# Patient Record
Sex: Female | Born: 1950 | Race: Black or African American | Hispanic: No | Smoking: Never smoker
Health system: Southern US, Community
[De-identification: ages and names within clinical notes are randomized; demographics above are authoritative.]

## PROBLEM LIST (undated history)

## (undated) DIAGNOSIS — E119 Type 2 diabetes mellitus without complications: Secondary | ICD-10-CM

## (undated) DIAGNOSIS — Z972 Presence of dental prosthetic device (complete) (partial): Secondary | ICD-10-CM

## (undated) DIAGNOSIS — M1990 Unspecified osteoarthritis, unspecified site: Secondary | ICD-10-CM

## (undated) DIAGNOSIS — K219 Gastro-esophageal reflux disease without esophagitis: Secondary | ICD-10-CM

## (undated) DIAGNOSIS — I10 Essential (primary) hypertension: Secondary | ICD-10-CM

## (undated) DIAGNOSIS — E039 Hypothyroidism, unspecified: Secondary | ICD-10-CM

## (undated) DIAGNOSIS — Z973 Presence of spectacles and contact lenses: Secondary | ICD-10-CM

## (undated) DIAGNOSIS — IMO0001 Reserved for inherently not codable concepts without codable children: Secondary | ICD-10-CM

## (undated) DIAGNOSIS — Z87442 Personal history of urinary calculi: Secondary | ICD-10-CM

## (undated) DIAGNOSIS — E785 Hyperlipidemia, unspecified: Secondary | ICD-10-CM

## (undated) HISTORY — DX: Hypothyroidism, unspecified: E03.9

## (undated) HISTORY — DX: Gastro-esophageal reflux disease without esophagitis: K21.9

## (undated) HISTORY — PX: JOINT REPLACEMENT: SHX530

## (undated) HISTORY — DX: Essential (primary) hypertension: I10

## (undated) HISTORY — PX: POLYPECTOMY: SHX149

## (undated) HISTORY — DX: Hyperlipidemia, unspecified: E78.5

## (undated) HISTORY — DX: Unspecified osteoarthritis, unspecified site: M19.90

## (undated) HISTORY — PX: COLONOSCOPY: SHX174

## (undated) HISTORY — PX: THYROID LOBECTOMY: SHX420

## (undated) HISTORY — DX: Reserved for inherently not codable concepts without codable children: IMO0001

---

## 1997-07-30 ENCOUNTER — Other Ambulatory Visit: Admission: RE | Admit: 1997-07-30 | Discharge: 1997-07-30 | Payer: Self-pay | Admitting: Obstetrics and Gynecology

## 1997-08-08 ENCOUNTER — Other Ambulatory Visit: Admission: RE | Admit: 1997-08-08 | Discharge: 1997-08-08 | Payer: Self-pay | Admitting: Obstetrics and Gynecology

## 1997-12-09 ENCOUNTER — Inpatient Hospital Stay (HOSPITAL_COMMUNITY): Admission: RE | Admit: 1997-12-09 | Discharge: 1997-12-12 | Payer: Self-pay | Admitting: Obstetrics and Gynecology

## 1998-01-04 ENCOUNTER — Inpatient Hospital Stay (HOSPITAL_COMMUNITY): Admission: AD | Admit: 1998-01-04 | Discharge: 1998-01-04 | Payer: Self-pay | Admitting: *Deleted

## 1998-03-14 HISTORY — PX: ABDOMINAL HYSTERECTOMY: SHX81

## 1998-09-16 ENCOUNTER — Other Ambulatory Visit: Admission: RE | Admit: 1998-09-16 | Discharge: 1998-09-16 | Payer: Self-pay | Admitting: Obstetrics and Gynecology

## 2000-12-04 ENCOUNTER — Other Ambulatory Visit: Admission: RE | Admit: 2000-12-04 | Discharge: 2000-12-04 | Payer: Self-pay | Admitting: Obstetrics and Gynecology

## 2002-03-14 HISTORY — PX: LITHOTRIPSY: SUR834

## 2002-05-27 ENCOUNTER — Encounter: Payer: Self-pay | Admitting: Internal Medicine

## 2002-07-06 ENCOUNTER — Emergency Department (HOSPITAL_COMMUNITY): Admission: EM | Admit: 2002-07-06 | Discharge: 2002-07-06 | Payer: Self-pay | Admitting: Emergency Medicine

## 2002-07-06 ENCOUNTER — Encounter: Payer: Self-pay | Admitting: Emergency Medicine

## 2002-08-05 ENCOUNTER — Ambulatory Visit (HOSPITAL_BASED_OUTPATIENT_CLINIC_OR_DEPARTMENT_OTHER): Admission: RE | Admit: 2002-08-05 | Discharge: 2002-08-05 | Payer: Self-pay | Admitting: Urology

## 2002-08-12 ENCOUNTER — Emergency Department (HOSPITAL_COMMUNITY): Admission: EM | Admit: 2002-08-12 | Discharge: 2002-08-12 | Payer: Self-pay | Admitting: *Deleted

## 2002-10-03 ENCOUNTER — Ambulatory Visit (HOSPITAL_BASED_OUTPATIENT_CLINIC_OR_DEPARTMENT_OTHER): Admission: RE | Admit: 2002-10-03 | Discharge: 2002-10-03 | Payer: Self-pay | Admitting: Urology

## 2004-01-19 ENCOUNTER — Ambulatory Visit: Payer: Self-pay | Admitting: Endocrinology

## 2004-01-20 ENCOUNTER — Ambulatory Visit (HOSPITAL_COMMUNITY): Admission: RE | Admit: 2004-01-20 | Discharge: 2004-01-20 | Payer: Self-pay | Admitting: Endocrinology

## 2004-01-23 ENCOUNTER — Encounter (INDEPENDENT_AMBULATORY_CARE_PROVIDER_SITE_OTHER): Payer: Self-pay | Admitting: Specialist

## 2004-01-23 ENCOUNTER — Ambulatory Visit (HOSPITAL_COMMUNITY): Admission: RE | Admit: 2004-01-23 | Discharge: 2004-01-23 | Payer: Self-pay | Admitting: Endocrinology

## 2004-03-14 HISTORY — PX: THYROIDECTOMY: SHX17

## 2004-03-16 ENCOUNTER — Ambulatory Visit (HOSPITAL_COMMUNITY): Admission: RE | Admit: 2004-03-16 | Discharge: 2004-03-16 | Payer: Self-pay | Admitting: Endocrinology

## 2004-12-16 ENCOUNTER — Ambulatory Visit: Payer: Self-pay | Admitting: Internal Medicine

## 2005-06-14 ENCOUNTER — Emergency Department (HOSPITAL_COMMUNITY): Admission: EM | Admit: 2005-06-14 | Discharge: 2005-06-14 | Payer: Self-pay | Admitting: Emergency Medicine

## 2005-06-16 ENCOUNTER — Ambulatory Visit (HOSPITAL_COMMUNITY): Admission: AD | Admit: 2005-06-16 | Discharge: 2005-06-16 | Payer: Self-pay | Admitting: Urology

## 2006-02-28 ENCOUNTER — Ambulatory Visit: Payer: Self-pay | Admitting: Internal Medicine

## 2006-08-29 ENCOUNTER — Encounter: Payer: Self-pay | Admitting: Internal Medicine

## 2006-09-18 ENCOUNTER — Encounter (INDEPENDENT_AMBULATORY_CARE_PROVIDER_SITE_OTHER): Payer: Self-pay | Admitting: *Deleted

## 2006-09-25 ENCOUNTER — Telehealth: Payer: Self-pay | Admitting: Internal Medicine

## 2007-02-20 ENCOUNTER — Telehealth: Payer: Self-pay | Admitting: Internal Medicine

## 2007-02-21 ENCOUNTER — Encounter (INDEPENDENT_AMBULATORY_CARE_PROVIDER_SITE_OTHER): Payer: Self-pay | Admitting: *Deleted

## 2007-04-13 ENCOUNTER — Ambulatory Visit: Payer: Self-pay | Admitting: Internal Medicine

## 2007-04-13 DIAGNOSIS — E039 Hypothyroidism, unspecified: Secondary | ICD-10-CM

## 2007-04-13 DIAGNOSIS — G4700 Insomnia, unspecified: Secondary | ICD-10-CM

## 2007-04-13 DIAGNOSIS — M1990 Unspecified osteoarthritis, unspecified site: Secondary | ICD-10-CM | POA: Insufficient documentation

## 2007-04-17 ENCOUNTER — Encounter: Payer: Self-pay | Admitting: Internal Medicine

## 2007-04-19 LAB — CONVERTED CEMR LAB
AST: 19 units/L (ref 0–37)
Albumin: 3.6 g/dL (ref 3.5–5.2)
Alkaline Phosphatase: 72 units/L (ref 39–117)
Basophils Relative: 0.8 % (ref 0.0–1.0)
Chloride: 103 meq/L (ref 96–112)
Cholesterol: 215 mg/dL (ref 0–200)
Direct LDL: 150 mg/dL
Lymphocytes Relative: 40.9 % (ref 12.0–46.0)
MCV: 89 fL (ref 78.0–100.0)
Monocytes Absolute: 0.5 10*3/uL (ref 0.2–0.7)
Neutro Abs: 2.1 10*3/uL (ref 1.4–7.7)
RBC: 3.95 M/uL (ref 3.87–5.11)
RDW: 11.8 % (ref 11.5–14.6)
Sodium: 138 meq/L (ref 135–145)
TSH: 0.63 microintl units/mL (ref 0.35–5.50)
VLDL: 14 mg/dL (ref 0–40)
WBC: 4.8 10*3/uL (ref 4.5–10.5)

## 2007-08-28 ENCOUNTER — Emergency Department (HOSPITAL_COMMUNITY): Admission: EM | Admit: 2007-08-28 | Discharge: 2007-08-29 | Payer: Self-pay | Admitting: Emergency Medicine

## 2007-08-29 ENCOUNTER — Telehealth: Payer: Self-pay | Admitting: Internal Medicine

## 2007-08-30 ENCOUNTER — Telehealth: Payer: Self-pay | Admitting: Internal Medicine

## 2007-09-06 ENCOUNTER — Ambulatory Visit: Payer: Self-pay | Admitting: Internal Medicine

## 2007-09-12 ENCOUNTER — Telehealth: Payer: Self-pay | Admitting: Internal Medicine

## 2007-10-08 ENCOUNTER — Encounter: Payer: Self-pay | Admitting: Internal Medicine

## 2008-01-14 ENCOUNTER — Telehealth (INDEPENDENT_AMBULATORY_CARE_PROVIDER_SITE_OTHER): Payer: Self-pay | Admitting: *Deleted

## 2008-02-12 ENCOUNTER — Ambulatory Visit: Payer: Self-pay | Admitting: Internal Medicine

## 2008-02-13 ENCOUNTER — Telehealth (INDEPENDENT_AMBULATORY_CARE_PROVIDER_SITE_OTHER): Payer: Self-pay | Admitting: *Deleted

## 2008-02-18 ENCOUNTER — Ambulatory Visit: Payer: Self-pay | Admitting: Family Medicine

## 2008-02-18 ENCOUNTER — Encounter: Payer: Self-pay | Admitting: Internal Medicine

## 2008-02-23 ENCOUNTER — Ambulatory Visit: Payer: Self-pay | Admitting: Internal Medicine

## 2008-02-23 ENCOUNTER — Inpatient Hospital Stay (HOSPITAL_COMMUNITY): Admission: EM | Admit: 2008-02-23 | Discharge: 2008-02-25 | Payer: Self-pay | Admitting: Emergency Medicine

## 2008-02-23 ENCOUNTER — Telehealth: Payer: Self-pay | Admitting: Family Medicine

## 2008-02-23 ENCOUNTER — Ambulatory Visit: Payer: Self-pay | Admitting: Family Medicine

## 2008-02-25 ENCOUNTER — Encounter: Payer: Self-pay | Admitting: Internal Medicine

## 2008-02-26 ENCOUNTER — Telehealth (INDEPENDENT_AMBULATORY_CARE_PROVIDER_SITE_OTHER): Payer: Self-pay | Admitting: *Deleted

## 2008-02-27 ENCOUNTER — Encounter (INDEPENDENT_AMBULATORY_CARE_PROVIDER_SITE_OTHER): Payer: Self-pay | Admitting: *Deleted

## 2008-02-27 ENCOUNTER — Ambulatory Visit: Payer: Self-pay | Admitting: Internal Medicine

## 2008-02-27 DIAGNOSIS — R9389 Abnormal findings on diagnostic imaging of other specified body structures: Secondary | ICD-10-CM | POA: Insufficient documentation

## 2008-02-27 DIAGNOSIS — I10 Essential (primary) hypertension: Secondary | ICD-10-CM

## 2008-02-29 ENCOUNTER — Telehealth (INDEPENDENT_AMBULATORY_CARE_PROVIDER_SITE_OTHER): Payer: Self-pay | Admitting: *Deleted

## 2008-02-29 LAB — CONVERTED CEMR LAB
BUN: 22 mg/dL (ref 6–23)
Creatinine, Ser: 1.2 mg/dL (ref 0.4–1.2)
GFR calc non Af Amer: 49 mL/min
Sodium: 139 meq/L (ref 135–145)

## 2008-06-06 ENCOUNTER — Telehealth: Payer: Self-pay | Admitting: Internal Medicine

## 2008-06-17 ENCOUNTER — Telehealth (INDEPENDENT_AMBULATORY_CARE_PROVIDER_SITE_OTHER): Payer: Self-pay | Admitting: *Deleted

## 2008-10-08 ENCOUNTER — Encounter: Payer: Self-pay | Admitting: Internal Medicine

## 2008-10-29 ENCOUNTER — Encounter (INDEPENDENT_AMBULATORY_CARE_PROVIDER_SITE_OTHER): Payer: Self-pay | Admitting: *Deleted

## 2009-02-18 ENCOUNTER — Encounter: Payer: Self-pay | Admitting: Internal Medicine

## 2009-02-24 ENCOUNTER — Telehealth (INDEPENDENT_AMBULATORY_CARE_PROVIDER_SITE_OTHER): Payer: Self-pay | Admitting: *Deleted

## 2009-03-03 ENCOUNTER — Encounter: Payer: Self-pay | Admitting: Internal Medicine

## 2009-08-03 ENCOUNTER — Telehealth (INDEPENDENT_AMBULATORY_CARE_PROVIDER_SITE_OTHER): Payer: Self-pay | Admitting: *Deleted

## 2009-08-06 ENCOUNTER — Telehealth (INDEPENDENT_AMBULATORY_CARE_PROVIDER_SITE_OTHER): Payer: Self-pay | Admitting: *Deleted

## 2009-09-01 ENCOUNTER — Ambulatory Visit: Payer: Self-pay | Admitting: Internal Medicine

## 2009-09-02 ENCOUNTER — Encounter (INDEPENDENT_AMBULATORY_CARE_PROVIDER_SITE_OTHER): Payer: Self-pay | Admitting: *Deleted

## 2009-09-08 LAB — CONVERTED CEMR LAB
AST: 23 units/L (ref 0–37)
BUN: 14 mg/dL (ref 6–23)
Basophils Relative: 1.7 % (ref 0.0–3.0)
Calcium: 9.3 mg/dL (ref 8.4–10.5)
Direct LDL: 171.3 mg/dL
Glucose, Bld: 82 mg/dL (ref 70–99)
HCT: 34.4 % — ABNORMAL LOW (ref 36.0–46.0)
HDL: 52.2 mg/dL (ref >39.00)
Lymphocytes Relative: 45.2 % (ref 12.0–46.0)
MCHC: 34.1 g/dL (ref 30.0–36.0)
MCV: 92 fL (ref 78.0–100.0)
Monocytes Absolute: 0.4 10*3/uL (ref 0.1–1.0)
Neutrophils Relative %: 40.9 % — ABNORMAL LOW (ref 43.0–77.0)
Potassium: 4.2 meq/L (ref 3.5–5.1)
RDW: 13.4 % (ref 11.5–14.6)
Sodium: 142 meq/L (ref 135–145)
Total CHOL/HDL Ratio: 4
Triglycerides: 96 mg/dL (ref 0.0–149.0)

## 2009-10-13 ENCOUNTER — Encounter: Payer: Self-pay | Admitting: Internal Medicine

## 2009-10-14 ENCOUNTER — Encounter (INDEPENDENT_AMBULATORY_CARE_PROVIDER_SITE_OTHER): Payer: Self-pay | Admitting: *Deleted

## 2009-10-19 ENCOUNTER — Ambulatory Visit: Payer: Self-pay | Admitting: Internal Medicine

## 2009-10-21 ENCOUNTER — Ambulatory Visit: Payer: Self-pay | Admitting: Internal Medicine

## 2009-10-23 ENCOUNTER — Encounter: Payer: Self-pay | Admitting: Internal Medicine

## 2009-10-23 LAB — CONVERTED CEMR LAB
ALT: 35 units/L (ref 0–35)
AST: 23 units/L (ref 0–37)
Total CHOL/HDL Ratio: 4
VLDL: 20 mg/dL (ref 0.0–40.0)

## 2009-10-30 ENCOUNTER — Ambulatory Visit: Payer: Self-pay | Admitting: Internal Medicine

## 2010-03-12 ENCOUNTER — Telehealth: Payer: Self-pay | Admitting: Internal Medicine

## 2010-04-03 ENCOUNTER — Encounter: Payer: Self-pay | Admitting: Internal Medicine

## 2010-04-04 ENCOUNTER — Encounter: Payer: Self-pay | Admitting: Urology

## 2010-04-13 NOTE — Letter (Signed)
Summary: [facility] [facility] Form/[facility]   Imported By: Lanelle Bal 11/02/2009 10:01:36  _____________________________________________________________________  External Attachment:    Type:   Image     Comment:   External Document

## 2010-04-13 NOTE — Miscellaneous (Signed)
Summary: recall colon--ch.  Clinical Lists Changes  Medications: Added new medication of MOVIPREP 100 GM  SOLR (PEG-KCL-NACL-NASULF-NA ASC-C) As directed - Signed Rx of MOVIPREP 100 GM  SOLR (PEG-KCL-NACL-NASULF-NA ASC-C) As directed;  #1 x 0;  Signed;  Entered by: Clide Cliff RN;  Authorized by: Hilarie Fredrickson MD;  Method used: Electronically to [facility] [address] [place], [place], [place]  [address], Ph: [phone], Fax: [phone] Observations: Added new observation of ALLERGY REV: Done (10/19/2009 8:46)    Prescriptions: MOVIPREP 100 GM  SOLR (PEG-KCL-NACL-NASULF-NA ASC-C) As directed  #1 x 0   Entered by:   Clide Cliff RN   Authorized by:   Hilarie Fredrickson MD   Signed by:   Clide Cliff RN on 10/19/2009   Method used:   Electronically to        [facility] (retail)       [address]       [place], [place]  [address]       Ph: [phone]       Fax: [phone]   RxID:   [id number]

## 2010-04-13 NOTE — Procedures (Signed)
Summary: Colonoscopy  Patient: Kelly Perkins Note: All result statuses are Final unless otherwise noted.  Tests: (1) Colonoscopy (COL)   COL Colonoscopy           DONE     [facility]     [address]     [place], [place]  [address]           COLONOSCOPY PROCEDURE REPORT           PATIENT:  [name], [name]  MR#:  [id number]     BIRTHDATE:  1950/06/19, 58 yrs. old  GENDER:  female     ENDOSCOPIST:  Wilhemina Bonito. Eda Keys, MD     REF. BY:  Surveillance Program Recall     PROCEDURE DATE:  10/30/2009     PROCEDURE:  Surveillance Colonoscopy     ASA CLASS:  Class II     INDICATIONS:  history of pre-cancerous (adenomatous) colon polyps,     surveillance and high-risk screening ; INDEX EXAM 11-2002 W/ SMALL     ADENOMA AND FAIR PREP. OVERDUE FOR FOLLOW UP     MEDICATIONS:   Fentanyl 100 mcg IV, Versed 10 mg IV           DESCRIPTION OF PROCEDURE:   After the risks benefits and     alternatives of the procedure were thoroughly explained, informed     consent was obtained.  Digital rectal exam was performed and     revealed no abnormalities.   The LB CF-[id number] endoscope     was introduced through the anus and advanced to the cecum, which     was identified by both the appendix and ileocecal valve, limited     by poor preparation. TIME TO CECUM = 4:16 MIN.   The quality of     the prep was poor, using MoviPrep.  The instrument was then slowly     withdrawn (T=7:14 MIN) as the colon was fully examined.     <<PROCEDUREIMAGES>>           FINDINGS:  The exam was complete to the cecal tip but Poor prep     limited this examination.The colon was filled with dense turbid     efluent and vegetative material. No bulky tumors seen. can not r/o     anything else.   Retroflexed views in the rectum revealed internal     hemorrhoids.    The scope was then withdrawn from the patient and     the procedure completed.           COMPLICATIONS:  None           ENDOSCOPIC IMPRESSION:     1) Poor  prep (exam limited as described)     2) Internal hemorrhoids           RECOMMENDATIONS:     1) Follow up colonoscopy WITHIN ONE YEAR WITH MORE VIGOROUS     PREP. RECOMMEND 2 DAYS OF CLEAR LIQUIDS ALONG WITH ONE BOTTLE OF     MAG CITRATE THE MORNING THE DAY BEFORE THE EXAM TO BE FOLLOWED BY     STANDARD MOVI PREP           ______________________________     Wilhemina Bonito. Eda Keys, MD           CC:  Willow Ora, MD; The Patient           n.     eSIGNED:   Wilhemina Bonito. Marina Goodell  Jr at 10/30/2009 10:13 AM           Kelly Perkins, [id number]  Note: An exclamation mark (!) indicates a result that was not dispersed into the flowsheet. Document Creation Date: 10/30/2009 10:14 AM _______________________________________________________________________  (1) Order result status: Final Collection or observation date-time: 10/30/2009 10:01 Requested date-time:  Receipt date-time:  Reported date-time:  Referring Physician:   Ordering Physician: Fransico Setters [phone]) Specimen Source:  Source: Launa Grill Order Number: [id number] Lab site:   Appended Document: Colonoscopy    Clinical Lists Changes  Observations: Added new observation of COLONNXTDUE: 10/2010 (10/30/2009 13:42)

## 2010-04-13 NOTE — Progress Notes (Signed)
Summary: Refill Request//Ambien  Phone Note Refill Request Call back at Work Phone [phone] Message from:  Pharmacy on Aug 06, 2009 10:23 AM  Refills Requested: Medication #1:  AMBIEN 10 MG TABS 1 by mouth qhs [facility] [address]. Patient does have a cpx scheduled on 6.21.11 now.   Next Appointment Scheduled: 6.21.11 Initial call taken by: Harold Barban,  Aug 06, 2009 10:24 AM  Follow-up for Phone Call        last refill #90 x 0 on 02/25/09,  last ov 02/27/08. Pt has appt sched 09/01/09 .Kandice Hams  Aug 07, 2009 10:20 AM  Follow-up by: Kandice Hams,  Aug 07, 2009 10:20 AM  Additional Follow-up for Phone Call Additional follow up Details #1::        patient is going out of town - needs refill today - please call when called in Additional Follow-up by: Okey Regal Spring,  Aug 07, 2009 11:26 AM    Additional Follow-up for Phone Call Additional follow up Details #2::    ok 90, no RF Jose E. Paz MD  Aug 07, 2009 3:08 PM   RX FAXED LEFT MSG FOR PATIENT.Kandice Hams  Aug 07, 2009 3:17 PM  Follow-up by: Kandice Hams,  Aug 07, 2009 3:17 PM  Prescriptions: AMBIEN 10 MG TABS (ZOLPIDEM TARTRATE) 1 by mouth qhs  #90 x 0   Entered by:   Kandice Hams   Authorized by:   Nolon Rod. Paz MD   Signed by:   Kandice Hams on 08/07/2009   Method used:   Printed then faxed to ...       [facility] (retail)       [address]       [place], [place]  [address]       Ph: [phone]       Fax: [phone]   RxID:   [id number]

## 2010-04-13 NOTE — Progress Notes (Signed)
Summary: CPX due   Phone Note Refill Request Message from:  Fax from Pharmacy on Aug 03, 2009 11:49 AM  Refills Requested: Medication #1:  AMBIEN 10 MG TABS 1 by mouth qhs [facility] [address] - fax [phone] - phone [phone]   Method Requested: Fax to Local Pharmacy Initial call taken by: Okey Regal Spring,  Aug 03, 2009 11:50 AM  Follow-up for Phone Call        last ov 02/27/2008, was rx'd #90 with no refills on 02/25/09 RX DENIED Marisue Ivan, pls contact pt to schedule cpx .Marland KitchenMarland KitchenShary Decamp  Aug 03, 2009 2:29 PM   Additional Follow-up for Phone Call Additional follow up Details #1::        [email or web address].Harold Barban  Aug 03, 2009 3:18 PM    Additional Follow-up for Phone Call Additional follow up Details #2::    Patient has an appt on 6.21.11 Follow-up by: Harold Barban,  Aug 04, 2009 11:21 AM

## 2010-04-13 NOTE — Assessment & Plan Note (Signed)
Summary: cpx//lch   Vital Signs:  Patient profile:   60 year old female Height:      63 inches Weight:      223 pounds BMI:     39.65 Temp:     98.0 degrees F oral Pulse rate:   66 / minute BP sitting:   118 / 90  (left arm)  Vitals Entered By: Jeremy Johann CMA (September 01, 2009 10:24 AM) CC: cpx Comments --fasting --no pap REVIEWED MED LIST, PATIENT AGREED DOSE AND INSTRUCTION CORRECT    History of Present Illness: CPX c/o pain in the left knee and hip, also in the hands. No redness or swelling in the hand joints, no a.m. stiffness. Celebrex use to help, refill? Ambien works, needs a refill  Allergies (verified): No Known Drug Allergies  Past History:  Past Medical History: Reviewed history from 02/27/2008 and no changes required. G2 P2 urolthiasis 2004--lithotripsy Osteoarthritis Hypothyroidism,  sees endocrinology Hypertension  Past Surgical History: Hysterectomy, B Oophorectomy Thyroidectomy  Family History: Reviewed history from 04/13/2007 and no changes required. CAD - M onset? HTN - M, sister DM - no breast Ca - no colon ca - no stroke - no  Social History: Single mom is one of my patients [name] 2 kids tobacco--no ETOH-- socially  diet- "not good" exercise-- walks daily , 2 miles   Review of Systems General:  Denies fatigue, fever, and weight loss. CV:  Denies chest pain or discomfort, palpitations, and swelling of feet. Resp:  Denies cough and shortness of breath. GI:  Denies bloody stools, nausea, and vomiting. GU:  Denies dysuria and hematuria. Psych:  Denies anxiety and depression.  Physical Exam  General:  alert, well-developed, and overweight-appearing.   Neck:  no masses, no thyromegaly, and normal carotid upstroke.   Lungs:  normal respiratory effort, no intercostal retractions, no accessory muscle use, and normal breath sounds.   Heart:  normal rate, regular rhythm, and no murmur.   Abdomen:  soft, non-tender, normal  bowel sounds, no distention, no masses, no guarding, and no rigidity.   Extremities:  no edema Knee deformities consistent with OA Neurologic:  alert & oriented X3, strength normal in all extremities, and gait normal.   Psych:  Cognition and judgment appear intact. Alert and cooperative with normal attention span and concentration.  not anxious appearing and not depressed appearing.     Impression & Recommendations:  Problem # 1:  HEALTH SCREENING (ICD-V70.0)  Td 09 pt reports shee sees Gyn routinely, they f/u on DEXAs last MMG 7-10  Cscope 2004, Bx adenomatous polyp (Dr Marina Goodell) , was due for cscope 9-09---- referal  discussed her BMI, need for diet and exercise. Weight watchers? Optifast?    Orders: Venipuncture ([address]) TLB-BMP (Basic Metabolic Panel-BMET) (80048-METABOL) TLB-CBC Platelet - w/Differential (85025-CBCD) TLB-Lipid Panel (80061-LIPID) TLB-Hepatic/Liver Function Pnl (80076-HEPATIC) Gastroenterology Referral (GI)  Problem # 2:  OSTEOARTHRITIS (ICD-715.90) ok to refill Celebrex   Her updated medication list for this problem includes:    Celebrex 200 Mg Caps (Celecoxib) ..... One by mouth daily. take with food  Problem # 3:  INSOMNIA, CHRONIC (ICD-307.42) refill Ambien  Complete Medication List: 1)  Ambien 10 Mg Tabs (Zolpidem tartrate) .Marland Kitchen.. 1 by mouth qhs 2)  Synthroid 150 Mcg Tabs (Levothyroxine sodium) .... 2 by mouth once daily 3)  Celebrex 200 Mg Caps (Celecoxib) .... One by mouth daily. take with food  Patient Instructions: 1)  Please schedule a follow-up appointment in 1 year.  2)  take  Celebrex as needed for pain, take it with food, watch for stomach irritation Prescriptions: AMBIEN 10 MG TABS (ZOLPIDEM TARTRATE) 1 by mouth qhs  #90 x 1   Entered and Authorized by:   Elita Quick E. Jeffery Gammell MD   Signed by:   Nolon Rod. Caulder Wehner MD on 09/01/2009   Method used:   Print then Give to Patient   RxID:   [id number] CELEBREX 200 MG CAPS (CELECOXIB) one by mouth daily. Take  with food  #90 x 1   Entered and Authorized by:   Nolon Rod. Laylana Gerwig MD   Signed by:   Nolon Rod. Natha Guin MD on 09/01/2009   Method used:   Print then Give to Patient   RxID:   [id number]

## 2010-04-13 NOTE — Letter (Signed)
Summary: [facility] Instructions  [facility]  [address] [place], [place] [address]   Phone: [phone]  Fax: [phone]       CATHI HAZAN    1950-04-16    MRN: [id number]        Procedure Day Dorna Bloom:  Farrell Ours  10/30/09     Arrival Time:  8:00AM     Procedure Time:  9:00AM     Location of Procedure:                    [facility]ian[facility] Pa[facility]  [facility] (4th Floor)                        PREPARATION FOR COLONOSCOPY WITH MOVIPREP   Starting 5 days prior to your procedure 10/25/09 do not eat nuts, seeds, popcorn, corn, beans, peas,  salads, or any raw vegetables.  Do not take any fiber supplements (e.g. Metamucil, Citrucel, and Benefiber).  THE DAY BEFORE YOUR PROCEDURE         DATE: 10/29/09  DAY: THURSDAY  1.  Drink clear liquids the entire day-NO SOLID FOOD  2.  Do not drink anything colored red or purple.  Avoid juices with pulp.  No orange juice.  3.  Drink at least 64 oz. (8 glasses) of fluid/clear liquids during the day to prevent dehydration and help the prep work efficiently.  CLEAR LIQUIDS INCLUDE: Water Jello Ice Popsicles Tea (sugar ok, no milk/cream) Powdered fruit flavored drinks Coffee (sugar ok, no milk/cream) Gatorade Juice: apple, white grape, white cranberry  Lemonade Clear bullion, consomm, broth Carbonated beverages (any kind) Strained chicken noodle soup Hard Candy                             4.  In the morning, mix first dose of MoviPrep solution:    Empty 1 Pouch A and 1 Pouch B into the disposable container    Add lukewarm drinking water to the top line of the container. Mix to dissolve    Refrigerate (mixed solution should be used within 24 hrs)  5.  Begin drinking the prep at 5:00 p.m. The MoviPrep container is divided by 4 marks.   Every 15 minutes drink the solution down to the next mark (approximately 8 oz) until the full liter is complete.   6.  Follow completed prep with 16 oz of clear liquid of your choice (Nothing  red or purple).  Continue to drink clear liquids until bedtime.  7.  Before going to bed, mix second dose of MoviPrep solution:    Empty 1 Pouch A and 1 Pouch B into the disposable container    Add lukewarm drinking water to the top line of the container. Mix to dissolve    Refrigerate  THE DAY OF YOUR PROCEDURE      DATE: 10/30/09  DAY: FRIDAY  Beginning at 4:00AM (5 hours before procedure):         1. Every 15 minutes, drink the solution down to the next mark (approx 8 oz) until the full liter is complete.  2. Follow completed prep with 16 oz. of clear liquid of your choice.    3. You may drink clear liquids until 7:00AM (2 HOURS BEFORE PROCEDURE).   MEDICATION INSTRUCTIONS  Unless otherwise instructed, you should take regular prescription medications with a small sip of water   as early as possible the morning  of your procedure.           OTHER INSTRUCTIONS  You will need a responsible adult at least 62 years of age to accompany you and drive you home.   This person must remain in the waiting room during your procedure.  Wear loose fitting clothing that is easily removed.  Leave jewelry and other valuables at home.  However, you may wish to bring a book to read or  an iPod/MP3 player to listen to music as you wait for your procedure to start.  Remove all body piercing jewelry and leave at home.  Total time from sign-in until discharge is approximately 2-3 hours.  You should go home directly after your procedure and rest.  You can resume normal activities the  day after your procedure.  The day of your procedure you should not:   Drive   Make legal decisions   Operate machinery   Drink alcohol   Return to work  You will receive specific instructions about eating, activities and medications before you leave.    The above instructions have been reviewed and explained to me by   Clide Cliff, RN______________________    I fully understand and can  verbalize these instructions _____________________________ Date _________

## 2010-04-13 NOTE — Letter (Signed)
Summary: Previsit letter  [facility]  [address] [place], [place] [address]   Phone: [phone]  Fax: [phone]       09/02/2009 MRN: [id number]  [facility] [address] [place], [place]  [address]  Dear Ms. Renfroe,  Welcome to the Gastroenterology Division at [facility].    You are scheduled to see a nurse for your pre-procedure visit on 09-09-09 at 3:30p.m. on the 3rd floor at [facility], [address]  We ask that you try to arrive at our office 15 minutes prior to your appointment time to allow for check-in.  Your nurse visit will consist of discussing your medical and surgical history, your immediate family medical history, and your medications.    Please bring a complete list of all your medications or, if you prefer, bring the medication bottles and we will list them.  We will need to be aware of both prescribed and over the counter drugs.  We will need to know exact dosage information as well.  If you are on blood thinners (Coumadin, Plavix, Aggrenox, Ticlid, etc.) please call our office today/prior to your appointment, as we need to consult with your physician about holding your medication.   Please be prepared to read and sign documents such as consent forms, a financial agreement, and acknowledgement forms.  If necessary, and with your consent, a friend or relative is welcome to sit-in on the nurse visit with you.  Please bring your insurance card so that we may make a copy of it.  If your insurance requires a referral to see a specialist, please bring your referral form from your primary care physician.  No co-pay is required for this nurse visit.     If you cannot keep your appointment, please call [phone] to cancel or reschedule prior to your appointment date.  This allows [place] the opportunity to schedule an appointment for another patient in need of care.    Thank you for choosing [facility] for your medical  needs.  We appreciate the opportunity to care for you.  Please visit [place] at our website  to learn more about our practice.                     Sincerely.                                                                                                                   The Gastroenterology Division

## 2010-04-13 NOTE — Consult Note (Signed)
Summary: [facility]  [facility]   Imported By: Lanelle Bal 03/18/2009 13:15:35  _____________________________________________________________________  External Attachment:    Type:   Image     Comment:   External Document

## 2010-04-15 NOTE — Progress Notes (Signed)
Summary: Zolpidem refill  Phone Note Refill Request Message from:  Fax from Pharmacy on March 12, 2010 12:51 PM  Refills Requested: Medication #1:  AMBIEN 10 MG TABS 1 by mouth qhs   Last Refilled: 02/02/2010 [id number], [address] [place], [place]  phone- [phone], fax [phone]   qty = 90  Next Appointment Scheduled: none Initial call taken by: Jerolyn Shin,  March 12, 2010 12:51 PM  Follow-up for Phone Call        last filled 09/01/09 x 1. #90 Follow-up by: Army Fossa CMA,  March 12, 2010 2:15 PM  Additional Follow-up for Phone Call Additional follow up Details #1::        90 and 1 RF Jose E. Paz MD  March 12, 2010 3:55 PM     Prescriptions: AMBIEN 10 MG TABS (ZOLPIDEM TARTRATE) 1 by mouth qhs  #90 x 1   Entered by:   Army Fossa CMA   Authorized by:   Nolon Rod. Paz MD   Signed by:   Army Fossa CMA on 03/12/2010   Method used:   Printed then faxed to ...       [facility] (retail)       [address]       [place], [place]  [address]       Ph: [phone]       Fax: [phone]   RxID:   [id number]

## 2010-07-13 ENCOUNTER — Telehealth: Payer: Self-pay | Admitting: *Deleted

## 2010-07-13 NOTE — Telephone Encounter (Signed)
Prior auth for celebrex and has been approved.

## 2010-07-27 NOTE — Discharge Summary (Signed)
NAME:  Kelly Perkins, Kelly Perkins                 ACCOUNT NO.:  [id number]   MEDICAL RECORD NO.:  [id number]          PATIENT TYPE:  INP   LOCATION:  1418                         FACILITY:  [facility]   PHYSICIAN:  Valerie A. Felicity Coyer, MDDATE OF BIRTH:  16-Aug-1950   DATE OF ADMISSION:  02/23/2008  DATE OF DISCHARGE:  02/25/2008                               DISCHARGE SUMMARY   DISCHARGE DIAGNOSES:  1. Chest pain in the setting of uncontrolled hypertension, now      resolved.  No evidence of acute coronary syndrome or arrhythmia.  2. Possible mild CHF due to uncontrolled hypertension.  A 2-D echo      performed with results pending at time of dictation.  Outpatient      follow-up with primary MD now on low-dose diuretic see below.  3. Uncontrolled hypertension, improved with beta blocker diuretic      treatment.  Outpatient follow-up with PCP blood pressure at      discharge was 143/85.  4. Hypothyroid was elevated TSH 89.46 this admission, increased      Synthroid dose to 300 mcg daily.  Outpatient follow up of TFTs in 4-      6 weeks.  5. Episode of abdominal pain 12:13 p.m. etiology unclear.  CT abdomen      and pelvis negative with no chemical abnormalities on labs and      symptoms resolved.   DISCHARGE MEDICATIONS:  1. Systolic 10 mg p.o. daily, dispensed 31 one refill.  2. Synthroid 300 mcg, dispense 31 one p.o. daily one refill.  3. HCTZ 25 mg tablets one p.o. daily dispense 31 one refill.   HOSPITAL FOLLOW-UP:  Scheduled with PCP in next 48 hours, Dr. Willow Ora  for Wednesday December 16 at 2:40 p.m. the patient is instructed that  she may increase activity slowly but may hold off and return to work  until after she sees Dr. Drue Novel on Wednesday afternoon, as she lifts heavy  boxes at [employer], and believes this is contributing to her  initial pain symptoms.  She is also instructed Dr. Drue Novel will review  results of electrocardiogram at the next visit and requested to bring  all of her  medications to visit with Dr. Drue Novel for review and to take as  prescribed.   CONDITION ON DISCHARGE:  Medically improved and stable, asymptomatic and  hemodynamically controlled.   HOSPITAL COURSE:  1. Chest pain with uncontrolled hypertension.  The patient is a 60-      year-old woman, not previously on antihypertensive medications by      her report, presented to the emergency room with 1-2 weeks of      standard substernal chest pain both with exertion and at rest.  In      the emergency room, this was associated with systolic blood      pressure greater than 200 and question of mild CHF by clinical      review.  She was admitted for telemetry observation, cardiac      cycling with serial enzymes and further observation.  She was  treated with IV Lopressor and also begun on HCTZ diuretics.  Course      by serial cardiac enzymes were negative for acute coronary      ischemia, and her symptoms resolved.  However, her blood pressure      remained uncontrolled with systolics in the 180s.  She was also      found to have abnormality of her TSH despite the fact that she was      noncompliant.  Please see details below.  Lopressor was changed to      oral Bystolic, and a 2-D echo was ordered for evaluation of      possible CHF in the setting of uncontrolled hypertension.      Echocardiogram will be done on this day prior to admission as she      clinically does not have pulmonary edema or peripheral symptoms I      feel it is safe to continue home antihypertensive regimen as      ordered herewith Bystolic and HCTZ with close outpatient follow-up      in the next 48 hours to review these results.  She has had no      further chest pain or palpitations but did experience an episode of      unexplained severe abdominal pain.  For admission for which a CT      abdomen and pelvis were ordered and negative.  CBC was normal at      4.9 and hemoglobin at this time was 14.0.  Hemodynamics were  not      compromised.  __________  symptoms at this time.  2. Hypothyroidism with elevated TSH.  The patient reports compliance      with 2 tablets of Synthroid 112 mcg daily which she has also been      __________increase.  However, her TSH remained nearly 90, checked      this hospitalization, as her Synthroid dose has been increased as      noted here to 300 mcg daily with outpatient follow up TFTs in next      4-6 weeks.  The patient is reminded of the importance __________and      states she will do this following discharge.   Greater than 30 minutes was spent on outpatient education coordination  of discharge planning and hospital follow-up as well as medication  review.      Valerie A. Felicity Coyer, MD  Electronically Signed     VAL/MEDQ  D:  02/25/2008  T:  02/25/2008  Job:  [id number]

## 2010-07-30 NOTE — Op Note (Signed)
   NAME:  Kelly Perkins, Kelly Perkins                           ACCOUNT NO.:  [id number]   MEDICAL RECORD NO.:  [id number]                   PATIENT TYPE:  AMB   LOCATION:  [facility]                                 FACILITY:  [facility]   PHYSICIAN:  Sigmund I. Patsi Sears, M.D.         DATE OF BIRTH:  09/02/1950   DATE OF PROCEDURE:  10/03/2002  DATE OF DISCHARGE:                                 OPERATIVE REPORT   PREOPERATIVE DIAGNOSIS:  Right lower ureteral calculus with colic.   POSTOPERATIVE DIAGNOSIS:  Right lower ureteral calculus with colic.   OPERATION/PROCEDURE:  1. Cystourethroscopy.  2. Right retrograde pyelogram.  3. Balloon dilation of the right lower ureter.  4. Ureteroscopy.  5. Basket extraction right lower ureteral stone.  6. Right double J catheter.   SURGEON:  Sigmund I. Patsi Sears, M.D.   ANESTHESIA:  General LMA.   PREPARATION:  After appropriate preanesthesia, the patient was brought to  the operating room and placed on the operating table in the dorsal supine  position with general LMA anesthesia induced.  She was then replaced in the  dorsal lithotomy position where the pubis was prepped with Betadine solution  and draped in the usual fashion.   DESCRIPTION OF PROCEDURE:  Cystoscopy revealed a normal pink bladder and KUB  revealed a stone in the right lower pelvis.  Right retrograde pyelogram  revealed the stone to be in the right lower ureter and 4 cm balloon dilation  was accomplished after passing a guide wire around the stone.  It was  difficult to pass the guide wire around the stone, because of the stone's  impaction.  However, following the softening of the guide wire, passing of  the wire was achieved and mini ureteroscopy was accomplished and the stone  was identified and basket extracted.  Because of the trauma of impaction of  the stone in the lower ureter, it was elected to place a double J catheter.  The double J catheter was tapped over the guide wire and coiled  in the renal  pelvis in the bladder and the patient was given IV Toradol, awakened and  taken to the recovery room in good condition.  She received a B&O  suppository at the end of the case.                                               Sigmund I. Patsi Sears, M.D.    SIT/MEDQ  D:  10/03/2002  T:  10/03/2002  Job:  [id number]

## 2010-07-30 NOTE — Op Note (Signed)
NAME:  Kelly Perkins, Kelly Perkins                 ACCOUNT NO.:  [id number]   MEDICAL RECORD NO.:  [id number]          PATIENT TYPE:  AMB   LOCATION:  DAY                          FACILITY:  [facility]   PHYSICIAN:  Lindaann Slough, M.D.  DATE OF BIRTH:  [birth year]   DATE OF PROCEDURE:  06/16/2005  DATE OF DISCHARGE:                                 OPERATIVE REPORT   PREOPERATIVE DIAGNOSIS:  Left ureteral stone with hydronephrosis.   POSTOPERATIVE DIAGNOSIS:  Left ureteral stone with hydronephrosis.   PROCEDURE:  Cystoscopy, left retrograde pyelogram, ureteroscopy, holmium  laser, left ureteral stone, stone extraction and insertion of double-J  catheter.   SURGEON:  Dr. Brunilda Payor.   ANESTHESIA:  General.   INDICATIONS:  The patient is a 60 year old female who was seen in the  emergency room 2 days ago with sudden onset of severe left flank pain  associated with nausea and vomiting.  A CT scan of the abdomen and pelvis  showed a 5 x 7 mm stone at the left UPJ.  The patient was then seen on the  same day by Dr. Patsi Sears and she was scheduled for ESL of the UPJ stone.  However, she returned to the office today complaining of severe left flank  pain.  A KUB showed that the stone was no longer present at the UPJ and the  stone could not be seen on KUB.  CT scan of the abdomen and pelvis showed a  5 x 7 mm stone at the lower level of the SI joint with moderate  hydronephrosis.  She is scheduled for cystoscopy and stone manipulation.   Under general anesthesia the patient was prepped and draped and placed in  the dorsolithotomy position.  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  The meatus was then dilated up to #30-  [place].  Then the cystoscope was passed in the bladder.  There was no stone  or tumor in the bladder.  The ureteral orifices are in normal position and  shape.   Retrograde pyelogram:   A cone-tip catheter was then passed through the cystoscope and  into the left  ureteral orifice.  Contrast was then injected through the cone-tip catheter  into the left ureter.  The distal ureter is normal and there is a filling  defect at the lower level of the SI joint and the proximal ureter is  moderately dilated.  The cone-tip catheter was removed.   A sensor guidewire was then passed through the cystoscope into the left  ureter and the cystoscope was removed.  A #6.5 French rigid ureteroscope was  then passed in the bladder and into the ureteral orifice, but the scope  could not be passed through the intramural ureter.  The ureteroscope was  then removed and a ureteroscope access sheath was passed over the guidewire  and the intramural ureter was then dilated.  The ureteroscope access sheath  was then removed. The ureteroscope was then passed in the bladder and the  ureter without difficulty and the stone was visualized at the junction  of  the distal and mid ureter.  With a 365 microfiber holmium laser the stone  was fragmented in multiple stone fragments.  The stone fragments were then  removed out of the ureter with the nitinol basket and dropped in the  bladder.  All large stone fragments were removed and there were  some  smaller fragments measuring less than 1 mm that were still present in the  ureter, but small enough to be passed spontaneously.   Retrograde Pyelogram:   Contrast was then injected through the ureteroscope.  There was no evidence  of extravasation of contrast from the ureter.  The ureteroscope was then  removed.  The guide wire was then back loaded into the cystoscope and a #6-  [place] - 24 double-J catheter was passed over the guidewire.  The proximal  curl of the double-J catheter is in the renal pelvis. The distal curl is in  the bladder.  The guidewire was then removed.  The stone fragments were then  irrigated out of the bladder with the [facility] syringe.  The bladder was then  emptied and the cystoscope was  removed.   The patient tolerated the procedure well and left the OR in satisfactory  condition to post anesthesia care unit.      Lindaann Slough, M.D.  Electronically Signed     MN/MEDQ  D:  06/16/2005  T:  06/17/2005  Job:  [id number]   cc:   Vonzell Schlatter. Patsi Sears, M.D.  Fax: [phone]

## 2010-08-01 ENCOUNTER — Other Ambulatory Visit: Payer: Self-pay | Admitting: Internal Medicine

## 2010-08-08 ENCOUNTER — Emergency Department (HOSPITAL_COMMUNITY): Payer: BC Managed Care – PPO

## 2010-08-08 ENCOUNTER — Observation Stay (HOSPITAL_COMMUNITY)
Admission: EM | Admit: 2010-08-08 | Discharge: 2010-08-10 | Disposition: A | Discharge status: Home / Self Care | Payer: BC Managed Care – PPO | Attending: Internal Medicine | Admitting: Internal Medicine

## 2010-08-08 DIAGNOSIS — D649 Anemia, unspecified: Secondary | ICD-10-CM | POA: Insufficient documentation

## 2010-08-08 DIAGNOSIS — R0609 Other forms of dyspnea: Secondary | ICD-10-CM | POA: Insufficient documentation

## 2010-08-08 DIAGNOSIS — R0989 Other specified symptoms and signs involving the circulatory and respiratory systems: Secondary | ICD-10-CM | POA: Insufficient documentation

## 2010-08-08 DIAGNOSIS — R079 Chest pain, unspecified: Principal | ICD-10-CM | POA: Insufficient documentation

## 2010-08-08 DIAGNOSIS — E039 Hypothyroidism, unspecified: Secondary | ICD-10-CM | POA: Insufficient documentation

## 2010-08-08 DIAGNOSIS — Z79899 Other long term (current) drug therapy: Secondary | ICD-10-CM | POA: Insufficient documentation

## 2010-08-08 DIAGNOSIS — E669 Obesity, unspecified: Secondary | ICD-10-CM | POA: Insufficient documentation

## 2010-08-08 DIAGNOSIS — E785 Hyperlipidemia, unspecified: Secondary | ICD-10-CM | POA: Insufficient documentation

## 2010-08-08 DIAGNOSIS — I10 Essential (primary) hypertension: Secondary | ICD-10-CM | POA: Insufficient documentation

## 2010-08-08 LAB — CBC
Hemoglobin: 11.5 g/dL — ABNORMAL LOW (ref 12.0–15.0)
Platelets: 209 10*3/uL (ref 150–400)
RBC: 3.8 MIL/uL — ABNORMAL LOW (ref 3.87–5.11)

## 2010-08-08 LAB — BASIC METABOLIC PANEL
GFR calc Af Amer: >60 mL/min (ref >60)
Glucose, Bld: 92 mg/dL (ref 70–99)
Potassium: 3.6 mEq/L (ref 3.5–5.1)

## 2010-08-08 LAB — DIFFERENTIAL
Basophils Absolute: 0 10*3/uL (ref 0.0–0.1)
Basophils Relative: 0 % (ref 0–1)
Lymphs Abs: 2.2 10*3/uL (ref 0.7–4.0)
Monocytes Absolute: 0.6 10*3/uL (ref 0.1–1.0)
Monocytes Relative: 11 % (ref 3–12)

## 2010-08-08 LAB — CK TOTAL AND CKMB (NOT AT ARMC)
CK, MB: 2.1 ng/mL (ref 0.3–4.0)
Relative Index: INVALID (ref 0.0–2.5)
Total CK: 95 U/L (ref 7–177)

## 2010-08-08 LAB — HEPATIC FUNCTION PANEL: Indirect Bilirubin: 0.2 mg/dL — ABNORMAL LOW (ref 0.3–0.9)

## 2010-08-09 LAB — COMPREHENSIVE METABOLIC PANEL
ALT: 17 U/L (ref 0–35)
AST: 16 U/L (ref 0–37)
Albumin: 3.2 g/dL — ABNORMAL LOW (ref 3.5–5.2)
Alkaline Phosphatase: 85 U/L (ref 39–117)
Chloride: 104 mEq/L (ref 96–112)
GFR calc Af Amer: >60 mL/min (ref >60)
Potassium: 3.6 mEq/L (ref 3.5–5.1)
Sodium: 138 mEq/L (ref 135–145)
Total Bilirubin: 0.4 mg/dL (ref 0.3–1.2)
Total Protein: 6.6 g/dL (ref 6.0–8.3)

## 2010-08-09 LAB — FOLATE: Folate: 8.9 ng/mL

## 2010-08-09 LAB — CBC
HCT: 33.1 % — ABNORMAL LOW (ref 36.0–46.0)
Hemoglobin: 11.4 g/dL — ABNORMAL LOW (ref 12.0–15.0)
MCH: 29.7 pg (ref 26.0–34.0)
MCV: 86.2 fL (ref 78.0–100.0)
Platelets: 200 10*3/uL (ref 150–400)
RBC: 3.84 MIL/uL — ABNORMAL LOW (ref 3.87–5.11)

## 2010-08-09 LAB — IRON AND TIBC: UIBC: 272 ug/dL

## 2010-08-09 LAB — TSH: TSH: 1.55 u[IU]/mL (ref 0.350–4.500)

## 2010-08-09 LAB — FERRITIN: Ferritin: 75 ng/mL (ref 10–291)

## 2010-08-09 LAB — CARDIAC PANEL(CRET KIN+CKTOT+MB+TROPI)
CK, MB: 1.7 ng/mL (ref 0.3–4.0)
Relative Index: INVALID (ref 0.0–2.5)

## 2010-08-10 ENCOUNTER — Observation Stay (HOSPITAL_COMMUNITY): Payer: BC Managed Care – PPO

## 2010-08-10 ENCOUNTER — Encounter: Payer: Self-pay | Admitting: Internal Medicine

## 2010-08-10 DIAGNOSIS — R079 Chest pain, unspecified: Secondary | ICD-10-CM

## 2010-08-10 LAB — CBC
MCHC: 34.1 g/dL (ref 30.0–36.0)
MCV: 86.7 fL (ref 78.0–100.0)
Platelets: 200 10*3/uL (ref 150–400)
RDW: 12.3 % (ref 11.5–15.5)
WBC: 5.5 10*3/uL (ref 4.0–10.5)

## 2010-08-10 LAB — COMPREHENSIVE METABOLIC PANEL
Albumin: 3.2 g/dL — ABNORMAL LOW (ref 3.5–5.2)
Alkaline Phosphatase: 88 U/L (ref 39–117)
BUN: 11 mg/dL (ref 6–23)
Calcium: 8.9 mg/dL (ref 8.4–10.5)
Glucose, Bld: 108 mg/dL — ABNORMAL HIGH (ref 70–99)
Potassium: 3.7 mEq/L (ref 3.5–5.1)
Sodium: 140 mEq/L (ref 135–145)
Total Protein: 6.7 g/dL (ref 6.0–8.3)

## 2010-08-10 LAB — CARDIAC PANEL(CRET KIN+CKTOT+MB+TROPI)
CK, MB: 1.2 ng/mL (ref 0.3–4.0)
Relative Index: INVALID (ref 0.0–2.5)
Troponin I: <0.3 ng/mL (ref <0.30)

## 2010-08-10 MED ORDER — TECHNETIUM TC 99M TETROFOSMIN IV KIT
30.0000 | PACK | Freq: Once | INTRAVENOUS | Status: AC | PRN
  Administered 2010-08-10: 30 via INTRAVENOUS

## 2010-08-10 MED ORDER — TECHNETIUM TC 99M TETROFOSMIN IV KIT
10.0000 | PACK | Freq: Once | INTRAVENOUS | Status: AC | PRN
  Administered 2010-08-10: 10 via INTRAVENOUS

## 2010-08-11 ENCOUNTER — Encounter: Payer: Self-pay | Admitting: Cardiovascular Disease

## 2010-08-11 ENCOUNTER — Ambulatory Visit (HOSPITAL_COMMUNITY): Payer: BC Managed Care – PPO

## 2010-08-11 ENCOUNTER — Ambulatory Visit (INDEPENDENT_AMBULATORY_CARE_PROVIDER_SITE_OTHER): Payer: BC Managed Care – PPO | Admitting: Internal Medicine

## 2010-08-11 ENCOUNTER — Encounter: Payer: Self-pay | Admitting: Internal Medicine

## 2010-08-11 DIAGNOSIS — R0609 Other forms of dyspnea: Secondary | ICD-10-CM

## 2010-08-11 DIAGNOSIS — D649 Anemia, unspecified: Secondary | ICD-10-CM | POA: Insufficient documentation

## 2010-08-11 DIAGNOSIS — R079 Chest pain, unspecified: Secondary | ICD-10-CM | POA: Insufficient documentation

## 2010-08-11 MED ORDER — CYCLOBENZAPRINE HCL 10 MG PO TABS: 10.0000 mg | ORAL_TABLET | Freq: Two times a day (BID) | ORAL | Status: DC | PRN

## 2010-08-11 MED ORDER — HYDROCODONE-ACETAMINOPHEN 5-500 MG PO TABS: 1.0000 | ORAL_TABLET | ORAL | Status: DC | PRN

## 2010-08-11 NOTE — Assessment & Plan Note (Addendum)
Chronic anemia with normal iron. Hemoglobin stable at around 11. She had a colonoscopy 10-30-09, poor  prep. Was recommended to repeat a colonoscopy in 2012 with a vigorous prep. She was recommended iron at the hospital, will discontinue it for now.

## 2010-08-11 NOTE — Telephone Encounter (Signed)
error 

## 2010-08-11 NOTE — Progress Notes (Signed)
  Subjective:    Patient ID: Kelly Perkins, female    DOB: Sep 22, 1950, 60 y.o.   MRN: [id number]  HPI Admitted 5-27 with chest pain. Chart is reviewed, there is no discharge summary so far: BMP, LFTs normal ; d-dimer negative, cardiac enzymes negative, TSH normal. Port. CXR neg Hemoglobin 11.6, slightly low. Iron was normal, B12 and folic acid normal. Iron saturation 16, slightly low. Had a Myoview 5-29   1. Small fixed defect within the apical segment of the anteroseptal   wall present on both rest and stress but larger on the stress.   Legrand Rams this to represent RV insertion rather than a focus of scar   with periinfarct ischemia.   2.  Normal wall motion.   3.  Left ventricular ejection fraction equal 59% Past Medical History  Diagnosis Date  . History of lithotripsy     urolthiasis 2004  . Osteoarthritis   . Hypothyroidism     sees endocrinology  . Hypertension    Past Surgical History  Procedure Date  . Hysterectomy (unknown)     B oophoreectomy  . Thyroidectomy     Family History: CAD - M onset? 50s? HTN - M, sister DM - no breast Ca - no colon ca - no stroke - no  Social History: Single mom is one of my patients [name] 2 kids tobacco--no ETOH-- socially  diet- "not good" exercise-- walks daily , 2 miles   Review of Systems Here for followup. She developed chest pain 5 days ago, pain is on and off, exacerbated by deep breathing or elevating her arms. Located anteriorly in the upper chest but also in the upper back. The pain does not have a tearing quality. She also developed dyspnea on exertion to otherwise regular activities 2 weeks ago. Today she reports that she is feeling about the same. Has  DOE. Denies any cough, wheezing, lower extremity edema. No history of asthma, does not smoke. Is not taking any new medicines.     Objective:   Physical Exam  Constitutional: She appears well-developed. No distress.  HENT:  Head: Normocephalic and  atraumatic.  Neck: No thyromegaly present.  Cardiovascular: Normal rate, regular rhythm and normal heart sounds.   No murmur heard. Pulmonary/Chest: Effort normal and breath sounds normal. No respiratory distress. She has no wheezes. She has no rales.       Slightly tender to palpation at the upper anterior chest, also mild tenderness at the upper posterior back.  Musculoskeletal: She exhibits no edema.       Range of motion of the shoulders is normal, she did  mention pain in the chest whenever I passively move her arms  Skin: She is not diaphoretic.          Assessment & Plan:

## 2010-08-11 NOTE — Assessment & Plan Note (Addendum)
Unclear to me why she is having some dyspnea on exertion with normal activities. She has mild chronic anemia, that would not explain her symptoms. The d-dimer was negative. O2 sat today on room air 98% No history of asthma or smoking. No history of wheezing. Portable chest x-ray was unremarkable. Plan: Reassess in 4 weeks.

## 2010-08-11 NOTE — Assessment & Plan Note (Signed)
Chest pain is quite atypical for heart disease, her stress test was not completely normal and she was recommended to see cardiology. Plan: Continue with Celebrex Vicodin and Flexeril for treatment of chest pain (believed to be musculoskeletal) Refer to cardiology for an abnormal stress test. ER if symptoms of severe

## 2010-08-13 DIAGNOSIS — IMO0001 Reserved for inherently not codable concepts without codable children: Secondary | ICD-10-CM

## 2010-08-13 HISTORY — DX: Reserved for inherently not codable concepts without codable children: IMO0001

## 2010-08-13 NOTE — H&P (Signed)
NAME:  Kelly Perkins, Kelly Perkins                 ACCOUNT NO.:  [id number]  MEDICAL RECORD NO.:  [id number]           PATIENT TYPE:  E  LOCATION:  [facility]                         FACILITY:  [facility]  PHYSICIAN:  Eduard Clos, MDDATE OF BIRTH:  01-09-1951  DATE OF ADMISSION:  08/08/2010 DATE OF DISCHARGE:                             HISTORY & PHYSICAL   PRIMARY CARE PHYSICIAN:  Willow Ora, MD, of [place].  CHIEF COMPLAINT:  Chest pain.  HISTORY OF PRESENT ILLNESS:  This is a 60 year old female with known history of hyperlipidemia and hypothyroidism who has been experiencing chest pain over the last 2 days.  It is retrosternal, lasts for around 4- 5 minutes, pressure like, nonradiating.  Has mild shortness of breath when it happens.  Denies any fever or chills, cough, or phlegm.  Denies any exertional symptoms.  Denies any headache, visual symptom, dizziness, loss of consciousness, any focal deficit.  Denies any nausea, vomiting, abdominal pain, dysuria, discharge, diarrhea.  In the ER, the patient had EKG and cardiac enzymes, chest x-ray, all which were not showing anything acute.  The patient has been admitted for further workup.  PAST MEDICAL HISTORY: 1. Hyperlipidemia. 2. Hypothyroidism.  PAST SURGICAL HISTORY:  Hysterectomy.  MEDICATIONS ON ADMISSION:  To be verified includes simvastatin, Zolpidem, levothyroxine.  ALLERGIES:  No known drug allergies.  FAMILY HISTORY:  Significant for coronary artery disease.  SOCIAL HISTORY:  The patient denies smoking cigarettes, drinking alcohol, using illegal drugs.  REVIEW OF SYSTEMS:  As per the history of present illness, nothing else significant.  PHYSICAL EXAMINATION:  GENERAL:  The patient was examined at bedside, not in acute distress. VITAL SIGNS:  Blood pressure 120/74, pulse is 58 per minute, temperature 98.4, respirations 18 per minute, O2 sat 99%. HEENT: Anicteric.  No pallor.  No discharge from ears, eyes, nose,  or mouth. CHEST:  Bilateral air entry present.  No rhonchi, no crepitation. HEART:  S1 and S2 heard. ABDOMEN:  Soft, nontender.  Bowel sounds heard. CENTRAL NERVOUS SYSTEM:  The patient is alert, awake, oriented to time, place, and person, moves upper and lower extremities, 5/5. EXTREMITIES:  Peripheral pulses felt.  No edema.  LABORATORY DATA:  EKG shows normal sinus rhythm with nonspecific ST-T changes, heart rate is around 75 beats per minute. Chest x-ray shows normal chest. CBC:  WBCs 5.7, hemoglobin is 11.5, hematocrit is 33, platelets 209. Basic metabolic panel:  Sodium 137, potassium 3.6, chloride 104, carbon dioxide 24, glucose 92, BUN 14, creatinine 0.6, calcium 8.9.  CK is 95, MBs 2.1, troponin less than 0.3, BNP 17.6.  ASSESSMENT: 1. Chest pain, to rule out acute coronary syndrome. 2. History of hyperlipidemia. 3. History of hypothyroidism. 4. Anemia, normocytic, normochromic.  PLAN: 1. At this time, I will admit the patient to telemetry. 2. For her chest pain, we will place the patient on aspirin.  Also, we     will add Protonix and cycle cardiac markers, get a  2-D echo.  I am     going to check a D-dimer.  If the D-dimer is high, we will get a CT  angio chest to rule out PE. 3. For her hyperlipidemia and hypothyroidism, we will be continuing     her present medication.  We will be checking fasting lipid panel     and TSH. 4. At this time, in addition to D-dimer, I will be checking LFT,     lipase, and anemia panel. 5. Further recommendation as condition evolves.     Eduard Clos, MD     ANK/MEDQ  D:  08/08/2010  T:  08/08/2010  Job:  [id number]  cc:   Willow Ora, MD  Electronically Signed by Midge Minium MD on 08/13/2010 07:42:52 AM

## 2010-08-19 ENCOUNTER — Encounter: Payer: Self-pay | Admitting: Cardiovascular Disease

## 2010-08-19 ENCOUNTER — Ambulatory Visit (INDEPENDENT_AMBULATORY_CARE_PROVIDER_SITE_OTHER): Payer: BC Managed Care – PPO | Admitting: Cardiovascular Disease

## 2010-08-19 DIAGNOSIS — I10 Essential (primary) hypertension: Secondary | ICD-10-CM

## 2010-08-19 DIAGNOSIS — R079 Chest pain, unspecified: Secondary | ICD-10-CM

## 2010-08-19 NOTE — Progress Notes (Signed)
60 yo admitted 5/29 by hospitalist for atypical SSCP.  CRF;s elevated lipids.  R/O with normal ECG.  Myovue done and read by radiology as ? Small septal defect likely valve plane.  No ischemia EF 59%.  I reviewed the study and it is totally normal.  She has been on pain med and muscle relaxant with continued "pulling" in the chest and left upper trapezius area.  Pain is sharp, intermitant and not related to exertion.  No pleuritic componant.  Occasional dyspnea.  Reassured patient that pain was noncardiac and that all testing in hospital was normal.  No need for further w/u  Continue CRF modification and F/U Dr. Drue Novel.  Continue pain med and muscle relaxant  . ROS: Denies fever, malais, weight loss, blurry vision, decreased visual acuity, cough, sputum, SOB, hemoptysis, pleuritic pain, palpitaitons, heartburn, abdominal pain, melena, lower extremity edema, claudication, or rash.  All other systems reviewed and negative   General: Affect appropriate Healthy:  appears stated age HEENT: normal Neck supple with no adenopathy JVP normal no bruits no thyromegaly Lungs clear with no wheezing and good diaphragmatic motion Heart:  S1/S2 no murmur,rub, gallop or click PMI normal Abdomen: benighn, BS positve, no tenderness, no AAA no bruit.  No HSM or HJR Distal pulses intact with no bruits No edema Neuro non-focal Skin warm and dry No muscular weakness  Medications Current Outpatient Prescriptions  Medication Sig Dispense Refill  . aspirin 81 MG tablet Take 81 mg by mouth daily.        . celecoxib (CELEBREX) 200 MG capsule Take 200 mg by mouth daily. Take w/ food       . cyclobenzaprine (FLEXERIL) 10 MG tablet Take 1 tablet (10 mg total) by mouth 2 (two) times daily as needed for muscle spasms (And chest pain).  30 tablet  1  . estradiol (VIVELLE-DOT) 0.0375 MG/24HR Place 1 patch onto the skin 2 (two) times a week.        Marland Kitchen HYDROcodone-acetaminophen (VICODIN) 5-500 MG per tablet Take 1 tablet by  mouth every 4 (four) hours as needed for pain.  30 tablet  0  . ibuprofen (ADVIL,MOTRIN) 800 MG tablet 800 mg every 8 (eight) hours as needed.       Marland Kitchen levothyroxine (SYNTHROID, LEVOTHROID) 175 MCG tablet Take 175 mcg by mouth daily.        . pantoprazole (PROTONIX) 40 MG tablet Take 40 mg by mouth daily.        . simvastatin (ZOCOR) 20 MG tablet TAKE ONE TABLET BY MOUTH AT BEDTIME  30 tablet  1  . zolpidem (AMBIEN) 10 MG tablet Take 10 mg by mouth at bedtime as needed.        Marland Kitchen DISCONTD: levothyroxine (SYNTHROID) 150 MCG tablet Take 150 mcg by mouth daily.          Allergies Review of patient's allergies indicates no known allergies.  Family History: Family History  Problem Relation Age of Onset  . Hypertension Mother   . Coronary artery disease Mother     onset?  Marland Kitchen Hypertension Sister   . Diabetes Neg Hx   . Colon cancer Neg Hx   . Breast cancer Neg Hx   . Stroke Neg Hx     Social History: History   Social History  . Marital Status: Divorced    Spouse Name: N/A    Number of Children: N/A  . Years of Education: N/A   Occupational History  . Not on file.   Social  History Main Topics  . Smoking status: Never Smoker   . Smokeless tobacco: Not on file  . Alcohol Use:   . Drug Use:   . Sexually Active:    Other Topics Concern  . Not on file   Social History Narrative   Single, mom is one of my patients [name]. ETOH-socially. Diet is not good. Walks 2 miles daily     Electrocardiogram: 5/27  NSR 75 normal ECG  Assessment and Plan

## 2010-08-19 NOTE — Assessment & Plan Note (Signed)
Well controlled.  Continue current medications and low sodium Dash type diet.    

## 2010-08-19 NOTE — Assessment & Plan Note (Signed)
Non cardiac.  Normal ECG, normal myovue F/U Dr Drue Novel

## 2010-08-30 ENCOUNTER — Encounter: Payer: Self-pay | Admitting: Internal Medicine

## 2010-09-10 ENCOUNTER — Ambulatory Visit (INDEPENDENT_AMBULATORY_CARE_PROVIDER_SITE_OTHER): Payer: BC Managed Care – PPO | Admitting: Internal Medicine

## 2010-09-10 ENCOUNTER — Encounter: Payer: Self-pay | Admitting: Internal Medicine

## 2010-09-10 DIAGNOSIS — M1990 Unspecified osteoarthritis, unspecified site: Secondary | ICD-10-CM

## 2010-09-10 DIAGNOSIS — R079 Chest pain, unspecified: Secondary | ICD-10-CM

## 2010-09-10 DIAGNOSIS — E785 Hyperlipidemia, unspecified: Secondary | ICD-10-CM | POA: Insufficient documentation

## 2010-09-10 HISTORY — DX: Hyperlipidemia, unspecified: E78.5

## 2010-09-10 LAB — LIPID PANEL
Cholesterol: 161 mg/dL (ref 0–200)
LDL Cholesterol: 97 mg/dL (ref 0–99)
Triglycerides: 47 mg/dL (ref 0.0–149.0)

## 2010-09-10 MED ORDER — CYCLOBENZAPRINE HCL 10 MG PO TABS: 10.0000 mg | ORAL_TABLET | Freq: Two times a day (BID) | ORAL | Status: DC | PRN

## 2010-09-10 NOTE — Progress Notes (Signed)
  Subjective:    Patient ID: Kelly Perkins, female    DOB: [birth year], 60 y.o.   MRN: [id number]  HPI F/u on chest pain and dyspnea on exertion. Chest pain  has decreased significantly, and dyspnea on exertion resolved Cardiology note reviewed, and stress test was negative  Past Medical History  Diagnosis Date  . History of lithotripsy     urolthiasis 2004  . Osteoarthritis   . Hypothyroidism     sees endocrinology  . Hypertension   . Hyperlipidemia 09/10/2010  . Normal cardiac stress test 6-12    had CP-DOE    Past Surgical History  Procedure Date  . Hysterectomy (unknown)     B oophoreectomy  . Thyroidectomy     Review of Systems Doing well, needs refill on Flexeril. Has knee pain, still taking Advil twice a day and Vicodin sporadically. Denies any abdominal pain nausea. She is taking Protonix, does not have GERD symptoms.    Objective:   Physical Exam  Constitutional: She appears well-developed. No distress.  Cardiovascular: Normal rate, regular rhythm and normal heart sounds.   No murmur heard. Pulmonary/Chest: Effort normal and breath sounds normal. No respiratory distress. She has no wheezes.  Musculoskeletal: She exhibits no edema.          Assessment & Plan:

## 2010-09-10 NOTE — Assessment & Plan Note (Signed)
Resolved

## 2010-09-10 NOTE — Assessment & Plan Note (Addendum)
Resolving, pt feels flexeril helps, RF it. Call if problems D/c protonix

## 2010-09-10 NOTE — Assessment & Plan Note (Signed)
Started zocor 08-2009, labs

## 2010-09-10 NOTE — Assessment & Plan Note (Addendum)
Has knee pain, takes ibuprofen, Celebrex and Vicodin. Advised patient to d/c Motrin and continue with Celebrex. Watch for GI side effects from celebrex, okay to take 2 or 3 Vicodin daily if needed. Will call if abdominal pain, nausea or change in color his stools.

## 2010-09-20 ENCOUNTER — Other Ambulatory Visit: Payer: Self-pay | Admitting: Internal Medicine

## 2010-09-20 MED ORDER — ZOLPIDEM TARTRATE 10 MG PO TABS: 10.0000 mg | ORAL_TABLET | Freq: Every evening | ORAL | Status: DC | PRN

## 2010-09-20 NOTE — Telephone Encounter (Signed)
90, 1 RF

## 2010-10-09 ENCOUNTER — Other Ambulatory Visit: Payer: Self-pay | Admitting: Internal Medicine

## 2010-10-11 DIAGNOSIS — Z0279 Encounter for issue of other medical certificate: Secondary | ICD-10-CM

## 2010-10-14 NOTE — Discharge Summary (Signed)
  NAMEMarland Kitchen  Perkins, Kelly                 ACCOUNT NO.:  [id number]  MEDICAL RECORD NO.:  [id number]  LOCATION:  2009                         FACILITY:  [facility]  PHYSICIAN:  Caress Reffitt Bosie Helper, MD      DATE OF BIRTH:  09/08/1950  DATE OF ADMISSION:  08/08/2010 DATE OF DISCHARGE:  08/10/2010                              DISCHARGE SUMMARY   DISCHARGE DIAGNOSES: 1. Chest pain, status post stress test, which did show small fix     defect within the apical segment of the anteroseptal wall.  This is     favored to represent artifact rather than a focus of a scar or     ischemia, ejection fraction of 59 and cardiac enzyme was negative. 2. Obesity. 3. Hyperlipidemia. 4. Hypothyroidism.  PROCEDURES: 1. Chest x-ray:  Normal chest. 2. Stress test, read by the Radiology.  Small fixed defect within the     apical segment of the anterior septal wall present on both rest and     stress but larger on the stress favored to represent artifact     rather than a focus of a scar.  HISTORY OF PRESENT ILLNESS:  This is a 60 year old African American female, who has a history of hyperlipidemia and hypothyroidism, presented with chest pain, retrosternal, lasted for around 4-5 minutes, pressure like, not radiating, associated with shortness of breath.  EKG showed normal sinus rhythm and cardiac enzyme was negative.  Chest x-ray did not show any acute events.  The patient was admitted for further workup of chest pain.  MI was ruled out by cardiac enzyme.  Secondary to the repeat of the pain, the patient underwent a stress test.  Report as above.  Case discussed with the Radiology and with Cardiology on-call, and likely this represents artifact, but in any circumstances, appointment made to see [facility] on June 7 at 9:45 a.m.  Her lipid profile within normal.  TSH within normal.  During hospital stay, her pain relieved with pain medications.  DISCHARGE MEDICATION: 1. Aspirin 81 mg p.o. daily. 2. Ferrous  sulfate 325 mg twice daily. 3. Protonix 40 mg p.o. daily. 4. Levothyroxine 175 mcg daily. 5. Zocor 20 mg daily. 6. Zolpidem 10 mg at bedtime.     Talon Regala Bosie Helper, MD     HIE/MEDQ  D:  10/14/2010  T:  10/14/2010  Job:  [id number]  cc:   Willow Ora, MD  Electronically Signed by Ebony Cargo MD on 10/14/2010 11:41:39 AM

## 2010-10-25 ENCOUNTER — Encounter: Payer: Self-pay | Admitting: Internal Medicine

## 2010-11-25 ENCOUNTER — Encounter: Payer: Self-pay | Admitting: Internal Medicine

## 2010-12-09 LAB — POCT I-STAT, CHEM 8
Chloride: 106
HCT: 40
Potassium: 3.5
Sodium: 139

## 2010-12-09 LAB — URINALYSIS, ROUTINE W REFLEX MICROSCOPIC
Glucose, UA: NEGATIVE
Nitrite: NEGATIVE
Specific Gravity, Urine: 1.016
pH: 8.5 — ABNORMAL HIGH

## 2010-12-09 LAB — DIFFERENTIAL
Basophils Absolute: 0
Lymphocytes Relative: 6 — ABNORMAL LOW
Lymphs Abs: 0.6 — ABNORMAL LOW
Neutro Abs: 7.8 — ABNORMAL HIGH

## 2010-12-09 LAB — CBC
Hemoglobin: 12.8
Platelets: 201
RDW: 13
WBC: 8.9

## 2010-12-16 LAB — CBC
Hemoglobin: 13 g/dL (ref 12.0–15.0)
RBC: 3.99 MIL/uL (ref 3.87–5.11)
WBC: 4.9 10*3/uL (ref 4.0–10.5)

## 2010-12-16 LAB — POCT I-STAT, CHEM 8
BUN: 20 mg/dL (ref 6–23)
Calcium, Ion: 1.09 mmol/L — ABNORMAL LOW (ref 1.12–1.32)
Creatinine, Ser: 1.2 mg/dL (ref 0.4–1.2)
Hemoglobin: 12.9 g/dL (ref 12.0–15.0)
TCO2: 25 mmol/L (ref 0–100)

## 2010-12-16 LAB — CARDIAC PANEL(CRET KIN+CKTOT+MB+TROPI)
CK, MB: 3.3 ng/mL (ref 0.3–4.0)
Relative Index: 0.4 (ref 0.0–2.5)
Relative Index: 0.5 (ref 0.0–2.5)
Total CK: 1469 U/L — ABNORMAL HIGH (ref 7–177)
Total CK: 878 U/L — ABNORMAL HIGH (ref 7–177)
Troponin I: 0.01 ng/mL (ref 0.00–0.06)

## 2010-12-16 LAB — D-DIMER, QUANTITATIVE: D-Dimer, Quant: 0.31 ug/mL-FEU (ref 0.00–0.48)

## 2010-12-16 LAB — SEDIMENTATION RATE: Sed Rate: 39 mm/hr — ABNORMAL HIGH (ref 0–22)

## 2010-12-16 LAB — B-NATRIURETIC PEPTIDE (CONVERTED LAB): Pro B Natriuretic peptide (BNP): <30 pg/mL (ref 0.0–100.0)

## 2010-12-16 LAB — TSH: TSH: 89.461 u[IU]/mL — ABNORMAL HIGH (ref 0.350–4.500)

## 2011-01-09 ENCOUNTER — Other Ambulatory Visit: Payer: Self-pay | Admitting: Internal Medicine

## 2011-03-11 ENCOUNTER — Other Ambulatory Visit: Payer: Self-pay | Admitting: Internal Medicine

## 2011-03-22 ENCOUNTER — Other Ambulatory Visit: Payer: Self-pay | Admitting: Internal Medicine

## 2011-03-23 MED ORDER — PANTOPRAZOLE SODIUM 40 MG PO TBEC: 40.0000 mg | DELAYED_RELEASE_TABLET | Freq: Every day | ORAL | Status: DC

## 2011-03-23 NOTE — Telephone Encounter (Signed)
Rx sent 

## 2011-03-28 ENCOUNTER — Other Ambulatory Visit: Payer: Self-pay | Admitting: Internal Medicine

## 2011-03-28 MED ORDER — ZOLPIDEM TARTRATE 10 MG PO TABS: 10.0000 mg | ORAL_TABLET | Freq: Every evening | ORAL | Status: DC | PRN

## 2011-03-28 NOTE — Telephone Encounter (Signed)
Left Pt detail message Rx sent to pharmacy, and OV due now.

## 2011-03-28 NOTE — Telephone Encounter (Signed)
90, 0 RF Needs OV

## 2011-03-28 NOTE — Telephone Encounter (Signed)
Last OV 09-10-10, last filled 09-20-10 #90 1

## 2011-04-07 ENCOUNTER — Other Ambulatory Visit: Payer: Self-pay | Admitting: Internal Medicine

## 2011-04-07 NOTE — Telephone Encounter (Signed)
Last seen 09/10/10, follow up 04/21/11.  RX sent for 30 days.

## 2011-04-21 ENCOUNTER — Ambulatory Visit (INDEPENDENT_AMBULATORY_CARE_PROVIDER_SITE_OTHER): Payer: BC Managed Care – PPO | Admitting: Internal Medicine

## 2011-04-21 ENCOUNTER — Encounter: Payer: Self-pay | Admitting: Internal Medicine

## 2011-04-21 VITALS — BP 158/90 | HR 67 | Temp 98.2°F | Wt 215.0 lb

## 2011-04-21 DIAGNOSIS — E039 Hypothyroidism, unspecified: Secondary | ICD-10-CM

## 2011-04-21 DIAGNOSIS — M1990 Unspecified osteoarthritis, unspecified site: Secondary | ICD-10-CM

## 2011-04-21 DIAGNOSIS — I10 Essential (primary) hypertension: Secondary | ICD-10-CM

## 2011-04-21 DIAGNOSIS — E785 Hyperlipidemia, unspecified: Secondary | ICD-10-CM

## 2011-04-21 LAB — TSH: TSH: 0.66 u[IU]/mL (ref 0.35–5.50)

## 2011-04-21 MED ORDER — DICLOFENAC SODIUM 1 % TD GEL: 1.0000 "application " | Freq: Four times a day (QID) | TRANSDERMAL | Status: DC

## 2011-04-21 MED ORDER — SIMVASTATIN 20 MG PO TABS: 20.0000 mg | ORAL_TABLET | Freq: Every day | ORAL | Status: DC

## 2011-04-21 MED ORDER — ZOLPIDEM TARTRATE 10 MG PO TABS: 10.0000 mg | ORAL_TABLET | Freq: Every evening | ORAL | Status: DC | PRN

## 2011-04-21 MED ORDER — CYCLOBENZAPRINE HCL 10 MG PO TABS: 10.0000 mg | ORAL_TABLET | Freq: Two times a day (BID) | ORAL | Status: AC | PRN

## 2011-04-21 MED ORDER — CELECOXIB 200 MG PO CAPS: 200.0000 mg | ORAL_CAPSULE | Freq: Every day | ORAL | Status: DC

## 2011-04-21 MED ORDER — LEVOTHYROXINE SODIUM 175 MCG PO TABS: 175.0000 ug | ORAL_TABLET | Freq: Every day | ORAL | Status: DC

## 2011-04-21 NOTE — Patient Instructions (Addendum)
restar zocor asap Low salt diet, see below Check the  blood pressure 2 or 3 times a week, be sure it is less than 140/85. If it is consistently higher, let me know    3 to 4 Gram Sodium Diet, No Added Salt (NAS) A 3 to 4 gram sodium diet restricts the amount of sodium in the diet to no more than 3 to 4 g or 3000 to 4000 mg daily. Limiting the amount of sodium is often used to help lower blood pressure. It is important if you have heart, liver, or kidney problems. Many foods contain sodium for flavor and sometimes as a preservative. When the amount of sodium in a diet needs to be low, it is important to know what to look for when choosing foods and drinks. The following includes some information and guidelines to help make it easier for you to adapt to a low sodium diet. QUICK TIPS  Do not add salt to food.   Avoid convenience items and fast food.   Choose unsalted snack foods.   Buy lower sodium products, often labeled as "lower sodium" or "no salt added."   Check food labels to learn how much sodium is in 1 serving.   When eating at a restaurant, ask that your food be prepared with less salt or none, if possible.  READING FOOD LABELS FOR SODIUM INFORMATION The nutrition facts label is a good place to find how much sodium is in foods. Look for products with no more than 500 to 600 mg of sodium per meal and no more than 150 mg per serving. Remember that 3 to 4 g = 3000 to 4000 mg. The food label may also list foods as:  Sodium-free: Less than 5 mg in a serving.   Very low sodium: 35 mg or less in a serving.   Low-sodium: 140 mg or less in a serving.   Light in sodium: 50% less sodium in a serving. For example, if a food that usually has 300 mg of sodium is changed to become light in sodium, it will have 150 mg of sodium.   Reduced sodium: 25% less sodium in a serving. For example, if a food that usually has 400 mg of sodium is changed to reduced sodium, it will have 300 mg of sodium.   CHOOSING FOODS Grains  Avoid: Salted crackers and snack items. Bread stuffing and biscuit mixes. Seasoned rice or pasta mixes.   Choose: Unsalted snack items. English muffins, breads, and rolls. Homemade pancakes and waffles. Most cereals. Pasta.  Meats  Avoid: Salted, canned, smoked, spiced, pickled meats, including fish and poultry. Bacon, ham, sausage, cold cuts, hot dogs, anchovies.   Choose: Low-sodium canned tuna and salmon. Fresh or frozen meat, poultry, and fish.  Dairy  Avoid: Processed cheese and spreads. Cottage cheese. Buttermilk and condensed milk. Regular cheese.   Choose: Milk. Low-sodium cottage cheese. Yogurt. Sour cream. Low-sodium cheese.  Fruits and Vegetables  Avoid: Regular canned vegetables. Regular canned tomato sauce and paste. Frozen vegetables in sauces. Olives. Rosita Fire. Relishes. Sauerkraut.   Choose: Low-sodium canned vegetables. Low-sodium tomato sauce and paste. Frozen or fresh vegetables. Fresh and frozen fruit.  Condiments  Avoid: Canned and packaged gravies. Worcestershire sauce. Tartar sauce. Barbecue sauce. Soy sauce. Steak sauce. Ketchup. Onion, garlic, and table salt. Meat flavorings and tenderizers.   Choose: Fresh and dried herbs and spices. Low-sodium varieties of mustard and ketchup. Lemon juice. Tabasco sauce. Horseradish.  SAMPLE 3 TO 4 GRAM SODIUM MEAL  PLAN  Breakfast / Sodium (mg)  1 cup low-fat milk / 143 mg   2 slices whole-wheat toast / 270 mg   1 tbs heart-healthy margarine / 153 mg   1 hard-boiled egg / 139 mg  Lunch / Sodium (mg)  1 cup raw carrots / 76 mg    cup hummus / 298 mg   1 cup low-fat milk / 143 mg    cup red grapes / 2 mg   1 cup low-sodium chicken and rice soup / 480 mg   10 low-sodium saltine crackers / 191 mg  Dinner / Sodium (mg)  1 cup whole-wheat pasta / 2 mg   1 cup tomato sauce / 1178 mg   3 oz lean ground beef / 57 mg   1 small side salad (1 cup raw spinach leaves,  cup cucumber,   cup yellow bell pepper) / 25 mg   1 tsp ranch dressing / 144 mg  Snack / Sodium (mg)  1 slice cheddar cheese / 258 mg   1 medium apple / 1 mg  Nutrient Analysis  Calories: 2005   Protein: 85 g   Carbohydrate: 245 g   Fat: 78 g   Sodium: 3560 mg  Document Released: 02/28/2005 Document Revised: 11/10/2010 Document Reviewed: 06/01/2009 [facility] Patient Information 2012 [place], [place].

## 2011-04-21 NOTE — Assessment & Plan Note (Addendum)
On no meds but likes to keep celebrex prn, does not need hydrocodone anymore. Also request voltaren gel--- done

## 2011-04-21 NOTE — Assessment & Plan Note (Signed)
On no meds, BP elevated today, see instructions

## 2011-04-21 NOTE — Assessment & Plan Note (Signed)
Has not taken zocor in a while, [facility] , RTC fasting in 2 months for a CPX

## 2011-04-21 NOTE — Progress Notes (Signed)
  Subjective:    Patient ID: Kelly Perkins, female    DOB: Aug 28, 1950, 62 y.o.   MRN: [id number]  HPI Routine office visit, feeling well.  Her BP is noted to be elevated today, she admits that she is not eating very healthy, she checks her BP at home and the readings varies from normal to slt elevated    Past Medical History: G2P2 Hypertension Hyperlipidemia Hypothyroidism, sees endocrinology. urolthiasis 2004--lithotripsy Osteoarthritis Chest pain, DOE 08-2010, normal stress test  Past Surgical History: Hysterectomy, B Oophorectomy Thyroidectomy  Family History: CAD - M onset? HTN - M, sister DM - no breast Ca - no colon ca - no stroke - no  Social History: Single, 2 children mom is one of my patients Kelly Perkins tobacco--no ETOH-- socially  diet- "not good" exercise-- walks daily , 2 miles    Review of Systems Good compliance with Synthroid. As far as her arthritis, she ran out of medications a while back, would like to get some Celebrex in case she needs it. Does not need hydrocodone anymore. She also would like to try Voltaren gel. She ran out of Zocor while back, needs a new prescription. While she was taking it, there were no side effects   h    Objective:   Physical Exam  Constitutional: She is oriented to person, place, and time. She appears well-developed and well-nourished. No distress.  HENT:  Head: Normocephalic and atraumatic.  Cardiovascular: Normal rate and regular rhythm.   No murmur heard. Musculoskeletal: She exhibits no edema.  Neurological: She is alert and oriented to person, place, and time.  Skin: She is not diaphoretic.  Psychiatric: She has a normal mood and affect. Her behavior is normal. Judgment and thought content normal.      Assessment & Plan:

## 2011-04-21 NOTE — Assessment & Plan Note (Signed)
Due for labs

## 2011-04-22 ENCOUNTER — Encounter: Payer: Self-pay | Admitting: Internal Medicine

## 2011-04-25 ENCOUNTER — Encounter: Payer: Self-pay | Admitting: Internal Medicine

## 2011-06-27 ENCOUNTER — Ambulatory Visit (HOSPITAL_BASED_OUTPATIENT_CLINIC_OR_DEPARTMENT_OTHER): Admission: RE | Admit: 2011-06-27 | Payer: BC Managed Care – PPO | Source: Ambulatory Visit | Admitting: Specialist

## 2011-06-27 ENCOUNTER — Encounter (HOSPITAL_BASED_OUTPATIENT_CLINIC_OR_DEPARTMENT_OTHER): Admission: RE | Payer: Self-pay | Source: Ambulatory Visit

## 2011-06-27 SURGERY — EXCISION MASS
Anesthesia: Regional | Laterality: Left

## 2011-09-08 ENCOUNTER — Encounter (HOSPITAL_BASED_OUTPATIENT_CLINIC_OR_DEPARTMENT_OTHER): Payer: Self-pay | Admitting: *Deleted

## 2011-09-08 NOTE — Progress Notes (Signed)
No labs ordered dr Aurelio Jew No anesth labs needed

## 2011-09-11 ENCOUNTER — Other Ambulatory Visit: Payer: Self-pay | Admitting: Internal Medicine

## 2011-09-12 ENCOUNTER — Encounter (HOSPITAL_BASED_OUTPATIENT_CLINIC_OR_DEPARTMENT_OTHER)
Admission: RE | Disposition: A | Discharge status: Home / Self Care | Payer: Self-pay | Source: Ambulatory Visit | Attending: Specialist

## 2011-09-12 ENCOUNTER — Encounter (HOSPITAL_BASED_OUTPATIENT_CLINIC_OR_DEPARTMENT_OTHER): Payer: Self-pay | Admitting: Anesthesiology

## 2011-09-12 ENCOUNTER — Ambulatory Visit (HOSPITAL_BASED_OUTPATIENT_CLINIC_OR_DEPARTMENT_OTHER): Payer: BC Managed Care – PPO | Admitting: Anesthesiology

## 2011-09-12 ENCOUNTER — Ambulatory Visit (HOSPITAL_BASED_OUTPATIENT_CLINIC_OR_DEPARTMENT_OTHER)
Admission: RE | Admit: 2011-09-12 | Discharge: 2011-09-12 | Disposition: A | Discharge status: Home / Self Care | Payer: BC Managed Care – PPO | Source: Ambulatory Visit | Attending: Specialist | Admitting: Specialist

## 2011-09-12 ENCOUNTER — Encounter (HOSPITAL_BASED_OUTPATIENT_CLINIC_OR_DEPARTMENT_OTHER): Payer: Self-pay | Admitting: *Deleted

## 2011-09-12 DIAGNOSIS — M6740 Ganglion, unspecified site: Secondary | ICD-10-CM | POA: Insufficient documentation

## 2011-09-12 DIAGNOSIS — M1990 Unspecified osteoarthritis, unspecified site: Secondary | ICD-10-CM | POA: Insufficient documentation

## 2011-09-12 DIAGNOSIS — E785 Hyperlipidemia, unspecified: Secondary | ICD-10-CM | POA: Insufficient documentation

## 2011-09-12 DIAGNOSIS — E039 Hypothyroidism, unspecified: Secondary | ICD-10-CM | POA: Insufficient documentation

## 2011-09-12 DIAGNOSIS — I10 Essential (primary) hypertension: Secondary | ICD-10-CM | POA: Insufficient documentation

## 2011-09-12 HISTORY — PX: MASS EXCISION: SHX2000

## 2011-09-12 HISTORY — DX: Presence of spectacles and contact lenses: Z97.3

## 2011-09-12 HISTORY — DX: Presence of dental prosthetic device (complete) (partial): Z97.2

## 2011-09-12 LAB — POCT HEMOGLOBIN-HEMACUE: Hemoglobin: 11.4 g/dL — ABNORMAL LOW (ref 12.0–15.0)

## 2011-09-12 SURGERY — EXCISION MASS
Anesthesia: General | Site: Finger | Laterality: Left

## 2011-09-12 MED ORDER — LACTATED RINGERS IV SOLN
INTRAVENOUS | Status: DC
  Administered 2011-09-12: 08:00:00 via INTRAVENOUS

## 2011-09-12 MED ORDER — SIMVASTATIN 20 MG PO TABS: 20.0000 mg | ORAL_TABLET | Freq: Every day | ORAL | Status: DC

## 2011-09-12 MED ORDER — ONDANSETRON HCL 4 MG/2ML IJ SOLN
INTRAMUSCULAR | Status: DC | PRN
  Administered 2011-09-12: 4 mg via INTRAVENOUS

## 2011-09-12 MED ORDER — LIDOCAINE HCL (CARDIAC) 20 MG/ML IV SOLN
INTRAVENOUS | Status: DC | PRN
  Administered 2011-09-12: 50 mg via INTRAVENOUS

## 2011-09-12 MED ORDER — METOCLOPRAMIDE HCL 5 MG/ML IJ SOLN: 10.0000 mg | Freq: Once | INTRAMUSCULAR | Status: DC | PRN

## 2011-09-12 MED ORDER — FENTANYL CITRATE 0.05 MG/ML IJ SOLN
INTRAMUSCULAR | Status: DC | PRN
  Administered 2011-09-12: 50 ug via INTRAVENOUS

## 2011-09-12 MED ORDER — DEXAMETHASONE SODIUM PHOSPHATE 4 MG/ML IJ SOLN
INTRAMUSCULAR | Status: DC | PRN
  Administered 2011-09-12: 10 mg via INTRAVENOUS

## 2011-09-12 MED ORDER — FENTANYL CITRATE 0.05 MG/ML IJ SOLN
25.0000 ug | INTRAMUSCULAR | Status: DC | PRN
  Administered 2011-09-12: 50 ug via INTRAVENOUS

## 2011-09-12 MED ORDER — OXYCODONE HCL 5 MG PO TABS
5.0000 mg | ORAL_TABLET | Freq: Once | ORAL | Status: AC | PRN
  Administered 2011-09-12: 5 mg via ORAL

## 2011-09-12 MED ORDER — METOCLOPRAMIDE HCL 5 MG/ML IJ SOLN
INTRAMUSCULAR | Status: DC | PRN
  Administered 2011-09-12: 10 mg via INTRAVENOUS

## 2011-09-12 MED ORDER — PROPOFOL 10 MG/ML IV EMUL
INTRAVENOUS | Status: DC | PRN
  Administered 2011-09-12: 200 mg via INTRAVENOUS

## 2011-09-12 MED ORDER — CEFAZOLIN SODIUM-DEXTROSE 2-3 GM-% IV SOLR
2.0000 g | INTRAVENOUS | Status: AC
  Administered 2011-09-12: 2 g via INTRAVENOUS

## 2011-09-12 MED ORDER — MIDAZOLAM HCL 5 MG/5ML IJ SOLN
INTRAMUSCULAR | Status: DC | PRN
  Administered 2011-09-12: 1 mg via INTRAVENOUS

## 2011-09-12 MED ORDER — LIDOCAINE HCL (PF) 1 % IJ SOLN
INTRAMUSCULAR | Status: DC | PRN
  Administered 2011-09-12: 2 mL

## 2011-09-12 SURGICAL SUPPLY — 65 items
APL SKNCLS STERI-STRIP NONHPOA (GAUZE/BANDAGES/DRESSINGS)
BAG DECANTER FOR FLEXI CONT (MISCELLANEOUS) ×2 IMPLANT
BALL CTTN LRG ABS STRL LF (GAUZE/BANDAGES/DRESSINGS)
BANDAGE CONFORM 2  STR LF (GAUZE/BANDAGES/DRESSINGS) ×1 IMPLANT
BANDAGE ELASTIC 4 VELCRO ST LF (GAUZE/BANDAGES/DRESSINGS) IMPLANT
BANDAGE GAUZE ELAST BULKY 4 IN (GAUZE/BANDAGES/DRESSINGS) IMPLANT
BENZOIN TINCTURE PRP APPL 2/3 (GAUZE/BANDAGES/DRESSINGS) IMPLANT
BLADE KNIFE PERSONA 10 (BLADE) ×2 IMPLANT
BLADE KNIFE PERSONA 15 (BLADE) ×2 IMPLANT
BNDG COHESIVE 4X5 TAN STRL (GAUZE/BANDAGES/DRESSINGS) IMPLANT
CANISTER SUCTION 1200CC (MISCELLANEOUS) IMPLANT
CLEANER CAUTERY TIP 5X5 PAD (MISCELLANEOUS) ×1 IMPLANT
CLOTH BEACON ORANGE TIMEOUT ST (SAFETY) ×2 IMPLANT
COTTONBALL LRG STERILE PKG (GAUZE/BANDAGES/DRESSINGS) IMPLANT
COVER MAYO STAND STRL (DRAPES) ×2 IMPLANT
COVER TABLE BACK 60X90 (DRAPES) ×2 IMPLANT
CUFF TOURNIQUET SINGLE 24IN (TOURNIQUET CUFF) ×1 IMPLANT
DRAPE EXTREMITY T 121X128X90 (DRAPE) ×1 IMPLANT
DRAPE LAPAROSCOPIC ABDOMINAL (DRAPES) ×2 IMPLANT
DRAPE PED LAPAROTOMY (DRAPES) IMPLANT
DRAPE U-SHAPE 76X120 STRL (DRAPES) IMPLANT
DRSG PAD ABDOMINAL 8X10 ST (GAUZE/BANDAGES/DRESSINGS) IMPLANT
ELECT NDL TIP 2.8 STRL (NEEDLE) ×1 IMPLANT
ELECT NEEDLE TIP 2.8 STRL (NEEDLE) ×2 IMPLANT
ELECT REM PT RETURN 9FT ADLT (ELECTROSURGICAL) ×2
ELECTRODE REM PT RTRN 9FT ADLT (ELECTROSURGICAL) ×1 IMPLANT
FILTER 7/8 IN (FILTER) IMPLANT
GAUZE SPONGE 4X4 12PLY STRL LF (GAUZE/BANDAGES/DRESSINGS) IMPLANT
GAUZE SPONGE 4X4 16PLY XRAY LF (GAUZE/BANDAGES/DRESSINGS) IMPLANT
GAUZE XEROFORM 1X8 LF (GAUZE/BANDAGES/DRESSINGS) ×3 IMPLANT
GAUZE XEROFORM 5X9 LF (GAUZE/BANDAGES/DRESSINGS) IMPLANT
GLOVE BIO SURGEON STRL SZ 6.5 (GLOVE) ×2 IMPLANT
GLOVE BIOGEL M STRL SZ7.5 (GLOVE) ×2 IMPLANT
GLOVE BIOGEL PI IND STRL 8 (GLOVE) ×1 IMPLANT
GLOVE BIOGEL PI INDICATOR 8 (GLOVE) ×1
GLOVE ECLIPSE 7.0 STRL STRAW (GLOVE) ×2 IMPLANT
GOWN PREVENTION PLUS XLARGE (GOWN DISPOSABLE) IMPLANT
GOWN PREVENTION PLUS XXLARGE (GOWN DISPOSABLE) ×2 IMPLANT
NDL HYPO 25X1 1.5 SAFETY (NEEDLE) ×1 IMPLANT
NEEDLE HYPO 25X1 1.5 SAFETY (NEEDLE) ×2 IMPLANT
PACK BASIN DAY SURGERY FS (CUSTOM PROCEDURE TRAY) ×2 IMPLANT
PAD CLEANER CAUTERY TIP 5X5 (MISCELLANEOUS) ×1
PENCIL BUTTON HOLSTER BLD 10FT (ELECTRODE) ×2 IMPLANT
SHEET MEDIUM DRAPE 40X70 STRL (DRAPES) ×2 IMPLANT
SHEETING SILICONE GEL EPI DERM (MISCELLANEOUS) IMPLANT
SPONGE GAUZE 4X4 12PLY (GAUZE/BANDAGES/DRESSINGS) IMPLANT
SPONGE LAP 18X18 X RAY DECT (DISPOSABLE) ×2 IMPLANT
STAPLER VISISTAT 35W (STAPLE) IMPLANT
STOCKINETTE 4X48 STRL (DRAPES) IMPLANT
STRIP CLOSURE SKIN 1/2X4 (GAUZE/BANDAGES/DRESSINGS) IMPLANT
STRIP SUTURE WOUND CLOSURE 1/2 (SUTURE) IMPLANT
SUCTION FRAZIER TIP 10 FR DISP (SUCTIONS) IMPLANT
SUT MNCRL AB 3-0 PS2 18 (SUTURE) IMPLANT
SUT PROLENE 4 0 P 3 18 (SUTURE) IMPLANT
SUT PROLENE 4 0 PS 2 18 (SUTURE) IMPLANT
SUT SILK 3 0 PS 1 (SUTURE) IMPLANT
SYR CONTROL 10ML LL (SYRINGE) ×2 IMPLANT
TAPE HYPAFIX 6X30 (GAUZE/BANDAGES/DRESSINGS) IMPLANT
TOWEL OR 17X24 6PK STRL BLUE (TOWEL DISPOSABLE) ×4 IMPLANT
TRAY DSU PREP LF (CUSTOM PROCEDURE TRAY) ×2 IMPLANT
TUBE CONNECTING 20X1/4 (TUBING) IMPLANT
UNDERPAD 30X30 INCONTINENT (UNDERPADS AND DIAPERS) IMPLANT
VAC PENCILS W/TUBING CLEAR (MISCELLANEOUS) IMPLANT
WATER STERILE IRR 1000ML POUR (IV SOLUTION) ×2 IMPLANT
YANKAUER SUCT BULB TIP NO VENT (SUCTIONS) ×2 IMPLANT

## 2011-09-12 NOTE — Discharge Instructions (Signed)
Keep dressings dry   Until I see you in office on next  Monday   Post Anesthesia Home Care Instructions  Activity: Get plenty of rest for the remainder of the day. A responsible adult should stay with you for 24 hours following the procedure.  For the next 24 hours, DO NOT: -Drive a car -Advertising copywriter -Drink alcoholic beverages -Take any medication unless instructed by your physician -Make any legal decisions or sign important papers.  Meals: Start with liquid foods such as gelatin or soup. Progress to regular foods as tolerated. Avoid greasy, spicy, heavy foods. If nausea and/or vomiting occur, drink only clear liquids until the nausea and/or vomiting subsides. Call your physician if vomiting continues.  Special Instructions/Symptoms: Your throat may feel dry or sore from the anesthesia or the breathing tube placed in your throat during surgery. If this causes discomfort, gargle with warm salt water. The discomfort should disappear within 24 hours.

## 2011-09-12 NOTE — Anesthesia Postprocedure Evaluation (Signed)
Anesthesia Post Note  Patient: Kelly Perkins  Procedure(s) Performed: Procedure(s) (LRB): EXCISION MASS (Left)  Anesthesia type: General  Patient location: PACU  Post pain: Pain level controlled  Post assessment: Patient's Cardiovascular Status Stable  Last Vitals:  Filed Vitals:   09/12/11 1100  BP: 149/79  Pulse: 64  Temp:   Resp: 15    Post vital signs: Reviewed and stable  Level of consciousness: alert  Complications: No apparent anesthesia complications

## 2011-09-12 NOTE — Anesthesia Preprocedure Evaluation (Signed)
Anesthesia Evaluation  Patient identified by MRN, date of birth, ID band Patient awake    Reviewed: Allergy & Precautions, H&P , NPO status , Patient's Chart, lab work & pertinent test results, reviewed documented beta blocker date and time   Airway Mallampati: II TM Distance: >3 FB Neck ROM: full    Dental   Pulmonary neg pulmonary ROS,          Cardiovascular hypertension, On Medications     Neuro/Psych negative neurological ROS  negative psych ROS   GI/Hepatic negative GI ROS, Neg liver ROS,   Endo/Other  Morbid obesity  Renal/GU negative Renal ROS  negative genitourinary   Musculoskeletal   Abdominal   Peds  Hematology negative hematology ROS (+)   Anesthesia Other Findings See surgeon's H&P   Reproductive/Obstetrics negative OB ROS                           Anesthesia Physical Anesthesia Plan  ASA: III  Anesthesia Plan: General   Post-op Pain Management:    Induction: Intravenous  Airway Management Planned: LMA  Additional Equipment:   Intra-op Plan:   Post-operative Plan: Extubation in OR  Informed Consent: I have reviewed the patients History and Physical, chart, labs and discussed the procedure including the risks, benefits and alternatives for the proposed anesthesia with the patient or authorized representative who has indicated his/her understanding and acceptance.   Dental Advisory Given  Plan Discussed with: CRNA and Surgeon  Anesthesia Plan Comments:         Anesthesia Quick Evaluation  

## 2011-09-12 NOTE — Brief Op Note (Signed)
09/12/2011  9:53 AM  PATIENT:  Kelly Perkins  61 y.o. female  PRE-OPERATIVE DIAGNOSIS:  mass left ring finger  POST-OPERATIVE DIAGNOSIS:  mass left ring finger  PROCEDURE:  Procedure(s) (LRB): EXCISION MASS (Left)  SURGEON:  Surgeon(s) and Role:    * Louisa Second, MD - Primary  PHYSICIAN ASSISTANT:   ASSISTANTS: none   ANESTHESIA:   general  EBL:  Total I/O In: 700 [I.V.:700] Out: -   BLOOD ADMINISTERED:none  DRAINS: none   LOCAL MEDICATIONS USED:  LIDOCAINE   SPECIMEN:  Excision  DISPOSITION OF SPECIMEN:  PATHOLOGY  COUNTS:  YES  TOURNIQUET:   Total Tourniquet Time Documented: Upper Arm (Left) - 13 minutes  DICTATION: .Other Dictation: Dictation Number [id number] op number is [id number]  PLAN OF CARE: Discharge to home after PACU  PATIENT DISPOSITION:  PACU - hemodynamically stable.   Delay start of Pharmacological VTE agent (>24hrs) due to surgical blood loss or risk of bleeding: not applicable

## 2011-09-12 NOTE — Transfer of Care (Signed)
Immediate Anesthesia Transfer of Care Note  Patient: Kelly Perkins  Procedure(s) Performed: Procedure(s) (LRB): EXCISION MASS (Left)  Patient Location: PACU  Anesthesia Type: General  Level of Consciousness: awake, alert  and oriented  Airway & Oxygen Therapy: Patient Spontanous Breathing and Patient connected to face mask oxygen  Post-op Assessment: Report given to PACU RN and Post -op Vital signs reviewed and stable  Post vital signs: Reviewed and stable  Complications: No apparent anesthesia complications

## 2011-09-12 NOTE — H&P (Signed)
Kelly Perkins is an 61 y.o. female.   Chief Complaint: mass left ring finger HPI: increased growth and decreased range of motion  Past Medical History  Diagnosis Date  . History of lithotripsy     urolthiasis 2004  . Osteoarthritis   . Hypothyroidism     sees endocrinology  . Hyperlipidemia 09/10/2010  . Normal cardiac stress test 6-12    had CP-DOE  . Hypertension     no meds  . Wears glasses   . Wears dentures     top    Past Surgical History  Procedure Date  . Hysterectomy (unknown)     B oophoreectomy  . Thyroidectomy     Family History  Problem Relation Age of Onset  . Hypertension Mother   . Coronary artery disease Mother     onset?  Marland Kitchen Hypertension Sister   . Diabetes Neg Hx   . Colon cancer Neg Hx   . Breast cancer Neg Hx   . Stroke Neg Hx    Social History:  reports that she has never smoked. She does not have any smokeless tobacco history on file. She reports that she does not drink alcohol or use illicit drugs.  Allergies: No Known Allergies  Medications Prior to Admission  Medication Sig Dispense Refill  . aspirin 81 MG tablet Take 81 mg by mouth daily.        . celecoxib (CELEBREX) 200 MG capsule Take 1 capsule (200 mg total) by mouth daily. Take w/ food  90 capsule  1  . cyclobenzaprine (FLEXERIL) 10 MG tablet Take 1 tablet (10 mg total) by mouth 2 (two) times daily as needed for muscle spasms (And chest pain).  100 tablet  1  . diclofenac sodium (VOLTAREN) 1 % GEL Apply 1 application topically 4 (four) times daily.  200 g  6  . estradiol (VIVELLE-DOT) 0.0375 MG/24HR Place 1 patch onto the skin 2 (two) times a week.        . simvastatin (ZOCOR) 20 MG tablet Take 1 tablet (20 mg total) by mouth at bedtime.  90 tablet  2  . zolpidem (AMBIEN) 10 MG tablet Take 1 tablet (10 mg total) by mouth at bedtime as needed.  90 tablet  1    Results for orders placed during the hospital encounter of 09/12/11 (from the past 48 hour(s))  POCT HEMOGLOBIN-HEMACUE      Status: Abnormal   Collection Time   09/12/11  8:08 AM      Component Value Range Comment   Hemoglobin 11.4 (*) 12.0 - 15.0 g/dL    No results found.  Review of Systems  Constitutional: Negative.   HENT: Negative.   Eyes: Negative.   Respiratory: Negative.   Cardiovascular: Negative.   Gastrointestinal: Negative.   Genitourinary: Negative.   Musculoskeletal: Positive for joint pain.  Skin: Negative.   Neurological: Negative.   Endo/Heme/Allergies: Negative.   Psychiatric/Behavioral: Negative.     Blood pressure 166/102, pulse 75, temperature 98.1 F (36.7 C), temperature source Oral, resp. rate 18, height 5\' 5"  (1.651 m), weight 96.163 kg (212 lb), SpO2 96.00%. Physical Exam   Assessment/Plan excision  Tanna Loeffler L 09/12/2011, 8:56 AM

## 2011-09-12 NOTE — Telephone Encounter (Signed)
Refill done.  

## 2011-09-12 NOTE — Anesthesia Procedure Notes (Signed)
Procedure Name: LMA Insertion Performed by: Akirra Lacerda W Pre-anesthesia Checklist: Patient identified, Timeout performed, Emergency Drugs available, Suction available and Patient being monitored Patient Re-evaluated:Patient Re-evaluated prior to inductionOxygen Delivery Method: Circle system utilized Preoxygenation: Pre-oxygenation with 100% oxygen Intubation Type: IV induction Ventilation: Mask ventilation without difficulty LMA: LMA with gastric port inserted LMA Size: 4.0 Number of attempts: 1 Placement Confirmation: breath sounds checked- equal and bilateral and positive ETCO2 Tube secured with: Tape Dental Injury: Teeth and Oropharynx as per pre-operative assessment      

## 2011-09-13 NOTE — Op Note (Signed)
NAMEMarland Kitchen  SHYTERIA, LEWIS                 ACCOUNT NO.:  [id number]  MEDICAL RECORD NO.:  [id number]  LOCATION:                                 FACILITY:  PHYSICIAN:  Earvin Hansen L. Denesia Donelan, M.D.DATE OF BIRTH:  1950/11/12  DATE OF PROCEDURE:  09/12/2011 DATE OF DISCHARGE:                              OPERATIVE REPORT   This is a 61 year old lady with enlarging mass involving her left ring finger, increased growth, reduction of flexion, is at the mid-joint area.  PROCEDURES DONE:  Excision and closure.  FINDINGS:  Tendon sheath tumor.  ANESTHESIA:  General with tourniquet.  DESCRIPTION OF PROCEDURE:  The patient underwent general anesthesia and intubated orally.  Prep was done to the left hand and arm areas with Betadine soap and solution, walled off with sterile towels and drapes so as to make a sterile field.  The tourniquet was placed and placed to 250 mmHg.  After had been exsanguinated with an Esmarch, a curvilinear drawing was made over the crease line and an incision was made with #15 blade down the underlying skin and subcutaneous tissue to the retinaculum.  Retinacula was identified and dissection carried around that area with a mass with cystic wall.  Synovial excision was made of that and the mass was removed in its entirety.  Hemostasis was maintained with the Bovie on coagulation, low volume coag 20.  A small tributary was exposed and the wounds were closed with multiple sutures of 4-0 Prolene.  Finger dressing was applied including Xeroform, 4x4s, and a 2-inch Kling.  She was then taken to recovery in excellent condition.  Tourniquet was released, showing no obvious bleeding.     Yaakov Guthrie. Shon Hough, M.D.     Cathie Hoops  D:  09/12/2011  T:  09/12/2011  Job:  [id number]

## 2011-09-16 ENCOUNTER — Encounter (HOSPITAL_BASED_OUTPATIENT_CLINIC_OR_DEPARTMENT_OTHER): Payer: Self-pay | Admitting: Specialist

## 2011-10-24 ENCOUNTER — Encounter: Payer: Self-pay | Admitting: Internal Medicine

## 2011-12-10 ENCOUNTER — Other Ambulatory Visit: Payer: Self-pay | Admitting: Internal Medicine

## 2011-12-13 NOTE — Telephone Encounter (Signed)
LMOVM for pt to return call to very refill request.

## 2011-12-14 ENCOUNTER — Encounter: Payer: Self-pay | Admitting: Internal Medicine

## 2011-12-21 NOTE — Telephone Encounter (Signed)
Refill request sent in error.

## 2012-12-10 ENCOUNTER — Other Ambulatory Visit (HOSPITAL_COMMUNITY): Payer: Self-pay | Admitting: Nurse Practitioner

## 2012-12-10 ENCOUNTER — Ambulatory Visit (HOSPITAL_COMMUNITY)
Admission: RE | Admit: 2012-12-10 | Discharge: 2012-12-10 | Disposition: A | Discharge status: Home / Self Care | Payer: BC Managed Care – PPO | Source: Ambulatory Visit | Attending: Nurse Practitioner | Admitting: Nurse Practitioner

## 2012-12-10 DIAGNOSIS — M25561 Pain in right knee: Secondary | ICD-10-CM

## 2012-12-10 DIAGNOSIS — M1710 Unilateral primary osteoarthritis, unspecified knee: Secondary | ICD-10-CM | POA: Insufficient documentation

## 2013-02-14 ENCOUNTER — Other Ambulatory Visit: Payer: Self-pay | Admitting: Internal Medicine

## 2013-02-14 DIAGNOSIS — Z1231 Encounter for screening mammogram for malignant neoplasm of breast: Secondary | ICD-10-CM

## 2013-02-28 ENCOUNTER — Ambulatory Visit (HOSPITAL_COMMUNITY)
Admission: RE | Admit: 2013-02-28 | Discharge: 2013-02-28 | Disposition: A | Discharge status: Home / Self Care | Payer: BC Managed Care – PPO | Source: Ambulatory Visit | Attending: Internal Medicine | Admitting: Internal Medicine

## 2013-02-28 DIAGNOSIS — Z1231 Encounter for screening mammogram for malignant neoplasm of breast: Secondary | ICD-10-CM

## 2013-11-12 ENCOUNTER — Ambulatory Visit: Payer: BC Managed Care – PPO | Admitting: Medical

## 2013-11-16 ENCOUNTER — Emergency Department (INDEPENDENT_AMBULATORY_CARE_PROVIDER_SITE_OTHER)
Admission: EM | Admit: 2013-11-16 | Discharge: 2013-11-16 | Disposition: A | Discharge status: Home / Self Care | Payer: Commercial Managed Care - HMO | Source: Home / Self Care

## 2013-11-16 ENCOUNTER — Encounter: Payer: Self-pay | Admitting: Emergency Medicine

## 2013-11-16 DIAGNOSIS — L239 Allergic contact dermatitis, unspecified cause: Secondary | ICD-10-CM

## 2013-11-16 DIAGNOSIS — L259 Unspecified contact dermatitis, unspecified cause: Secondary | ICD-10-CM

## 2013-11-16 MED ORDER — PREDNISONE 10 MG PO TABS: ORAL_TABLET | ORAL | Status: DC

## 2013-11-16 NOTE — Discharge Instructions (Signed)

## 2013-11-16 NOTE — ED Provider Notes (Signed)
CSN: [id number]     Arrival date & time 11/16/13  1213 History   None    Chief Complaint  Patient presents with  . Rash   (Consider location/radiation/quality/duration/timing/severity/associated sxs/prior Treatment) Patient is a 63 y.o. female presenting with rash. The history is provided by the patient. No language interpreter was used.  Rash Pain location:  Generalized Pain radiates to:  Does not radiate Pain severity:  Mild Onset quality:  Gradual Duration:  1 week Timing:  Constant Progression:  Worsening Chronicity:  New Relieved by:  Nothing Worsened by:  Nothing tried Ineffective treatments:  None tried Associated symptoms: no nausea     Past Medical History  Diagnosis Date  . History of lithotripsy     urolthiasis 2004  . Osteoarthritis   . Hypothyroidism     sees endocrinology  . Hyperlipidemia 09/10/2010  . Normal cardiac stress test 6-12    had CP-DOE  . Hypertension     no meds  . Wears glasses   . Wears dentures     top   Past Surgical History  Procedure Laterality Date  . Hysterectomy (unknown)      B oophoreectomy  . Thyroidectomy    . Mass excision  09/12/2011    Procedure: EXCISION MASS;  Surgeon: Cristine Polio, MD;  Location: [place];  Service: Plastics;  Laterality: Left;  left ring finger   Family History  Problem Relation Age of Onset  . Hypertension Mother   . Coronary artery disease Mother     onset?  Marland Kitchen Hypertension Sister   . Diabetes Neg Hx   . Colon cancer Neg Hx   . Breast cancer Neg Hx   . Stroke Neg Hx    History  Substance Use Topics  . Smoking status: Never Smoker   . Smokeless tobacco: Never Used  . Alcohol Use: No   OB History   Grav Para Term Preterm Abortions TAB SAB Ect Mult Living                 Review of Systems  Gastrointestinal: Negative for nausea.  Skin: Positive for rash.  All other systems reviewed and are negative. Pt has rash on arms, thighs and back.   Pt complains of    Allergies  Review of patient's allergies indicates no known allergies.  Home Medications   Prior to Admission medications   Medication Sig Start Date End Date Taking? Authorizing Provider  celecoxib (CELEBREX) 200 MG capsule Take 1 capsule (200 mg total) by mouth daily. Take w/ food 04/21/11   Colon Branch, MD  diclofenac sodium (VOLTAREN) 1 % GEL Apply 1 application topically 4 (four) times daily. 04/21/11   Colon Branch, MD  estradiol (VIVELLE-DOT) 0.0375 MG/24HR Place 1 patch onto the skin 2 (two) times a week.      Historical Provider, MD  levothyroxine (SYNTHROID, LEVOTHROID) 175 MCG tablet TAKE 1 TABLET DAILY 12/10/11   Colon Branch, MD  predniSONE (DELTASONE) 10 MG tablet 6,5,4,3,2,1 taper 11/16/13   Fransico Meadow, PA-C  simvastatin (ZOCOR) 20 MG tablet Take 1 tablet (20 mg total) by mouth at bedtime. 09/12/11   Cristine Polio, MD  zolpidem (AMBIEN) 10 MG tablet Take 1 tablet (10 mg total) by mouth at bedtime as needed. 04/21/11   Colon Branch, MD   BP 128/89  Pulse 76  Temp(Src) 97.8 F (36.6 C) (Oral)  Ht 5\' 3"  (1.6 m)  Wt 237 lb 12 oz (107.843 kg)  BMI 42.13 kg/m2  SpO2 98% Physical Exam  Nursing note and vitals reviewed. Constitutional: She is oriented to person, place, and time. She appears well-developed and well-nourished.  HENT:  Head: Normocephalic.  Eyes: EOM are normal. Pupils are equal, round, and reactive to light.  Neck: Normal range of motion.  Cardiovascular: Normal rate.   Pulmonary/Chest: Effort normal.  Abdominal: She exhibits no distension.  Musculoskeletal: Normal range of motion.  Neurological: She is alert and oriented to person, place, and time.  Skin: Rash noted.  Palmar aspect of wrist, inner thighs and back,  Raised, dark,   Psychiatric: She has a normal mood and affect.    ED Course  Procedures (including critical care time) Labs Review Labs Reviewed - No data to display  Imaging Review No results found.   MDM   1. Allergic dermatitis         Fransico Meadow, PA-C 11/16/13 1320

## 2013-11-16 NOTE — ED Notes (Signed)
Rash noted on forearms, thighs, and back.  Itching. Started about l week ago but worsening.

## 2013-11-21 NOTE — ED Provider Notes (Signed)
Medical history/examination/treatment/procedure(s) were performed by non-physician provider and as supervising physician I was immediately available for consultation/collaboration.   Jacqulyn Cane, MD 11/21/13 [phone]

## 2013-11-22 ENCOUNTER — Ambulatory Visit (INDEPENDENT_AMBULATORY_CARE_PROVIDER_SITE_OTHER): Payer: Commercial Managed Care - HMO | Admitting: Internal Medicine

## 2013-11-22 ENCOUNTER — Encounter: Payer: Self-pay | Admitting: Internal Medicine

## 2013-11-22 VITALS — BP 112/68 | HR 57 | Temp 97.6°F | Wt 241.0 lb

## 2013-11-22 DIAGNOSIS — E785 Hyperlipidemia, unspecified: Secondary | ICD-10-CM

## 2013-11-22 DIAGNOSIS — R21 Rash and other nonspecific skin eruption: Secondary | ICD-10-CM

## 2013-11-22 DIAGNOSIS — I10 Essential (primary) hypertension: Secondary | ICD-10-CM

## 2013-11-22 DIAGNOSIS — E039 Hypothyroidism, unspecified: Secondary | ICD-10-CM

## 2013-11-22 MED ORDER — ZOLPIDEM TARTRATE 10 MG PO TABS: 10.0000 mg | ORAL_TABLET | Freq: Every evening | ORAL | Status: DC | PRN

## 2013-11-22 MED ORDER — LEVOTHYROXINE SODIUM 150 MCG PO TABS: 150.0000 ug | ORAL_TABLET | Freq: Every day | ORAL | Status: DC

## 2013-11-22 MED ORDER — SIMVASTATIN 20 MG PO TABS: 20.0000 mg | ORAL_TABLET | Freq: Every day | ORAL | Status: DC

## 2013-11-22 MED ORDER — LISINOPRIL-HYDROCHLOROTHIAZIDE 10-12.5 MG PO TABS: 1.0000 | ORAL_TABLET | Freq: Every day | ORAL | Status: DC

## 2013-11-22 NOTE — Patient Instructions (Signed)
Please come back to the office within 6 weeks  for a routine office visit  Come back fasting   Stop by the front desk and schedule the visit

## 2013-11-22 NOTE — Progress Notes (Signed)
Pre visit review using our clinic review tool, if applicable. No additional management support is needed unless otherwise documented below in the visit note. 

## 2013-11-22 NOTE — Progress Notes (Signed)
Subjective:    Patient ID: Kelly Perkins, female    DOB: 1950/08/04, 63 y.o.   MRN: [id number]  DOS:  11/22/2013 Type of visit - description : acute, last OV 2013, was seen at the [facility] d/t insurance issues  Interval history: Recently developed a rash on the wrists, was seen elsewhere, prescribe prednisone, much improved. As far as her chronic medical issues, needs medications refills. Reports that the last time she had labs was 3 months ago on she was told it was okay. Normal amb BPs per patient   ROS Denies any fever or chills. No lips or tongue swelling. No difficulty breathing  Past Medical History  Diagnosis Date  . History of lithotripsy     urolthiasis 2004  . Osteoarthritis   . Hypothyroidism     sees endocrinology  . Hyperlipidemia 09/10/2010  . Normal cardiac stress test 6-12    had CP-DOE  . Hypertension     no meds  . Wears glasses   . Wears dentures     top    Past Surgical History  Procedure Laterality Date  . Hysterectomy (unknown)      B oophoreectomy  . Thyroidectomy    . Mass excision  09/12/2011    Procedure: EXCISION MASS;  Surgeon: Cristine Polio, MD;  Location: [place];  Service: Plastics;  Laterality: Left;  left ring finger    History   Social History  . Marital Status: Single    Spouse Name: N/A    Number of Children: N/A  . Years of Education: N/A   Occupational History  . Not on file.   Social History Main Topics  . Smoking status: Never Smoker   . Smokeless tobacco: Never Used  . Alcohol Use: No  . Drug Use: No  . Sexual Activity: Not on file   Other Topics Concern  . Not on file   Social History Narrative   Single, mom is one of my patients [name]. ETOH-socially. Diet is not good. Walks 2 miles daily         Medication List       This list is accurate as of: 11/22/13 11:10 AM.  Always use your most recent med list.               diclofenac 75 MG EC tablet  Commonly known  as:  VOLTAREN  Take 75 mg by mouth 2 (two) times daily as needed.     levothyroxine 150 MCG tablet  Commonly known as:  SYNTHROID, LEVOTHROID  Take 150 mcg by mouth daily before breakfast.     lisinopril-hydrochlorothiazide 10-12.5 MG per tablet  Commonly known as:  PRINZIDE,ZESTORETIC  Take 1 tablet by mouth daily.     predniSONE 10 MG tablet  Commonly known as:  DELTASONE  6,5,4,3,2,1 taper     simvastatin 20 MG tablet  Commonly known as:  ZOCOR  Take 1 tablet (20 mg total) by mouth at bedtime.     zolpidem 10 MG tablet  Commonly known as:  AMBIEN  Take 1 tablet (10 mg total) by mouth at bedtime as needed.           Objective:   Physical Exam BP 112/68  Pulse 57  Temp(Src) 97.6 F (36.4 C) (Oral)  Wt 241 lb (109.317 kg)  SpO2 97%  General -- alert, well-developed, NAD.  Lungs -- normal respiratory effort, no intercostal retractions, no accessory muscle use, and normal breath sounds.  Heart-- normal rate, regular rhythm, no murmur.   Extremities-- no pretibial edema bilaterally  Skin-- at wrists, she has a dried up rash. No redness.Interdigital spaces normal Neurologic--  alert & oriented X3. Speech normal, gait appropriate for age, strength symmetric and appropriate for age.  Psych-- Cognition and judgment appear intact. Cooperative with normal attention span and concentration. No anxious or depressed appearing.       Assessment & Plan:   Rash, resolved  Hypertension, high cholesterol hypothyroidism: Refill medications # 30 days  and one refill.  Needs office visit to get reestablished with in 6 weeks

## 2013-12-09 ENCOUNTER — Emergency Department (INDEPENDENT_AMBULATORY_CARE_PROVIDER_SITE_OTHER)
Admission: EM | Admit: 2013-12-09 | Discharge: 2013-12-09 | Disposition: A | Discharge status: Home / Self Care | Payer: Commercial Managed Care - HMO | Source: Home / Self Care | Attending: Emergency Medicine | Admitting: Emergency Medicine

## 2013-12-09 ENCOUNTER — Encounter: Payer: Self-pay | Admitting: Emergency Medicine

## 2013-12-09 DIAGNOSIS — L259 Unspecified contact dermatitis, unspecified cause: Secondary | ICD-10-CM

## 2013-12-09 MED ORDER — TRIAMCINOLONE ACETONIDE 0.025 % EX OINT: 1.0000 "application " | TOPICAL_OINTMENT | Freq: Two times a day (BID) | CUTANEOUS | Status: DC

## 2013-12-09 MED ORDER — METHYLPREDNISOLONE SODIUM SUCC 40 MG IJ SOLR
125.0000 mg | Freq: Once | INTRAMUSCULAR | Status: AC
  Administered 2013-12-09: 125 mg via INTRAMUSCULAR

## 2013-12-09 NOTE — ED Provider Notes (Signed)
CSN: [id number]     Arrival date & time 12/09/13  1848 History   First MD Initiated Contact with Patient 12/09/13 1905     Chief Complaint  Patient presents with  . Rash   (Consider location/radiation/quality/duration/timing/severity/associated sxs/prior Treatment) Patient is a 63 y.o. female presenting with rash. The history is provided by the patient. No language interpreter was used.  Rash Pain location: arms and legs. Pain radiates to:  Does not radiate Pain severity:  Moderate Onset quality:  Gradual Timing:  Constant Progression:  Worsening Chronicity:  Recurrent Relieved by:  Nothing Worsened by:  Nothing tried Ineffective treatments:  None tried Pt seen here by me 2 weeks ago.  Pt reports she got better but now rash has returned  Past Medical History  Diagnosis Date  . History of lithotripsy     urolthiasis 2004  . Osteoarthritis   . Hypothyroidism     sees endocrinology  . Hyperlipidemia 09/10/2010  . Normal cardiac stress test 6-12    had CP-DOE  . Hypertension     no meds  . Wears glasses   . Wears dentures     top   Past Surgical History  Procedure Laterality Date  . Hysterectomy (unknown)      B oophoreectomy  . Thyroidectomy    . Mass excision  09/12/2011    Procedure: EXCISION MASS;  Surgeon: Cristine Polio, MD;  Location: [place];  Service: Plastics;  Laterality: Left;  left ring finger   Family History  Problem Relation Age of Onset  . Hypertension Mother   . Coronary artery disease Mother     onset?  Marland Kitchen Hypertension Sister   . Diabetes Neg Hx   . Colon cancer Neg Hx   . Breast cancer Neg Hx   . Stroke Neg Hx    History  Substance Use Topics  . Smoking status: Never Smoker   . Smokeless tobacco: Never Used  . Alcohol Use: No   OB History   Grav Para Term Preterm Abortions TAB SAB Ect Mult Living                 Review of Systems  Skin: Positive for rash.  All other systems reviewed and are negative.   Allergies   Review of patient's allergies indicates no known allergies.  Home Medications   Prior to Admission medications   Medication Sig Start Date End Date Taking? Authorizing Provider  diclofenac (VOLTAREN) 75 MG EC tablet Take 75 mg by mouth 2 (two) times daily as needed.    Historical Provider, MD  levothyroxine (SYNTHROID, LEVOTHROID) 150 MCG tablet Take 1 tablet (150 mcg total) by mouth daily before breakfast. 11/22/13   Colon Branch, MD  lisinopril-hydrochlorothiazide (PRINZIDE,ZESTORETIC) 10-12.5 MG per tablet Take 1 tablet by mouth daily. 11/22/13   Colon Branch, MD  predniSONE (DELTASONE) 10 MG tablet 6,5,4,3,2,1 taper 11/16/13   Fransico Meadow, PA-C  simvastatin (ZOCOR) 20 MG tablet Take 1 tablet (20 mg total) by mouth at bedtime. 11/22/13   Colon Branch, MD  zolpidem (AMBIEN) 10 MG tablet Take 1 tablet (10 mg total) by mouth at bedtime as needed. 11/22/13   Colon Branch, MD   BP 146/86  Pulse 72  Temp(Src) 97.9 F (36.6 C) (Oral)  Resp 16  Ht 5\' 3"  (1.6 m)  Wt 241 lb (109.317 kg)  BMI 42.70 kg/m2  SpO2 98% Physical Exam  Nursing note and vitals reviewed. Constitutional: She is oriented to  person, place, and time. She appears well-developed and well-nourished.  HENT:  Head: Normocephalic.  Eyes: EOM are normal.  Neck: Normal range of motion.  Pulmonary/Chest: Effort normal.  Abdominal: She exhibits no distension.  Musculoskeletal: Normal range of motion.  Neurological: She is alert and oriented to person, place, and time.  Skin: Rash noted.  Fine erythematous rash arms and legs  Psychiatric: She has a normal mood and affect.    ED Course  Procedures (including critical care time) Labs Review Labs Reviewed - No data to display  Imaging Review No results found.   MDM   1. Contact dermatitis    Pt given solumedrol injection Triamcinalone ointment    Fransico Meadow, [place] 12/09/13 1928

## 2013-12-09 NOTE — Discharge Instructions (Signed)

## 2013-12-09 NOTE — ED Notes (Signed)
Pt c/o rash on her legs and arms. She reports that it got better from her visit 2 wks ago, but came back again x 1 wk.

## 2013-12-12 NOTE — ED Provider Notes (Signed)
Medical history/examination/treatment/procedure(s) were performed by non-physician provider and as supervising physician I was immediately available for consultation/collaboration.   Jacqulyn Cane, MD 12/12/13 [phone]

## 2013-12-16 ENCOUNTER — Telehealth: Payer: Self-pay | Admitting: Internal Medicine

## 2013-12-16 DIAGNOSIS — L259 Unspecified contact dermatitis, unspecified cause: Secondary | ICD-10-CM

## 2013-12-16 NOTE — Telephone Encounter (Signed)
Caller name: Jannell  Call back number:[phone] or [phone]   Reason for call:  Pt is wanting a referral to dermatologist for her Rash. Has been seen for this.

## 2013-12-16 NOTE — Telephone Encounter (Signed)
Referral to dermatology ordered

## 2014-01-03 ENCOUNTER — Ambulatory Visit (INDEPENDENT_AMBULATORY_CARE_PROVIDER_SITE_OTHER): Payer: Commercial Managed Care - HMO | Admitting: Internal Medicine

## 2014-01-03 ENCOUNTER — Encounter: Payer: Self-pay | Admitting: Internal Medicine

## 2014-01-03 VITALS — BP 138/68 | HR 68 | Temp 97.7°F | Wt 240.4 lb

## 2014-01-03 DIAGNOSIS — G4700 Insomnia, unspecified: Secondary | ICD-10-CM

## 2014-01-03 DIAGNOSIS — E785 Hyperlipidemia, unspecified: Secondary | ICD-10-CM

## 2014-01-03 DIAGNOSIS — D649 Anemia, unspecified: Secondary | ICD-10-CM

## 2014-01-03 DIAGNOSIS — E033 Postinfectious hypothyroidism: Secondary | ICD-10-CM

## 2014-01-03 DIAGNOSIS — I10 Essential (primary) hypertension: Secondary | ICD-10-CM

## 2014-01-03 DIAGNOSIS — Z23 Encounter for immunization: Secondary | ICD-10-CM

## 2014-01-03 LAB — CBC WITH DIFFERENTIAL/PLATELET
BASOS ABS: 0.1 10*3/uL (ref 0.0–0.1)
BASOS PCT: 1.1 % (ref 0.0–3.0)
Eosinophils Absolute: 0.1 10*3/uL (ref 0.0–0.7)
Eosinophils Relative: 1.3 % (ref 0.0–5.0)
HCT: 34.9 % — ABNORMAL LOW (ref 36.0–46.0)
Hemoglobin: 11.3 g/dL — ABNORMAL LOW (ref 12.0–15.0)
Lymphocytes Relative: 40.9 % (ref 12.0–46.0)
Lymphs Abs: 3 10*3/uL (ref 0.7–4.0)
MCHC: 32.4 g/dL (ref 30.0–36.0)
MCV: 92.5 fl (ref 78.0–100.0)
Monocytes Absolute: 0.7 10*3/uL (ref 0.1–1.0)
Monocytes Relative: 8.9 % (ref 3.0–12.0)
Neutro Abs: 3.5 10*3/uL (ref 1.4–7.7)
Neutrophils Relative %: 47.8 % (ref 43.0–77.0)
Platelets: 205 10*3/uL (ref 150.0–400.0)
RBC: 3.78 Mil/uL — AB (ref 3.87–5.11)
RDW: 13.6 % (ref 11.5–15.5)
WBC: 7.4 10*3/uL (ref 4.0–10.5)

## 2014-01-03 LAB — COMPREHENSIVE METABOLIC PANEL
ALT: 27 U/L (ref 0–35)
AST: 16 U/L (ref 0–37)
Albumin: 3.3 g/dL — ABNORMAL LOW (ref 3.5–5.2)
Alkaline Phosphatase: 71 U/L (ref 39–117)
BUN: 22 mg/dL (ref 6–23)
CALCIUM: 9.1 mg/dL (ref 8.4–10.5)
CHLORIDE: 106 meq/L (ref 96–112)
CO2: 26 mEq/L (ref 19–32)
Creatinine, Ser: 0.9 mg/dL (ref 0.4–1.2)
GFR: 78.76 mL/min (ref >60.00)
GLUCOSE: 82 mg/dL (ref 70–99)
POTASSIUM: 3.7 meq/L (ref 3.5–5.1)
Sodium: 141 mEq/L (ref 135–145)
Total Bilirubin: 0.8 mg/dL (ref 0.2–1.2)
Total Protein: 7.8 g/dL (ref 6.0–8.3)

## 2014-01-03 LAB — LIPID PANEL
CHOLESTEROL: 172 mg/dL (ref 0–200)
HDL: 54.1 mg/dL (ref >39.00)
LDL CALC: 106 mg/dL — AB (ref 0–99)
NonHDL: 117.9
Total CHOL/HDL Ratio: 3
Triglycerides: 62 mg/dL (ref 0.0–149.0)
VLDL: 12.4 mg/dL (ref 0.0–40.0)

## 2014-01-03 LAB — FERRITIN: Ferritin: 61.4 ng/mL (ref 10.0–291.0)

## 2014-01-03 LAB — IRON: IRON: 100 ug/dL (ref 42–145)

## 2014-01-03 LAB — TSH: TSH: 0.63 u[IU]/mL (ref 0.35–4.50)

## 2014-01-03 NOTE — Patient Instructions (Signed)
Get your blood work before you leave   Check the  blood pressure 2 or 3 times a month  Be sure your blood pressure is between 145/85 and 110/65.  if it is consistently higher or lower, let me know      Please come back to the office in 6 months for a physical exam., no  fasting

## 2014-01-03 NOTE — Assessment & Plan Note (Addendum)
HTN well-controlled, recommend to check ambulatory BPs, check labs Continue with lisinopril HCT

## 2014-01-03 NOTE — Assessment & Plan Note (Signed)
Good compliance with simvastatin, labs 

## 2014-01-03 NOTE — Assessment & Plan Note (Signed)
Continue with Synthroid, check a TSH

## 2014-01-03 NOTE — Assessment & Plan Note (Signed)
Well-controlled on Ambien however it is becoming very expensive, the patient to let me know if we need to switch medication

## 2014-01-03 NOTE — Progress Notes (Signed)
Subjective:    Patient ID: Kelly Perkins, female    DOB: 19-Sep-1950, 63 y.o.   MRN: [id number]  DOS:  01/03/2014 Type of visit - description : rov Interval history: Hypertension, good medication compliance, no recent ambulatory BPs High cholesterol, good compliance with statins, patient is fasting, due for labs Hypothyroidism, used to see endocrinology but not recently, taking Synthroid regularly, due for labs insomnia, well-controlled with Ambien, may have problems in the future d/t cost. Would like to update her immunizations   ROS Denies chest pain or difficulty breathing No nausea, vomiting, diarrhea No cough,bronchial congestion.   Past Medical History  Diagnosis Date  . History of lithotripsy     urolthiasis 2004  . Osteoarthritis   . Hypothyroidism   . Hyperlipidemia 09/10/2010  . Normal cardiac stress test 6-12    had CP-DOE  . Hypertension     no meds  . Wears glasses   . Wears dentures     top    Past Surgical History  Procedure Laterality Date  . Hysterectomy (unknown)      B oophoreectomy  . Thyroidectomy    . Mass excision  09/12/2011    Procedure: EXCISION MASS;  Surgeon: Kelly Polio, MD;  Location: [place];  Service: Plastics;  Laterality: Left;  left ring finger    History   Social History  . Marital Status: Single    Spouse Name: N/A    Number of Children: 2  . Years of Education: N/A   Occupational History  . 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 Smoking status: Never Smoker   . Smokeless tobacco: Never Used  . Alcohol Use: Yes  . Drug Use: No  . Sexual Activity: Not on file   Other Topics Concern  . Not on file   Social History Narrative   Single, mom is one of my patients [name].             Medication List       This list is accurate as of: 01/03/14 11:59 PM.  Always use your most recent med list.               levothyroxine 150 MCG tablet  Commonly known  as:  SYNTHROID, LEVOTHROID  Take 1 tablet (150 mcg total) by mouth daily before breakfast.     lisinopril-hydrochlorothiazide 10-12.5 MG per tablet  Commonly known as:  PRINZIDE,ZESTORETIC  Take 1 tablet by mouth daily.     simvastatin 20 MG tablet  Commonly known as:  ZOCOR  Take 1 tablet (20 mg total) by mouth at bedtime.     triamcinolone 0.025 % ointment  Commonly known as:  KENALOG  Apply 1 application topically 2 (two) times daily.     zolpidem 10 MG tablet  Commonly known as:  AMBIEN  Take 1 tablet (10 mg total) by mouth at bedtime as needed.           Objective:   Physical Exam BP 138/68  Pulse 68  Temp(Src) 97.7 F (36.5 C) (Oral)  Wt 240 lb 6 oz (109.033 kg)  SpO2 96%  General -- alert, well-developed, NAD.  Neck --no thyromegaly  HEENT-- slt pale ?  Lungs -- normal respiratory effort, no intercostal retractions, no accessory muscle use, and normal breath sounds.  Heart-- normal rate, regular rhythm, no murmur.   Extremities-- no pretibial edema bilaterally  Neurologic--  alert & oriented X3. Speech  normal, gait appropriate for age, strength symmetric and appropriate for age.   Psych-- Cognition and judgment appear intact. Cooperative with normal attention span and concentration. No anxious or depressed appearing.         Assessment & Plan:    Had a flu shot Pneumonia shot today rec a  physical exam in 6 months

## 2014-01-03 NOTE — Progress Notes (Signed)
Pre visit review using our clinic review tool, if applicable. No additional management support is needed unless otherwise documented below in the visit note. 

## 2014-01-03 NOTE — Assessment & Plan Note (Signed)
History of anemia, see previous entry, check CBC ferritin and iron

## 2014-01-21 ENCOUNTER — Telehealth: Payer: Self-pay

## 2014-01-21 NOTE — Telephone Encounter (Signed)
UDS: 01/03/2014  Positive for Ambien   Low risk per Dr. Larose Kells 01/21/2014  Per Dr. Larose Kells no future UDS needed if she is only on Ambien.

## 2014-01-23 ENCOUNTER — Other Ambulatory Visit: Payer: Self-pay | Admitting: Internal Medicine

## 2014-01-24 ENCOUNTER — Telehealth: Payer: Self-pay

## 2014-01-24 MED ORDER — ZOLPIDEM TARTRATE 10 MG PO TABS: 10.0000 mg | ORAL_TABLET | Freq: Every evening | ORAL | Status: DC | PRN

## 2014-01-24 NOTE — Telephone Encounter (Signed)
done

## 2014-01-24 NOTE — Telephone Encounter (Signed)
Faxed to [facility] pharmacy.  

## 2014-01-24 NOTE — Telephone Encounter (Signed)
Pt is requesting refill on Ambien.  Last OV: 01/03/2014 Last Fill: 11/22/2013 # 30 1 RF UDS: 01/03/2014 Low risk  Please advise.

## 2014-01-27 ENCOUNTER — Other Ambulatory Visit: Payer: Self-pay | Admitting: Internal Medicine

## 2014-01-27 ENCOUNTER — Encounter: Payer: Self-pay | Admitting: Internal Medicine

## 2014-01-27 DIAGNOSIS — Z1231 Encounter for screening mammogram for malignant neoplasm of breast: Secondary | ICD-10-CM

## 2014-01-28 ENCOUNTER — Other Ambulatory Visit: Payer: Self-pay | Admitting: Internal Medicine

## 2014-03-03 ENCOUNTER — Telehealth: Payer: Self-pay | Admitting: Internal Medicine

## 2014-03-03 ENCOUNTER — Ambulatory Visit (HOSPITAL_COMMUNITY): Payer: Medicare HMO

## 2014-03-03 NOTE — Telephone Encounter (Signed)
Pt was given # 30 tablets with 5 refills of Ambien on 01/24/2014, Pt should have enough until March, she needs to call her pharmacy to get refills.

## 2014-03-03 NOTE — Telephone Encounter (Signed)
Caller name: Yisel, Megill Relation to pt: self  Call back number: [phone] Pharmacy: [facility] [id number]  Reason for call:  Pt requesting a refill of zolpidem (AMBIEN) 10 MG tablet

## 2014-03-05 NOTE — Telephone Encounter (Signed)
Prior authorization initiated for Ambien. Awaiting determination. JG//CMA

## 2014-03-06 NOTE — Telephone Encounter (Signed)
PA approved effective through 03/06/2015. JG//CMA

## 2014-03-10 ENCOUNTER — Telehealth: Payer: Self-pay | Admitting: Internal Medicine

## 2014-03-10 DIAGNOSIS — L259 Unspecified contact dermatitis, unspecified cause: Secondary | ICD-10-CM

## 2014-03-10 NOTE — Telephone Encounter (Signed)
Ok , please enter another referral

## 2014-03-10 NOTE — Telephone Encounter (Signed)
Another referral ordered to dermatology.

## 2014-03-10 NOTE — Telephone Encounter (Signed)
Pt was given Ambien on 01/24/2014, # 30 and 5 refills. Her pharmacy should still have the prescription.

## 2014-03-10 NOTE — Telephone Encounter (Signed)
Caller name: [place] Relation to pt: self Call back number: [phone] Pharmacy:  Reason for call:   Patient would like to be referred to another dermatologist. She states that she does not like the dermatologist that we had originally referred her to.

## 2014-03-10 NOTE — Telephone Encounter (Signed)
Please send rx in

## 2014-03-10 NOTE — Telephone Encounter (Signed)
Informed patient of this.  °

## 2014-03-10 NOTE — Telephone Encounter (Signed)
Please advise 

## 2014-03-11 ENCOUNTER — Ambulatory Visit (HOSPITAL_COMMUNITY)
Admission: RE | Admit: 2014-03-11 | Discharge: 2014-03-11 | Disposition: A | Discharge status: Home / Self Care | Payer: Commercial Managed Care - HMO | Source: Ambulatory Visit | Attending: Internal Medicine | Admitting: Internal Medicine

## 2014-03-11 DIAGNOSIS — Z1231 Encounter for screening mammogram for malignant neoplasm of breast: Secondary | ICD-10-CM | POA: Insufficient documentation

## 2014-03-12 ENCOUNTER — Telehealth: Payer: Self-pay | Admitting: *Deleted

## 2014-03-12 NOTE — Telephone Encounter (Signed)
Prior authorization for zolpidem tartrate 10 mg tab approved effective through 03/06/2015. JG//CMA

## 2014-03-25 DIAGNOSIS — L309 Dermatitis, unspecified: Secondary | ICD-10-CM | POA: Diagnosis not present

## 2014-06-09 ENCOUNTER — Encounter: Payer: Self-pay | Admitting: Internal Medicine

## 2014-06-09 ENCOUNTER — Ambulatory Visit (INDEPENDENT_AMBULATORY_CARE_PROVIDER_SITE_OTHER): Payer: Commercial Managed Care - HMO | Admitting: Internal Medicine

## 2014-06-09 VITALS — BP 124/68 | HR 71 | Temp 98.1°F | Ht 63.0 in | Wt 232.2 lb

## 2014-06-09 DIAGNOSIS — G4700 Insomnia, unspecified: Secondary | ICD-10-CM | POA: Diagnosis not present

## 2014-06-09 DIAGNOSIS — E033 Postinfectious hypothyroidism: Secondary | ICD-10-CM | POA: Diagnosis not present

## 2014-06-09 DIAGNOSIS — Z0000 Encounter for general adult medical examination without abnormal findings: Secondary | ICD-10-CM | POA: Diagnosis not present

## 2014-06-09 DIAGNOSIS — K219 Gastro-esophageal reflux disease without esophagitis: Secondary | ICD-10-CM | POA: Diagnosis not present

## 2014-06-09 DIAGNOSIS — I10 Essential (primary) hypertension: Secondary | ICD-10-CM | POA: Diagnosis not present

## 2014-06-09 DIAGNOSIS — D649 Anemia, unspecified: Secondary | ICD-10-CM

## 2014-06-09 LAB — CBC WITH DIFFERENTIAL/PLATELET
BASOS ABS: 0.1 10*3/uL (ref 0.0–0.1)
Basophils Relative: 2.4 % (ref 0.0–3.0)
Eosinophils Absolute: 0.1 10*3/uL (ref 0.0–0.7)
Eosinophils Relative: 2.6 % (ref 0.0–5.0)
HEMATOCRIT: 34.4 % — AB (ref 36.0–46.0)
Hemoglobin: 11.7 g/dL — ABNORMAL LOW (ref 12.0–15.0)
Lymphocytes Relative: 34.6 % (ref 12.0–46.0)
Lymphs Abs: 1.6 10*3/uL (ref 0.7–4.0)
MCHC: 34 g/dL (ref 30.0–36.0)
MCV: 88.6 fl (ref 78.0–100.0)
Monocytes Absolute: 0.4 10*3/uL (ref 0.1–1.0)
Monocytes Relative: 9.4 % (ref 3.0–12.0)
NEUTROS ABS: 2.4 10*3/uL (ref 1.4–7.7)
Neutrophils Relative %: 51 % (ref 43.0–77.0)
Platelets: 234 10*3/uL (ref 150.0–400.0)
RBC: 3.88 Mil/uL (ref 3.87–5.11)
RDW: 13.2 % (ref 11.5–15.5)
WBC: 4.7 10*3/uL (ref 4.0–10.5)

## 2014-06-09 LAB — TSH: TSH: 1.87 u[IU]/mL (ref 0.35–4.50)

## 2014-06-09 MED ORDER — LEVOTHYROXINE SODIUM 150 MCG PO TABS: 150.0000 ug | ORAL_TABLET | Freq: Every day | ORAL | Status: DC

## 2014-06-09 MED ORDER — PANTOPRAZOLE SODIUM 40 MG PO TBEC: 40.0000 mg | DELAYED_RELEASE_TABLET | Freq: Every day | ORAL | Status: DC

## 2014-06-09 MED ORDER — LISINOPRIL-HYDROCHLOROTHIAZIDE 10-12.5 MG PO TABS: 1.0000 | ORAL_TABLET | Freq: Every day | ORAL | Status: DC

## 2014-06-09 MED ORDER — ZOSTER VACCINE LIVE 19400 UNT/0.65ML ~~LOC~~ SOLR: 0.6500 mL | Freq: Once | SUBCUTANEOUS | Status: DC

## 2014-06-09 MED ORDER — CYCLOBENZAPRINE HCL 10 MG PO TABS
10.0000 mg | ORAL_TABLET | Freq: Every evening | ORAL | Status: DC | PRN
Start: 2014-06-09 — End: 2014-12-13

## 2014-06-09 MED ORDER — SIMVASTATIN 20 MG PO TABS: 20.0000 mg | ORAL_TABLET | Freq: Every day | ORAL | Status: DC

## 2014-06-09 NOTE — Assessment & Plan Note (Signed)
Today, reports that Ambien is not helping as well as before. She also have nocturnal leg cramps. Plan: Continue with Ambien from now, add Flexeril at bedtime, good sleep habits discussed. Reassess in 4 months

## 2014-06-09 NOTE — Progress Notes (Signed)
Pre visit review using our clinic review tool, if applicable. No additional management support is needed unless otherwise documented below in the visit note. 

## 2014-06-09 NOTE — Assessment & Plan Note (Signed)
Good med compliance, check a TSH

## 2014-06-09 NOTE — Assessment & Plan Note (Addendum)
Good med compliance, BP okay today.

## 2014-06-09 NOTE — Progress Notes (Signed)
Subjective:    Patient ID: Kelly Perkins, female    DOB: 1950/06/12, 64 y.o.   MRN: [id number]  DOS:  06/09/2014 Type of visit - description :  Here for Medicare AWV:  1. Risk factors based on Past M, S, F history: reviewed 2. Physical Activities:  Starting water aerobics tomorrow 3. Depression/mood: neg screening  4. Hearing:  No problems noted or reported  5. ADL's: independent, drives  6. Fall Risk: no recent falls, prevention discussed  7. home Safety: does feel safe at home  8. Height, weight, & visual acuity: see VS, sees eye doctor regulalrly 9. Counseling: provided 10. Labs ordered based on risk factors: if needed  11. Referral Coordination: if needed 12. Care Plan, see assessment and plan , written personalized plan provided  13. Cognitive Assessment: motor skills and cognition appropriate for age 22. Care team updated, does not see especialists 15. End-of-life care discussed   In addition, today we discussed the following: High cholesterol, good medication compliance, recent FLP satisfactory Hypertension, good medication compliance, recent BMP okay, not ambulatory BPs but BP today very good Hypothyroidism, good compliance with medications Insomnia, Ambien not working lately. Complain of leg cramps, mostly at night, decrease with walking?.    Review of Systems  Constitutional: No fever, chills. No unexplained wt changes. No unusual sweats HEENT: No dental problems, ear discharge, facial swelling, voice changes. No eye discharge, redness or intolerance to light Respiratory: No wheezing or difficulty breathing. No cough , mucus production Cardiovascular: No CP, leg swelling or palpitation GI: C/o  pain at the upper chest associated with a "excessive eating". No exertional symptoms. Symptoms decrease with PPIs. no nausea, vomiting, diarrhea or abdominal pain.  No blood in the stools.    Endocrine: No polyphagia, polyuria or polydipsia GU: No dysuria, gross  hematuria, difficulty urinating. No urinary urgency or frequency. Musculoskeletal: No joint swellings or unusual aches or pains (other than cramps) Skin: No change in the color of the skin, palor or rash Allergic, immunologic: No environmental allergies or food allergies Neurological: No dizziness or syncope. No headaches. No diplopia, slurred speech, motor deficits, facial numbness Hematological: No enlarged lymph nodes, easy bruising or bleeding Psychiatry: No suicidal ideas, hallucinations, behavior problems or confusion. No unusual/severe anxiety or depression.    Past Medical History  Diagnosis Date  . History of lithotripsy     urolthiasis 2004  . Osteoarthritis   . Hypothyroidism   . Hyperlipidemia 09/10/2010  . Normal cardiac stress test 6-12    had CP-DOE  . Hypertension     no meds  . Wears glasses   . Wears dentures     top    Past Surgical History  Procedure Laterality Date  . Hysterectomy (unknown)  2000    B oophoreectomy  . Thyroidectomy    . Mass excision  09/12/2011    Procedure: EXCISION MASS;  Surgeon: Cristine Polio, MD;  Location: [place];  Service: Plastics;  Laterality: Left;  left ring finger    History   Social History  . Marital Status: Single    Spouse Name: N/A  . Number of Children: 2  . Years of Education: N/A   Occupational History  . 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 Smoking status: Never Smoker   . Smokeless tobacco: Never Used  . Alcohol Use: Yes  . Drug Use: No  . Sexual Activity: Not on file   Other Topics Concern  .  Not on file   Social History Narrative   Single, mom is one of my patients [name].          Family History  Problem Relation Age of Onset  . Hypertension Mother   . Coronary artery disease Mother     onset?  Marland Kitchen Hypertension Sister   . Diabetes Neg Hx   . Colon cancer Neg Hx   . Breast cancer Neg Hx   . Stroke Neg Hx        Medication List        This list is accurate as of: 06/09/14  7:33 PM.  Always use your most recent med list.               cyclobenzaprine 10 MG tablet  Commonly known as:  FLEXERIL  Take 1 tablet (10 mg total) by mouth at bedtime as needed for muscle spasms.     levothyroxine 150 MCG tablet  Commonly known as:  SYNTHROID, LEVOTHROID  Take 1 tablet (150 mcg total) by mouth daily.     lisinopril-hydrochlorothiazide 10-12.5 MG per tablet  Commonly known as:  PRINZIDE,ZESTORETIC  Take 1 tablet by mouth daily.     pantoprazole 40 MG tablet  Commonly known as:  PROTONIX  Take 1 tablet (40 mg total) by mouth daily.     simvastatin 20 MG tablet  Commonly known as:  ZOCOR  Take 1 tablet (20 mg total) by mouth at bedtime.     triamcinolone 0.025 % ointment  Commonly known as:  KENALOG  Apply 1 application topically 2 (two) times daily.     zolpidem 10 MG tablet  Commonly known as:  AMBIEN  Take 1 tablet (10 mg total) by mouth at bedtime as needed.     zoster vaccine live (PF) 19400 UNT/0.65ML injection  Commonly known as:  ZOSTAVAX  Inject 19,400 Units into the skin once.           Objective:   Physical Exam BP 124/68 mmHg  Pulse 71  Temp(Src) 98.1 F (36.7 C) (Oral)  Ht 5\' 3"  (1.6 m)  Wt 232 lb 4 oz (105.348 kg)  BMI 41.15 kg/m2  SpO2 98%  General:   Well developed, well nourished . NAD.  Neck:  Full range of motion. Supple. No  thyromegaly , normal carotid pulse HEENT:  Normocephalic . Face symmetric, atraumatic Lungs:  CTA B Normal respiratory effort, no intercostal retractions, no accessory muscle use. Heart: RRR,  no murmur.  Abdomen:  Not distended, soft, non-tender. No rebound or rigidity. No mass,organomegaly Muscle skeletal: no pretibial edema bilaterally  Skin: Exposed areas without rash. Not pale. Not jaundice Neurologic:  alert & oriented X3.  Speech normal, gait appropriate for age and unassisted Strength symmetric and appropriate for age.  Psych: Cognition  and judgment appear intact.  Cooperative with normal attention span and concentration.  Behavior appropriate. No anxious or depressed appearing.       Assessment & Plan:

## 2014-06-09 NOTE — Patient Instructions (Addendum)
Get your blood work before you leave    Protonix 1 tablet before breakfast every morning  Continue with Ambien Flexeril as needed for leg cramps at bedtime  HEALTHY SLEEP Sleep hygiene: Basic rules for a good night's sleep  Sleep only as much as you need to feel rested and then get out of bed  Keep a regular sleep schedule  Avoid forcing sleep  Exercise regularly for at least 20 minutes, preferably 4 to 5 hours before bedtime  Avoid caffeinated beverages after lunch  Avoid alcohol near bedtime: no "night cap"  Avoid smoking, especially in the evening  Do not go to bed hungry  Adjust bedroom environment  Avoid prolonged use of light-emitting screens before bedtime   Deal with your worries before bedtime    Come back to the office in 4 months  for a routine check up      Fall Prevention and [place] cause injuries and can affect all age groups. It is possible to use preventive measures to significantly decrease the likelihood of falls. There are many simple measures which can make your home safer and prevent falls. OUTDOORS  Repair cracks and edges of walkways and driveways.  Remove high doorway thresholds.  Trim shrubbery on the main path into your home.  Have good outside lighting.  Clear walkways of tools, rocks, debris, and clutter.  Check that handrails are not broken and are securely fastened. Both sides of steps should have handrails.  Have leaves, snow, and ice cleared regularly.  Use sand or salt on walkways during winter months.  In the garage, clean up grease or oil spills. BATHROOM  Install night lights.  Install grab bars by the toilet and in the tub and shower.  Use non-skid mats or decals in the tub or shower.  Place a plastic non-slip stool in the shower to sit on, if needed.  Keep floors dry and clean up all water on the floor immediately.  Remove soap buildup in the tub or shower on a regular basis.  Secure bath mats with non-slip,  double-sided rug tape.  Remove throw rugs and tripping hazards from the floors. BEDROOMS  Install night lights.  Make sure a bedside light is easy to reach.  Do not use oversized bedding.  Keep a telephone by your bedside.  Have a firm chair with side arms to use for getting dressed.  Remove throw rugs and tripping hazards from the floor. KITCHEN  Keep handles on pots and pans turned toward the center of the stove. Use back burners when possible.  Clean up spills quickly and allow time for drying.  Avoid walking on wet floors.  Avoid hot utensils and knives.  Position shelves so they are not too high or low.  Place commonly used objects within easy reach.  If necessary, use a sturdy step stool with a grab bar when reaching.  Keep electrical cables out of the way.  Do not use floor polish or wax that makes floors slippery. If you must use wax, use non-skid floor wax.  Remove throw rugs and tripping hazards from the floor. STAIRWAYS  Never leave objects on stairs.  Place handrails on both sides of stairways and use them. Fix any loose handrails. Make sure handrails on both sides of the stairways are as long as the stairs.  Check carpeting to make sure it is firmly attached along stairs. Make repairs to worn or loose carpet promptly.  Avoid placing throw rugs at the top  or bottom of stairways, or properly secure the rug with carpet tape to prevent slippage. Get rid of throw rugs, if possible.  Have an electrician put in a light switch at the top and bottom of the stairs. OTHER FALL PREVENTION TIPS  Wear low-heel or rubber-soled shoes that are supportive and fit well. Wear closed toe shoes.  When using a stepladder, make sure it is fully opened and both spreaders are firmly locked. Do not climb a closed stepladder.  Add color or contrast paint or tape to grab bars and handrails in your home. Place contrasting color strips on first and last steps.  Learn and use  mobility aids as needed. Install an electrical emergency response system.  Turn on lights to avoid dark areas. Replace light bulbs that burn out immediately. Get light switches that glow.  Arrange furniture to create clear pathways. Keep furniture in the same place.  Firmly attach carpet with non-skid or double-sided tape.  Eliminate uneven floor surfaces.  Select a carpet pattern that does not visually hide the edge of steps.  Be aware of all pets. OTHER HOME SAFETY TIPS  Set the water temperature for 120 F (48.8 C).  Keep emergency numbers on or near the telephone.  Keep smoke detectors on every level of the home and near sleeping areas. Document Released: 02/18/2002 Document Revised: 08/30/2011 Document Reviewed: 05/20/2011 [facility] Patient Information 2015 [place], [place]. This information is not intended to replace advice given to you by your health care provider. Make sure you discuss any questions you have with your health care provider.   Preventive Care for Adults Ages 63 and over  Blood pressure check.** / Every 1 to 2 years.  Lipid and cholesterol check.**/ Every 5 years beginning at age 51.  Lung cancer screening. / Every year if you are aged 25-80 years and have a 30-pack-year history of smoking and currently smoke or have quit within the past 15 years. Yearly screening is stopped once you have quit smoking for at least 15 years or develop a health problem that would prevent you from having lung cancer treatment.  Fecal occult blood test (FOBT) of stool. / Every year beginning at age 68 and continuing until age 50. You may not have to do this test if you get a colonoscopy every 10 years.  Flexible sigmoidoscopy** or colonoscopy.** / Every 5 years for a flexible sigmoidoscopy or every 10 years for a colonoscopy beginning at age 51 and continuing until age 46.  Hepatitis C blood test.** / For all people born from [birth year] through 1967 and any individual with known risks  for hepatitis C.  Abdominal aortic aneurysm (AAA) screening.** / A one-time screening for ages 69 to 4 years who are current or former smokers.  Skin self-exam. / Monthly.  Influenza vaccine. / Every year.  Tetanus, diphtheria, and acellular pertussis (Tdap/Td) vaccine.** / 1 dose of Td every 10 years.  Varicella vaccine.** / Consult your health care provider.  Zoster vaccine.** / 1 dose for adults aged 50 years or older.  Pneumococcal 13-valent conjugate (PCV13) vaccine.** / Consult your health care provider.  Pneumococcal polysaccharide (PPSV23) vaccine.** / 1 dose for all adults aged 75 years and older.  Meningococcal vaccine.** / Consult your health care provider.  Hepatitis A vaccine.** / Consult your health care provider.  Hepatitis B vaccine.** / Consult your health care provider.  Haemophilus influenzae type b (Hib) vaccine.** / Consult your health care provider. **Family history and personal history of risk and conditions  may change your health care provider's recommendations. Document Released: 04/26/2001 Document Revised: 03/05/2013 Document Reviewed: 07/26/2010 [facility] Patient Information 2015 [place], [place]. This information is not intended to replace advice given to you by your health care provider. Make sure you discuss any questions you have with your health care provider.

## 2014-06-09 NOTE — Assessment & Plan Note (Signed)
Td 2009 Pneumonia shot 2015 Prevnar next year  Zostavax, benefits discussed, prescription provided  Last colonoscopy was in 2011, poor prep, was recommended to repeat a year later. Refer to GI.  History of hysterectomy for benign reasons (DUB) in 2000 according to the patient. No history of of abnormal Pap smears: per guidelines  no further Pap smears Mammogram   2015 negative  Diet and exercise discussed

## 2014-06-09 NOTE — Assessment & Plan Note (Signed)
Anterior chest pain with excessive eating, likely GERD, recommend to avoid large meals, take PPIs and reassess in 4 months. No exertional symptoms at this time.

## 2014-06-09 NOTE — Assessment & Plan Note (Signed)
No iron deficiency, monitor a CBC today

## 2014-07-27 ENCOUNTER — Other Ambulatory Visit: Payer: Self-pay | Admitting: Internal Medicine

## 2014-08-06 ENCOUNTER — Other Ambulatory Visit: Payer: Self-pay | Admitting: Internal Medicine

## 2014-08-06 NOTE — Telephone Encounter (Signed)
Ok 30 and 5 RF 

## 2014-08-06 NOTE — Telephone Encounter (Signed)
Rx printed, awaiting MD signature.  

## 2014-08-06 NOTE — Telephone Encounter (Signed)
Rx faxed to [facility].

## 2014-08-06 NOTE — Telephone Encounter (Signed)
Pt is requesting refill on Ambien.  Last OV: 06/09/2014 Last Fill: 01/24/2014 # 30 5RF  Please advise.

## 2014-09-01 ENCOUNTER — Other Ambulatory Visit: Payer: Self-pay

## 2014-09-04 ENCOUNTER — Encounter: Payer: Self-pay | Admitting: Internal Medicine

## 2014-09-04 ENCOUNTER — Ambulatory Visit (INDEPENDENT_AMBULATORY_CARE_PROVIDER_SITE_OTHER): Payer: Commercial Managed Care - HMO | Admitting: Internal Medicine

## 2014-09-04 VITALS — BP 122/84 | HR 85 | Temp 97.5°F | Ht 63.0 in | Wt 236.2 lb

## 2014-09-04 DIAGNOSIS — I10 Essential (primary) hypertension: Secondary | ICD-10-CM | POA: Diagnosis not present

## 2014-09-04 DIAGNOSIS — M170 Bilateral primary osteoarthritis of knee: Secondary | ICD-10-CM | POA: Diagnosis not present

## 2014-09-04 NOTE — Assessment & Plan Note (Signed)
Well-controlled per our readings, recommend to continue current medications and check her ambulatory BPs from time to time

## 2014-09-04 NOTE — Progress Notes (Signed)
Pre visit review using our clinic review tool, if applicable. No additional management support is needed unless otherwise documented below in the visit note. 

## 2014-09-04 NOTE — Patient Instructions (Signed)
Tylenol  500 mg OTC 2 tabs a day every 8 hours as needed for pain

## 2014-09-04 NOTE — Assessment & Plan Note (Signed)
Long history of DJD, getting worse, muscles pain is at the knees. Plan: Referred to [place], she was seen there before Tylenol as needed

## 2014-09-04 NOTE — Progress Notes (Signed)
Subjective:    Patient ID: Kelly Perkins, female    DOB: Jul 08, 1950, 64 y.o.   MRN: [id number]  DOS:  09/04/2014 Type of visit - description : Acute Interval history: Long history of DJD, mostly in the knees, has seen orthopedic before years ago. Now has left worse than right knee pain, worse with ambulation. Not taking any medication. Med list reviewed, good compliance, no ambulatory BPs   Review of Systems Denies chest pain, difficulty breathing. No lower extremity edema  Past Medical History  Diagnosis Date  . History of lithotripsy     urolthiasis 2004  . Osteoarthritis   . Hypothyroidism   . Hyperlipidemia 09/10/2010  . Normal cardiac stress test 6-12    had CP-DOE  . Hypertension     no meds  . Wears glasses   . Wears dentures     top    Past Surgical History  Procedure Laterality Date  . Hysterectomy (unknown)  2000    B oophoreectomy  . Thyroidectomy    . Mass excision  09/12/2011    Procedure: EXCISION MASS;  Surgeon: Cristine Polio, MD;  Location: [facility];  Service: Plastics;  Laterality: Left;  left ring finger    History   Social History  . Marital Status: Single    Spouse Name: N/A  . Number of Children: 2  . Years of Education: N/A   Occupational History  . 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 Smoking status: Never Smoker   . Smokeless tobacco: Never Used  . Alcohol Use: Yes  . Drug Use: No  . Sexual Activity: Not on file   Other Topics Concern  . Not on file   Social History Narrative   Single, mom is one of my patients [name].             Medication List       This list is accurate as of: 09/04/14  3:14 PM.  Always use your most recent med list.               cyclobenzaprine 10 MG tablet  Commonly known as:  FLEXERIL  Take 1 tablet (10 mg total) by mouth at bedtime as needed for muscle spasms.     levothyroxine 150 MCG tablet  Commonly known as:  SYNTHROID,  LEVOTHROID  Take 1 tablet (150 mcg total) by mouth daily.     lisinopril-hydrochlorothiazide 10-12.5 MG per tablet  Commonly known as:  PRINZIDE,ZESTORETIC  Take 1 tablet by mouth daily.     pantoprazole 40 MG tablet  Commonly known as:  PROTONIX  Take 1 tablet (40 mg total) by mouth daily.     simvastatin 20 MG tablet  Commonly known as:  ZOCOR  Take 1 tablet (20 mg total) by mouth at bedtime.     zolpidem 10 MG tablet  Commonly known as:  AMBIEN  Take 1 tablet (10 mg total) by mouth at bedtime.           Objective:   Physical Exam BP 122/84 mmHg  Pulse 85  Temp(Src) 97.5 F (36.4 C) (Oral)  Ht 5\' 3"  (1.6 m)  Wt 236 lb 4 oz (107.162 kg)  BMI 41.86 kg/m2  SpO2 95%  General:   Well developed, well nourished . NAD.  HEENT:  Normocephalic . Face symmetric, atraumatic MSK: Knee deformities consistent with DJD, no effusion, mild tenderness to palpation at the external aspect of  the left knee. Neurologic:  alert & oriented X3.  Speech normal, gait appropriate for age and unassisted Psych--  Cognition and judgment appear intact.  Cooperative with normal attention span and concentration.  Behavior appropriate. No anxious or depressed appearing.       Assessment & Plan:

## 2014-09-22 DIAGNOSIS — M1712 Unilateral primary osteoarthritis, left knee: Secondary | ICD-10-CM | POA: Diagnosis not present

## 2014-10-09 ENCOUNTER — Ambulatory Visit: Payer: Commercial Managed Care - HMO | Admitting: Internal Medicine

## 2014-10-13 ENCOUNTER — Ambulatory Visit: Payer: Commercial Managed Care - HMO | Admitting: Internal Medicine

## 2014-10-16 ENCOUNTER — Ambulatory Visit (INDEPENDENT_AMBULATORY_CARE_PROVIDER_SITE_OTHER): Payer: Commercial Managed Care - HMO | Admitting: Internal Medicine

## 2014-10-16 ENCOUNTER — Encounter: Payer: Self-pay | Admitting: Internal Medicine

## 2014-10-16 VITALS — BP 122/64 | HR 68 | Temp 97.6°F | Ht 63.0 in | Wt 231.2 lb

## 2014-10-16 DIAGNOSIS — Z1211 Encounter for screening for malignant neoplasm of colon: Secondary | ICD-10-CM

## 2014-10-16 DIAGNOSIS — E039 Hypothyroidism, unspecified: Secondary | ICD-10-CM | POA: Diagnosis not present

## 2014-10-16 DIAGNOSIS — E669 Obesity, unspecified: Secondary | ICD-10-CM

## 2014-10-16 DIAGNOSIS — M170 Bilateral primary osteoarthritis of knee: Secondary | ICD-10-CM

## 2014-10-16 DIAGNOSIS — I10 Essential (primary) hypertension: Secondary | ICD-10-CM | POA: Diagnosis not present

## 2014-10-16 DIAGNOSIS — Z0000 Encounter for general adult medical examination without abnormal findings: Secondary | ICD-10-CM

## 2014-10-16 DIAGNOSIS — E785 Hyperlipidemia, unspecified: Secondary | ICD-10-CM | POA: Diagnosis not present

## 2014-10-16 LAB — BASIC METABOLIC PANEL
BUN: 13 mg/dL (ref 6–23)
CO2: 28 mEq/L (ref 19–32)
Calcium: 9.4 mg/dL (ref 8.4–10.5)
Chloride: 105 mEq/L (ref 96–112)
Creatinine, Ser: 0.86 mg/dL (ref 0.40–1.20)
GFR: 84.93 mL/min (ref >60.00)
Glucose, Bld: 91 mg/dL (ref 70–99)
POTASSIUM: 3.1 meq/L — AB (ref 3.5–5.1)
Sodium: 138 mEq/L (ref 135–145)

## 2014-10-16 LAB — TSH: TSH: 0.38 u[IU]/mL (ref 0.35–4.50)

## 2014-10-16 NOTE — Assessment & Plan Note (Signed)
Continue simvastatin. She is trying to exercise more but she has been unable to lose weight. We had extensive discussion about diet and exercise Offered a referral to a nutritionist and she agrees. Will arrange.

## 2014-10-16 NOTE — Patient Instructions (Signed)
Get your blood work before you leave    

## 2014-10-16 NOTE — Progress Notes (Signed)
Pre visit review using our clinic review tool, if applicable. No additional management support is needed unless otherwise documented below in the visit note. 

## 2014-10-16 NOTE — Assessment & Plan Note (Addendum)
Saw  orthopedic surgery, status post and knee injection, better

## 2014-10-16 NOTE — Assessment & Plan Note (Signed)
Continue lisinopril HCT, check a BMP

## 2014-10-16 NOTE — Assessment & Plan Note (Signed)
Has not been able to see GI, will send another referral

## 2014-10-16 NOTE — Progress Notes (Signed)
Subjective:    Patient ID: Kelly Perkins, female    DOB: 1951-02-17, 64 y.o.   MRN: [id number]  DOS:  10/16/2014 Type of visit - description : Routine office visit Interval history: Knee pain:  saw orthopedic surgery, had a local injection, feeling better. High cholesterol, hypertension and insomnia: Good compliance with medication, feeling well.   Review of Systems Denies chest pain or difficulty breathing No nausea, vomiting, diarrhea or blood in the stools  Past Medical History  Diagnosis Date  . History of lithotripsy     urolthiasis 2004  . Osteoarthritis   . Hypothyroidism   . Hyperlipidemia 09/10/2010  . Normal cardiac stress test 6-12    had CP-DOE  . Hypertension     no meds  . Wears glasses   . Wears dentures     top    Past Surgical History  Procedure Laterality Date  . Hysterectomy (unknown)  2000    B oophoreectomy  . Thyroidectomy    . Mass excision  09/12/2011    Procedure: EXCISION MASS;  Surgeon: Cristine Polio, MD;  Location: [facility];  Service: Plastics;  Laterality: Left;  left ring finger    History   Social History  . Marital Status: Single    Spouse Name: N/A  . Number of Children: 2  . Years of Education: N/A   Occupational History  . 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 Smoking status: Never Smoker   . Smokeless tobacco: Never Used  . Alcohol Use: Yes  . Drug Use: No  . Sexual Activity: Not on file   Other Topics Concern  . Not on file   Social History Narrative   Single, mom is one of my patients [name].             Medication List       This list is accurate as of: 10/16/14 11:59 PM.  Always use your most recent med list.               cyclobenzaprine 10 MG tablet  Commonly known as:  FLEXERIL  Take 1 tablet (10 mg total) by mouth at bedtime as needed for muscle spasms.     levothyroxine 150 MCG tablet  Commonly known as:  SYNTHROID, LEVOTHROID  Take 1  tablet (150 mcg total) by mouth daily.     lisinopril-hydrochlorothiazide 10-12.5 MG per tablet  Commonly known as:  PRINZIDE,ZESTORETIC  Take 1 tablet by mouth daily.     pantoprazole 40 MG tablet  Commonly known as:  PROTONIX  Take 1 tablet (40 mg total) by mouth daily.     simvastatin 20 MG tablet  Commonly known as:  ZOCOR  Take 1 tablet (20 mg total) by mouth at bedtime.     zolpidem 10 MG tablet  Commonly known as:  AMBIEN  Take 1 tablet (10 mg total) by mouth at bedtime.           Objective:   Physical Exam BP 122/64 mmHg  Pulse 68  Temp(Src) 97.6 F (36.4 C) (Oral)  Ht 5\' 3"  (1.6 m)  Wt 231 lb 4 oz (104.894 kg)  BMI 40.97 kg/m2  SpO2 98% General:   Well developed, well nourished . NAD.  HEENT:  Normocephalic . Face symmetric, atraumatic Lungs:  CTA B Normal respiratory effort, no intercostal retractions, no accessory muscle use. Heart: RRR,  no murmur.  No pretibial edema bilaterally  Skin: Not pale. Not jaundice Neurologic:  alert & oriented X3.  Speech normal, gait appropriate for age and unassisted Psych--  Cognition and judgment appear intact.  Cooperative with normal attention span and concentration.  Behavior appropriate. No anxious or depressed appearing.      Assessment & Plan:

## 2014-10-16 NOTE — Assessment & Plan Note (Signed)
Continue Synthroid 150 mcg, check a TSH

## 2014-11-13 ENCOUNTER — Other Ambulatory Visit: Payer: Self-pay

## 2014-11-13 DIAGNOSIS — E876 Hypokalemia: Secondary | ICD-10-CM

## 2014-11-24 ENCOUNTER — Other Ambulatory Visit (INDEPENDENT_AMBULATORY_CARE_PROVIDER_SITE_OTHER): Payer: Commercial Managed Care - HMO

## 2014-11-24 DIAGNOSIS — E876 Hypokalemia: Secondary | ICD-10-CM

## 2014-11-24 LAB — BASIC METABOLIC PANEL
BUN: 15 mg/dL (ref 6–23)
CALCIUM: 9.2 mg/dL (ref 8.4–10.5)
CO2: 27 mEq/L (ref 19–32)
CREATININE: 0.9 mg/dL (ref 0.40–1.20)
Chloride: 104 mEq/L (ref 96–112)
GFR: 80.56 mL/min (ref >60.00)
Glucose, Bld: 99 mg/dL (ref 70–99)
Potassium: 3.4 mEq/L — ABNORMAL LOW (ref 3.5–5.1)
Sodium: 139 mEq/L (ref 135–145)

## 2014-12-13 ENCOUNTER — Other Ambulatory Visit: Payer: Self-pay | Admitting: Internal Medicine

## 2014-12-15 NOTE — Telephone Encounter (Signed)
Pt is requesting refill on Flexeril.  Last OV: 10/16/2014 Last Fill: 06/09/2014 #30 4RF   Okay to refill?

## 2014-12-15 NOTE — Telephone Encounter (Signed)
Rx sent 

## 2014-12-15 NOTE — Telephone Encounter (Signed)
Okay #30, 1 refill 

## 2014-12-17 ENCOUNTER — Encounter (INDEPENDENT_AMBULATORY_CARE_PROVIDER_SITE_OTHER): Payer: Self-pay

## 2014-12-17 ENCOUNTER — Encounter: Payer: Self-pay | Admitting: Physician Assistant

## 2014-12-17 ENCOUNTER — Ambulatory Visit (INDEPENDENT_AMBULATORY_CARE_PROVIDER_SITE_OTHER): Payer: Commercial Managed Care - HMO | Admitting: Physician Assistant

## 2014-12-17 VITALS — BP 147/95 | HR 89 | Temp 98.1°F | Resp 18 | Ht 63.0 in | Wt 232.2 lb

## 2014-12-17 DIAGNOSIS — J069 Acute upper respiratory infection, unspecified: Secondary | ICD-10-CM | POA: Diagnosis not present

## 2014-12-17 DIAGNOSIS — B9789 Other viral agents as the cause of diseases classified elsewhere: Principal | ICD-10-CM

## 2014-12-17 MED ORDER — FLUTICASONE PROPIONATE 50 MCG/ACT NA SUSP: 2.0000 | Freq: Every day | NASAL | Status: DC

## 2014-12-17 MED ORDER — HYDROCOD POLST-CPM POLST ER 10-8 MG/5ML PO SUER: 5.0000 mL | Freq: Two times a day (BID) | ORAL | Status: DC | PRN

## 2014-12-17 NOTE — Progress Notes (Signed)
Patient presents to clinic today c/o 3 days of nasal congestion, dry cough and fatigue. Denies fever, chills, recent travel or sick contact. Denies SOB or chest pain. Has taken OTC medications without relief of symptoms.  Past Medical History  Diagnosis Date  . History of lithotripsy     urolthiasis 2004  . Osteoarthritis   . Hypothyroidism   . Hyperlipidemia 09/10/2010  . Normal cardiac stress test 6-12    had CP-DOE  . Hypertension     no meds  . Wears glasses   . Wears dentures     top    Current Outpatient Prescriptions on File Prior to Visit  Medication Sig Dispense Refill  . cyclobenzaprine (FLEXERIL) 10 MG tablet Take 1 tablet (10 mg total) by mouth at bedtime as needed for muscle spasms. 30 tablet 1  . levothyroxine (SYNTHROID, LEVOTHROID) 150 MCG tablet Take 1 tablet (150 mcg total) by mouth daily. 30 tablet 5  . lisinopril-hydrochlorothiazide (PRINZIDE,ZESTORETIC) 10-12.5 MG per tablet Take 1 tablet by mouth daily. 30 tablet 5  . pantoprazole (PROTONIX) 40 MG tablet Take 1 tablet (40 mg total) by mouth daily. 30 tablet 5  . simvastatin (ZOCOR) 20 MG tablet Take 1 tablet (20 mg total) by mouth at bedtime. 30 tablet 5  . zolpidem (AMBIEN) 10 MG tablet Take 1 tablet (10 mg total) by mouth at bedtime. 30 tablet 5   No current facility-administered medications on file prior to visit.    No Known Allergies  Family History  Problem Relation Age of Onset  . Hypertension Mother   . Coronary artery disease Mother     onset?  [place] Hypertension Sister   . Diabetes Neg Hx   . Colon cancer Neg Hx   . Breast cancer Neg Hx   . Stroke Neg Hx     Social History   Social History  . Marital Status: Single    Spouse Name: N/A  . Number of Children: 2  . Years of Education: N/A   Occupational History  . 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 Smoking status: Never Smoker   . Smokeless tobacco: Never Used  . Alcohol Use: Yes  . Drug Use: No    . Sexual Activity: Not Asked   Other Topics Concern  . None   Social History Narrative   Single, mom is one of my patients [name].         Review of Systems - See HPI.  All other ROS are negative.  BP 147/95 mmHg  Pulse 89  Temp(Src) 98.1 F (36.7 C) (Oral)  Resp 18  Ht 5\' 3"  (1.6 m)  Wt 232 lb 4 oz (105.348 kg)  BMI 41.15 kg/m2  SpO2 99%  Physical Exam  Constitutional: She is oriented to person, place, and time and well-developed, well-nourished, and in no distress.  HENT:  Head: Normocephalic and atraumatic.  Right Ear: External ear normal.  Left Ear: External ear normal.  Nose: Nose normal.  Mouth/Throat: Oropharynx is clear and moist. No oropharyngeal exudate.  Eyes: Conjunctivae are normal.  Neck: Neck supple.  Cardiovascular: Normal rate, regular rhythm, normal heart sounds and intact distal pulses.   Pulmonary/Chest: Effort normal and breath sounds normal. No respiratory distress. She has no wheezes. She has no rales. She exhibits no tenderness.  Neurological: She is alert and oriented to person, place, and time.  Skin: Skin is warm and dry. No rash noted.  Psychiatric:  Affect normal.  Vitals reviewed.   Recent Results (from the past 2160 hour(s))  Basic metabolic panel     Status: Abnormal   Collection Time: 10/16/14 11:50 AM  Result Value Ref Range   Sodium 138 135 - 145 mEq/L   Potassium 3.1 (L) 3.5 - 5.1 mEq/L   Chloride 105 96 - 112 mEq/L   CO2 28 19 - 32 mEq/L   Glucose, Bld 91 70 - 99 mg/dL   BUN 13 6 - 23 mg/dL   Creatinine, Ser 0.86 0.40 - 1.20 mg/dL   Calcium 9.4 8.4 - 10.5 mg/dL   GFR 84.93 >60.00 mL/min  TSH     Status: None   Collection Time: 10/16/14 11:50 AM  Result Value Ref Range   TSH 0.38 0.35 - 4.50 uIU/mL  Basic metabolic panel     Status: Abnormal   Collection Time: 11/24/14 10:52 AM  Result Value Ref Range   Sodium 139 135 - 145 mEq/L   Potassium 3.4 (L) 3.5 - 5.1 mEq/L   Chloride 104 96 - 112 mEq/L   CO2 27 19 - 32  mEq/L   Glucose, Bld 99 70 - 99 mg/dL   BUN 15 6 - 23 mg/dL   Creatinine, Ser 0.90 0.40 - 1.20 mg/dL   Calcium 9.2 8.4 - 10.5 mg/dL   GFR 80.56 >60.00 mL/min    Assessment/Plan: Viral URI with cough Rx Flonase. Rx Tussionex. Supportive measures reviewed. Follow-up PRN if symptoms not resolving.

## 2014-12-17 NOTE — Patient Instructions (Signed)
Please stay well hydrated and get plenty of rest. Place a humidifier in the bedroom. Start a multivitamin daily. Please use cough syrup as directed. The Flonase and saline nasal spray will help with nasal congestion and fluid behind ears.  Call or return to clinic if symptoms are not resolving.

## 2014-12-17 NOTE — Assessment & Plan Note (Signed)
Rx Flonase. Rx Tussionex. Supportive measures reviewed. Follow-up PRN if symptoms not resolving.

## 2014-12-17 NOTE — Progress Notes (Signed)
Pre visit review using our clinic review tool, if applicable. No additional management support is needed unless otherwise documented below in the visit note/SLS  

## 2014-12-27 ENCOUNTER — Other Ambulatory Visit: Payer: Self-pay | Admitting: Internal Medicine

## 2015-01-19 ENCOUNTER — Other Ambulatory Visit: Payer: Self-pay | Admitting: Internal Medicine

## 2015-01-28 ENCOUNTER — Other Ambulatory Visit: Payer: Self-pay | Admitting: Internal Medicine

## 2015-01-29 NOTE — Telephone Encounter (Signed)
Pt is requesting refill on Ambien.  Last OV: 10/16/2014, CPE on 06/12/2015 at 1:30 Last Fill: 08/06/2014 #30 and 5RF UDS: Not needed   Please advise.

## 2015-01-29 NOTE — Telephone Encounter (Signed)
Rx faxed to [facility] pharmacy.  

## 2015-01-29 NOTE — Telephone Encounter (Signed)
Okay #30 and 5 refills 

## 2015-01-29 NOTE — Telephone Encounter (Signed)
Rx printed, awaiting MD signature.  

## 2015-02-21 ENCOUNTER — Other Ambulatory Visit: Payer: Self-pay | Admitting: Internal Medicine

## 2015-02-23 NOTE — Telephone Encounter (Signed)
Okay #30 and 3 RF

## 2015-02-23 NOTE — Telephone Encounter (Signed)
Rx sent 

## 2015-02-23 NOTE — Telephone Encounter (Signed)
Pt is requesting refill on Flexeril.  Last OV: 10/16/2014 Last Fill: 12/15/2014 #30 and 1 RF   Please advise.

## 2015-03-15 HISTORY — PX: COLONOSCOPY: SHX174

## 2015-03-17 ENCOUNTER — Telehealth: Payer: Self-pay | Admitting: Internal Medicine

## 2015-03-17 NOTE — Telephone Encounter (Signed)
error:[id number] ° °

## 2015-03-18 DIAGNOSIS — M25561 Pain in right knee: Secondary | ICD-10-CM | POA: Diagnosis not present

## 2015-03-18 DIAGNOSIS — M7701 Medial epicondylitis, right elbow: Secondary | ICD-10-CM | POA: Diagnosis not present

## 2015-04-30 ENCOUNTER — Other Ambulatory Visit: Payer: Self-pay

## 2015-04-30 DIAGNOSIS — Z1231 Encounter for screening mammogram for malignant neoplasm of breast: Secondary | ICD-10-CM

## 2015-05-14 ENCOUNTER — Ambulatory Visit
Admission: RE | Admit: 2015-05-14 | Discharge: 2015-05-14 | Disposition: A | Discharge status: Home / Self Care | Payer: Commercial Managed Care - HMO | Source: Ambulatory Visit

## 2015-05-14 DIAGNOSIS — Z1231 Encounter for screening mammogram for malignant neoplasm of breast: Secondary | ICD-10-CM | POA: Diagnosis not present

## 2015-06-12 ENCOUNTER — Encounter: Payer: Commercial Managed Care - HMO | Admitting: Internal Medicine

## 2015-06-18 ENCOUNTER — Other Ambulatory Visit: Payer: Self-pay | Admitting: Internal Medicine

## 2015-06-18 NOTE — Telephone Encounter (Signed)
Pt is requesting refill on Flexeril.  Last OV: 10/16/2014, CPE on 06/22/2015 at 1100 Last Fill: 02/23/2015 #30 and 3RF  Okay to refill?

## 2015-06-18 NOTE — Telephone Encounter (Signed)
Okay #30 and 3 rf

## 2015-06-18 NOTE — Telephone Encounter (Signed)
Rx sent 

## 2015-06-22 ENCOUNTER — Ambulatory Visit (INDEPENDENT_AMBULATORY_CARE_PROVIDER_SITE_OTHER): Payer: Commercial Managed Care - HMO | Admitting: Internal Medicine

## 2015-06-22 ENCOUNTER — Encounter: Payer: Self-pay | Admitting: Internal Medicine

## 2015-06-22 VITALS — BP 128/74 | HR 72 | Temp 98.2°F | Ht 63.0 in | Wt 229.5 lb

## 2015-06-22 DIAGNOSIS — E785 Hyperlipidemia, unspecified: Secondary | ICD-10-CM | POA: Diagnosis not present

## 2015-06-22 DIAGNOSIS — Z09 Encounter for follow-up examination after completed treatment for conditions other than malignant neoplasm: Secondary | ICD-10-CM | POA: Insufficient documentation

## 2015-06-22 DIAGNOSIS — I10 Essential (primary) hypertension: Secondary | ICD-10-CM

## 2015-06-22 DIAGNOSIS — Z780 Asymptomatic menopausal state: Secondary | ICD-10-CM

## 2015-06-22 DIAGNOSIS — Z1211 Encounter for screening for malignant neoplasm of colon: Secondary | ICD-10-CM

## 2015-06-22 DIAGNOSIS — E890 Postprocedural hypothyroidism: Secondary | ICD-10-CM

## 2015-06-22 DIAGNOSIS — D649 Anemia, unspecified: Secondary | ICD-10-CM | POA: Diagnosis not present

## 2015-06-22 DIAGNOSIS — Z0000 Encounter for general adult medical examination without abnormal findings: Secondary | ICD-10-CM

## 2015-06-22 DIAGNOSIS — Z23 Encounter for immunization: Secondary | ICD-10-CM

## 2015-06-22 DIAGNOSIS — E033 Postinfectious hypothyroidism: Secondary | ICD-10-CM

## 2015-06-22 LAB — BASIC METABOLIC PANEL
BUN: 12 mg/dL (ref 6–23)
CALCIUM: 9.6 mg/dL (ref 8.4–10.5)
CO2: 28 mEq/L (ref 19–32)
Chloride: 101 mEq/L (ref 96–112)
Creatinine, Ser: 0.77 mg/dL (ref 0.40–1.20)
GFR: 96.29 mL/min (ref >60.00)
GLUCOSE: 88 mg/dL (ref 70–99)
POTASSIUM: 3.6 meq/L (ref 3.5–5.1)
SODIUM: 136 meq/L (ref 135–145)

## 2015-06-22 LAB — CBC WITH DIFFERENTIAL/PLATELET
BASOS PCT: 2.3 % (ref 0.0–3.0)
Basophils Absolute: 0.1 10*3/uL (ref 0.0–0.1)
EOS ABS: 0.2 10*3/uL (ref 0.0–0.7)
EOS PCT: 3.8 % (ref 0.0–5.0)
HEMATOCRIT: 33.8 % — AB (ref 36.0–46.0)
HEMOGLOBIN: 11.2 g/dL — AB (ref 12.0–15.0)
LYMPHS PCT: 33.4 % (ref 12.0–46.0)
Lymphs Abs: 1.7 10*3/uL (ref 0.7–4.0)
MCHC: 33.3 g/dL (ref 30.0–36.0)
MCV: 89.5 fl (ref 78.0–100.0)
MONOS PCT: 9.8 % (ref 3.0–12.0)
Monocytes Absolute: 0.5 10*3/uL (ref 0.1–1.0)
NEUTROS ABS: 2.6 10*3/uL (ref 1.4–7.7)
Neutrophils Relative %: 50.7 % (ref 43.0–77.0)
PLATELETS: 244 10*3/uL (ref 150.0–400.0)
RBC: 3.78 Mil/uL — ABNORMAL LOW (ref 3.87–5.11)
RDW: 13.4 % (ref 11.5–15.5)
WBC: 5 10*3/uL (ref 4.0–10.5)

## 2015-06-22 LAB — LIPID PANEL
CHOLESTEROL: 129 mg/dL (ref 0–200)
HDL: 36.8 mg/dL — ABNORMAL LOW (ref >39.00)
LDL Cholesterol: 72 mg/dL (ref 0–99)
NonHDL: 92.02
TRIGLYCERIDES: 98 mg/dL (ref 0.0–149.0)
Total CHOL/HDL Ratio: 4
VLDL: 19.6 mg/dL (ref 0.0–40.0)

## 2015-06-22 LAB — TSH: TSH: 0.76 u[IU]/mL (ref 0.35–4.50)

## 2015-06-22 LAB — AST: AST: 22 U/L (ref 0–37)

## 2015-06-22 LAB — ALT: ALT: 27 U/L (ref 0–35)

## 2015-06-22 NOTE — Progress Notes (Signed)
Subjective:    Patient ID: Kelly Perkins, female    DOB: 08-30-1950, 65 y.o.   MRN: [id number]  DOS:  06/22/2015 Type of visit - description :    Here for Medicare AWV: 1. Risk factors based on Past M, S, F history: reviewed 2. Physical Activities:  occ. water aerobics, slt more active than last year 3. Depression/mood: neg screening   4. Hearing:  No problems noted or reported   5. ADL's: independent, drives   6. Fall Risk: no recent falls, prevention discussed   7. home Safety: does feel safe at home   8. Height, weight, & visual acuity: see VS, sees eye doctor regulalrly 9. Counseling: provided 10. Labs ordered based on risk factors: if needed   11. Referral Coordination: if needed 12. Care Plan, see assessment and plan , written personalized plan provided   13. Cognitive Assessment: motor skills and cognition appropriate for age 66. Care team updated, does not see especialists 15. End-of-life care discussed :  HC-POA discussed, MOST form provided   In addition, today we discussed the following: HTN: No ambulatory BPs, BP today is very good Hypothyroidism: Good compliance with medication. Insomnia: Ambien, well-controlled without apparent side effects GERD, symptoms well controlled  Review of Systems  Constitutional: No fever. No chills. No unexplained wt changes. No unusual sweats  HEENT: No dental problems, no ear discharge, no facial swelling, no voice changes. No eye discharge, no eye  redness , no  intolerance to light   Respiratory: No wheezing , no  difficulty breathing. No cough , no mucus production  Cardiovascular: No CP, no leg swelling , no  Palpitations  GI: no nausea, no vomiting, no diarrhea , no  abdominal pain.  No blood in the stools. No dysphagia, no odynophagia    Endocrine: No polyphagia, no polyuria , no polydipsia  GU: No dysuria, gross hematuria, difficulty urinating. No urinary urgency, no frequency.  Musculoskeletal: No joint swellings or  unusual aches or pains  Skin: No change in the color of the skin, palor , no  Rash  Allergic, immunologic: No environmental allergies , no  food allergies  Neurological: No dizziness no  syncope. No headaches. No diplopia, no slurred, no slurred speech, no motor deficits, no facial  Numbness  Hematological: No enlarged lymph nodes, no easy bruising , no unusual bleedings  Psychiatry: No suicidal ideas, no hallucinations, no beavior problems, no confusion.  No unusual/severe anxiety, no depression  Past Medical History  Diagnosis Date  . History of lithotripsy     urolthiasis 2004  . Osteoarthritis   . Hypothyroidism   . Hyperlipidemia 09/10/2010  . Normal cardiac stress test 6-12    had CP-DOE  . Hypertension     no meds  . Wears glasses   . Wears dentures     top    Past Surgical History  Procedure Laterality Date  . Hysterectomy (unknown)  2000    B oophoreectomy, d/t DUB  . Thyroidectomy    . Mass excision  09/12/2011    Procedure: EXCISION MASS;  Surgeon: Cristine Polio, MD;  Location: [facility];  Service: Plastics;  Laterality: Left;  left ring finger    Social History   Social History  . Marital Status: Single    Spouse Name: N/A  . Number of Children: 2  . Years of Education: N/A   Occupational History  . 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  Smoking status: Never Smoker   . Smokeless tobacco: Never Used  . Alcohol Use: Yes  . Drug Use: No  . Sexual Activity: Not on file   Other Topics Concern  . Not on file   Social History Narrative   Single, mom is one of my patients [name].          Family History  Problem Relation Age of Onset  . Hypertension Mother   . Coronary artery disease Mother     onset?  Marland Kitchen Hypertension Sister   . Diabetes Neg Hx   . Colon cancer Neg Hx   . Breast cancer Neg Hx   . Stroke Neg Hx        Medication List       This list is accurate as of: 06/22/15  1:03  PM.  Always use your most recent med list.               cyclobenzaprine 10 MG tablet  Commonly known as:  FLEXERIL  Take 1 tablet (10 mg total) by mouth at bedtime as needed for muscle spasms.     levothyroxine 150 MCG tablet  Commonly known as:  SYNTHROID, LEVOTHROID  Take 1 tablet (150 mcg total) by mouth daily before breakfast.     lisinopril-hydrochlorothiazide 10-12.5 MG tablet  Commonly known as:  PRINZIDE,ZESTORETIC  Take 1 tablet by mouth daily.     pantoprazole 40 MG tablet  Commonly known as:  PROTONIX  Take 1 tablet (40 mg total) by mouth daily.     simvastatin 20 MG tablet  Commonly known as:  ZOCOR  TAKE ONE TABLET BY MOUTH AT BEDTIME     zolpidem 10 MG tablet  Commonly known as:  AMBIEN  Take 1 tablet (10 mg total) by mouth at bedtime.           Objective:   Physical Exam BP 128/74 mmHg  Pulse 72  Temp(Src) 98.2 F (36.8 C) (Oral)  Ht 5\' 3"  (1.6 m)  Wt 229 lb 8 oz (104.101 kg)  BMI 40.66 kg/m2  SpO2 96% General:   Well developed, well nourished . NAD.  HEENT:  Normocephalic . Face symmetric, atraumatic Neck: No thyromegaly Breast: no dominant mass, skin and nipples normal to inspection on palpation, axillary areas without mass or lymphadenopath Lungs:  CTA B Normal respiratory effort, no intercostal retractions, no accessory muscle use. Heart: RRR,  no murmur.  no pretibial edema bilaterally  Abdomen:  Not distended, soft, non-tender. No rebound or rigidity.  Skin: Not pale. Not jaundice Neurologic:  alert & oriented X3.  Speech normal, gait appropriate for age and unassisted Psych--  Cognition and judgment appear intact.  Cooperative with normal attention span and concentration.  Behavior appropriate. No anxious or depressed appearing.    Assessment & Plan:   Assessment HTN Hyperlipidemia Insomnia-- ambien Hypothyroidism, s/p thyroidectomy GERD Mild anemia, normal iron 12-2013 DJD CP-DOE: Normal stress test  08/2010  Plan: HTN: Well-controlled, continue Zestoretic, check a BMP Hyperlipidemia: Continue simvastatin, check FLP, AST, ALT Mild anemia: Check a CBC Hypothyroidism: Check a TSH, continue Synthroid  GERD: On daily PPIs, recommend to wean off if possible  RTC 8 months

## 2015-06-22 NOTE — Assessment & Plan Note (Signed)
Td 2009; Pneumonia shot 2015; Prevnar 06-22-15;   [facility] -- [facility]  Last colonoscopy was in 2011, poor prep, was recommended to repeat a year later. Refer to GI again today. Per guidelines  no further Pap smears Mammogram  05-2015 (-). Clinical breast exam today negative Rx: Density test per guidelines Diet and exercise discussed

## 2015-06-22 NOTE — Patient Instructions (Addendum)
Get your blood work before you leave   Okay to wean off pantoprazole  Next visit in 8 months, no fasting.    Fall Prevention and Home Safety Falls cause injuries and can affect all age groups. It is possible to use preventive measures to significantly decrease the likelihood of falls. There are many simple measures which can make your home safer and prevent falls. OUTDOORS  Repair cracks and edges of walkways and driveways.  Remove high doorway thresholds.  Trim shrubbery on the main path into your home.  Have good outside lighting.  Clear walkways of tools, rocks, debris, and clutter.  Check that handrails are not broken and are securely fastened. Both sides of steps should have handrails.  Have leaves, snow, and ice cleared regularly.  Use sand or salt on walkways during winter months.  In the garage, clean up grease or oil spills. BATHROOM  Install night lights.  Install grab bars by the toilet and in the tub and shower.  Use non-skid mats or decals in the tub or shower.  Place a plastic non-slip stool in the shower to sit on, if needed.  Keep floors dry and clean up all water on the floor immediately.  Remove soap buildup in the tub or shower on a regular basis.  Secure bath mats with non-slip, double-sided rug tape.  Remove throw rugs and tripping hazards from the floors. BEDROOMS  Install night lights.  Make sure a bedside light is easy to reach.  Do not use oversized bedding.  Keep a telephone by your bedside.  Have a firm chair with side arms to use for getting dressed.  Remove throw rugs and tripping hazards from the floor. KITCHEN  Keep handles on pots and pans turned toward the center of the stove. Use back burners when possible.  Clean up spills quickly and allow time for drying.  Avoid walking on wet floors.  Avoid hot utensils and knives.  Position shelves so they are not too high or low.  Place commonly used objects within easy  reach.  If necessary, use a sturdy step stool with a grab bar when reaching.  Keep electrical cables out of the way.  Do not use floor polish or wax that makes floors slippery. If you must use wax, use non-skid floor wax.  Remove throw rugs and tripping hazards from the floor. STAIRWAYS  Never leave objects on stairs.  Place handrails on both sides of stairways and use them. Fix any loose handrails. Make sure handrails on both sides of the stairways are as long as the stairs.  Check carpeting to make sure it is firmly attached along stairs. Make repairs to worn or loose carpet promptly.  Avoid placing throw rugs at the top or bottom of stairways, or properly secure the rug with carpet tape to prevent slippage. Get rid of throw rugs, if possible.  Have an electrician put in a light switch at the top and bottom of the stairs. OTHER FALL PREVENTION TIPS  Wear low-heel or rubber-soled shoes that are supportive and fit well. Wear closed toe shoes.  When using a stepladder, make sure it is fully opened and both spreaders are firmly locked. Do not climb a closed stepladder.  Add color or contrast paint or tape to grab bars and handrails in your home. Place contrasting color strips on first and last steps.  Learn and use mobility aids as needed. Install an electrical emergency response system.  Turn on lights to avoid dark areas.  Replace light bulbs that burn out immediately. Get light switches that glow.  Arrange furniture to create clear pathways. Keep furniture in the same place.  Firmly attach carpet with non-skid or double-sided tape.  Eliminate uneven floor surfaces.  Select a carpet pattern that does not visually hide the edge of steps.  Be aware of all pets. OTHER HOME SAFETY TIPS  Set the water temperature for 120 F (48.8 C).  Keep emergency numbers on or near the telephone.  Keep smoke detectors on every level of the home and near sleeping areas. Document Released:  02/18/2002 Document Revised: 08/30/2011 Document Reviewed: 05/20/2011 [facility] Patient Information 2015 [place], [place]. This information is not intended to replace advice given to you by your health care provider. Make sure you discuss any questions you have with your health care provider.   Preventive Care for Adults Ages 10 and over  Blood pressure check.** / Every 1 to 2 years.  Lipid and cholesterol check.**/ Every 5 years beginning at age 19.  Lung cancer screening. / Every year if you are aged 69-80 years and have a 30-pack-year history of smoking and currently smoke or have quit within the past 15 years. Yearly screening is stopped once you have quit smoking for at least 15 years or develop a health problem that would prevent you from having lung cancer treatment.  Fecal occult blood test (FOBT) of stool. / Every year beginning at age 84 and continuing until age 54. You may not have to do this test if you get a colonoscopy every 10 years.  Flexible sigmoidoscopy** or colonoscopy.** / Every 5 years for a flexible sigmoidoscopy or every 10 years for a colonoscopy beginning at age 57 and continuing until age 11.  Hepatitis C blood test.** / For all people born from [birth year] through 1967 and any individual with known risks for hepatitis C.  Abdominal aortic aneurysm (AAA) screening.** / A one-time screening for ages 41 to 79 years who are current or former smokers.  Skin self-exam. / Monthly.  Influenza vaccine. / Every year.  Tetanus, diphtheria, and acellular pertussis (Tdap/Td) vaccine.** / 1 dose of Td every 10 years.  Varicella vaccine.** / Consult your health care provider.  Zoster vaccine.** / 1 dose for adults aged 76 years or older.  Pneumococcal 13-valent conjugate (PCV13) vaccine.** / Consult your health care provider.  Pneumococcal polysaccharide (PPSV23) vaccine.** / 1 dose for all adults aged 21 years and older.  Meningococcal vaccine.** / Consult your health care  provider.  Hepatitis A vaccine.** / Consult your health care provider.  Hepatitis B vaccine.** / Consult your health care provider.  Haemophilus influenzae type b (Hib) vaccine.** / Consult your health care provider. **Family history and personal history of risk and conditions may change your health care provider's recommendations. Document Released: 04/26/2001 Document Revised: 03/05/2013 Document Reviewed: 07/26/2010 [facility] Patient Information 2015 [place], [place]. This information is not intended to replace advice given to you by your health care provider. Make sure you discuss any questions you have with your health care provider.

## 2015-06-22 NOTE — Assessment & Plan Note (Signed)
HTN: Well-controlled, continue Zestoretic, check a BMP Hyperlipidemia: Continue simvastatin, check FLP, AST, ALT Mild anemia: Check a CBC Hypothyroidism: Check a TSH, continue Synthroid  GERD: On daily PPIs, recommend to wean off if possible  RTC 8 months

## 2015-06-22 NOTE — Progress Notes (Signed)
Pre visit review using our clinic review tool, if applicable. No additional management support is needed unless otherwise documented below in the visit note. 

## 2015-06-23 ENCOUNTER — Ambulatory Visit (HOSPITAL_BASED_OUTPATIENT_CLINIC_OR_DEPARTMENT_OTHER)
Admission: RE | Admit: 2015-06-23 | Discharge: 2015-06-23 | Disposition: A | Discharge status: Home / Self Care | Payer: Commercial Managed Care - HMO | Source: Ambulatory Visit | Attending: Internal Medicine | Admitting: Internal Medicine

## 2015-06-23 DIAGNOSIS — Z780 Asymptomatic menopausal state: Secondary | ICD-10-CM | POA: Diagnosis not present

## 2015-06-23 DIAGNOSIS — G710 Muscular dystrophy: Secondary | ICD-10-CM | POA: Insufficient documentation

## 2015-06-23 DIAGNOSIS — Z13820 Encounter for screening for osteoporosis: Secondary | ICD-10-CM | POA: Diagnosis not present

## 2015-06-24 DIAGNOSIS — Z13820 Encounter for screening for osteoporosis: Secondary | ICD-10-CM | POA: Diagnosis not present

## 2015-06-24 DIAGNOSIS — Z780 Asymptomatic menopausal state: Secondary | ICD-10-CM | POA: Diagnosis not present

## 2015-06-25 ENCOUNTER — Other Ambulatory Visit: Payer: Self-pay | Admitting: Internal Medicine

## 2015-07-16 ENCOUNTER — Other Ambulatory Visit: Payer: Self-pay | Admitting: Internal Medicine

## 2015-08-17 ENCOUNTER — Other Ambulatory Visit: Payer: Self-pay | Admitting: Internal Medicine

## 2015-08-19 ENCOUNTER — Ambulatory Visit (AMBULATORY_SURGERY_CENTER): Payer: Self-pay | Admitting: *Deleted

## 2015-08-19 VITALS — Ht 62.5 in | Wt 220.0 lb

## 2015-08-19 DIAGNOSIS — Z86010 Personal history of colonic polyps: Secondary | ICD-10-CM

## 2015-08-19 MED ORDER — NA SULFATE-K SULFATE-MG SULF 17.5-3.13-1.6 GM/177ML PO SOLN: 1.0000 | Freq: Once | ORAL | Status: DC

## 2015-08-19 NOTE — Progress Notes (Signed)
No egg or soy allergy known to patient  No issues with past sedation with any surgeries  or procedures, no intubation problems  No diet pills per patient No home 02 use per patient  No blood thinners per patient  Pt denies issues with constipation  emmi declined 2011 colon poor prep- per Dr Henrene Pastor 2 day prep

## 2015-08-20 ENCOUNTER — Encounter: Payer: Self-pay | Admitting: Internal Medicine

## 2015-08-22 ENCOUNTER — Other Ambulatory Visit: Payer: Self-pay | Admitting: Internal Medicine

## 2015-08-24 NOTE — Telephone Encounter (Signed)
Ok 30 and 5 RF 

## 2015-08-24 NOTE — Telephone Encounter (Signed)
Rx printed, awaiting MD signature.  

## 2015-08-24 NOTE — Telephone Encounter (Signed)
[facility]  

## 2015-08-24 NOTE — Telephone Encounter (Signed)
Pt is requesting refill on Ambien.  Last OV: 06/22/2015 Last Fill: 01/29/2015 #30 and 5RF UDS: Not needed  Please advise.

## 2015-08-28 ENCOUNTER — Telehealth: Payer: Self-pay | Admitting: Internal Medicine

## 2015-08-28 NOTE — Telephone Encounter (Signed)
Spoke with patient and told her I would leave a sample Suprep up front for her to pick up.  Patient agreed

## 2015-09-02 ENCOUNTER — Encounter: Payer: Self-pay | Admitting: Internal Medicine

## 2015-09-02 ENCOUNTER — Ambulatory Visit (AMBULATORY_SURGERY_CENTER): Payer: Commercial Managed Care - HMO | Admitting: Internal Medicine

## 2015-09-02 VITALS — BP 131/75 | HR 74 | Temp 97.3°F | Resp 13 | Ht 62.5 in | Wt 220.0 lb

## 2015-09-02 DIAGNOSIS — Z86010 Personal history of colonic polyps: Secondary | ICD-10-CM

## 2015-09-02 DIAGNOSIS — Z1211 Encounter for screening for malignant neoplasm of colon: Secondary | ICD-10-CM | POA: Diagnosis not present

## 2015-09-02 MED ORDER — SODIUM CHLORIDE 0.9 % IV SOLN: 500.0000 mL | INTRAVENOUS | Status: DC

## 2015-09-02 NOTE — Progress Notes (Signed)
A and O x3. Report to RN. Tolerated MAC anesthesia well. 

## 2015-09-02 NOTE — Patient Instructions (Signed)
YOU HAD AN ENDOSCOPIC PROCEDURE TODAY AT [facility]:   Refer to the procedure report that was given to you for any specific questions about what was found during the examination.  If the procedure report does not answer your questions, please call your gastroenterologist to clarify.  If you requested that your care partner not be given the details of your procedure findings, then the procedure report has been included in a sealed envelope for you to review at your convenience later.  YOU SHOULD EXPECT: Some feelings of bloating in the abdomen. Passage of more gas than usual.  Walking can help get rid of the air that was put into your GI tract during the procedure and reduce the bloating. If you had a lower endoscopy (such as a colonoscopy or flexible sigmoidoscopy) you may notice spotting of blood in your stool or on the toilet paper. If you underwent a bowel prep for your procedure, you may not have a normal bowel movement for a few days.  Please Note:  You might notice some irritation and congestion in your nose or some drainage.  This is from the oxygen used during your procedure.  There is no need for concern and it should clear up in a day or so.  SYMPTOMS TO REPORT IMMEDIATELY:   Following lower endoscopy (colonoscopy or flexible sigmoidoscopy):  Excessive amounts of blood in the stool  Significant tenderness or worsening of abdominal pains  Swelling of the abdomen that is new, acute  Fever of 100F or higher  For urgent or emergent issues, a gastroenterologist can be reached at any hour by calling [phone].   DIET: Your first meal following the procedure should be a small meal and then it is ok to progress to your normal diet. Heavy or fried foods are harder to digest and may make you feel nauseous or bloated.  Likewise, meals heavy in dairy and vegetables can increase bloating.  Drink plenty of fluids but you should avoid alcoholic beverages for 24  hours.  ACTIVITY:  You should plan to take it easy for the rest of today and you should NOT DRIVE or use heavy machinery until tomorrow (because of the sedation medicines used during the test).    FOLLOW UP: Our staff will call the number listed on your records the next business day following your procedure to check on you and address any questions or concerns that you may have regarding the information given to you following your procedure. If we do not reach you, we will leave a message.  However, if you are feeling well and you are not experiencing any problems, there is no need to return our call.  We will assume that you have returned to your regular daily activities without incident.  If any biopsies were taken you will be contacted by phone or by letter within the next 1-3 weeks.  Please call us at [phone] if you have not heard about the biopsies in 3 weeks.    SIGNATURES/CONFIDENTIALITY: You and/or your care partner have signed paperwork which will be entered into your electronic medical record.  These signatures attest to the fact that that the information above on your After Visit Summary has been reviewed and is understood.  Full responsibility of the confidentiality of this discharge information lies with you and/or your care-partner.   Repeat colonoscopy in 5 years 2022

## 2015-09-02 NOTE — Op Note (Addendum)
[facility] Patient Name: Kelly Perkins Procedure Date: 09/02/2015 9:17 AM MRN: [id number] Endoscopist: Docia Chuck. Henrene Pastor , MD Age: 65 Referring MD:  Date of Birth: 1951/01/13 Gender: Female Account #: [id number] Procedure:                Colonoscopy Indications:              High risk colon cancer surveillance: Personal                            history of non-advanced adenoma, . Prior                            examination 2004 with tubular adenoma (fair prep);                            2011 (poor prep), overdue for follow-up. Medicines:                Monitored Anesthesia Care Procedure:                Pre-Anesthesia Assessment:                           - Prior to the procedure, a History and Physical                            was performed, and patient medications and                            allergies were reviewed. The patient's tolerance of                            previous anesthesia was also reviewed. The risks                            and benefits of the procedure and the sedation                            options and risks were discussed with the patient.                            All questions were answered, and informed consent                            was obtained. Prior Anticoagulants: The patient has                            taken no previous anticoagulant or antiplatelet                            agents. ASA Grade Assessment: II - A patient with                            mild systemic disease. After reviewing the risks  and benefits, the patient was deemed in                            satisfactory condition to undergo the procedure.                           After obtaining informed consent, the colonoscope                            was passed under direct vision. Throughout the                            procedure, the patient's blood pressure, pulse, and                            oxygen saturations were monitored  continuously. The                            Model PCF-[id number]) scope was introduced                            through the anus and advanced to the the cecum,                            identified by appendiceal orifice and ileocecal                            valve. The ileocecal valve, appendiceal orifice,                            and rectum were photographed. The quality of the                            bowel preparation was adequate to identify polyps 6                            mm and larger in size(dense vegetation in some                            areas). The colonoscopy was performed without                            difficulty. The patient tolerated the procedure                            well. The bowel preparation used was , 2 days of                            clears, magnesium citrate, andSUPREP. Scope In: 9:27:22 AM Scope Out: 9:43:38 AM Scope Withdrawal Time: 0 hours 13 minutes 4 seconds  Total Procedure Duration: 0 hours 16 minutes 16 seconds  Findings:                 The entire examined colon appeared normal on  direct                            and retroflexion views. Complications:            No immediate complications. Estimated blood loss:                            None. Estimated Blood Loss:     Estimated blood loss: none. Impression:               - The entire examined colon is normal on direct and                            retroflexion views.                           - No specimens collected. Recommendation:           - Repeat colonoscopy in 5 years for screening (2                            day prep, avoid vegetation).                           - Patient has a contact number available for                            emergencies. The signs and symptoms of potential                            delayed complications were discussed with the                            patient. Return to normal activities tomorrow.                             Written discharge instructions were provided to the                            patient.                           - Resume previous diet.                           - Continue present medications. Docia Chuck. Henrene Pastor, MD 09/02/2015 9:52:10 AM This report has been signed electronically. CC Letter to:             Kathlene November, MD

## 2015-09-03 ENCOUNTER — Telehealth: Payer: Self-pay | Admitting: *Deleted

## 2015-09-03 NOTE — Telephone Encounter (Signed)
Message left

## 2015-10-25 ENCOUNTER — Other Ambulatory Visit: Payer: Self-pay | Admitting: Internal Medicine

## 2015-10-26 NOTE — Telephone Encounter (Signed)
Rx sent 

## 2015-10-26 NOTE — Telephone Encounter (Signed)
Pt is requesting refill on Flexeril.  Last OV: 06/22/2015 Last Fill: 06/18/2015 #30 and 3RF  Please advise.

## 2015-10-26 NOTE — Telephone Encounter (Signed)
Okay 30 and 3 refills 

## 2016-02-22 ENCOUNTER — Encounter: Payer: Self-pay | Admitting: Internal Medicine

## 2016-02-22 ENCOUNTER — Ambulatory Visit (INDEPENDENT_AMBULATORY_CARE_PROVIDER_SITE_OTHER): Payer: Commercial Managed Care - HMO | Admitting: Internal Medicine

## 2016-02-22 VITALS — BP 124/78 | HR 90 | Temp 97.4°F | Resp 14 | Ht 63.0 in | Wt 224.0 lb

## 2016-02-22 DIAGNOSIS — I10 Essential (primary) hypertension: Secondary | ICD-10-CM

## 2016-02-22 DIAGNOSIS — E039 Hypothyroidism, unspecified: Secondary | ICD-10-CM | POA: Diagnosis not present

## 2016-02-22 DIAGNOSIS — E785 Hyperlipidemia, unspecified: Secondary | ICD-10-CM

## 2016-02-22 LAB — BASIC METABOLIC PANEL
BUN: 19 mg/dL (ref 6–23)
CHLORIDE: 101 meq/L (ref 96–112)
CO2: 28 meq/L (ref 19–32)
CREATININE: 0.76 mg/dL (ref 0.40–1.20)
Calcium: 9.5 mg/dL (ref 8.4–10.5)
GFR: 97.55 mL/min (ref >60.00)
Glucose, Bld: 105 mg/dL — ABNORMAL HIGH (ref 70–99)
POTASSIUM: 3.9 meq/L (ref 3.5–5.1)
Sodium: 139 mEq/L (ref 135–145)

## 2016-02-22 MED ORDER — CYCLOBENZAPRINE HCL 10 MG PO TABS: 10.0000 mg | ORAL_TABLET | Freq: Every evening | ORAL | 5 refills | Status: DC | PRN

## 2016-02-22 MED ORDER — PANTOPRAZOLE SODIUM 40 MG PO TBEC: 40.0000 mg | DELAYED_RELEASE_TABLET | Freq: Every day | ORAL | 5 refills | Status: DC

## 2016-02-22 MED ORDER — LISINOPRIL-HYDROCHLOROTHIAZIDE 10-12.5 MG PO TABS: 1.0000 | ORAL_TABLET | Freq: Every day | ORAL | 5 refills | Status: DC

## 2016-02-22 MED ORDER — LEVOTHYROXINE SODIUM 150 MCG PO TABS: 150.0000 ug | ORAL_TABLET | Freq: Every day | ORAL | 5 refills | Status: DC

## 2016-02-22 MED ORDER — SIMVASTATIN 20 MG PO TABS: 20.0000 mg | ORAL_TABLET | Freq: Every day | ORAL | 5 refills | Status: DC

## 2016-02-22 MED ORDER — ZOLPIDEM TARTRATE 10 MG PO TABS: 10.0000 mg | ORAL_TABLET | Freq: Every day | ORAL | 5 refills | Status: DC

## 2016-02-22 NOTE — Patient Instructions (Signed)
GO TO THE LAB : Get the blood work     GO TO THE FRONT DESK Schedule your next appointment for a  Physical exam by 06-2016

## 2016-02-22 NOTE — Progress Notes (Signed)
Pre visit review using our clinic review tool, if applicable. No additional management support is needed unless otherwise documented below in the visit note. 

## 2016-02-22 NOTE — Progress Notes (Signed)
Subjective:    Patient ID: Kelly Perkins, female    DOB: 10/04/1950, 65 y.o.   MRN: [id number]  DOS:  02/22/2016 Type of visit - description : Routine checkup Interval history: Feeling well, would like all her medications refills. She has chronic nocturnal leg cramps, Flexeril does not help much. Denies daytime symptoms, claudication or cramps, not  improving with "shaking" her legs   Review of Systems Having a cold for the last 4 days, some cough and chest congestion, no fever. Taking Mucinex. Symptoms decreasing.  Past Medical History:  Diagnosis Date  . GERD (gastroesophageal reflux disease)   . History of lithotripsy    urolthiasis 2004  . Hyperlipidemia 09/10/2010  . Hypertension    no meds  . Hypothyroidism   . Kidney stones   . Normal cardiac stress test 6-12   had CP-DOE  . Osteoarthritis   . Wears dentures    top  . Wears glasses     Past Surgical History:  Procedure Laterality Date  . COLONOSCOPY     [id number]  . hysterectomy (unknown)  2000   B oophoreectomy, d/t DUB  . MASS EXCISION  09/12/2011   Procedure: EXCISION MASS;  Surgeon: Cristine Polio, MD;  Location: [facility];  Service: Plastics;  Laterality: Left;  left ring finger  . POLYPECTOMY     TA 2004  . THYROIDECTOMY      Social History   Social History  . Marital status: Single    Spouse name: N/A  . Number of children: 2  . Years of education: N/A   Occupational History  . 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 Smoking status: Never Smoker  . Smokeless tobacco: Never Used  . Alcohol use 0.0 oz/week     Comment: socially  . Drug use: No  . Sexual activity: Not on file   Other Topics Concern  . Not on file   Social History Narrative   Single, mom is one of my patients [name].             Medication List       Accurate as of 02/22/16 11:59 PM. Always use your most recent med list.          cyclobenzaprine 10 MG  tablet Commonly known as:  FLEXERIL Take 1 tablet (10 mg total) by mouth at bedtime as needed for muscle spasms.   levothyroxine 150 MCG tablet Commonly known as:  SYNTHROID, LEVOTHROID Take 1 tablet (150 mcg total) by mouth daily before breakfast.   lisinopril-hydrochlorothiazide 10-12.5 MG tablet Commonly known as:  PRINZIDE,ZESTORETIC Take 1 tablet by mouth daily.   pantoprazole 40 MG tablet Commonly known as:  PROTONIX Take 1 tablet (40 mg total) by mouth daily.   simvastatin 20 MG tablet Commonly known as:  ZOCOR Take 1 tablet (20 mg total) by mouth at bedtime.   zolpidem 10 MG tablet Commonly known as:  AMBIEN Take 1 tablet (10 mg total) by mouth at bedtime.          Objective:   Physical Exam BP 124/78 (BP Location: Left Arm, Patient Position: Sitting, Cuff Size: Normal)   Pulse 90   Temp 97.4 F (36.3 C) (Oral)   Resp 14   Ht 5\' 3"  (1.6 m)   Wt 224 lb (101.6 kg)   SpO2 97%   BMI 39.68 kg/m  General:   Well developed, well nourished . NAD.  HEENT:  Normocephalic . Face symmetric, atraumatic. TMs normal, throat symmetric, nose not congested. Lungs:  Few  rhonchi, no crackles. Normal respiratory effort, no intercostal retractions, no accessory muscle use. Heart: RRR,  no murmur.  No pretibial edema bilaterally  Skin: Not pale. Not jaundice Neurologic:  alert & oriented X3.  Speech normal, gait appropriate for age and unassisted Psych--  Cognition and judgment appear intact.  Cooperative with normal attention span and concentration.  Behavior appropriate. No anxious or depressed appearing.      Assessment & Plan:   Assessment HTN Hyperlipidemia Insomnia-- ambien Hypothyroidism, s/p thyroidectomy GERD Mild anemia, normal iron 12-2013, cs cope 08-2015 DJD CP-DOE: Normal stress test 08/2010  PLAN: HTN: Check a BMP, refill Zestoretic. Hyperlipidemia: RF simvastatin Hypothyroidism: RF Synthroid Leg cramps: Flexeril does not help much, recommend  a trial with tonic water URI: Already improving, call if not completely well in few days. RTC 06-2016, CPX

## 2016-02-23 NOTE — Assessment & Plan Note (Signed)
HTN: Check a BMP, refill Zestoretic. Hyperlipidemia: RF simvastatin Hypothyroidism: RF Synthroid Leg cramps: Flexeril does not help much, recommend a trial with tonic water URI: Already improving, call if not completely well in few days. RTC 06-2016, CPX

## 2016-05-17 ENCOUNTER — Telehealth: Payer: Self-pay

## 2016-05-17 NOTE — Telephone Encounter (Signed)
PA initiated via Covermymeds; KEY: [id number]. Received PA approval.

## 2016-05-18 NOTE — Telephone Encounter (Signed)
PA approval letter received. Cyclobenzaprine approved through 05/18/2018. Letter sent for scanning.

## 2016-05-28 ENCOUNTER — Other Ambulatory Visit (INDEPENDENT_AMBULATORY_CARE_PROVIDER_SITE_OTHER): Payer: Self-pay | Admitting: Physician Assistant

## 2016-06-14 ENCOUNTER — Other Ambulatory Visit: Payer: Self-pay | Admitting: Internal Medicine

## 2016-06-14 DIAGNOSIS — Z1231 Encounter for screening mammogram for malignant neoplasm of breast: Secondary | ICD-10-CM

## 2016-06-15 ENCOUNTER — Telehealth: Payer: Self-pay | Admitting: *Deleted

## 2016-06-15 NOTE — Telephone Encounter (Signed)
AWV scheduled 06/24/16 @1030 

## 2016-06-16 ENCOUNTER — Ambulatory Visit
Admission: RE | Admit: 2016-06-16 | Discharge: 2016-06-16 | Disposition: A | Discharge status: Home / Self Care | Payer: Medicare HMO | Source: Ambulatory Visit | Attending: Internal Medicine | Admitting: Internal Medicine

## 2016-06-16 DIAGNOSIS — Z1231 Encounter for screening mammogram for malignant neoplasm of breast: Secondary | ICD-10-CM | POA: Diagnosis not present

## 2016-06-24 ENCOUNTER — Encounter: Payer: Self-pay | Admitting: Internal Medicine

## 2016-06-24 ENCOUNTER — Ambulatory Visit (HOSPITAL_BASED_OUTPATIENT_CLINIC_OR_DEPARTMENT_OTHER)
Admission: RE | Admit: 2016-06-24 | Discharge: 2016-06-24 | Disposition: A | Discharge status: Home / Self Care | Payer: Medicare HMO | Source: Ambulatory Visit | Attending: Internal Medicine | Admitting: Internal Medicine

## 2016-06-24 ENCOUNTER — Ambulatory Visit (INDEPENDENT_AMBULATORY_CARE_PROVIDER_SITE_OTHER): Payer: Medicare HMO | Admitting: Internal Medicine

## 2016-06-24 VITALS — BP 132/80 | HR 103 | Temp 98.3°F | Resp 14 | Ht 63.0 in | Wt 225.5 lb

## 2016-06-24 DIAGNOSIS — S6991XA Unspecified injury of right wrist, hand and finger(s), initial encounter: Secondary | ICD-10-CM | POA: Diagnosis not present

## 2016-06-24 DIAGNOSIS — R945 Abnormal results of liver function studies: Secondary | ICD-10-CM

## 2016-06-24 DIAGNOSIS — Z1159 Encounter for screening for other viral diseases: Secondary | ICD-10-CM

## 2016-06-24 DIAGNOSIS — Z0000 Encounter for general adult medical examination without abnormal findings: Secondary | ICD-10-CM | POA: Diagnosis not present

## 2016-06-24 DIAGNOSIS — W19XXXA Unspecified fall, initial encounter: Secondary | ICD-10-CM

## 2016-06-24 DIAGNOSIS — R7989 Other specified abnormal findings of blood chemistry: Secondary | ICD-10-CM

## 2016-06-24 DIAGNOSIS — D649 Anemia, unspecified: Secondary | ICD-10-CM | POA: Diagnosis not present

## 2016-06-24 DIAGNOSIS — E039 Hypothyroidism, unspecified: Secondary | ICD-10-CM

## 2016-06-24 DIAGNOSIS — T1490XA Injury, unspecified, initial encounter: Secondary | ICD-10-CM | POA: Diagnosis not present

## 2016-06-24 DIAGNOSIS — M19041 Primary osteoarthritis, right hand: Secondary | ICD-10-CM | POA: Diagnosis not present

## 2016-06-24 DIAGNOSIS — M79641 Pain in right hand: Secondary | ICD-10-CM | POA: Insufficient documentation

## 2016-06-24 LAB — FERRITIN: FERRITIN: 163.9 ng/mL (ref 10.0–291.0)

## 2016-06-24 LAB — COMPREHENSIVE METABOLIC PANEL
ALBUMIN: 3.9 g/dL (ref 3.5–5.2)
ALK PHOS: 103 U/L (ref 39–117)
ALT: 102 U/L — AB (ref 0–35)
AST: 45 U/L — ABNORMAL HIGH (ref 0–37)
BILIRUBIN TOTAL: 0.6 mg/dL (ref 0.2–1.2)
BUN: 16 mg/dL (ref 6–23)
CO2: 28 mEq/L (ref 19–32)
Calcium: 9.8 mg/dL (ref 8.4–10.5)
Chloride: 104 mEq/L (ref 96–112)
Creatinine, Ser: 0.63 mg/dL (ref 0.40–1.20)
GFR: 121.01 mL/min (ref >60.00)
GLUCOSE: 115 mg/dL — AB (ref 70–99)
POTASSIUM: 3.6 meq/L (ref 3.5–5.1)
SODIUM: 138 meq/L (ref 135–145)
TOTAL PROTEIN: 7.4 g/dL (ref 6.0–8.3)

## 2016-06-24 LAB — CBC WITH DIFFERENTIAL/PLATELET
BASOS PCT: 0.8 % (ref 0.0–3.0)
Basophils Absolute: 0 10*3/uL (ref 0.0–0.1)
EOS PCT: 4.7 % (ref 0.0–5.0)
Eosinophils Absolute: 0.2 10*3/uL (ref 0.0–0.7)
HCT: 33.8 % — ABNORMAL LOW (ref 36.0–46.0)
HEMOGLOBIN: 11.2 g/dL — AB (ref 12.0–15.0)
LYMPHS PCT: 33.4 % (ref 12.0–46.0)
Lymphs Abs: 1.5 10*3/uL (ref 0.7–4.0)
MCHC: 33 g/dL (ref 30.0–36.0)
MCV: 91.7 fl (ref 78.0–100.0)
MONOS PCT: 15.8 % — AB (ref 3.0–12.0)
Monocytes Absolute: 0.7 10*3/uL (ref 0.1–1.0)
Neutro Abs: 2.1 10*3/uL (ref 1.4–7.7)
Neutrophils Relative %: 45.3 % (ref 43.0–77.0)
Platelets: 207 10*3/uL (ref 150.0–400.0)
RBC: 3.69 Mil/uL — AB (ref 3.87–5.11)
RDW: 14.5 % (ref 11.5–15.5)
WBC: 4.6 10*3/uL (ref 4.0–10.5)

## 2016-06-24 LAB — IRON: IRON: 84 ug/dL (ref 42–145)

## 2016-06-24 LAB — LIPID PANEL
CHOLESTEROL: 125 mg/dL (ref 0–200)
HDL: 44.6 mg/dL (ref >39.00)
LDL Cholesterol: 67 mg/dL (ref 0–99)
NONHDL: 80.3
Total CHOL/HDL Ratio: 3
Triglycerides: 68 mg/dL (ref 0.0–149.0)
VLDL: 13.6 mg/dL (ref 0.0–40.0)

## 2016-06-24 LAB — TSH: TSH: 0.04 u[IU]/mL — AB (ref 0.35–4.50)

## 2016-06-24 LAB — HEPATITIS C ANTIBODY: HCV Ab: NEGATIVE

## 2016-06-24 NOTE — Patient Instructions (Addendum)
GO TO THE LAB : Get the blood work     GO TO THE FRONT DESK Schedule your next appointment for a  routine checkup in 6 months   STOP BY THE FIRST FLOOR:  get the XR

## 2016-06-24 NOTE — Progress Notes (Deleted)
Subjective:   Kelly Perkins is a 66 y.o. female who presents for Medicare Annual (Subsequent) preventive examination.  Review of Systems:  No ROS.  Medicare Wellness Visit.     Sleep patterns: {SX; SLEEP PATTERNS:18802::"feels rested on waking","does not get up to void","gets up *** times nightly to void","sleeps *** hours nightly"}.   Home Safety/Smoke Alarms:   Living environment; residence and Firearm Safety: {Rehab home environment / accessibility:30080::"no firearms","firearms stored safely"}. Seat Belt Safety/Bike Helmet: Wears seat belt.   Counseling:   Eye Exam-  Dental-  Female:   Pap- N/A, aged out     Mammo- last 06/16/16. BI-RADS CATEGORY  1: Negative.      Dexa scan- last 06/24/15. Normal.        CCS- last 09/03/15. Normal. 5 year recall.      Objective:     Vitals: There were no vitals taken for this visit.  There is no height or weight on file to calculate BMI.   Tobacco History  Smoking Status  . Never Smoker  Smokeless Tobacco  . Never Used     Counseling given: Not Answered   Past Medical History:  Diagnosis Date  . GERD (gastroesophageal reflux disease)   . History of lithotripsy    urolthiasis 2004  . Hyperlipidemia 09/10/2010  . Hypertension    no meds  . Hypothyroidism   . Kidney stones   . Normal cardiac stress test 6-12   had CP-DOE  . Osteoarthritis   . Wears dentures    top  . Wears glasses    Past Surgical History:  Procedure Laterality Date  . COLONOSCOPY     [id number]  . hysterectomy (unknown)  2000   B oophoreectomy, d/t DUB  . MASS EXCISION  09/12/2011   Procedure: EXCISION MASS;  Surgeon: Cristine Polio, MD;  Location: [facility];  Service: Plastics;  Laterality: Left;  left ring finger  . POLYPECTOMY     TA 2004  . THYROIDECTOMY     Family History  Problem Relation Age of Onset  . Hypertension Mother   . Coronary artery disease Mother     onset?  Marland Kitchen Hypertension Sister   . Colon polyps Other    niece  . Diabetes Neg Hx   . Colon cancer Neg Hx   . Breast cancer Neg Hx   . Stroke Neg Hx   . Rectal cancer Neg Hx   . Stomach cancer Neg Hx    History  Sexual Activity  . Sexual activity: Not on file    Outpatient Encounter Prescriptions as of 06/24/2016  Medication Sig  . cyclobenzaprine (FLEXERIL) 10 MG tablet Take 1 tablet (10 mg total) by mouth at bedtime as needed for muscle spasms.  Marland Kitchen levothyroxine (SYNTHROID, LEVOTHROID) 150 MCG tablet Take 1 tablet (150 mcg total) by mouth daily before breakfast.  . lisinopril-hydrochlorothiazide (PRINZIDE,ZESTORETIC) 10-12.5 MG tablet Take 1 tablet by mouth daily.  . meloxicam (MOBIC) 15 MG tablet TAKE 1 TABLET BY MOUTH ONCE DAILY AS NEEDED.  Marland Kitchen pantoprazole (PROTONIX) 40 MG tablet Take 1 tablet (40 mg total) by mouth daily.  . simvastatin (ZOCOR) 20 MG tablet Take 1 tablet (20 mg total) by mouth at bedtime.  Marland Kitchen zolpidem (AMBIEN) 10 MG tablet Take 1 tablet (10 mg total) by mouth at bedtime.   No facility-administered encounter medications on file as of 06/24/2016.     Activities of Daily Living In your present state of health, do you have any difficulty performing  the following activities: 02/22/2016  Hearing? N  Vision? N  Difficulty concentrating or making decisions? N  Walking or climbing stairs? N  Dressing or bathing? N  Doing errands, shopping? N  Some recent data might be hidden    Patient Care Team: Colon Branch, MD as PCP - General    Assessment:    *** Exercise Activities and Dietary recommendations    Goals    None     Fall Risk Fall Risk  02/22/2016 06/22/2015 10/16/2014 06/09/2014  Falls in the past year? No No No No   Depression Screen PHQ 2/9 Scores 02/22/2016 06/22/2015 10/16/2014 06/09/2014  PHQ - 2 Score 0 0 0 0     Cognitive Function        Immunization History  Administered Date(s) Administered  . Influenza Split 12/23/2013, 11/06/2014  . Influenza-Unspecified 12/11/2015  . Pneumococcal  Conjugate-13 06/22/2015  . Pneumococcal Polysaccharide-23 01/03/2014  . Td 04/13/2007  . Zoster 08/25/2014   Screening Tests Health Maintenance  Topic Date Due  . Hepatitis C Screening  1951/02/14  . INFLUENZA VACCINE  10/12/2016  . TETANUS/TDAP  04/12/2017  . MAMMOGRAM  06/16/2017  . COLONOSCOPY  09/02/2020  . DEXA SCAN  Completed  . PNA vac Low Risk Adult  Completed      Plan:   *** During the course of the visit the patient was educated and counseled about the following appropriate screening and preventive services:   Vaccines to include Pneumoccal, Influenza, Hepatitis B, Td, Zostavax, HCV  Electrocardiogram  Cardiovascular Disease  Colorectal cancer screening  Bone density screening  Diabetes screening  Glaucoma screening  Mammography/PAP  Nutrition counseling   Patient Instructions (the written plan) was given to the patient.   Dorrene German, RN  06/24/2016

## 2016-06-24 NOTE — Progress Notes (Signed)
Subjective:    Patient ID: Kelly Perkins, female    DOB: 05-07-1950, 66 y.o.   MRN: [id number]  DOS:  06/24/2016 Type of visit - description : cpx Interval history: In general feeling well but did have a fall and is hurting.   Review of Systems Was doing her daily walk few days ago, her right knee "gave up", she fell forward, landed on her knees and right hand. Has an excoriation @ hand, she is hurting all over particularly at the knees and right hand. Denies any neck or back pain or injury.   Other than above, a 14 point review of systems is negative     Past Medical History:  Diagnosis Date  . GERD (gastroesophageal reflux disease)   . History of lithotripsy    urolthiasis 2004  . Hyperlipidemia 09/10/2010  . Hypertension    no meds  . Hypothyroidism   . Kidney stones   . Normal cardiac stress test 6-12   had CP-DOE  . Osteoarthritis   . Wears dentures    top  . Wears glasses     Past Surgical History:  Procedure Laterality Date  . COLONOSCOPY     [id number]  . hysterectomy (unknown)  2000   B oophoreectomy, d/t DUB  . MASS EXCISION  09/12/2011   Procedure: EXCISION MASS;  Surgeon: Cristine Polio, MD;  Location: [facility];  Service: Plastics;  Laterality: Left;  left ring finger  . POLYPECTOMY     TA 2004  . THYROIDECTOMY      Social History   Social History  . Marital status: Single    Spouse name: N/A  . Number of children: 2  . Years of education: N/A   Occupational History  . 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 Smoking status: Never Smoker  . Smokeless tobacco: Never Used  . Alcohol use 0.0 oz/week     Comment: socially  . Drug use: No  . Sexual activity: Not on file   Other Topics Concern  . Not on file   Social History Narrative   Single, mom is one of my patients [name].          Family History  Problem Relation Age of Onset  . Hypertension Mother   . Coronary artery disease  Mother     onset?  Marland Kitchen Hypertension Sister   . Colon polyps Other     niece  . Diabetes Neg Hx   . Colon cancer Neg Hx   . Breast cancer Neg Hx   . Stroke Neg Hx   . Rectal cancer Neg Hx   . Stomach cancer Neg Hx      Allergies as of 06/24/2016   No Known Allergies     Medication List       Accurate as of 06/24/16 11:59 PM. Always use your most recent med list.          cyclobenzaprine 10 MG tablet Commonly known as:  FLEXERIL Take 1 tablet (10 mg total) by mouth at bedtime as needed for muscle spasms.   levothyroxine 150 MCG tablet Commonly known as:  SYNTHROID, LEVOTHROID Take 1 tablet (150 mcg total) by mouth daily before breakfast.   lisinopril-hydrochlorothiazide 10-12.5 MG tablet Commonly known as:  PRINZIDE,ZESTORETIC Take 1 tablet by mouth daily.   meloxicam 15 MG tablet Commonly known as:  MOBIC TAKE 1 TABLET BY MOUTH ONCE DAILY AS NEEDED.  pantoprazole 40 MG tablet Commonly known as:  PROTONIX Take 1 tablet (40 mg total) by mouth daily.   simvastatin 20 MG tablet Commonly known as:  ZOCOR Take 1 tablet (20 mg total) by mouth at bedtime.   zolpidem 10 MG tablet Commonly known as:  AMBIEN Take 1 tablet (10 mg total) by mouth at bedtime.          Objective:   Physical Exam BP 132/80 (BP Location: Right Arm, Patient Position: Sitting, Cuff Size: Normal)   Pulse (!) 103   Temp 98.3 F (36.8 C) (Oral)   Resp 14   Ht 5\' 3"  (1.6 m)   Wt 225 lb 8 oz (102.3 kg)   SpO2 93%   BMI 39.95 kg/m   General:   Well developed, well nourished . NAD.  Neck: No  thyromegaly  HEENT:  Normocephalic . Face symmetric, atraumatic Lungs:  CTA B Normal respiratory effort, no intercostal retractions, no accessory muscle use. Heart: RRR,  no murmur.  No pretibial edema bilaterally  Abdomen:  Not distended, soft, non-tender. No rebound or rigidity.   MSK: Right hand: No deformities, range of motion of the fifth finger slightly decreased, + TTP at the fifth  knuckle. Knees: Some changes consistent with DJD, range of motion slightly decreased ( hyperflexion is painful). Some ecchymosis proximal to the right knee. Patellas normal to palpation. Skin: Exposed areas without rash. Not pale. Not jaundice Neurologic:  alert & oriented X3.  Speech normal, gait appropriate for age and unassisted  Strength symmetric and appropriate for age.  Psych: Cognition and judgment appear intact.  Cooperative with normal attention span and concentration.  Behavior appropriate. No anxious or depressed appearing.    Assessment & Plan:    Assessment HTN Hyperlipidemia Insomnia-- ambien Hypothyroidism, s/p thyroidectomy GERD Mild anemia, normal iron 12-2013, cs cope 08-2015 DJD h/o kidney stones  CP-DOE: Normal stress test 08/2010  PLAN: Fall: Fall prevention discussed, is very tender at the right hand, will get a x-ray. Has knee pain, rx observation for now, call if not improving.  Has an excoriation right hand, recommend keep the area clean, dry, OTC topical antibiotic and call if any sign of infection. Encouraged to continue being active and walking 2 miles daily as soon as she gets better Other medical problems: All seem stable, we are checking labs. She is taking PPIs 3 times a week with a still very good control of symptoms. Pulses a slightly high, likely because she is in pain. History of anemia, recheck an iron and ferritin. Leg cramps better w/ tonic water  RTC 6 months

## 2016-06-24 NOTE — Assessment & Plan Note (Signed)
--  Td 2009; Pneumonia shot 2015; Prevnar 06-22-15;  [facility] -- [facility] --CCS: Last colonoscopy was in 2011, poor prep. Colonoscopy again 08/2015, no biopsies, next 5 years, 2 day prep. --Per guidelines  no further Pap smears --Mammogram 06-16-16 (-). Clinical breast exam 06-2015  negative --DEXA wnl 06-2015 -- Diet and exercise discussed. She is doing well, walks 2 miles daily

## 2016-06-26 NOTE — Assessment & Plan Note (Signed)
Fall: Fall prevention discussed, is very tender at the right hand, will get a x-ray. Has knee pain, rx observation for now, call if not improving.  Has an excoriation right hand, recommend keep the area clean, dry, OTC topical antibiotic and call if any sign of infection. Encouraged to continue being active and walking 2 miles daily as soon as she gets better Other medical problems: All seem stable, we are checking labs. She is taking PPIs 3 times a week with a still very good control of symptoms. Pulses a slightly high, likely because she is in pain. History of anemia, recheck an iron and ferritin. Leg cramps better w/ tonic water  RTC 6 months

## 2016-06-27 NOTE — Addendum Note (Signed)
Addended byDamita Dunnings D on: 06/27/2016 08:34 AM   Modules accepted: Orders

## 2016-06-28 ENCOUNTER — Telehealth: Payer: Self-pay | Admitting: Internal Medicine

## 2016-06-28 DIAGNOSIS — M25562 Pain in left knee: Principal | ICD-10-CM

## 2016-06-28 DIAGNOSIS — M25561 Pain in right knee: Secondary | ICD-10-CM

## 2016-06-28 NOTE — Telephone Encounter (Signed)
Relation to pt: self Call back number:[phone]   Reason for call:  Patient requesting referral to [facility] due to right and left knee pain,please advise

## 2016-06-29 NOTE — Telephone Encounter (Signed)
Referral placed.

## 2016-07-12 DIAGNOSIS — M25562 Pain in left knee: Secondary | ICD-10-CM | POA: Diagnosis not present

## 2016-07-12 DIAGNOSIS — M170 Bilateral primary osteoarthritis of knee: Secondary | ICD-10-CM | POA: Diagnosis not present

## 2016-07-12 DIAGNOSIS — M25561 Pain in right knee: Secondary | ICD-10-CM | POA: Diagnosis not present

## 2016-07-21 DIAGNOSIS — M1712 Unilateral primary osteoarthritis, left knee: Secondary | ICD-10-CM | POA: Diagnosis not present

## 2016-07-21 DIAGNOSIS — M25562 Pain in left knee: Secondary | ICD-10-CM | POA: Diagnosis not present

## 2016-07-25 ENCOUNTER — Other Ambulatory Visit (INDEPENDENT_AMBULATORY_CARE_PROVIDER_SITE_OTHER): Payer: Medicare HMO

## 2016-07-25 DIAGNOSIS — R7989 Other specified abnormal findings of blood chemistry: Secondary | ICD-10-CM

## 2016-07-25 DIAGNOSIS — E039 Hypothyroidism, unspecified: Secondary | ICD-10-CM

## 2016-07-25 DIAGNOSIS — R945 Abnormal results of liver function studies: Secondary | ICD-10-CM

## 2016-07-25 LAB — HEPATIC FUNCTION PANEL
ALT: 17 U/L (ref 0–35)
AST: 16 U/L (ref 0–37)
Albumin: 4 g/dL (ref 3.5–5.2)
Alkaline Phosphatase: 87 U/L (ref 39–117)
BILIRUBIN TOTAL: 0.4 mg/dL (ref 0.2–1.2)
Bilirubin, Direct: 0.1 mg/dL (ref 0.0–0.3)
Total Protein: 7.3 g/dL (ref 6.0–8.3)

## 2016-07-25 LAB — TSH: TSH: 1.1 u[IU]/mL (ref 0.35–4.50)

## 2016-07-29 ENCOUNTER — Ambulatory Visit (INDEPENDENT_AMBULATORY_CARE_PROVIDER_SITE_OTHER): Payer: Medicare HMO

## 2016-07-29 ENCOUNTER — Encounter (INDEPENDENT_AMBULATORY_CARE_PROVIDER_SITE_OTHER): Payer: Self-pay | Admitting: Orthopaedic Surgery

## 2016-07-29 ENCOUNTER — Ambulatory Visit (INDEPENDENT_AMBULATORY_CARE_PROVIDER_SITE_OTHER): Payer: Medicare HMO | Admitting: Orthopaedic Surgery

## 2016-07-29 VITALS — Ht 63.0 in | Wt 225.0 lb

## 2016-07-29 DIAGNOSIS — M1712 Unilateral primary osteoarthritis, left knee: Secondary | ICD-10-CM | POA: Diagnosis not present

## 2016-07-29 DIAGNOSIS — G8929 Other chronic pain: Secondary | ICD-10-CM

## 2016-07-29 DIAGNOSIS — M25562 Pain in left knee: Secondary | ICD-10-CM | POA: Diagnosis not present

## 2016-07-29 DIAGNOSIS — M25561 Pain in right knee: Secondary | ICD-10-CM | POA: Diagnosis not present

## 2016-07-29 DIAGNOSIS — M1711 Unilateral primary osteoarthritis, right knee: Secondary | ICD-10-CM | POA: Diagnosis not present

## 2016-07-29 MED ORDER — BUPIVACAINE HCL 0.5 % IJ SOLN
2.0000 mL | INTRAMUSCULAR | Status: AC | PRN
  Administered 2016-07-29: 2 mL via INTRA_ARTICULAR

## 2016-07-29 MED ORDER — METHYLPREDNISOLONE ACETATE 40 MG/ML IJ SUSP
40.0000 mg | INTRAMUSCULAR | Status: AC | PRN
  Administered 2016-07-29: 40 mg via INTRA_ARTICULAR

## 2016-07-29 MED ORDER — LIDOCAINE HCL 1 % IJ SOLN
2.0000 mL | INTRAMUSCULAR | Status: AC | PRN
  Administered 2016-07-29: 2 mL

## 2016-07-29 NOTE — Progress Notes (Signed)
Office Visit Note   Patient: Kelly Perkins           Date of Birth: 22-Nov-1950           MRN: [id number] Visit Date: 07/29/2016              Requested by: Colon Branch, [address] [place], [place] [address] PCP: Colon Branch, MD   Assessment & Plan: Visit Diagnoses:  1. Primary osteoarthritis of left knee   2. Primary osteoarthritis of right knee     Plan: Overall impression is advanced degenerative joint disease bilateral knees clinically worse on the left. 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 We discussed risks benefits alternatives of surgery including infection, incomplete relief pain, stiffness, DVT. She understands and wishes to proceed. She did requests cortisone injection today to give her time to think about things. We will get her scheduled for surgery in the near future at least 6 weeks from today.  Follow-Up Instructions: Return if symptoms worsen or fail to improve.   Orders:  Orders Placed This Encounter  Procedures  . XR KNEE 3 VIEW RIGHT  . XR KNEE 3 VIEW LEFT   No orders of the defined types were placed in this encounter.     Procedures: Large Joint Inj Date/Time: 07/29/2016 11:23 PM Performed by: Leandrew Koyanagi Authorized by: Leandrew Koyanagi   Consent Given by:  Patient Timeout: prior to procedure the correct patient, procedure, and site was verified   Indications:  Pain Location:  Knee Site:  R knee Prep: patient was prepped and draped in usual sterile fashion   Needle Size:  22 G Ultrasound Guidance: No   Fluoroscopic Guidance: No   Arthrogram: No   Patient tolerance:  Patient tolerated the procedure well with no immediate complications Large Joint Inj Date/Time: 07/29/2016 11:23 PM Performed by: Leandrew Koyanagi Authorized by: Leandrew Koyanagi   Consent Given by:  Patient Timeout: prior to procedure the correct patient, procedure, and site was verified   Indications:   Pain Location:  Knee Site:  R knee Prep: patient was prepped and draped in usual sterile fashion   Needle Size:  22 G Ultrasound Guidance: No   Fluoroscopic Guidance: No   Arthrogram: No   Medications:  2 mL lidocaine 1 %; 2 mL bupivacaine 0.5 %; 40 mg methylPREDNISolone acetate 40 MG/ML Patient tolerance:  Patient tolerated the procedure well with no immediate complications     Clinical Data: No additional findings.   Subjective: Chief Complaint  Patient presents with  . Right Knee - Pain  . Left Knee - Pain    Patient is a 66 year old female with 1 month history of worsening bilateral knee pain worse on the left. Her knees have been a problem for many years. Her pain is severe at all times that significantly affects her ability to perform her ADLs. She endorses swelling and popping frequently. Her knees also give way. She has tried Visco supplementations without any relief. She denies any radiation of pain or hip pain.    Review of Systems  Constitutional: Negative.   HENT: Negative.   Eyes: Negative.   Respiratory: Negative.   Cardiovascular: Negative.   Endocrine: Negative.   Musculoskeletal: Negative.   Neurological: Negative.   Hematological: Negative.   Psychiatric/Behavioral: Negative.   All other systems reviewed and are negative.    Objective: Vital Signs: Ht  5\' 3"  (1.6 m)   Wt 225 lb (102.1 kg)   BMI 39.86 kg/m   Physical Exam  Constitutional: She is oriented to person, place, and time. She appears well-developed and well-nourished.  HENT:  Head: Normocephalic and atraumatic.  Eyes: EOM are normal.  Neck: Neck supple.  Pulmonary/Chest: Effort normal.  Abdominal: Soft.  Neurological: She is alert and oriented to person, place, and time.  Skin: Skin is warm. Capillary refill takes less than 2 seconds.  Psychiatric: She has a normal mood and affect. Her behavior is normal. Judgment and thought content normal.  Nursing note and vitals  reviewed.   Ortho Exam Bilateral knee exam shows no joint effusion. She has significant crepitus. Collaterals and cruciates are stable. Specialty Comments:  No specialty comments available.  Imaging: Xr Knee 3 View Left  Result Date: 07/29/2016 Advanced DJD  Xr Knee 3 View Right  Result Date: 07/29/2016 Advanced DJD    PMFS History: Patient Active Problem List   Diagnosis Date Noted  . PCP NOTES >>>>>>>>>>> 06/22/2015  . Annual physical exam 06/09/2014  . GERD (gastroesophageal reflux disease) 06/09/2014  . Hyperlipidemia 09/10/2010  . Anemia 08/11/2010  . HTN (hypertension) 02/27/2008  . ECHOCARDIOGRAM, ABNORMAL 02/27/2008  . Hypothyroidism 04/13/2007  . INSOMNIA, CHRONIC 04/13/2007  . Osteoarthritis 04/13/2007   Past Medical History:  Diagnosis Date  . GERD (gastroesophageal reflux disease)   . History of lithotripsy    urolthiasis 2004  . Hyperlipidemia 09/10/2010  . Hypertension    no meds  . Hypothyroidism   . Kidney stones   . Normal cardiac stress test 6-12   had CP-DOE  . Osteoarthritis   . Wears dentures    top  . Wears glasses     Family History  Problem Relation Age of Onset  . Hypertension Mother   . Coronary artery disease Mother        onset?  Marland Kitchen Hypertension Sister   . Colon polyps Other        niece  . Diabetes Neg Hx   . Colon cancer Neg Hx   . Breast cancer Neg Hx   . Stroke Neg Hx   . Rectal cancer Neg Hx   . Stomach cancer Neg Hx     Past Surgical History:  Procedure Laterality Date  . COLONOSCOPY     [id number]  . hysterectomy (unknown)  2000   B oophoreectomy, d/t DUB  . MASS EXCISION  09/12/2011   Procedure: EXCISION MASS;  Surgeon: Cristine Polio, MD;  Location: [facility];  Service: Plastics;  Laterality: Left;  left ring finger  . POLYPECTOMY     TA 2004  . THYROIDECTOMY     Social History   Occupational History  . 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 Smoking status:  Never Smoker  . Smokeless tobacco: Never Used  . Alcohol use 0.0 oz/week     Comment: socially  . Drug use: No  . Sexual activity: Not on file

## 2016-08-01 ENCOUNTER — Other Ambulatory Visit: Payer: Self-pay | Admitting: Internal Medicine

## 2016-08-22 ENCOUNTER — Other Ambulatory Visit: Payer: Self-pay | Admitting: Internal Medicine

## 2016-08-24 ENCOUNTER — Other Ambulatory Visit: Payer: Self-pay | Admitting: Internal Medicine

## 2016-08-25 NOTE — Telephone Encounter (Signed)
Pt is requesting refill on Ambien 10 mg.  Last OV: 06/24/2016  Last Fill: 02/22/2016 #30 and 5RF UDS: Not needed for Ambien per PCP  Please advise.

## 2016-08-25 NOTE — Telephone Encounter (Signed)
Rx printed, awaiting MD signature.  

## 2016-08-25 NOTE — Telephone Encounter (Signed)
Ok 30 and 5 RF

## 2016-08-25 NOTE — Telephone Encounter (Signed)
[facility]

## 2016-08-31 ENCOUNTER — Other Ambulatory Visit: Payer: Self-pay | Admitting: Internal Medicine

## 2016-09-07 ENCOUNTER — Telehealth (INDEPENDENT_AMBULATORY_CARE_PROVIDER_SITE_OTHER): Payer: Self-pay

## 2016-09-07 MED ORDER — TRAMADOL HCL 50 MG PO TABS: 50.0000 mg | ORAL_TABLET | Freq: Two times a day (BID) | ORAL | 0 refills | Status: DC | PRN

## 2016-09-07 NOTE — Telephone Encounter (Signed)
IC in to patient's pharmacy with Rx

## 2016-09-07 NOTE — Telephone Encounter (Signed)
Patient called me back and we scheduled surgery for 11/17/16.  She would like to see if you can prescribe her something to help with her pain.  Please call [phone].

## 2016-09-07 NOTE — Telephone Encounter (Signed)
See note from Dr. Erlinda Hong.

## 2016-09-07 NOTE — Telephone Encounter (Signed)
Tramadol 50  

## 2016-09-08 ENCOUNTER — Telehealth (INDEPENDENT_AMBULATORY_CARE_PROVIDER_SITE_OTHER): Payer: Self-pay | Admitting: Orthopaedic Surgery

## 2016-09-08 NOTE — Telephone Encounter (Signed)
Please advise 

## 2016-09-08 NOTE — Telephone Encounter (Signed)
Has she taken tramadol?

## 2016-09-08 NOTE — Telephone Encounter (Signed)
Pt called requesting an rx for pain meds. Please give pt a call

## 2016-09-08 NOTE — Telephone Encounter (Signed)
Returned call to patient left message to return call concerning message she left on voicemail concerning medication  [phone]

## 2016-09-09 ENCOUNTER — Other Ambulatory Visit (INDEPENDENT_AMBULATORY_CARE_PROVIDER_SITE_OTHER): Payer: Self-pay | Admitting: Radiology

## 2016-09-09 NOTE — Telephone Encounter (Signed)
Per chart, Tramadol sent in 6/27, and IC patient she has not gotten it yet, she will go pickup the Rx, no Rx norco sent, I advised/discussed with Erlinda Hong

## 2016-09-09 NOTE — Telephone Encounter (Signed)
Called patient stated she has not taken tramadol before but is willing to try it.  She stated other pain medication has not helped before. [facility] [address] CB# [phone] ok to [facility].

## 2016-09-09 NOTE — Telephone Encounter (Signed)
Norco #30

## 2016-11-02 DIAGNOSIS — H5203 Hypermetropia, bilateral: Secondary | ICD-10-CM | POA: Diagnosis not present

## 2016-11-08 ENCOUNTER — Other Ambulatory Visit (INDEPENDENT_AMBULATORY_CARE_PROVIDER_SITE_OTHER): Payer: Self-pay | Admitting: Orthopaedic Surgery

## 2016-11-08 DIAGNOSIS — M1712 Unilateral primary osteoarthritis, left knee: Secondary | ICD-10-CM

## 2016-11-09 ENCOUNTER — Other Ambulatory Visit: Payer: Self-pay | Admitting: Internal Medicine

## 2016-11-11 ENCOUNTER — Other Ambulatory Visit (INDEPENDENT_AMBULATORY_CARE_PROVIDER_SITE_OTHER): Payer: Self-pay | Admitting: Orthopaedic Surgery

## 2016-11-11 ENCOUNTER — Other Ambulatory Visit: Payer: Self-pay

## 2016-11-11 MED ORDER — SIMVASTATIN 20 MG PO TABS: 20.0000 mg | ORAL_TABLET | Freq: Every day | ORAL | 1 refills | Status: DC

## 2016-11-11 NOTE — Telephone Encounter (Signed)
Was sent in on 6.14.18 for 30d+5 additional refills for Ambien/denied and will await pharm response after they review records/thx dmf

## 2016-11-15 NOTE — Pre-Procedure Instructions (Signed)
Kelly Perkins  11/15/2016      [facility] [address], [place] [address] Phone: [phone] Fax: [phone]    Your procedure is scheduled on September 6  Report to [facility] at 1015 A.M.  Call this number if you have problems the morning of surgery:  [phone]   Remember:  Do not eat food or drink liquids after midnight.  Continue all other medications as directed by your physician except follow these instructions about you medications   Take these medicines the morning of surgery with A SIP OF WATER  cyclobenzaprine (FLEXERIL) if needed levothyroxine (SYNTHROID, LEVOTHROID) pantoprazole (PROTONIX) traMADol (ULTRAM)   7 days prior to surgery STOP taking any Aspirin, Aleve, Naproxen, Ibuprofen, Motrin, Advil, Goody's, BC's, all herbal medications, fish oil, and all vitamins, meloxicam (MOBIC)     Do not wear jewelry, make-up or nail polish.  Do not wear lotions, powders, or perfumes, or deoderant.  Do not shave 48 hours prior to surgery.  Men may shave face and neck.  Do not bring valuables to the hospital.  [facility] is not responsible for any belongings or valuables.  Contacts, dentures or bridgework may not be worn into surgery.  Leave your suitcase in the car.  After surgery it may be brought to your room.  For patients admitted to the hospital, discharge time will be determined by your treatment team.  Patients discharged the day of surgery will not be allowed to drive home.    Special instructions:   [facility]- Preparing For Surgery  Before surgery, you can play an important role. Because skin is not sterile, your skin needs to be as free of germs as possible. You can reduce the number of germs on your skin by washing with CHG (chlorahexidine gluconate) Soap before surgery.  CHG is an antiseptic cleaner which kills germs and bonds with the skin to continue killing germs  even after washing.  Please do not use if you have an allergy to CHG or antibacterial soaps. If your skin becomes reddened/irritated stop using the CHG.  Do not shave (including legs and underarms) for at least 48 hours prior to first CHG shower. It is OK to shave your face.  Please follow these instructions carefully.   1. Shower the NIGHT BEFORE SURGERY and the MORNING OF SURGERY with CHG.   2. If you chose to wash your hair, wash your hair first as usual with your normal shampoo.  3. After you shampoo, rinse your hair and body thoroughly to remove the shampoo.  4. Use CHG as you would any other liquid soap. You can apply CHG directly to the skin and wash gently with a scrungie or a clean washcloth.   5. Apply the CHG Soap to your body ONLY FROM THE NECK DOWN.  Do not use on open wounds or open sores. Avoid contact with your eyes, ears, mouth and genitals (private parts). Wash genitals (private parts) with your normal soap.  6. Wash thoroughly, paying special attention to the area where your surgery will be performed.  7. Thoroughly rinse your body with warm water from the neck down.  8. DO NOT shower/wash with your normal soap after using and rinsing off the CHG Soap.  9. Pat yourself dry with a CLEAN TOWEL.   10. Wear CLEAN PAJAMAS   11. Place CLEAN SHEETS on your bed the night of your first shower and  DO NOT SLEEP WITH PETS.    Day of Surgery: Do not apply any deodorants/lotions. Please wear clean clothes to the hospital/surgery center.      Please read over the following fact sheets that you were given.

## 2016-11-16 ENCOUNTER — Encounter (HOSPITAL_COMMUNITY)
Admission: RE | Admit: 2016-11-16 | Discharge: 2016-11-16 | Disposition: A | Discharge status: Home / Self Care | Payer: Medicare HMO | Source: Ambulatory Visit | Attending: Orthopaedic Surgery | Admitting: Orthopaedic Surgery

## 2016-11-16 ENCOUNTER — Encounter (HOSPITAL_COMMUNITY): Payer: Self-pay

## 2016-11-16 DIAGNOSIS — Z96652 Presence of left artificial knee joint: Secondary | ICD-10-CM | POA: Diagnosis not present

## 2016-11-16 DIAGNOSIS — D62 Acute posthemorrhagic anemia: Secondary | ICD-10-CM | POA: Diagnosis not present

## 2016-11-16 DIAGNOSIS — Z79899 Other long term (current) drug therapy: Secondary | ICD-10-CM | POA: Diagnosis not present

## 2016-11-16 DIAGNOSIS — Z8249 Family history of ischemic heart disease and other diseases of the circulatory system: Secondary | ICD-10-CM | POA: Diagnosis not present

## 2016-11-16 DIAGNOSIS — M1712 Unilateral primary osteoarthritis, left knee: Secondary | ICD-10-CM | POA: Diagnosis not present

## 2016-11-16 DIAGNOSIS — K219 Gastro-esophageal reflux disease without esophagitis: Secondary | ICD-10-CM | POA: Diagnosis not present

## 2016-11-16 DIAGNOSIS — G8918 Other acute postprocedural pain: Secondary | ICD-10-CM | POA: Diagnosis not present

## 2016-11-16 DIAGNOSIS — Z471 Aftercare following joint replacement surgery: Secondary | ICD-10-CM | POA: Diagnosis not present

## 2016-11-16 DIAGNOSIS — I10 Essential (primary) hypertension: Secondary | ICD-10-CM | POA: Diagnosis not present

## 2016-11-16 DIAGNOSIS — Z6841 Body Mass Index (BMI) 40.0 and over, adult: Secondary | ICD-10-CM | POA: Diagnosis not present

## 2016-11-16 DIAGNOSIS — E039 Hypothyroidism, unspecified: Secondary | ICD-10-CM | POA: Diagnosis not present

## 2016-11-16 HISTORY — DX: Personal history of urinary calculi: Z87.442

## 2016-11-16 LAB — COMPREHENSIVE METABOLIC PANEL
ALBUMIN: 3.8 g/dL (ref 3.5–5.0)
ALK PHOS: 99 U/L (ref 38–126)
ALT: 36 U/L (ref 14–54)
AST: 25 U/L (ref 15–41)
Anion gap: 9 (ref 5–15)
BILIRUBIN TOTAL: 0.4 mg/dL (ref 0.3–1.2)
BUN: 13 mg/dL (ref 6–20)
CO2: 25 mmol/L (ref 22–32)
CREATININE: 0.79 mg/dL (ref 0.44–1.00)
Calcium: 9.5 mg/dL (ref 8.9–10.3)
Chloride: 105 mmol/L (ref 101–111)
GFR calc Af Amer: >60 mL/min (ref >60)
GFR calc non Af Amer: >60 mL/min (ref >60)
Glucose, Bld: 122 mg/dL — ABNORMAL HIGH (ref 65–99)
Potassium: 3.9 mmol/L (ref 3.5–5.1)
SODIUM: 139 mmol/L (ref 135–145)
Total Protein: 7.8 g/dL (ref 6.5–8.1)

## 2016-11-16 LAB — TYPE AND SCREEN
ABO/RH(D): O POS
Antibody Screen: NEGATIVE

## 2016-11-16 LAB — CBC WITH DIFFERENTIAL/PLATELET
BASOS ABS: 0 10*3/uL (ref 0.0–0.1)
BASOS PCT: 0 %
EOS ABS: 0.2 10*3/uL (ref 0.0–0.7)
Eosinophils Relative: 3 %
HEMATOCRIT: 34 % — AB (ref 36.0–46.0)
HEMOGLOBIN: 11 g/dL — AB (ref 12.0–15.0)
Lymphocytes Relative: 39 %
Lymphs Abs: 2 10*3/uL (ref 0.7–4.0)
MCH: 29.2 pg (ref 26.0–34.0)
MCHC: 32.4 g/dL (ref 30.0–36.0)
MCV: 90.2 fL (ref 78.0–100.0)
Monocytes Absolute: 0.7 10*3/uL (ref 0.1–1.0)
Monocytes Relative: 13 %
NEUTROS ABS: 2.2 10*3/uL (ref 1.7–7.7)
NEUTROS PCT: 45 %
Platelets: 228 10*3/uL (ref 150–400)
RBC: 3.77 MIL/uL — AB (ref 3.87–5.11)
RDW: 13.5 % (ref 11.5–15.5)
WBC: 5 10*3/uL (ref 4.0–10.5)

## 2016-11-16 LAB — URINALYSIS, ROUTINE W REFLEX MICROSCOPIC
Bacteria, UA: NONE SEEN
Bilirubin Urine: NEGATIVE
GLUCOSE, UA: NEGATIVE mg/dL
HGB URINE DIPSTICK: NEGATIVE
KETONES UR: NEGATIVE mg/dL
Nitrite: NEGATIVE
PROTEIN: NEGATIVE mg/dL
Specific Gravity, Urine: 1.015 (ref 1.005–1.030)
pH: 5 (ref 5.0–8.0)

## 2016-11-16 LAB — SURGICAL PCR SCREEN
MRSA, PCR: NEGATIVE
Staphylococcus aureus: POSITIVE — AB

## 2016-11-16 LAB — PROTIME-INR
INR: 1.05
PROTHROMBIN TIME: 13.6 s (ref 11.4–15.2)

## 2016-11-16 LAB — SEDIMENTATION RATE: SED RATE: 65 mm/h — AB (ref 0–22)

## 2016-11-16 LAB — ABO/RH: ABO/RH(D): O POS

## 2016-11-16 LAB — C-REACTIVE PROTEIN: CRP: 0.8 mg/dL (ref <1.0)

## 2016-11-16 LAB — APTT: aPTT: 30 seconds (ref 24–36)

## 2016-11-16 MED ORDER — CEFAZOLIN SODIUM-DEXTROSE 2-4 GM/100ML-% IV SOLN
2.0000 g | INTRAVENOUS | Status: AC
  Administered 2016-11-17: 2 g via INTRAVENOUS
  Filled 2016-11-16: qty 100

## 2016-11-16 MED ORDER — BUPIVACAINE LIPOSOME 1.3 % IJ SUSP
20.0000 mL | INTRAMUSCULAR | Status: AC
  Administered 2016-11-17: 20 mL
  Filled 2016-11-16: qty 20

## 2016-11-16 MED ORDER — SODIUM CHLORIDE 0.9 % IV SOLN
1000.0000 mg | INTRAVENOUS | Status: AC
  Administered 2016-11-17: 1000 mg via INTRAVENOUS
  Filled 2016-11-16: qty 1100

## 2016-11-16 NOTE — Progress Notes (Signed)
PCP - [place] Cardiologist - denies  Chest x-ray - not needed  EKG - 11/16/16 Stress Test - denies ECHO - 2009 Cardiac Cath - denies    Patient denies shortness of breath, fever, cough and chest pain at PAT appointment   Patient verbalized understanding of instructions that were given to them at the PAT appointment. Patient was also instructed that they will need to review over the PAT instructions again at home before surgery.

## 2016-11-16 NOTE — Progress Notes (Signed)
Patient notified of positive result, treat day of surgery

## 2016-11-17 ENCOUNTER — Inpatient Hospital Stay (HOSPITAL_COMMUNITY): Payer: Medicare HMO | Admitting: Certified Registered Nurse Anesthetist

## 2016-11-17 ENCOUNTER — Encounter (HOSPITAL_COMMUNITY): Payer: Self-pay | Admitting: Orthopedic Surgery

## 2016-11-17 ENCOUNTER — Inpatient Hospital Stay (HOSPITAL_COMMUNITY): Payer: Medicare HMO

## 2016-11-17 ENCOUNTER — Encounter (HOSPITAL_COMMUNITY)
Admission: RE | Disposition: A | Discharge status: Home Health | Payer: Self-pay | Source: Ambulatory Visit | Attending: Orthopaedic Surgery

## 2016-11-17 ENCOUNTER — Inpatient Hospital Stay (HOSPITAL_COMMUNITY)
Admission: RE | Admit: 2016-11-17 | Discharge: 2016-11-21 | DRG: 470 | Disposition: A | Discharge status: Home Health | Payer: Medicare HMO | Source: Ambulatory Visit | Attending: Orthopaedic Surgery | Admitting: Orthopaedic Surgery

## 2016-11-17 DIAGNOSIS — Z79899 Other long term (current) drug therapy: Secondary | ICD-10-CM | POA: Diagnosis not present

## 2016-11-17 DIAGNOSIS — Z8249 Family history of ischemic heart disease and other diseases of the circulatory system: Secondary | ICD-10-CM

## 2016-11-17 DIAGNOSIS — K219 Gastro-esophageal reflux disease without esophagitis: Secondary | ICD-10-CM | POA: Diagnosis present

## 2016-11-17 DIAGNOSIS — I10 Essential (primary) hypertension: Secondary | ICD-10-CM | POA: Diagnosis present

## 2016-11-17 DIAGNOSIS — Z96651 Presence of right artificial knee joint: Secondary | ICD-10-CM

## 2016-11-17 DIAGNOSIS — E039 Hypothyroidism, unspecified: Secondary | ICD-10-CM | POA: Diagnosis not present

## 2016-11-17 DIAGNOSIS — Z96652 Presence of left artificial knee joint: Secondary | ICD-10-CM | POA: Diagnosis not present

## 2016-11-17 DIAGNOSIS — M1712 Unilateral primary osteoarthritis, left knee: Principal | ICD-10-CM | POA: Diagnosis present

## 2016-11-17 DIAGNOSIS — D62 Acute posthemorrhagic anemia: Secondary | ICD-10-CM | POA: Diagnosis not present

## 2016-11-17 DIAGNOSIS — G8918 Other acute postprocedural pain: Secondary | ICD-10-CM | POA: Diagnosis not present

## 2016-11-17 DIAGNOSIS — Z6841 Body Mass Index (BMI) 40.0 and over, adult: Secondary | ICD-10-CM | POA: Diagnosis not present

## 2016-11-17 DIAGNOSIS — Z471 Aftercare following joint replacement surgery: Secondary | ICD-10-CM | POA: Diagnosis not present

## 2016-11-17 DIAGNOSIS — Z96659 Presence of unspecified artificial knee joint: Secondary | ICD-10-CM

## 2016-11-17 HISTORY — PX: TOTAL KNEE ARTHROPLASTY: SHX125

## 2016-11-17 SURGERY — ARTHROPLASTY, KNEE, TOTAL
Anesthesia: Regional | Site: Knee | Laterality: Left

## 2016-11-17 MED ORDER — METOCLOPRAMIDE HCL 5 MG PO TABS: 5.0000 mg | ORAL_TABLET | Freq: Three times a day (TID) | ORAL | Status: DC | PRN

## 2016-11-17 MED ORDER — PHENYLEPHRINE HCL 10 MG/ML IJ SOLN
INTRAVENOUS | Status: DC | PRN
  Administered 2016-11-17: 25 ug/min via INTRAVENOUS

## 2016-11-17 MED ORDER — LACTATED RINGERS IV SOLN
INTRAVENOUS | Status: DC
Start: 2016-11-17 — End: 2016-11-21
  Administered 2016-11-17 (×2): via INTRAVENOUS

## 2016-11-17 MED ORDER — ONDANSETRON HCL 4 MG PO TABS: 4.0000 mg | ORAL_TABLET | Freq: Three times a day (TID) | ORAL | 0 refills | Status: DC | PRN

## 2016-11-17 MED ORDER — SORBITOL 70 % SOLN: 30.0000 mL | Freq: Every day | Status: DC | PRN

## 2016-11-17 MED ORDER — ROCURONIUM BROMIDE 10 MG/ML (PF) SYRINGE
PREFILLED_SYRINGE | INTRAVENOUS | Status: AC
  Filled 2016-11-17: qty 5

## 2016-11-17 MED ORDER — TRANEXAMIC ACID 1000 MG/10ML IV SOLN
INTRAVENOUS | Status: AC | PRN
  Administered 2016-11-17: 2000 mg via TOPICAL

## 2016-11-17 MED ORDER — TRANEXAMIC ACID 1000 MG/10ML IV SOLN
2000.0000 mg | Freq: Once | INTRAVENOUS | Status: DC
  Filled 2016-11-17: qty 20

## 2016-11-17 MED ORDER — HYDROCHLOROTHIAZIDE 12.5 MG PO CAPS: 12.5000 mg | ORAL_CAPSULE | Freq: Every day | ORAL | Status: DC

## 2016-11-17 MED ORDER — SENNOSIDES-DOCUSATE SODIUM 8.6-50 MG PO TABS: 1.0000 | ORAL_TABLET | Freq: Every evening | ORAL | 1 refills | Status: DC | PRN

## 2016-11-17 MED ORDER — PHENYLEPHRINE 40 MCG/ML (10ML) SYRINGE FOR IV PUSH (FOR BLOOD PRESSURE SUPPORT)
PREFILLED_SYRINGE | INTRAVENOUS | Status: AC
  Filled 2016-11-17: qty 10

## 2016-11-17 MED ORDER — SODIUM CHLORIDE 0.9 % IJ SOLN
INTRAMUSCULAR | Status: DC | PRN
  Administered 2016-11-17: 40 mL via INTRAVENOUS

## 2016-11-17 MED ORDER — PROPOFOL 10 MG/ML IV BOLUS
INTRAVENOUS | Status: DC | PRN
  Administered 2016-11-17: 20 mg via INTRAVENOUS

## 2016-11-17 MED ORDER — LISINOPRIL 10 MG PO TABS
10.0000 mg | ORAL_TABLET | Freq: Every day | ORAL | Status: DC
  Administered 2016-11-18 – 2016-11-21 (×4): 10 mg via ORAL
  Filled 2016-11-17 (×4): qty 1

## 2016-11-17 MED ORDER — TRAMADOL HCL 50 MG PO TABS
50.0000 mg | ORAL_TABLET | Freq: Three times a day (TID) | ORAL | Status: DC | PRN
  Administered 2016-11-17 – 2016-11-20 (×5): 100 mg via ORAL
  Filled 2016-11-17 (×5): qty 2

## 2016-11-17 MED ORDER — PROPOFOL 10 MG/ML IV BOLUS
INTRAVENOUS | Status: AC
  Filled 2016-11-17: qty 20

## 2016-11-17 MED ORDER — DEXAMETHASONE SODIUM PHOSPHATE 10 MG/ML IJ SOLN
10.0000 mg | Freq: Once | INTRAMUSCULAR | Status: AC
  Administered 2016-11-18: 10 mg via INTRAVENOUS
  Filled 2016-11-17: qty 1

## 2016-11-17 MED ORDER — ZOLPIDEM TARTRATE 5 MG PO TABS
5.0000 mg | ORAL_TABLET | Freq: Every evening | ORAL | Status: DC | PRN
  Administered 2016-11-17 – 2016-11-20 (×5): 5 mg via ORAL
  Filled 2016-11-17 (×4): qty 1

## 2016-11-17 MED ORDER — HYDROCHLOROTHIAZIDE 12.5 MG PO CAPS
12.5000 mg | ORAL_CAPSULE | Freq: Every day | ORAL | Status: DC
  Administered 2016-11-18 – 2016-11-21 (×4): 12.5 mg via ORAL
  Filled 2016-11-17 (×4): qty 1

## 2016-11-17 MED ORDER — PROPOFOL 500 MG/50ML IV EMUL
INTRAVENOUS | Status: DC | PRN
  Administered 2016-11-17: 75 ug/kg/min via INTRAVENOUS

## 2016-11-17 MED ORDER — EPHEDRINE 5 MG/ML INJ
INTRAVENOUS | Status: AC
  Filled 2016-11-17: qty 10

## 2016-11-17 MED ORDER — LIDOCAINE 2% (20 MG/ML) 5 ML SYRINGE
INTRAMUSCULAR | Status: AC
  Filled 2016-11-17: qty 5

## 2016-11-17 MED ORDER — PROMETHAZINE HCL 25 MG PO TABS: 25.0000 mg | ORAL_TABLET | Freq: Four times a day (QID) | ORAL | 1 refills | Status: DC | PRN

## 2016-11-17 MED ORDER — METHOCARBAMOL 1000 MG/10ML IJ SOLN
500.0000 mg | Freq: Four times a day (QID) | INTRAVENOUS | Status: DC | PRN
  Filled 2016-11-17: qty 5

## 2016-11-17 MED ORDER — FENTANYL CITRATE (PF) 100 MCG/2ML IJ SOLN
INTRAMUSCULAR | Status: AC
  Administered 2016-11-17: 50 ug via INTRAVENOUS
  Filled 2016-11-17: qty 2

## 2016-11-17 MED ORDER — FENTANYL CITRATE (PF) 250 MCG/5ML IJ SOLN
INTRAMUSCULAR | Status: AC
  Filled 2016-11-17: qty 5

## 2016-11-17 MED ORDER — PHENOL 1.4 % MT LIQD: 1.0000 | OROMUCOSAL | Status: DC | PRN

## 2016-11-17 MED ORDER — SUGAMMADEX SODIUM 200 MG/2ML IV SOLN
INTRAVENOUS | Status: AC
  Filled 2016-11-17: qty 2

## 2016-11-17 MED ORDER — ROPIVACAINE HCL 5 MG/ML IJ SOLN
INTRAMUSCULAR | Status: DC | PRN
  Administered 2016-11-17: 30 mL via PERINEURAL

## 2016-11-17 MED ORDER — METHOCARBAMOL 500 MG PO TABS
500.0000 mg | ORAL_TABLET | Freq: Four times a day (QID) | ORAL | Status: DC | PRN
  Administered 2016-11-17 – 2016-11-19 (×3): 500 mg via ORAL
  Filled 2016-11-17 (×3): qty 1

## 2016-11-17 MED ORDER — OXYCODONE HCL ER 10 MG PO T12A: 10.0000 mg | EXTENDED_RELEASE_TABLET | Freq: Two times a day (BID) | ORAL | 0 refills | Status: DC

## 2016-11-17 MED ORDER — MORPHINE SULFATE (PF) 4 MG/ML IV SOLN
1.0000 mg | INTRAVENOUS | Status: DC | PRN
  Administered 2016-11-17 – 2016-11-18 (×4): 1 mg via INTRAVENOUS
  Filled 2016-11-17 (×4): qty 1

## 2016-11-17 MED ORDER — ONDANSETRON HCL 4 MG PO TABS
4.0000 mg | ORAL_TABLET | Freq: Four times a day (QID) | ORAL | Status: DC | PRN
  Administered 2016-11-17: 4 mg via ORAL
  Filled 2016-11-17: qty 1

## 2016-11-17 MED ORDER — DEXTROSE 5 % IV SOLN
3.0000 g | Freq: Three times a day (TID) | INTRAVENOUS | Status: AC
  Administered 2016-11-17 – 2016-11-18 (×3): 3 g via INTRAVENOUS
  Filled 2016-11-17 (×4): qty 3000

## 2016-11-17 MED ORDER — POLYETHYLENE GLYCOL 3350 17 G PO PACK: 17.0000 g | PACK | Freq: Every day | ORAL | Status: DC | PRN

## 2016-11-17 MED ORDER — ALUM & MAG HYDROXIDE-SIMETH 200-200-20 MG/5ML PO SUSP
30.0000 mL | ORAL | Status: DC | PRN
  Administered 2016-11-20 (×3): 30 mL via ORAL
  Filled 2016-11-17 (×3): qty 30

## 2016-11-17 MED ORDER — DIPHENHYDRAMINE HCL 12.5 MG/5ML PO ELIX
25.0000 mg | ORAL_SOLUTION | ORAL | Status: DC | PRN
  Administered 2016-11-18 – 2016-11-19 (×6): 25 mg via ORAL
  Filled 2016-11-17 (×6): qty 10

## 2016-11-17 MED ORDER — SODIUM CHLORIDE 0.9 % IR SOLN
Status: DC | PRN
  Administered 2016-11-17: 3000 mL

## 2016-11-17 MED ORDER — DEXAMETHASONE SODIUM PHOSPHATE 10 MG/ML IJ SOLN
INTRAMUSCULAR | Status: AC
  Filled 2016-11-17: qty 1

## 2016-11-17 MED ORDER — ACETAMINOPHEN 650 MG RE SUPP: 650.0000 mg | Freq: Four times a day (QID) | RECTAL | Status: DC | PRN

## 2016-11-17 MED ORDER — MAGNESIUM CITRATE PO SOLN: 1.0000 | Freq: Once | ORAL | Status: DC | PRN

## 2016-11-17 MED ORDER — LISINOPRIL-HYDROCHLOROTHIAZIDE 10-12.5 MG PO TABS: 1.0000 | ORAL_TABLET | Freq: Every day | ORAL | Status: DC

## 2016-11-17 MED ORDER — ACETAMINOPHEN 500 MG PO TABS
1000.0000 mg | ORAL_TABLET | Freq: Four times a day (QID) | ORAL | Status: AC
  Administered 2016-11-17 – 2016-11-18 (×4): 1000 mg via ORAL
  Filled 2016-11-17 (×4): qty 2

## 2016-11-17 MED ORDER — CHLORHEXIDINE GLUCONATE 4 % EX LIQD: 60.0000 mL | Freq: Once | CUTANEOUS | Status: DC

## 2016-11-17 MED ORDER — FENTANYL CITRATE (PF) 100 MCG/2ML IJ SOLN
25.0000 ug | INTRAMUSCULAR | Status: DC | PRN
  Administered 2016-11-17 (×2): 50 ug via INTRAVENOUS

## 2016-11-17 MED ORDER — ONDANSETRON HCL 4 MG/2ML IJ SOLN: 4.0000 mg | Freq: Once | INTRAMUSCULAR | Status: DC | PRN

## 2016-11-17 MED ORDER — ONDANSETRON HCL 4 MG/2ML IJ SOLN: 4.0000 mg | Freq: Four times a day (QID) | INTRAMUSCULAR | Status: DC | PRN

## 2016-11-17 MED ORDER — PANTOPRAZOLE SODIUM 40 MG PO TBEC
40.0000 mg | DELAYED_RELEASE_TABLET | Freq: Every day | ORAL | Status: DC
  Administered 2016-11-18 – 2016-11-21 (×4): 40 mg via ORAL
  Filled 2016-11-17 (×4): qty 1

## 2016-11-17 MED ORDER — MIDAZOLAM HCL 2 MG/2ML IJ SOLN
2.0000 mg | Freq: Once | INTRAMUSCULAR | Status: AC
  Administered 2016-11-17: 2 mg via INTRAVENOUS

## 2016-11-17 MED ORDER — BUPIVACAINE IN DEXTROSE 0.75-8.25 % IT SOLN
INTRATHECAL | Status: DC | PRN
  Administered 2016-11-17: 1.6 mL via INTRATHECAL

## 2016-11-17 MED ORDER — TRANEXAMIC ACID 1000 MG/10ML IV SOLN
1000.0000 mg | Freq: Once | INTRAVENOUS | Status: AC
  Administered 2016-11-17: 1000 mg via INTRAVENOUS
  Filled 2016-11-17: qty 10

## 2016-11-17 MED ORDER — LISINOPRIL 10 MG PO TABS: 10.0000 mg | ORAL_TABLET | Freq: Every day | ORAL | Status: DC

## 2016-11-17 MED ORDER — MIDAZOLAM HCL 2 MG/2ML IJ SOLN
INTRAMUSCULAR | Status: AC
  Filled 2016-11-17: qty 2

## 2016-11-17 MED ORDER — SIMVASTATIN 20 MG PO TABS
20.0000 mg | ORAL_TABLET | Freq: Every day | ORAL | Status: DC
  Administered 2016-11-17 – 2016-11-20 (×4): 20 mg via ORAL
  Filled 2016-11-17 (×4): qty 1

## 2016-11-17 MED ORDER — LEVOTHYROXINE SODIUM 75 MCG PO TABS
150.0000 ug | ORAL_TABLET | Freq: Every day | ORAL | Status: DC
  Administered 2016-11-19 – 2016-11-21 (×3): 150 ug via ORAL
  Filled 2016-11-17 (×4): qty 2

## 2016-11-17 MED ORDER — ZOLPIDEM TARTRATE 5 MG PO TABS
5.0000 mg | ORAL_TABLET | Freq: Every evening | ORAL | Status: DC | PRN
  Administered 2016-11-17: 5 mg via ORAL
  Filled 2016-11-17 (×2): qty 1

## 2016-11-17 MED ORDER — ONDANSETRON HCL 4 MG/2ML IJ SOLN
INTRAMUSCULAR | Status: AC
  Filled 2016-11-17: qty 2

## 2016-11-17 MED ORDER — FENTANYL CITRATE (PF) 100 MCG/2ML IJ SOLN
INTRAMUSCULAR | Status: AC
  Filled 2016-11-17: qty 2

## 2016-11-17 MED ORDER — METHOCARBAMOL 750 MG PO TABS: 750.0000 mg | ORAL_TABLET | Freq: Two times a day (BID) | ORAL | 0 refills | Status: DC | PRN

## 2016-11-17 MED ORDER — ACETAMINOPHEN 325 MG PO TABS
650.0000 mg | ORAL_TABLET | Freq: Four times a day (QID) | ORAL | Status: DC | PRN
  Administered 2016-11-20: 650 mg via ORAL
  Filled 2016-11-17: qty 2

## 2016-11-17 MED ORDER — 0.9 % SODIUM CHLORIDE (POUR BTL) OPTIME
TOPICAL | Status: DC | PRN
  Administered 2016-11-17: 1000 mL

## 2016-11-17 MED ORDER — MENTHOL 3 MG MT LOZG: 1.0000 | LOZENGE | OROMUCOSAL | Status: DC | PRN

## 2016-11-17 MED ORDER — SODIUM CHLORIDE 0.9 % IV SOLN
INTRAVENOUS | Status: DC
  Administered 2016-11-18: 02:00:00 via INTRAVENOUS

## 2016-11-17 MED ORDER — OXYCODONE HCL ER 15 MG PO T12A
15.0000 mg | EXTENDED_RELEASE_TABLET | Freq: Two times a day (BID) | ORAL | Status: DC
  Administered 2016-11-17 – 2016-11-21 (×5): 15 mg via ORAL
  Filled 2016-11-17 (×5): qty 1

## 2016-11-17 MED ORDER — METOCLOPRAMIDE HCL 5 MG/ML IJ SOLN: 5.0000 mg | Freq: Three times a day (TID) | INTRAMUSCULAR | Status: DC | PRN

## 2016-11-17 MED ORDER — EPHEDRINE SULFATE 50 MG/ML IJ SOLN
INTRAMUSCULAR | Status: DC | PRN
  Administered 2016-11-17: 5 mg via INTRAVENOUS

## 2016-11-17 MED ORDER — KETOROLAC TROMETHAMINE 15 MG/ML IJ SOLN
30.0000 mg | Freq: Four times a day (QID) | INTRAMUSCULAR | Status: AC
  Administered 2016-11-17 – 2016-11-18 (×4): 30 mg via INTRAVENOUS
  Filled 2016-11-17 (×4): qty 2

## 2016-11-17 MED ORDER — CYCLOBENZAPRINE HCL 10 MG PO TABS: 10.0000 mg | ORAL_TABLET | Freq: Every evening | ORAL | Status: DC | PRN

## 2016-11-17 MED ORDER — ASPIRIN EC 325 MG PO TBEC: 325.0000 mg | DELAYED_RELEASE_TABLET | Freq: Two times a day (BID) | ORAL | 0 refills | Status: DC

## 2016-11-17 MED ORDER — FENTANYL CITRATE (PF) 100 MCG/2ML IJ SOLN
INTRAMUSCULAR | Status: DC | PRN
  Administered 2016-11-17 (×2): 50 ug via INTRAVENOUS

## 2016-11-17 MED ORDER — OXYCODONE HCL 5 MG PO TABS
5.0000 mg | ORAL_TABLET | ORAL | Status: DC | PRN
  Administered 2016-11-17 – 2016-11-18 (×2): 10 mg via ORAL
  Administered 2016-11-18 – 2016-11-21 (×5): 15 mg via ORAL
  Filled 2016-11-17: qty 2
  Filled 2016-11-17 (×4): qty 3
  Filled 2016-11-17: qty 2
  Filled 2016-11-17: qty 3

## 2016-11-17 MED ORDER — MIDAZOLAM HCL 2 MG/2ML IJ SOLN
INTRAMUSCULAR | Status: AC
  Administered 2016-11-17: 2 mg via INTRAVENOUS
  Filled 2016-11-17: qty 2

## 2016-11-17 MED ORDER — OXYCODONE HCL 5 MG PO TABS: 5.0000 mg | ORAL_TABLET | ORAL | 0 refills | Status: DC | PRN

## 2016-11-17 MED ORDER — SUCCINYLCHOLINE CHLORIDE 200 MG/10ML IV SOSY
PREFILLED_SYRINGE | INTRAVENOUS | Status: AC
  Filled 2016-11-17: qty 10

## 2016-11-17 MED ORDER — ASPIRIN EC 325 MG PO TBEC
325.0000 mg | DELAYED_RELEASE_TABLET | Freq: Two times a day (BID) | ORAL | Status: DC
  Administered 2016-11-17 – 2016-11-21 (×8): 325 mg via ORAL
  Filled 2016-11-17 (×8): qty 1

## 2016-11-17 SURGICAL SUPPLY — 70 items
ALCOHOL ISOPROPYL (RUBBING) (MISCELLANEOUS) ×3 IMPLANT
APL SKNCLS STERI-STRIP NONHPOA (GAUZE/BANDAGES/DRESSINGS) ×1
BAG DECANTER FOR FLEXI CONT (MISCELLANEOUS) ×3 IMPLANT
BANDAGE ACE 6X5 VEL STRL LF (GAUZE/BANDAGES/DRESSINGS) ×6 IMPLANT
BANDAGE ESMARK 6X9 LF (GAUZE/BANDAGES/DRESSINGS) ×1 IMPLANT
BENZOIN TINCTURE PRP APPL 2/3 (GAUZE/BANDAGES/DRESSINGS) ×3 IMPLANT
BLADE SAW SGTL 13.0X1.19X90.0M (BLADE) ×3 IMPLANT
BNDG CMPR 9X6 STRL LF SNTH (GAUZE/BANDAGES/DRESSINGS) ×1
BNDG ESMARK 6X9 LF (GAUZE/BANDAGES/DRESSINGS) ×3
BOWL SMART MIX CTS (DISPOSABLE) ×3 IMPLANT
CAPT KNEE TOTAL 3 ×2 IMPLANT
CEMENT BONE REFOBACIN R1X40 US (Cement) ×4 IMPLANT
CLOSURE STERI-STRIP 1/2X4 (GAUZE/BANDAGES/DRESSINGS) ×2
CLSR STERI-STRIP ANTIMIC 1/2X4 (GAUZE/BANDAGES/DRESSINGS) ×4 IMPLANT
COVER SURGICAL LIGHT HANDLE (MISCELLANEOUS) ×3 IMPLANT
CUFF TOURNIQUET SINGLE 34IN LL (TOURNIQUET CUFF) ×3 IMPLANT
DRAPE EXTREMITY T 121X128X90 (DRAPE) ×3 IMPLANT
DRAPE HALF SHEET 40X57 (DRAPES) ×3 IMPLANT
DRAPE INCISE IOBAN 66X45 STRL (DRAPES) IMPLANT
DRAPE ORTHO SPLIT 77X108 STRL (DRAPES) ×6
DRAPE SURG 17X11 SM STRL (DRAPES) ×6 IMPLANT
DRAPE SURG ORHT 6 SPLT 77X108 (DRAPES) ×2 IMPLANT
DRSG AQUACEL AG ADV 3.5X14 (GAUZE/BANDAGES/DRESSINGS) ×3 IMPLANT
DURAPREP 26ML APPLICATOR (WOUND CARE) ×8 IMPLANT
ELECT CAUTERY BLADE 6.4 (BLADE) ×3 IMPLANT
ELECT REM PT RETURN 9FT ADLT (ELECTROSURGICAL) ×3
ELECTRODE REM PT RTRN 9FT ADLT (ELECTROSURGICAL) ×1 IMPLANT
GLOVE BIOGEL PI IND STRL 7.0 (GLOVE) IMPLANT
GLOVE BIOGEL PI INDICATOR 7.0 (GLOVE) ×2
GLOVE SKINSENSE NS SZ7.5 (GLOVE) ×2
GLOVE SKINSENSE STRL SZ7.5 (GLOVE) ×1 IMPLANT
GLOVE SURG SS PI 6.5 STRL IVOR (GLOVE) ×2 IMPLANT
GLOVE SURG SS PI 8.0 STRL IVOR (GLOVE) ×4 IMPLANT
GLOVE SURG SYN 7.5  E (GLOVE) ×8
GLOVE SURG SYN 7.5 E (GLOVE) ×4 IMPLANT
GLOVE SURG SYN 7.5 PF PI (GLOVE) ×4 IMPLANT
GOWN STRL REIN XL XLG (GOWN DISPOSABLE) ×3 IMPLANT
GOWN STRL REUS W/ TWL LRG LVL3 (GOWN DISPOSABLE) ×1 IMPLANT
GOWN STRL REUS W/TWL 2XL LVL3 (GOWN DISPOSABLE) ×2 IMPLANT
GOWN STRL REUS W/TWL LRG LVL3 (GOWN DISPOSABLE) ×3
HANDPIECE INTERPULSE COAX TIP (DISPOSABLE) ×3
HOOD PEEL AWAY FLYTE STAYCOOL (MISCELLANEOUS) ×8 IMPLANT
KIT BASIN OR (CUSTOM PROCEDURE TRAY) ×3 IMPLANT
KIT ROOM TURNOVER OR (KITS) ×3 IMPLANT
MANIFOLD NEPTUNE II (INSTRUMENTS) ×3 IMPLANT
MARKER SKIN DUAL TIP RULER LAB (MISCELLANEOUS) ×5 IMPLANT
NDL SPNL 18GX3.5 QUINCKE PK (NEEDLE) ×1 IMPLANT
NEEDLE SPNL 18GX3.5 QUINCKE PK (NEEDLE) ×3 IMPLANT
NS IRRIG 1000ML POUR BTL (IV SOLUTION) ×3 IMPLANT
PACK TOTAL JOINT (CUSTOM PROCEDURE TRAY) ×3 IMPLANT
PAD ARMBOARD 7.5X6 YLW CONV (MISCELLANEOUS) ×6 IMPLANT
SAW OSC TIP CART 19.5X105X1.3 (SAW) ×3 IMPLANT
SET HNDPC FAN SPRY TIP SCT (DISPOSABLE) ×1 IMPLANT
SOAP 2 % CHG 4 OZ (WOUND CARE) ×2 IMPLANT
STAPLER VISISTAT 35W (STAPLE) IMPLANT
SUCTION FRAZIER HANDLE 10FR (MISCELLANEOUS) ×2
SUCTION TUBE FRAZIER 10FR DISP (MISCELLANEOUS) ×1 IMPLANT
SUT ETHILON 2 0 FS 18 (SUTURE) IMPLANT
SUT MNCRL AB 4-0 PS2 18 (SUTURE) IMPLANT
SUT VIC AB 0 CT1 27 (SUTURE) ×6
SUT VIC AB 0 CT1 27XBRD ANBCTR (SUTURE) ×2 IMPLANT
SUT VIC AB 1 CTX 27 (SUTURE) ×9 IMPLANT
SUT VIC AB 2-0 CT1 27 (SUTURE) ×9
SUT VIC AB 2-0 CT1 TAPERPNT 27 (SUTURE) ×3 IMPLANT
SYR 50ML LL SCALE MARK (SYRINGE) ×3 IMPLANT
TOWEL OR 17X24 6PK STRL BLUE (TOWEL DISPOSABLE) ×3 IMPLANT
TOWEL OR 17X26 10 PK STRL BLUE (TOWEL DISPOSABLE) ×3 IMPLANT
TRAY CATH 16FR W/PLASTIC CATH (SET/KITS/TRAYS/PACK) IMPLANT
UNDERPAD 30X30 (UNDERPADS AND DIAPERS) ×3 IMPLANT
WRAP KNEE MAXI GEL POST OP (GAUZE/BANDAGES/DRESSINGS) ×3 IMPLANT

## 2016-11-17 NOTE — Progress Notes (Signed)
Pt continues to c/o of severe pain/discomfort despite prn Oxycodone, Robaxin, Morphine, and scheduled Tylenol, Toradol. Pt's daughter stated that pt takes Tramadol at home with relief. Dr. Erlinda Hong called with update, and orders received for Tramadol 50-100 mg q8 PRN. Pt and family updated and verbalized understanding. Will continue to monitor

## 2016-11-17 NOTE — Anesthesia Postprocedure Evaluation (Signed)
Anesthesia Post Note  Patient: Kelly Perkins  Procedure(s) Performed: Procedure(s) (LRB): LEFT TOTAL KNEE ARTHROPLASTY (Left)     Patient location during evaluation: PACU Anesthesia Type: Regional and Spinal Level of consciousness: oriented and awake and alert Pain management: pain level controlled Vital Signs Assessment: post-procedure vital signs reviewed and stable Respiratory status: spontaneous breathing, respiratory function stable and patient connected to nasal cannula oxygen Cardiovascular status: blood pressure returned to baseline and stable Postop Assessment: no headache and no backache Anesthetic complications: no    Last Vitals:  Vitals:   11/17/16 1630 11/17/16 1636  BP: (!) 131/94 (!) 150/77  Pulse: 79 81  Resp: 15 16  Temp:  36.5 C  SpO2: 95% 97%    Last Pain:  Vitals:   11/17/16 1636  TempSrc:   PainSc: Asleep                 Smaran Gaus P Paislee Szatkowski

## 2016-11-17 NOTE — Progress Notes (Signed)
Orthopedic Tech Progress Note Patient Details:  Kelly Perkins [birth year] [id number]  CPM Left Knee CPM Left Knee: On Left Knee Flexion (Degrees): 90 Left Knee Extension (Degrees): 0 Additional Comments: applied cpm to pt left knee/leg.  pt tolerated application very well.  left knee/leg.   Kristopher Oppenheim 11/17/2016, 4:37 PM

## 2016-11-17 NOTE — Anesthesia Procedure Notes (Signed)
Anesthesia Regional Block: Adductor canal block   Pre-Anesthetic Checklist: ,, timeout performed, Correct Patient, Correct Site, Correct Laterality, Correct Procedure,, site marked, risks and benefits discussed, Surgical consent,  Pre-op evaluation,  At surgeon's request and post-op pain management  Laterality: Left  Prep: chloraprep       Needles:  Injection technique: Single-shot  Needle Type: Echogenic Stimulator Needle     Needle Length: 9cm  Needle Gauge: 21     Additional Needles:   Procedures: ultrasound guided,,,,,,,,  Narrative:  Start time: 11/17/2016 12:05 PM End time: 11/17/2016 12:15 PM Injection made incrementally with aspirations every 5 mL.  Performed by: Personally  Anesthesiologist: Adele Barthel P  Additional Notes: Functioning IV was confirmed and monitors were applied.  A 53mm 21ga Arrow echogenic stimulator needle was used. Sterile prep, hand hygiene and sterile gloves were used.  Negative aspiration and negative test dose prior to incremental administration of local anesthetic. The patient tolerated the procedure well.

## 2016-11-17 NOTE — H&P (Addendum)
PREOPERATIVE H&P  Chief Complaint: left knee degenerative joint disease  HPI: Kelly Perkins is a 66 y.o. female who presents for surgical treatment of left knee degenerative joint disease.  She denies any changes in medical history.  Past Medical History:  Diagnosis Date  . GERD (gastroesophageal reflux disease)   . History of kidney stones   . History of lithotripsy    urolthiasis 2004  . Hyperlipidemia 09/10/2010  . Hypertension    no meds  . Hypothyroidism   . Kidney stones   . Normal cardiac stress test 6-12   had CP-DOE  . Osteoarthritis   . Wears dentures    top  . Wears glasses    Past Surgical History:  Procedure Laterality Date  . COLONOSCOPY     [id number]  . hysterectomy (unknown)  2000   B oophoreectomy, d/t DUB  . MASS EXCISION  09/12/2011   Procedure: EXCISION MASS;  Surgeon: Cristine Polio, MD;  Location: [facility];  Service: Plastics;  Laterality: Left;  left ring finger  . POLYPECTOMY     TA 2004  . THYROIDECTOMY     Social History   Social History  . Marital status: Single    Spouse name: N/A  . Number of children: 2  . Years of education: N/A   Occupational History  . 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 Smoking status: Never Smoker  . Smokeless tobacco: Never Used  . Alcohol use 0.0 oz/week     Comment: socially  . Drug use: No  . Sexual activity: Not Asked   Other Topics Concern  . None   Social History Narrative   Single, mom is one of my patients [name].        Family History  Problem Relation Age of Onset  . Hypertension Mother   . Coronary artery disease Mother        onset?  Marland Kitchen Hypertension Sister   . Colon polyps Other        niece  . Diabetes Neg Hx   . Colon cancer Neg Hx   . Breast cancer Neg Hx   . Stroke Neg Hx   . Rectal cancer Neg Hx   . Stomach cancer Neg Hx    No Known Allergies Prior to Admission medications   Medication Sig Start Date End Date  Taking? Authorizing Provider  cyclobenzaprine (FLEXERIL) 10 MG tablet Take 1 tablet (10 mg total) by mouth at bedtime as needed for muscle spasms. 08/01/16  Yes Paz, Alda Berthold, MD  levothyroxine (SYNTHROID, LEVOTHROID) 150 MCG tablet Take 1 tablet (150 mcg total) by mouth daily before breakfast. 08/22/16  Yes Paz, Alda Berthold, MD  lisinopril-hydrochlorothiazide (PRINZIDE,ZESTORETIC) 10-12.5 MG tablet Take 1 tablet by mouth daily. 02/22/16  Yes Paz, Alda Berthold, MD  meloxicam (MOBIC) 15 MG tablet TAKE 1 TABLET BY MOUTH ONCE DAILY AS NEEDED. 05/30/16  Yes Pete Pelt, PA-C  pantoprazole (PROTONIX) 40 MG tablet Take 1 tablet (40 mg total) by mouth daily. 08/31/16  Yes Paz, Alda Berthold, MD  simvastatin (ZOCOR) 20 MG tablet Take 1 tablet (20 mg total) by mouth at bedtime. 11/11/16  Yes Paz, Alda Berthold, MD  traMADol (ULTRAM) 50 MG tablet Take 1 tablet (50 mg total) by mouth every 12 (twelve) hours as needed. 09/07/16  Yes Leandrew Koyanagi, MD  zolpidem (AMBIEN) 10 MG tablet Take 1 tablet (10 mg total) by mouth at bedtime.  08/25/16  Yes Colon Branch, MD  aspirin EC 325 MG tablet Take 1 tablet (325 mg total) by mouth 2 (two) times daily. 11/17/16   Leandrew Koyanagi, MD  methocarbamol (ROBAXIN) 750 MG tablet Take 1 tablet (750 mg total) by mouth 2 (two) times daily as needed for muscle spasms. 11/17/16   Leandrew Koyanagi, MD  ondansetron (ZOFRAN) 4 MG tablet Take 1-2 tablets (4-8 mg total) by mouth every 8 (eight) hours as needed for nausea or vomiting. 11/17/16   Leandrew Koyanagi, MD  oxyCODONE (OXY IR/ROXICODONE) 5 MG immediate release tablet Take 1-3 tablets (5-15 mg total) by mouth every 4 (four) hours as needed. 11/17/16   Leandrew Koyanagi, MD  oxyCODONE (OXYCONTIN) 10 mg 12 hr tablet Take 1 tablet (10 mg total) by mouth every 12 (twelve) hours. 11/17/16   Leandrew Koyanagi, MD  promethazine (PHENERGAN) 25 MG tablet Take 1 tablet (25 mg total) by mouth every 6 (six) hours as needed for nausea. 11/17/16   Leandrew Koyanagi, MD  senna-docusate (SENOKOT S) 8.6-50 MG  tablet Take 1 tablet by mouth at bedtime as needed. 11/17/16   Leandrew Koyanagi, MD     Positive ROS: All other systems have been reviewed and were otherwise negative with the exception of those mentioned in the HPI and as above.  Physical Exam: General: Alert, no acute distress Cardiovascular: No pedal edema Respiratory: No cyanosis, no use of accessory musculature GI: abdomen soft Skin: No lesions in the area of chief complaint Neurologic: Sensation intact distally Psychiatric: Patient is competent for consent with normal mood and affect Lymphatic: no lymphedema  MUSCULOSKELETAL: exam stable  Assessment: left knee degenerative joint disease  Plan: Plan for Procedure(s): LEFT TOTAL KNEE ARTHROPLASTY  The risks benefits and alternatives were discussed with the patient including but not limited to the risks of nonoperative treatment, versus surgical intervention including infection, bleeding, nerve injury,  blood clots, cardiopulmonary complications, morbidity, mortality, among others, and they were willing to proceed.   Eduard Roux, MD   11/17/2016 2:33 PM

## 2016-11-17 NOTE — Anesthesia Procedure Notes (Signed)
Spinal  Patient location during procedure: OR Start time: 11/17/2016 12:30 PM End time: 11/17/2016 12:40 PM Staffing Anesthesiologist: Adele Barthel P Performed: anesthesiologist  Preanesthetic Checklist Completed: patient identified, surgical consent, pre-op evaluation, timeout performed, IV checked, risks and benefits discussed and monitors and equipment checked Spinal Block Patient position: sitting Prep: DuraPrep Patient monitoring: cardiac monitor, continuous pulse ox and blood pressure Approach: left paramedian Location: L4-5 Injection technique: single-shot Needle Needle type: Pencan  Needle gauge: 24 G Needle length: 9 cm Assessment Sensory level: T10 Additional Notes Functioning IV was confirmed and monitors were applied. Sterile prep and drape, including hand hygiene and sterile gloves were used. The patient was positioned and the spine was prepped. The skin was anesthetized with lidocaine.  Free flow of clear CSF was obtained prior to injecting local anesthetic into the CSF.  The spinal needle aspirated freely following injection.  The needle was carefully withdrawn.  The patient tolerated the procedure well.

## 2016-11-17 NOTE — Op Note (Signed)
Total Knee Arthroplasty Procedure Note  Preoperative diagnosis: Left knee osteoarthritis  Postoperative diagnosis:same  Operative procedure: Left total knee arthroplasty. CPT [phone]  Surgeon: N. Eduard Roux, MD  Assistants: Ky Barban, RNFA  Anesthesia: Spinal, regional  Tourniquet time: 66 mins  Implants used: [employer] Femur: 5 narrow PS Tibia: 4 Patella: 32 mm, 7.5 Polyethylene: 13 mm  Indication: Kelly Perkins is a 66 y.o. year old female with a history of knee pain. Having failed conservative management, the patient elected to proceed with a total knee arthroplasty.  We have reviewed the risk and benefits of the surgery and they elected to proceed after voicing understanding.  Procedure:  After informed consent was obtained and understanding of the risk were voiced including but not limited to bleeding, infection, damage to surrounding structures including nerves and vessels, blood clots, leg length inequality and the failure to achieve desired results, the operative extremity was marked with verbal confirmation of the patient in the holding area.   The patient was then brought to the operating room and transported to the operating room table in the supine position.  A tourniquet was applied to the operative extremity around the upper thigh. The operative limb was then prepped and draped in the usual sterile fashion and preoperative antibiotics were administered.  A time out was performed prior to the start of surgery confirming the correct extremity, preoperative antibiotic administration, as well as team members, implants and instruments available for the case. Correct surgical site was also confirmed with preoperative radiographs. The limb was then elevated for exsanguination and the tourniquet was inflated. A midline incision was made and a standard medial parapatellar approach was performed.  The patella was prepared and sized to a 32 mm.  A cover was placed  on the patella for protection from retractors.  We then turned our attention to the femur. Posterior cruciate ligament was sacrificed. Start site was drilled in the femur and the intramedullary distal femoral cutting guide was placed, set at 5 degrees valgus, taking 9 mm of distal resection. The distal cut was made. Osteophytes were then removed. Next, the proximal tibial cutting guide was placed with appropriate slope, varus/valgus alignment and depth of resection. The proximal tibial cut was made. Gap blocks were then used to assess the extension gap and alignment, and appropriate soft tissue releases were performed. Attention was turned back to the femur, which was sized using the sizing guide to a size 5. Appropriate rotation of the femoral component was determined using epicondylar axis, Whiteside's line, and assessing the flexion gap under ligament tension. The appropriate size 4-in-1 cutting block was placed and cuts were made. Posterior femoral osteophytes and uncapped bone were then removed with the curved osteotome. The tibia was sized for a size 4 component. The femoral box-cutting guide was placed and prepared for a PS femoral component. Trial components were placed, and stability was checked in full extension, mid-flexion, and deep flexion. Proper tibial rotation was determined and marked.  The patella tracked well without a lateral release. Trial components were then removed and tibial preparation performed. A posterior capsular injection comprising of 20 cc of 1.3% exparel and 40 cc of normal saline was performed for postoperative pain control. The bony surfaces were irrigated with a pulse lavage and then dried. Bone cement was vacuum mixed on the back table, and the final components sized above were cemented into place. After cement had finished curing, excess cement was removed. The stability of the construct  was re-evaluated throughout a range of motion and found to be acceptable. The trial  liner was removed, the knee was copiously irrigated, and the knee was re-evaluated for any excess bone debris. The real polyethylene liner, 13 mm thick, was inserted and checked to ensure the locking mechanism had engaged appropriately. The tourniquet was deflated and hemostasis was achieved. The wound was irrigated with dilute betadine in normal saline, and then again with normal saline. A drain was not placed. Capsular closure was performed with a #1 vicryl, subcutaneous fat closed with a 0 vicryl suture, then subcutaneous tissue closed with interrupted 2.0 vicryl suture. The skin was then closed with a 3.0 monocryl. A sterile dressing was applied.   The patient was awakened in the operating room and taken to recovery in stable condition. All sponge, needle, and instrument counts were correct at the end of the case.  Position: supine  Complications: none.  Time Out: performed   Drains/Packing: none  Estimated blood loss: minimal  Returned to Recovery Room: in good condition.   Antibiotics: yes   Mechanical VTE (DVT) Prophylaxis: sequential compression devices, TED thigh-high  Chemical VTE (DVT) Prophylaxis: aspirin  Fluid Replacement  Crystalloid: see anesthesia record Blood: none  FFP: none   Specimens Removed: 1 to pathology   Sponge and Instrument Count Correct? yes   PACU: portable radiograph - knee AP and Lateral   Admission: inpatient status  Plan/RTC: Return in 2 weeks for wound check.   Weight Bearing/Load Lower Extremity: full   N. Eduard Roux, MD [facility] 2:24 PM

## 2016-11-17 NOTE — Transfer of Care (Signed)
Immediate Anesthesia Transfer of Care Note  Patient: Kelly Perkins  Procedure(s) Performed: Procedure(s): LEFT TOTAL KNEE ARTHROPLASTY (Left)  Patient Location: PACU  Anesthesia Type:MAC, Regional and Spinal  Level of Consciousness: awake, alert , oriented and patient cooperative  Airway & Oxygen Therapy: Patient Spontanous Breathing and Patient connected to nasal cannula oxygen  Post-op Assessment: Report given to RN and Post -op Vital signs reviewed and stable  Post vital signs: Reviewed and stable  Last Vitals:  Vitals:   11/17/16 1220 11/17/16 1513  BP: 124/76 123/83  Pulse: 80 79  Resp: 19 18  Temp:  36.5 C  SpO2: 100% 100%    Last Pain:  Vitals:   11/17/16 1513  TempSrc:   PainSc: 0-No pain         Complications: No apparent anesthesia complications

## 2016-11-17 NOTE — Discharge Instructions (Signed)

## 2016-11-17 NOTE — Anesthesia Preprocedure Evaluation (Addendum)
Anesthesia Evaluation  Patient identified by MRN, date of birth, ID band Patient awake    Reviewed: Allergy & Precautions, H&P , NPO status , Patient's Chart, lab work & pertinent test results, reviewed documented beta blocker date and time   Airway Mallampati: II  TM Distance: >3 FB Neck ROM: full    Dental  (+) Upper Dentures, Lower Dentures   Pulmonary neg pulmonary ROS,    Pulmonary exam normal        Cardiovascular hypertension, On Medications Normal cardiovascular exam  ECG: NSR, rate 95   Neuro/Psych negative neurological ROS  negative psych ROS   GI/Hepatic Neg liver ROS, GERD  Medicated and Controlled,  Endo/Other  Hypothyroidism Morbid obesity  Renal/GU negative Renal ROS     Musculoskeletal   Abdominal (+) + obese,   Peds  Hematology  (+) anemia ,   Anesthesia Other Findings   Reproductive/Obstetrics                            Anesthesia Physical  Anesthesia Plan  ASA: III  Anesthesia Plan: Spinal and Regional   Post-op Pain Management:  Regional for Post-op pain   Induction: Intravenous  PONV Risk Score and Plan: 2 and Ondansetron, Dexamethasone and Propofol infusion  Airway Management Planned: Natural Airway  Additional Equipment:   Intra-op Plan:   Post-operative Plan:   Informed Consent: I have reviewed the patients History and Physical, chart, labs and discussed the procedure including the risks, benefits and alternatives for the proposed anesthesia with the patient or authorized representative who has indicated his/her understanding and acceptance.   Dental Advisory Given  Plan Discussed with: CRNA  Anesthesia Plan Comments:         Anesthesia Quick Evaluation

## 2016-11-18 ENCOUNTER — Encounter (HOSPITAL_COMMUNITY): Payer: Self-pay | Admitting: Orthopaedic Surgery

## 2016-11-18 LAB — BASIC METABOLIC PANEL
ANION GAP: 8 (ref 5–15)
BUN: 12 mg/dL (ref 6–20)
CALCIUM: 8.5 mg/dL — AB (ref 8.9–10.3)
CO2: 25 mmol/L (ref 22–32)
Chloride: 102 mmol/L (ref 101–111)
Creatinine, Ser: 0.75 mg/dL (ref 0.44–1.00)
GLUCOSE: 118 mg/dL — AB (ref 65–99)
POTASSIUM: 4 mmol/L (ref 3.5–5.1)
SODIUM: 135 mmol/L (ref 135–145)

## 2016-11-18 LAB — CBC
HCT: 32.2 % — ABNORMAL LOW (ref 36.0–46.0)
Hemoglobin: 10.4 g/dL — ABNORMAL LOW (ref 12.0–15.0)
MCH: 29.2 pg (ref 26.0–34.0)
MCHC: 32.3 g/dL (ref 30.0–36.0)
MCV: 90.4 fL (ref 78.0–100.0)
Platelets: 173 10*3/uL (ref 150–400)
RBC: 3.56 MIL/uL — ABNORMAL LOW (ref 3.87–5.11)
RDW: 13.4 % (ref 11.5–15.5)
WBC: 6.8 10*3/uL (ref 4.0–10.5)

## 2016-11-18 NOTE — Progress Notes (Signed)
   Subjective:  Patient reports pain as improved this morning.   Objective:   VITALS:   Vitals:   11/17/16 1630 11/17/16 1636 11/17/16 2031 11/18/16 0556  BP: (!) 131/94 (!) 150/77 (!) 135/58 (!) 99/58  Pulse: 79 81 100 90  Resp: 15 16 18 18   Temp:  97.7 F (36.5 C) 98 F (36.7 C) 98.8 F (37.1 C)  TempSrc:   Oral Oral  SpO2: 95% 97% 95% 94%  Weight:      Height:        Neurologically intact Neurovascular intact Sensation intact distally Intact pulses distally Dorsiflexion/Plantar flexion intact Incision: dressing C/D/I and no drainage No cellulitis present Compartment soft   Lab Results  Component Value Date   WBC 6.8 11/18/2016   HGB 10.4 (L) 11/18/2016   HCT 32.2 (L) 11/18/2016   MCV 90.4 11/18/2016   PLT 173 11/18/2016     Assessment/Plan:  1 Day Post-Op   - Expected postop acute blood loss anemia - will monitor for symptoms - Up with PT/OT - DVT ppx - SCDs, ambulation, aspirin - WBAT operative extremity - Pain control, tramadol added, toradol q6h - Discharge planning - will plan on sending to SNF post hospital discharge  Kelly Perkins 11/18/2016, 7:27 AM [phone]

## 2016-11-18 NOTE — Progress Notes (Signed)
Physical Therapy Treatment Patient Details Name: Kelly Perkins MRN: [id number] DOB: [birth year] Today's Date: 11/18/2016    History of Present Illness Pt is a 66 y.o. 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 Pertinent PMH includes OA, HTN, HLD, GERD.   PT Comments    Pt progressing with mobility, ambulating short distances with RW and min guard; limited by fatigue and pain with mobility. Slowed, antalgic gait indicates increased risk for falls. Reviewed positioning, CPM use, fall risk reduction, and importance of mobility. Placed in CPM at end of session (currently tolerating 0-65' flexion). Pt's daughter present throughout session and very supportive. Will continue to follow acutely.   Follow Up Recommendations  DC plan and follow up therapy as arranged by surgeon;Supervision for mobility/OOB     Equipment Recommendations  Rolling walker with 5" wheels;3in1 (PT)    Recommendations for Other Services       Precautions / Restrictions Precautions Precautions: Knee Precaution Booklet Issued: No Precaution Comments: Reviewed precautions Restrictions Weight Bearing Restrictions: Yes LLE Weight Bearing: Weight bearing as tolerated    Mobility  Bed Mobility Overal bed mobility: Needs Assistance Bed Mobility: Supine to Sit;Sit to Supine     Supine to sit: Min guard;HOB elevated Sit to supine: Min assist   General bed mobility comments: MinA to assist LLE back into bed  Transfers Overall transfer level: Needs assistance Equipment used: Rolling walker (2 wheeled) Transfers: Sit to/from Stand Sit to Stand: Min guard         General transfer comment: Stood x2 from bed and toilet with RW and min guard for balance; cues for technique with RW  Ambulation/Gait Ambulation/Gait assistance: Min guard Ambulation Distance (Feet): 60 Feet Assistive device: Rolling walker (2 wheeled) Gait Pattern/deviations: Step-through pattern;Decreased weight shift to left;Decreased stride  length;Antalgic;Trunk flexed;Wide base of support Gait velocity: Decreased Gait velocity interpretation: <1.8 ft/sec, indicative of risk for recurrent falls General Gait Details: Amb with RW and min guard for balance; cues for heel-to-toe gait pattern to maximize L knee ext. Increased time required secondary to increased pain and fatigue   Stairs            Wheelchair Mobility    Modified Rankin (Stroke Patients Only)       Balance Overall balance assessment: Needs assistance Sitting-balance support: No upper extremity supported;Feet supported Sitting balance-Leahy Scale: Good     Standing balance support: Bilateral upper extremity supported;During functional activity Standing balance-Leahy Scale: Poor Standing balance comment: Reliance on BUE support of RW                            Cognition Arousal/Alertness: Awake/alert Behavior During Therapy: WFL for tasks assessed/performed Overall Cognitive Status: Within Functional Limits for tasks assessed                                        Exercises      General Comments General comments (skin integrity, edema, etc.): Pt with increased L Knee pain upon sitting EOB. Pt reports that she lives alone and plans to go to rehab before going home.       Pertinent Vitals/Pain Pain Assessment: Faces Faces Pain Scale: Hurts little more Pain Location: L knee Pain Descriptors / Indicators: Aching;Discomfort Pain Intervention(s): Limited activity within patient's tolerance;Monitored during session;Ice applied    Home Living Family/patient expects to be discharged to:: Skilled nursing  facility Living Arrangements: Alone                  Prior Function Level of Independence: Independent          PT Goals (current goals can now be found in the care plan section) Acute Rehab PT Goals Patient Stated Goal: Get continued rehab before going home PT Goal Formulation: With patient Time For Goal  Achievement: 12/02/16 Potential to Achieve Goals: Good Progress towards PT goals: Progressing toward goals    Frequency    7X/week      PT Plan Current plan remains appropriate    Co-evaluation              AM-PAC PT "6 Clicks" Daily Activity  Outcome Measure  Difficulty turning over in bed (including adjusting bedclothes, sheets and blankets)?: A Little Difficulty moving from lying on back to sitting on the side of the bed? : Unable Difficulty sitting down on and standing up from a chair with arms (e.g., wheelchair, bedside commode, etc,.)?: A Little Help needed moving to and from a bed to chair (including a wheelchair)?: A Little Help needed walking in hospital room?: A Little Help needed climbing 3-5 steps with a railing? : A Little 6 Click Score: 16    End of Session Equipment Utilized During Treatment: Gait belt Activity Tolerance: Patient tolerated treatment well Patient left: in bed;with call bell/phone within reach;with family/visitor present;in CPM Nurse Communication: Mobility status PT Visit Diagnosis: Other abnormalities of gait and mobility (R26.89);Pain Pain - Right/Left: Left Pain - part of body: Knee     Time: 7824-2353 PT Time Calculation (min) (ACUTE ONLY): 17 min  Charges:  $Gait Training: 8-22 mins                    G Codes:      Mabeline Caras, PT, DPT Acute Rehab Services  Pager: [place] 11/18/2016, 3:31 PM

## 2016-11-18 NOTE — Evaluation (Signed)
Physical Therapy Evaluation Patient Details Name: Kelly Perkins MRN: [id number] DOB: 08-24-1950 Today's Date: 11/18/2016   History of Present Illness  Pt is a 66 y.o. 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 Pertinent PMH includes OA, HTN, HLD, GERD.  Clinical Impression  Pt presents with pain and an overall decrease in functional mobility secondary to above. PTA, pt indep and lives alone. Educ on precautions, positioning, therex, and importance of mobility. Today, pt able to transfer and amb with RW and min guard for balance; requires significant time and effort for mobility secondary to increased LLE pain. Pt states she is planning to receive continued rehab at SNF prior to d/c home. Pt would benefit from continued acute PT services to maximize functional mobility and independence.     Follow Up Recommendations DC plan and follow up therapy as arranged by surgeon;Supervision for mobility/OOB    Equipment Recommendations  Rolling walker with 5" wheels;3in1 (PT)    Recommendations for Other Services       Precautions / Restrictions Precautions Precautions: Knee Precaution Booklet Issued: No Precaution Comments: Reviewed precautions and resting knee in ext Restrictions Weight Bearing Restrictions: Yes LLE Weight Bearing: Weight bearing as tolerated      Mobility  Bed Mobility Overal bed mobility: Needs Assistance Bed Mobility: Sit to Supine       Sit to supine: HOB elevated;Mod assist   General bed mobility comments: ModA to assist LLE back into bed; min guard to scoot up in bed with use of BUEs and RLE  Transfers Overall transfer level: Needs assistance Equipment used: Rolling walker (2 wheeled) Transfers: Sit to/from Stand Sit to Stand: Min guard         General transfer comment: Stood x2 from chair and toilet with RW and min guard for balance; cues for hand placement and technique with RW  Ambulation/Gait Ambulation/Gait assistance: Min guard Ambulation Distance (Feet):  40 Feet Assistive device: Rolling walker (2 wheeled) Gait Pattern/deviations: Step-to pattern;Step-through pattern;Decreased weight shift to left;Antalgic;Trunk flexed Gait velocity: Decreased Gait velocity interpretation: <1.8 ft/sec, indicative of risk for recurrent falls General Gait Details: Amb in room with RW and min guard for balance; able to progress to step-through pattern with cues for technique. Significant time and effort required secondary to LLE pain  Stairs            Wheelchair Mobility    Modified Rankin (Stroke Patients Only)       Balance Overall balance assessment: Needs assistance Sitting-balance support: No upper extremity supported;Feet supported Sitting balance-Leahy Scale: Good Sitting balance - Comments: Sat in chair >5 min with supervision while taking medications with RN   Standing balance support: Single extremity supported;Bilateral upper extremity supported;During functional activity Standing balance-Leahy Scale: Poor Standing balance comment: Able to stand at toilet with single UE support on RW                             Pertinent Vitals/Pain Pain Assessment: 0-10 Pain Score: 8  Pain Location: L knee Pain Descriptors / Indicators: Aching;Discomfort;Grimacing;Guarding Pain Intervention(s): Limited activity within patient's tolerance;Monitored during session;RN gave pain meds during session    Home Living Family/patient expects to be discharged to:: Skilled nursing facility Living Arrangements: Alone                    Prior Function Level of Independence: Independent               Hand  Dominance        Extremity/Trunk Assessment   Upper Extremity Assessment Upper Extremity Assessment: Overall WFL for tasks assessed    Lower Extremity Assessment Lower Extremity Assessment: LLE deficits/detail LLE Deficits / Details: L hip flexion 3/5       Communication   Communication: No difficulties  Cognition  Arousal/Alertness: Awake/alert Behavior During Therapy: WFL for tasks assessed/performed Overall Cognitive Status: Within Functional Limits for tasks assessed                                        General Comments      Exercises Total Joint Exercises Ankle Circles/Pumps: AROM;Both;10 reps;Seated Quad Sets: Strengthening;Left;10 reps;Seated Short Arc Quad: AROM;Left;10 reps;Seated Heel Slides: AAROM;Left;10 reps;Seated   Assessment/Plan    PT Assessment Patient needs continued PT services  PT Problem List Decreased range of motion;Decreased strength;Decreased activity tolerance;Decreased balance;Decreased mobility;Decreased knowledge of use of DME;Decreased knowledge of precautions;Pain       PT Treatment Interventions DME instruction;Gait training;Stair training;Functional mobility training;Therapeutic activities;Therapeutic exercise;Balance training;Patient/family education    PT Goals (Current goals can be found in the Care Plan section)  Acute Rehab PT Goals Patient Stated Goal: Get continued rehab before going home PT Goal Formulation: With patient Time For Goal Achievement: 12/02/16 Potential to Achieve Goals: Good    Frequency 7X/week   Barriers to discharge Decreased caregiver support      Co-evaluation               AM-PAC PT "6 Clicks" Daily Activity  Outcome Measure Difficulty turning over in bed (including adjusting bedclothes, sheets and blankets)?: A Little Difficulty moving from lying on back to sitting on the side of the bed? : Unable Difficulty sitting down on and standing up from a chair with arms (e.g., wheelchair, bedside commode, etc,.)?: A Little Help needed moving to and from a bed to chair (including a wheelchair)?: A Little Help needed walking in hospital room?: A Little Help needed climbing 3-5 steps with a railing? : A Little 6 Click Score: 16    End of Session Equipment Utilized During Treatment: Gait belt Activity  Tolerance: Patient tolerated treatment well;Patient limited by pain Patient left: in bed;with call bell/phone within reach Nurse Communication: Mobility status PT Visit Diagnosis: Other abnormalities of gait and mobility (R26.89);Pain Pain - Right/Left: Left Pain - part of body: Knee    Time: 1015-1046 PT Time Calculation (min) (ACUTE ONLY): 31 min   Charges:   PT Evaluation $PT Eval Moderate Complexity: 1 Mod PT Treatments $Therapeutic Exercise: 8-22 mins   PT G Codes:       Mabeline Caras, PT, DPT Acute Rehab Services  Pager: [place] 11/18/2016, 10:56 AM

## 2016-11-18 NOTE — Therapy (Signed)
Occupational Therapy Evaluation Patient Details Name: Kelly Perkins MRN: [id number] DOB: 05/30/1950 Today's Date: 11/18/2016    History of Present Illness Pt is a 66 y.o. 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 Pertinent PMH includes OA, HTN, HLD, GERD.   Clinical Impression   Pt reports being independent in ADLs and IADLs PTA. Pt currently requires min assist for LB ADLs, toilet transfer, and functional mobility due to balance and increased pain. Pt requires supervision for UB ADLs and grooming. Pt reports living alone with no one available to provide 24 hour assistance. Pt would benefit from skilled rehab to promote return to PLOF. OT will follow acutely to address established goals.     Follow Up Recommendations  SNF;Supervision/Assistance - 24 hour    Equipment Recommendations  None recommended by OT    Recommendations for Other Services       Precautions / Restrictions Precautions Precautions: Knee Precaution Booklet Issued: No Precaution Comments: Reviewed precautions Restrictions Weight Bearing Restrictions: Yes LLE Weight Bearing: Weight bearing as tolerated      Mobility Bed Mobility Overal bed mobility: Needs Assistance Bed Mobility: Supine to Sit;Sit to Supine     Supine to sit: Mod assist Sit to supine: Mod assist   General bed mobility comments: Moderate assistance for LLE to get back in bed.   Transfers Overall transfer level: Needs assistance Equipment used: Rolling walker (2 wheeled) Transfers: Sit to/from Stand Sit to Stand: Min assist         General transfer comment: Min assist to boost up.    Balance Overall balance assessment: Needs assistance Sitting-balance support: No upper extremity supported;Feet supported Sitting balance-Leahy Scale: Good Sitting balance - Comments:  Standing balance support: Bilateral upper extremity supported;During functional activity Standing balance-Leahy Scale: Poor Standing balance comment: Heavy reliance on RW  during functional mobility.                           ADL either performed or assessed with clinical judgement   ADL Overall ADL's : Needs assistance/impaired     Grooming: Supervision/safety;Set up;Sitting   Upper Body Bathing: Supervision/ safety;Set up;Sitting   Lower Body Bathing: Minimal assistance;Sitting/lateral leans   Upper Body Dressing : Supervision/safety;Set up;Sitting   Lower Body Dressing: Minimal assistance;Sitting/lateral leans   Toilet Transfer: Minimal assistance;Ambulation;RW   Toileting- Clothing Manipulation and Hygiene: Supervision/safety;Set up;Sitting/lateral lean   Tub/ Shower Transfer: Minimal assistance;Ambulation;Rolling walker   Functional mobility during ADLs: Minimal assistance;Rolling walker General ADL Comments: Pt educated on compensatory techniques to complete LB ADLs. Pt unable to bring foot over knee to don LB clothing.      Vision         Perception     Praxis      Pertinent Vitals/Pain Pain Assessment: Faces Pain Score: 8  Faces Pain Scale: Hurts whole lot Pain Location: L knee Pain Descriptors / Indicators: Aching;Grimacing;Guarding;Tender Pain Intervention(s): Limited activity within patient's tolerance;Monitored during session;Ice applied     Hand Dominance     Extremity/Trunk Assessment Upper Extremity Assessment Upper Extremity Assessment: Overall WFL for tasks assessed   Lower Extremity Assessment Lower Extremity Assessment: Defer to PT evaluation    Cervical / Trunk Assessment Cervical / Trunk Assessment: Normal   Communication Communication Communication: No difficulties   Cognition Arousal/Alertness: Awake/alert Behavior During Therapy: WFL for tasks assessed/performed Overall Cognitive Status: Within Functional Limits for tasks assessed  General Comments  Pt with increased L Knee pain upon sitting EOB. Pt reports that she lives alone and  plans to go to rehab before going home in order to return to PLOF.     Exercises   Shoulder Instructions      Home Living Family/patient expects to be discharged to:: Skilled nursing facility Living Arrangements: Alone                                      Prior Functioning/Environment Level of Independence: Independent                 OT Problem List: Decreased strength;Decreased range of motion;Decreased knowledge of use of DME or AE;Decreased knowledge of precautions;Pain      OT Treatment/Interventions:      OT Goals(Current goals can be found in the care plan section) Acute Rehab OT Goals Patient Stated Goal: Get continued rehab before going home OT Goal Formulation: With patient  OT Frequency:     Barriers to D/C:            Co-evaluation              AM-PAC PT "6 Clicks" Daily Activity     Outcome Measure Help from another person eating meals?: None Help from another person taking care of personal grooming?: A Little Help from another person toileting, which includes using toliet, bedpan, or urinal?: A Little Help from another person bathing (including washing, rinsing, drying)?: A Little Help from another person to put on and taking off regular upper body clothing?: None Help from another person to put on and taking off regular lower body clothing?: A Little 6 Click Score: 20   End of Session Equipment Utilized During Treatment: Gait belt;Rolling walker CPM Left Knee CPM Left Knee: Off Additional Comments: Pt declined CPM use secondary to increased pain; educ on CPM use and importance of use Nurse Communication: Mobility status;Patient requests pain meds  Activity Tolerance: Patient tolerated treatment well Patient left: in bed;with call bell/phone within reach  OT Visit Diagnosis: Unsteadiness on feet (R26.81);Pain Pain - Right/Left: Left Pain - part of body: Knee                Time: 1150-1205 OT Time Calculation (min): 15  min Charges:  OT General Charges $OT Visit: 1 Visit OT Evaluation $OT Eval Moderate Complexity: 1 Mod G-Codes:     Boykin Peek, OTS [phone]   Boykin Peek 11/18/2016, 1:31 PM

## 2016-11-18 NOTE — Clinical Social Work Note (Signed)
Clinical Social Work Assessment  Patient Details  Name: Kelly Perkins MRN: [id number] Date of Birth: 04/23/1950  Date of referral:  11/18/16               Reason for consult:  Facility Placement                Permission sought to share information with:  Chartered certified accountant granted to share information::  Yes, Verbal Permission Granted  Name::        Agency::  [facility]  Relationship::     Contact Information:     Housing/Transportation Living arrangements for the past 2 months:  Single Family Home Source of Information:  Patient Patient Interpreter Needed:  None Criminal Activity/Legal Involvement Pertinent to Current Situation/Hospitalization:  No - Comment as needed Significant Relationships:  Other Family Members Lives with:  Self Do you feel safe going back to the place where you live?  No Need for family participation in patient care:  No (Coment)  Care giving concerns:  Patient resided alone prior to impairment. Pt has no one to care for her at home with new impairment. Pt agreeable to SNF for short term rehab.  Social Worker assessment / plan:  CSW met with patient at bedside to discuss SNF at DC. Pt agreeable. CSW explained the SNF options/process. Pt confirmed that she has never been to SNF before however does want to go to [facility]. CSW advised that patient wld need to get insurance auth for SNF placement. Pt agreeable and understands that her options might be changed if we do not have insurance auth timely.  CSW reviewed chart and no PT/OT evaluations yet. CSW paged staff and they indicated they will f/u.  Passr confirmed. FL2 pending as PT/OT. No bed offers sent yet.  Employment status:  Retired Forensic scientist:    PT Recommendations:  [place] / Referral to community resources:  [place]  Patient/Family's Response to care:  Patient has good understanding of SNF placement and amenable. No  issues or concerns at this time.  Patient/Family's Understanding of and Emotional Response to Diagnosis, Current Treatment, and Prognosis:  Patient has good understanding of diagnosis, current treatment and prognosis. Pt hopeful that she will improve with short term rehab. No issues or concerns at this time.  Emotional Assessment Appearance:  Appears stated age Attitude/Demeanor/Rapport:   (Cooperative) Affect (typically observed):  Accepting, Appropriate Orientation:  Oriented to Situation, Oriented to  Time, Oriented to Place, Oriented to Self Alcohol / Substance use:  Not Applicable Psych involvement (Current and /or in the community):  No (Comment)  Discharge Needs  Concerns to be addressed:  Care Coordination Readmission within the last 30 days:  No Current discharge risk:  Dependent with Mobility, Physical Impairment Barriers to Discharge:  No Barriers Identified   Normajean Baxter, LCSW 11/18/2016, 10:41 AM

## 2016-11-18 NOTE — Social Work (Signed)
OT/PT evaulations pending.  Pt will need insurance auth to go to SNF.  CSW will continue to follow.  Elissa Hefty, LCSW Clinical Social Worker [phone]

## 2016-11-19 MED ORDER — MUPIROCIN 2 % EX OINT
1.0000 "application " | TOPICAL_OINTMENT | Freq: Two times a day (BID) | CUTANEOUS | Status: DC
  Administered 2016-11-19 – 2016-11-21 (×5): 1 via NASAL
  Filled 2016-11-19: qty 22

## 2016-11-19 MED ORDER — CHLORHEXIDINE GLUCONATE CLOTH 2 % EX PADS
6.0000 | MEDICATED_PAD | Freq: Every day | CUTANEOUS | Status: DC
  Administered 2016-11-21: 6 via TOPICAL

## 2016-11-19 NOTE — Care Management Note (Signed)
Case Management Note  Patient Details  Name: BRELAND ELDERS MRN: [id number] Date of Birth: [birth year]  Subjective/Objective:  66 y.o F to be discharged with HHPT and RW. I have alerted [facility] and Jermaine, the rep for that agency will coordinate that care. DME will be delivered prior to discharge                  Action/Plan:CM will sign off for now but will be available should additional discharge needs arise or disposition change.    Expected Discharge Date:                  Expected Discharge Plan:     In-House Referral:     Discharge planning Services  CM Consult  Post Acute Care Choice:  Home Health, Durable Medical Equipment Choice offered to:  Patient  DME Arranged:  Walker rolling DME Agency:  [place]:  PT [facility] Agency:  [place]  Status of Service:  Completed, signed off  If discussed at [place] of Stay Meetings, dates discussed:    Additional Comments:  Delrae Sawyers, RN 11/19/2016, 3:22 PM

## 2016-11-19 NOTE — Progress Notes (Signed)
Physical Therapy Treatment Patient Details Name: Kelly Perkins MRN: [id number] DOB: 02-12-1951 Today's Date: 11/19/2016    History of Present Illness Pt is a 66 y.o. 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 Pertinent PMH includes OA, HTN, HLD, GERD.    PT Comments    Pt remains pleasant and moving well. Pt encouraged to get in CPM after lunch and continue gait and HEP throughout the day. Will continue to follow.    Follow Up Recommendations  DC plan and follow up therapy as arranged by surgeon;Supervision for mobility/OOB     Equipment Recommendations  Rolling walker with 5" wheels;3in1 (PT)    Recommendations for Other Services       Precautions / Restrictions Precautions Precautions: Knee Precaution Comments: Reviewed precaution Restrictions Weight Bearing Restrictions: Yes LLE Weight Bearing: Weight bearing as tolerated    Mobility  Bed Mobility Overal bed mobility: Needs Assistance Bed Mobility: Sit to Supine     Supine to sit: HOB elevated Sit to supine: Min assist   General bed mobility comments: min assist to bring LLE into bed  Transfers Overall transfer level: Modified independent          Ambulation/Gait Ambulation/Gait assistance: Supervision Ambulation Distance (Feet): 150 Feet Assistive device: Rolling walker (2 wheeled) Gait Pattern/deviations: Step-through pattern   Gait velocity interpretation: Below normal speed for age/gender General Gait Details: cues for posture and position in RW as well as left heel strike   Stairs            Wheelchair Mobility    Modified Rankin (Stroke Patients Only)       Balance                                            Cognition Arousal/Alertness: Awake/alert Behavior During Therapy: WFL for tasks assessed/performed Overall Cognitive Status: Within Functional Limits for tasks assessed                                        Exercises Total Joint Exercises Heel  Slides: AAROM;Left;10 reps;Supine Hip ABduction/ADduction: AROM;Left;Supine;10 reps Straight Leg Raises: Left;Supine;10 reps;AROM Long Arc Quad: AAROM;Left;Seated;10 reps      General Comments        Pertinent Vitals/Pain Pain Assessment: No/denies pain Pain Score: 2  Pain Location: L knee Pain Descriptors / Indicators: Sore Pain Intervention(s): Limited activity within patient's tolerance;Repositioned    Home Living                      Prior Function            PT Goals (current goals can now be found in the care plan section) Progress towards PT goals: Progressing toward goals    Frequency           PT Plan Current plan remains appropriate    Co-evaluation              AM-PAC PT "6 Clicks" Daily Activity  Outcome Measure  Difficulty turning over in bed (including adjusting bedclothes, sheets and blankets)?: A Little Difficulty moving from lying on back to sitting on the side of the bed? : A Lot Difficulty sitting down on and standing up from a chair with arms (e.g., wheelchair, bedside commode, etc,.)?: A Little Help needed  moving to and from a bed to chair (including a wheelchair)?: A Little Help needed walking in hospital room?: A Little Help needed climbing 3-5 steps with a railing? : A Little 6 Click Score: 17    End of Session Equipment Utilized During Treatment: Gait belt Activity Tolerance: Patient tolerated treatment well Patient left: in bed;with call bell/phone within reach Nurse Communication: Mobility status PT Visit Diagnosis: Other abnormalities of gait and mobility (R26.89);Pain Pain - Right/Left: Left Pain - part of body: Knee     Time: 4196-2229 PT Time Calculation (min) (ACUTE ONLY): 13 min  Charges:  $Gait Training: 8-22 mins $Therapeutic Exercise: 8-22 mins                    G Codes:       Elwyn Reach, PT [phone]    [place] 11/19/2016, 11:37 AM

## 2016-11-19 NOTE — Progress Notes (Signed)
LCSW following for discharge planning  Chart reviewed and met with patient and family at bedside. Recommendation is for SNF, however patient is doing too well for SNF and insurance will not cover cost. Patient cannot pay privately per report, she reports she will go home and family in agreement.  CM Jeannie contacted, updated regarding need for home health. CM to follow up. LCSW will sign off at this time. CM to follow up with home needs and DME  DC home due to insurance authorization denial.    LCSW, MSW Clinical Social Work: [employer] Coverage for :  [phone] 

## 2016-11-19 NOTE — Progress Notes (Signed)
Physical Therapy Treatment Patient Details Name: Kelly Perkins MRN: [id number] DOB: 1950-09-02 Today's Date: 11/19/2016    History of Present Illness Pt is a 66 y.o. 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 Pertinent PMH includes OA, HTN, HLD, GERD.    PT Comments    Pt progressing with gait and HEP. Pt slightly lethargic and easily drifting to sleep at rest. Pt educated for knee precautions, progression, gait and function. Will continue to follow.    Follow Up Recommendations  DC plan and follow up therapy as arranged by surgeon;Supervision for mobility/OOB     Equipment Recommendations  Rolling walker with 5" wheels;3in1 (PT)    Recommendations for Other Services       Precautions / Restrictions Precautions Precautions: Knee Precaution Comments: Reviewed precaution Restrictions LLE Weight Bearing: Weight bearing as tolerated    Mobility  Bed Mobility Overal bed mobility: Modified Independent Bed Mobility: Supine to Sit     Supine to sit: HOB elevated     General bed mobility comments: use of rail and increased time with pt able to pivot to EOB without physical assist  Transfers Overall transfer level: Needs assistance   Transfers: Sit to/from Stand Sit to Stand: Min guard         General transfer comment: cues for hand placement and safety  Ambulation/Gait Ambulation/Gait assistance: Supervision Ambulation Distance (Feet): 150 Feet Assistive device: Rolling walker (2 wheeled) Gait Pattern/deviations: Step-through pattern   Gait velocity interpretation: Below normal speed for age/gender General Gait Details: pt initially with step to pattern with progression to step through with cues and assist for sequence as well as cues for left heel strike   Stairs            Wheelchair Mobility    Modified Rankin (Stroke Patients Only)       Balance                                            Cognition Arousal/Alertness:  Awake/alert Behavior During Therapy: WFL for tasks assessed/performed Overall Cognitive Status: Within Functional Limits for tasks assessed                                        Exercises Total Joint Exercises Hip ABduction/ADduction: AROM;Left;Supine;10 reps Straight Leg Raises: AAROM;Left;Supine;10 reps Long Arc Quad: AAROM;Left;Seated;10 reps Knee Flexion: AAROM;Left;Supine;10 reps Goniometric ROM: 0-60    General Comments        Pertinent Vitals/Pain Pain Score: 2  Pain Location: L knee Pain Descriptors / Indicators: Sore Pain Intervention(s): Limited activity within patient's tolerance;Monitored during session;Repositioned;Premedicated before session    Home Living                      Prior Function            PT Goals (current goals can now be found in the care plan section) Progress towards PT goals: Progressing toward goals    Frequency           PT Plan Current plan remains appropriate    Co-evaluation              AM-PAC PT "6 Clicks" Daily Activity  Outcome Measure  Difficulty turning over in bed (including adjusting bedclothes, sheets and blankets)?: A Lot  Difficulty moving from lying on back to sitting on the side of the bed? : A Lot Difficulty sitting down on and standing up from a chair with arms (e.g., wheelchair, bedside commode, etc,.)?: A Little Help needed moving to and from a bed to chair (including a wheelchair)?: A Little Help needed walking in hospital room?: A Little Help needed climbing 3-5 steps with a railing? : A Little 6 Click Score: 16    End of Session Equipment Utilized During Treatment: Gait belt Activity Tolerance: Patient tolerated treatment well Patient left: in chair;with call bell/phone within reach Nurse Communication: Mobility status       Time: 0822-0849 PT Time Calculation (min) (ACUTE ONLY): 27 min  Charges:  $Gait Training: 8-22 mins $Therapeutic Exercise: 8-22 mins                     G Codes:       Elwyn Reach, PT [phone]    [place] 11/19/2016, 9:53 AM

## 2016-11-19 NOTE — Progress Notes (Signed)
Subjective: Patient stable.  Working with physical therapy this morning.   Objective: Vital signs in last 24 hours: Temp:  [98.3 F (36.8 C)-98.8 F (37.1 C)] 98.8 F (37.1 C) (09/08 0515) Pulse Rate:  [86-109] 109 (09/08 0515) Resp:  [18] 18 (09/08 0515) BP: (121-158)/(61-81) 121/61 (09/08 0515) SpO2:  [95 %-100 %] 98 % (09/08 0515)  Intake/Output from previous day: 09/07 0701 - 09/08 0700 In: 360 [P.O.:360] Out: -  Intake/Output this shift: No intake/output data recorded.  Exam:  Dorsiflexion/Plantar flexion intact No cellulitis present  Labs:  Recent Labs  11/16/16 1032 11/18/16 0442  HGB 11.0* 10.4*    Recent Labs  11/16/16 1032 11/18/16 0442  WBC 5.0 6.8  RBC 3.77* 3.56*  HCT 34.0* 32.2*  PLT 228 173    Recent Labs  11/16/16 1032 11/18/16 0442  NA 139 135  K 3.9 4.0  CL 105 102  CO2 25 25  BUN 13 12  CREATININE 0.79 0.75  GLUCOSE 122* 118*  CALCIUM 9.5 8.5*    Recent Labs  11/16/16 1032  INR 1.05    Assessment/Plan: Plan to continue working with physical therapy with skilled nursing home placement on Monday   [employer] 11/19/2016, 8:57 AM

## 2016-11-19 NOTE — Progress Notes (Signed)
Occupational Therapy Treatment Patient Details Name: Kelly Perkins MRN: [id number] DOB: October 24, 1950 Today's Date: 11/19/2016    History of present illness Pt is a 66 y.o. 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 Pertinent PMH includes OA, HTN, HLD, GERD.   OT comments  Pt. Demonstrates safe technique with shower stall transfer.  Also reports she was able to complete LB dressing this am without use of A/E or physical assist from another person.  Has no questions or concerns at this time.  Clear for d/c from acute OT.  Will alert OTR/L to sign off.    Follow Up Recommendations  SNF;Supervision/Assistance - 24 hour    Equipment Recommendations  None recommended by OT    Recommendations for Other Services      Precautions / Restrictions Precautions Precautions: Knee Precaution Comments: Reviewed precaution Restrictions LLE Weight Bearing: Weight bearing as tolerated       Mobility Bed Mobility Overal bed mobility: Modified Independent Bed Mobility: Supine to Sit     Supine to sit: HOB elevated     General bed mobility comments: pt. seated in recliner at beginning and end of session  Transfers Overall transfer level: Needs assistance Equipment used: Rolling walker (2 wheeled) Transfers: Sit to/from [facility] Sit to Stand: Supervision Stand pivot transfers: Supervision       General transfer comment: cues for hand placement and safety    Balance                                           ADL either performed or assessed with clinical judgement   ADL Overall ADL's : Needs assistance/impaired                     Lower Body Dressing: Set up;Sitting/lateral leans;Sit to/from stand Lower Body Dressing Details (indicate cue type and reason): pt. dressed in shirt and shorts.  reports she was able to bend forward while seated and donned shorts over "sore leg first" then the other leg and pull up with no issue or need for A/E or physical  assist          Tub/ Shower Transfer: Walk-in shower;Supervision/safety;Rolling walker;Ambulation Tub/Shower Transfer Details (indicate cue type and reason): shower stall frame brought into room for tx. session.  pt. able to back up to shower frame with RW, S. verbalized and demonstrated stepping over the tub backwards, turning to R faucet, and then turning back and exiting over the frame with S.  agreed to have dtr. with her the first time she attempts shower at home.     General ADL Comments: pt. Demonstrating progress with LB dressing.  safe return demo of shower stall transfer.  reports she will be having continued therapy prior to home and dtr. lives down the street and is available intermittently.  stable for d/c from acute OT.  OTR/L to sign off.                 Cognition Arousal/Alertness: Awake/alert Behavior During Therapy: WFL for tasks assessed/performed Overall Cognitive Status: Within Functional Limits for tasks assessed  General Comments      Pertinent Vitals/ Pain       Pain Assessment: No/denies pain Pain Score: 2  Pain Location: L knee Pain Descriptors / Indicators: Sore Pain Intervention(s): Limited activity within patient's tolerance;Monitored during session;Repositioned;Premedicated before session  Home Living                                          Prior Functioning/Environment              Frequency           Progress Toward Goals  OT Goals(current goals can now be found in the care plan section)  Progress towards OT goals: Goals met/education completed, patient discharged from [place] Discharge plan remains appropriate    Co-evaluation                 AM-PAC PT "6 Clicks" Daily Activity     Outcome Measure   Help from another person eating meals?: None Help from another person taking care of personal grooming?: A Little Help from  another person toileting, which includes using toliet, bedpan, or urinal?: A Little Help from another person bathing (including washing, rinsing, drying)?: A Little Help from another person to put on and taking off regular upper body clothing?: None Help from another person to put on and taking off regular lower body clothing?: A Little 6 Click Score: 20    End of Session  OT Visit Diagnosis: Unsteadiness on feet (R26.81);Pain Pain - Right/Left: Left Pain - part of body: Knee   Activity Tolerance Patient tolerated treatment well   Patient Left in chair;with call bell/phone within reach   Nurse Communication          Time: 1000-1009 OT Time Calculation (min): 9 min  Charges: OT General Charges $OT Visit: 1 Visit OT Treatments $Self Care/Home Management : 8-22 mins   Janice Coffin, COTA/L 11/19/2016, 11:05 AM

## 2016-11-20 NOTE — Progress Notes (Signed)
Physical Therapy Treatment Patient Details Name: Kelly Perkins MRN: [id number] DOB: [birth year] Today's Date: 11/20/2016    History of Present Illness Pt is a 66 y.o. 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 Pertinent PMH includes OA, HTN, HLD, GERD.    PT Comments    Patient continues to progress well toward mobility goals. Pt tolerated increased gait distance and stair training this session. Pt required min guard/supervision for mobility. Continue to progress as tolerated.    Follow Up Recommendations  DC plan and follow up therapy as arranged by surgeon;Supervision for mobility/OOB     Equipment Recommendations  Rolling walker with 5" wheels;3in1 (PT)    Recommendations for Other Services       Precautions / Restrictions Precautions Precautions: Knee Precaution Booklet Issued: No Precaution Comments: Reviewed precaution/positioning Restrictions Weight Bearing Restrictions: Yes LLE Weight Bearing: Weight bearing as tolerated    Mobility  Bed Mobility Overal bed mobility: Modified Independent             General bed mobility comments: increased time and effort  Transfers Overall transfer level: Modified independent Equipment used: Rolling walker (2 wheeled) Transfers: Sit to/from [facility] Sit to Stand: Supervision         General transfer comment: supervision for safety  Ambulation/Gait Ambulation/Gait assistance: Supervision Ambulation Distance (Feet): 300 Feet Assistive device: Rolling walker (2 wheeled) Gait Pattern/deviations: Step-through pattern;Decreased stance time - left;Decreased stride length Gait velocity: Decreased   General Gait Details: cues for L heel strike and posture   Stairs Stairs: Yes   Stair Management: One rail Left;Step to pattern;Sideways Number of Stairs: 10 General stair comments: cues for sequencing and technique; no knee instability noted  Wheelchair Mobility    Modified Rankin (Stroke Patients Only)        Balance Overall balance assessment: Needs assistance Sitting-balance support: No upper extremity supported;Feet supported Sitting balance-Leahy Scale: Good Sitting balance - Comments: Sat in chair >5 min with supervision while taking medications with RN   Standing balance support: Bilateral upper extremity supported;During functional activity Standing balance-Leahy Scale: Poor                              Cognition Arousal/Alertness: Awake/alert Behavior During Therapy: WFL for tasks assessed/performed Overall Cognitive Status: Within Functional Limits for tasks assessed                                        Exercises Total Joint Exercises Quad Sets: Left;10 reps;AROM;Supine Heel Slides: AROM;Left;10 reps;Supine Straight Leg Raises: AROM;Left;5 reps;Supine Goniometric ROM: 0-80    General Comments        Pertinent Vitals/Pain Pain Assessment: Faces Faces Pain Scale: Hurts little more Pain Location: L knee Pain Descriptors / Indicators: Sore Pain Intervention(s): Monitored during session;Repositioned;Ice applied    Home Living                      Prior Function            PT Goals (current goals can now be found in the care plan section) Acute Rehab PT Goals PT Goal Formulation: With patient Time For Goal Achievement: 12/02/16 Potential to Achieve Goals: Good Progress towards PT goals: Progressing toward goals    Frequency    7X/week      PT Plan Current plan remains appropriate  Co-evaluation              AM-PAC PT "6 Clicks" Daily Activity  Outcome Measure  Difficulty turning over in bed (including adjusting bedclothes, sheets and blankets)?: A Little Difficulty moving from lying on back to sitting on the side of the bed? : A Lot Difficulty sitting down on and standing up from a chair with arms (e.g., wheelchair, bedside commode, etc,.)?: A Little Help needed moving to and from a bed to chair  (including a wheelchair)?: A Little Help needed walking in hospital room?: None Help needed climbing 3-5 steps with a railing? : A Little 6 Click Score: 18    End of Session Equipment Utilized During Treatment: Gait belt Activity Tolerance: Patient tolerated treatment well Patient left: in bed;with call bell/phone within reach Nurse Communication: Mobility status PT Visit Diagnosis: Other abnormalities of gait and mobility (R26.89);Pain Pain - Right/Left: Left Pain - part of body: Knee     Time: 8333-8329 PT Time Calculation (min) (ACUTE ONLY): 26 min  Charges:  $Gait Training: 8-22 mins $Therapeutic Exercise: 8-22 mins                    G Codes:       Kelly Perkins, PTA Pager: [phone]     Kelly Perkins 11/20/2016, 3:34 PM

## 2016-11-20 NOTE — Progress Notes (Signed)
Subjective: Patient doing well.  CPM on 65.  She actually states she is doing too well to go to skilled nursing   Objective: Vital signs in last 24 hours: Temp:  [98.2 F (36.8 C)-99 F (37.2 C)] 99 F (37.2 C) (09/09 0300) Pulse Rate:  [95-106] 102 (09/09 0300) Resp:  [18-19] 18 (09/09 0300) BP: (127-158)/(69-80) 127/71 (09/09 0300) SpO2:  [93 %-95 %] 93 % (09/09 0300)  Intake/Output from previous day: 09/08 0701 - 09/09 0700 In: 1623.8 [P.O.:480; I.V.:1143.8] Out: -  Intake/Output this shift: No intake/output data recorded.  Exam:  Dorsiflexion/Plantar flexion intact  Labs:  Recent Labs  11/18/16 0442  HGB 10.4*    Recent Labs  11/18/16 0442  WBC 6.8  RBC 3.56*  HCT 32.2*  PLT 173    Recent Labs  11/18/16 0442  NA 135  K 4.0  CL 102  CO2 25  BUN 12  CREATININE 0.75  GLUCOSE 118*  CALCIUM 8.5*   No results for input(s): LABPT, INR in the last 72 hours.  Assessment/Plan: Impression is patient's doing well following knee replacement.  We will keep her here one more day for physical therapy.  Anticipate discharge to home tomorrow after she is seen by Dr. Darryll Capers 11/20/2016, 7:45 AM

## 2016-11-21 NOTE — Care Management Important Message (Signed)
Important Message  Patient Details  Name: Kelly Perkins MRN: [id number] Date of Birth: [birth year]   Medicare Important Message Given:  Yes    Renalda Locklin 11/21/2016, 1:21 PM

## 2016-11-21 NOTE — Care Management Note (Signed)
Case Management Note  Patient Details  Name: Kelly Perkins MRN: [id number] Date of Birth: 1950-07-01  Subjective/Objective:                    Action/Plan: Pt discharging home with self care. Pt already set up with [facility] prior to admission. CM met with the patient and she is in agreement with using [facility]. Malillany with [facility] made aware of d/c.  Pt with orders for walker and 3 in 1. Pt already had walker in her room. CM notified Jermaine with [facility] of the need for the 3 in 1 and he delivered the equipment to the room.  Pt has transportation home today.   Expected Discharge Date:  11/21/16               Expected Discharge Plan:  [place]  In-House Referral:     Discharge planning Services  CM Consult  Post Acute Care Choice:  Home Health, Durable Medical Equipment Choice offered to:  Patient  DME Arranged:  Walker rolling DME Agency:  [place]:  PT [place]:  [facility] (now [facility])  Status of Service:  Completed, signed off  If discussed at [place] of Stay Meetings, dates discussed:    Additional Comments:  Pollie Friar, RN 11/21/2016, 12:12 PM

## 2016-11-21 NOTE — Progress Notes (Signed)
Physical Therapy Treatment Patient Details Name: Kelly Perkins MRN: [id number] DOB: April 14, 1950 Today's Date: 11/21/2016    History of Present Illness Pt is a 66 y.o. 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 Pertinent PMH includes OA, HTN, HLD, GERD.    PT Comments    Pt continues moving well, able to complete hall ambulation and stairs without assist. Pt ambulated full flight of stairs as well as one backward with rail. Limited HEP and ROM due to pain this am and RN notified for meds. Will continue to follow.   Follow Up Recommendations  DC plan and follow up therapy as arranged by surgeon;Supervision for mobility/OOB     Equipment Recommendations  Rolling walker with 5" wheels;3in1 (PT)    Recommendations for Other Services       Precautions / Restrictions Precautions Precautions: Knee Restrictions LLE Weight Bearing: Weight bearing as tolerated    Mobility  Bed Mobility Overal bed mobility: Modified Independent Bed Mobility: Supine to Sit           General bed mobility comments: increased time with HOB 20 degrees - pt reports she is getting a craftmatic adjustable bed at home  Transfers Overall transfer level: Modified independent                  Ambulation/Gait Ambulation/Gait assistance: Supervision Ambulation Distance (Feet): 200 Feet Assistive device: Rolling walker (2 wheeled) Gait Pattern/deviations: Step-through pattern;Decreased stride length   Gait velocity interpretation: Below normal speed for age/gender General Gait Details: cues for Lt heel strike and posture   Stairs Stairs: Yes   Stair Management: One rail Left;Step to pattern;Sideways Number of Stairs: 10 General stair comments: no cues needed, supervision for safety  Wheelchair Mobility    Modified Rankin (Stroke Patients Only)       Balance                                            Cognition Arousal/Alertness: Awake/alert Behavior During Therapy: WFL for  tasks assessed/performed Overall Cognitive Status: Within Functional Limits for tasks assessed                                        Exercises Total Joint Exercises Hip ABduction/ADduction: AROM;Left;Supine;10 reps Long Arc Quad: AROM;Left;Seated;10 reps Goniometric ROM: 0-60    General Comments        Pertinent Vitals/Pain Pain Score: 6  Pain Location: L knee Pain Descriptors / Indicators: Sore Pain Intervention(s): Monitored during session;Patient requesting pain meds-RN notified    Home Living                      Prior Function            PT Goals (current goals can now be found in the care plan section) Progress towards PT goals: Progressing toward goals    Frequency           PT Plan Current plan remains appropriate    Co-evaluation              AM-PAC PT "6 Clicks" Daily Activity  Outcome Measure  Difficulty turning over in bed (including adjusting bedclothes, sheets and blankets)?: A Little Difficulty moving from lying on back to sitting on the side of the bed? : A  Little Difficulty sitting down on and standing up from a chair with arms (e.g., wheelchair, bedside commode, etc,.)?: A Little Help needed moving to and from a bed to chair (including a wheelchair)?: A Little Help needed walking in hospital room?: None Help needed climbing 3-5 steps with a railing? : None 6 Click Score: 20    End of Session   Activity Tolerance: Patient tolerated treatment well Patient left: in chair;with call bell/phone within reach Nurse Communication: Mobility status PT Visit Diagnosis: Other abnormalities of gait and mobility (R26.89);Pain Pain - Right/Left: Left Pain - part of body: Knee     Time: 0735-0800 PT Time Calculation (min) (ACUTE ONLY): 25 min  Charges:  $Gait Training: 8-22 mins $Therapeutic Exercise: 8-22 mins                    G Codes:       Delaney Meigs, PT [phone]    Brileigh Sevcik B Mayukha Symmonds 11/21/2016,  10:04 AM

## 2016-11-21 NOTE — Progress Notes (Signed)
Patient is doing well and stable.  Progressing very well with PT.  Dc home today with HHPT.

## 2016-11-21 NOTE — Discharge Summary (Signed)
Physician Discharge Summary      Patient ID: Kelly Perkins MRN: [id number] DOB/AGE: Aug 11, 1950 66 y.o.  Admit date: 11/17/2016 Discharge date: 11/21/2016  Admission Diagnoses:  <principal problem not specified>  Discharge Diagnoses:  Active Problems:   Total knee replacement status   Past Medical History:  Diagnosis Date  . GERD (gastroesophageal reflux disease)   . History of kidney stones   . History of lithotripsy    urolthiasis 2004  . Hyperlipidemia 09/10/2010  . Hypertension    no meds  . Hypothyroidism   . Kidney stones   . Normal cardiac stress test 6-12   had CP-DOE  . Osteoarthritis   . Wears dentures    top  . Wears glasses     Surgeries: Procedure(s): LEFT TOTAL KNEE ARTHROPLASTY on 11/17/2016   Consultants (if any):   Discharged Condition: Improved  Hospital Course: Kelly Perkins is an 66 y.o. female who was admitted 11/17/2016 with a diagnosis of <principal problem not specified> and went to the operating room on 11/17/2016 and underwent the above named procedures.    She was given perioperative antibiotics:  Anti-infectives    Start     Dose/Rate Route Frequency Ordered Stop   11/17/16 2200  ceFAZolin (ANCEF) 3 g in dextrose 5 % 50 mL IVPB     3 g 130 mL/hr over 30 Minutes Intravenous Every 8 hours 11/17/16 1653 11/18/16 1428   11/17/16 1200  ceFAZolin (ANCEF) IVPB 2g/100 mL premix     2 g 200 mL/hr over 30 Minutes Intravenous On call to O.R. 11/16/16 7416 11/17/16 1245    .  She was given sequential compression devices, early ambulation, and aspirin for DVT prophylaxis.  She benefited maximally from the hospital stay and there were no complications.    Recent vital signs:  Vitals:   11/20/16 1957 11/21/16 0400  BP: 136/74 124/67  Pulse: 99 88  Resp: 18 18  Temp: 98.1 F (36.7 C) 98 F (36.7 C)  SpO2: 94% 95%    Recent laboratory studies:  Lab Results  Component Value Date   HGB 10.4 (L) 11/18/2016   HGB 11.0 (L) 11/16/2016   HGB  11.2 (L) 06/24/2016   Lab Results  Component Value Date   WBC 6.8 11/18/2016   PLT 173 11/18/2016   Lab Results  Component Value Date   INR 1.05 11/16/2016   Lab Results  Component Value Date   NA 135 11/18/2016   K 4.0 11/18/2016   CL 102 11/18/2016   CO2 25 11/18/2016   BUN 12 11/18/2016   CREATININE 0.75 11/18/2016   GLUCOSE 118 (H) 11/18/2016    Discharge Medications:   Allergies as of 11/21/2016   No Known Allergies     Medication List    TAKE these medications   aspirin EC 325 MG tablet Take 1 tablet (325 mg total) by mouth 2 (two) times daily.   cyclobenzaprine 10 MG tablet Commonly known as:  FLEXERIL Take 1 tablet (10 mg total) by mouth at bedtime as needed for muscle spasms.   levothyroxine 150 MCG tablet Commonly known as:  SYNTHROID, LEVOTHROID Take 1 tablet (150 mcg total) by mouth daily before breakfast.   lisinopril-hydrochlorothiazide 10-12.5 MG tablet Commonly known as:  PRINZIDE,ZESTORETIC Take 1 tablet by mouth daily.   meloxicam 15 MG tablet Commonly known as:  MOBIC TAKE 1 TABLET BY MOUTH ONCE DAILY AS NEEDED.   methocarbamol 750 MG tablet Commonly known as:  ROBAXIN Take 1 tablet (750  mg total) by mouth 2 (two) times daily as needed for muscle spasms.   ondansetron 4 MG tablet Commonly known as:  ZOFRAN Take 1-2 tablets (4-8 mg total) by mouth every 8 (eight) hours as needed for nausea or vomiting.   oxyCODONE 5 MG immediate release tablet Commonly known as:  Oxy IR/ROXICODONE Take 1-3 tablets (5-15 mg total) by mouth every 4 (four) hours as needed.   oxyCODONE 10 mg 12 hr tablet Commonly known as:  OXYCONTIN Take 1 tablet (10 mg total) by mouth every 12 (twelve) hours.   pantoprazole 40 MG tablet Commonly known as:  PROTONIX Take 1 tablet (40 mg total) by mouth daily.   promethazine 25 MG tablet Commonly known as:  PHENERGAN Take 1 tablet (25 mg total) by mouth every 6 (six) hours as needed for nausea.   senna-docusate  8.6-50 MG tablet Commonly known as:  SENOKOT S Take 1 tablet by mouth at bedtime as needed.   simvastatin 20 MG tablet Commonly known as:  ZOCOR Take 1 tablet (20 mg total) by mouth at bedtime.   traMADol 50 MG tablet Commonly known as:  ULTRAM Take 1 tablet (50 mg total) by mouth every 12 (twelve) hours as needed.   zolpidem 10 MG tablet Commonly known as:  AMBIEN Take 1 tablet (10 mg total) by mouth at bedtime.            Durable Medical Equipment        Start     Ordered   11/19/16 1534  For home use only DME Walker rolling  Once    Question:  Patient needs a walker to treat with the following condition  Answer:  Immobility   11/19/16 1534   11/17/16 1654  DME Walker rolling  Once    Question:  Patient needs a walker to treat with the following condition  Answer:  Total knee replacement status   11/17/16 1653   11/17/16 1654  DME 3 n 1  Once     11/17/16 1653   11/17/16 1654  DME Bedside commode  Once    Question:  Patient needs a bedside commode to treat with the following condition  Answer:  Total knee replacement status   11/17/16 1653       Discharge Care Instructions        Start     Ordered   11/21/16 0000  Call MD / Call 911    Comments:  If you experience chest pain or shortness of breath, CALL 911 and be transported to the hospital emergency room.  If you develope a fever above 101.5 F, pus (white drainage) or increased drainage or redness at the wound, or calf pain, call your surgeon's office.   11/21/16 0813   11/21/16 0000  Constipation Prevention    Comments:  Drink plenty of fluids.  Prune juice may be helpful.  You may use a stool softener, such as Colace (over the counter) 100 mg twice a day.  Use MiraLax (over the counter) for constipation as needed.   11/21/16 0813   11/21/16 0000  Increase activity slowly as tolerated     11/21/16 0813   11/21/16 0000  Driving restrictions    Comments:  No driving while taking narcotic pain meds.   11/21/16  0813   11/17/16 0000  aspirin EC 325 MG tablet  2 times daily     11/17/16 1427   11/17/16 0000  methocarbamol (ROBAXIN) 750 MG tablet  2 times daily  PRN     11/17/16 1427   11/17/16 0000  ondansetron (ZOFRAN) 4 MG tablet  Every 8 hours PRN     11/17/16 1427   11/17/16 0000  oxyCODONE (OXY IR/ROXICODONE) 5 MG immediate release tablet  Every 4 hours PRN     11/17/16 1427   11/17/16 0000  oxyCODONE (OXYCONTIN) 10 mg 12 hr tablet  Every 12 hours     11/17/16 1427   11/17/16 0000  promethazine (PHENERGAN) 25 MG tablet  Every 6 hours PRN     11/17/16 1427   11/17/16 0000  senna-docusate (SENOKOT S) 8.6-50 MG tablet  At bedtime PRN     11/17/16 1427      Diagnostic Studies: Dg Knee Left Port  Result Date: 11/17/2016 CLINICAL DATA:  Total knee arthroplasty. EXAM: PORTABLE LEFT KNEE - 1-2 VIEW COMPARISON:  Left knee radiographs 07/29/2016 FINDINGS: The patient is status post left total knee arthroplasty. Gas and fluid are present in the joint. The knee is located. Disc space is symmetric. IMPRESSION: 1. Status post left total knee arthroplasty without radiographic evidence for complication. Electronically Signed   By: San Morelle M.D.   On: 11/17/2016 16:16    Disposition: 01-Home or Self Care  Discharge Instructions    Call MD / Call 911    Complete by:  As directed    If you experience chest pain or shortness of breath, CALL 911 and be transported to the hospital emergency room.  If you develope a fever above 101.5 F, pus (white drainage) or increased drainage or redness at the wound, or calf pain, call your surgeon's office.   Constipation Prevention    Complete by:  As directed    Drink plenty of fluids.  Prune juice may be helpful.  You may use a stool softener, such as Colace (over the counter) 100 mg twice a day.  Use MiraLax (over the counter) for constipation as needed.   Driving restrictions    Complete by:  As directed    No driving while taking narcotic pain meds.    Increase activity slowly as tolerated    Complete by:  As directed       Follow-up Information    Leandrew Koyanagi, MD Follow up in 2 week(s).   Specialty:  Orthopedic Surgery Why:  For suture removal, For wound re-check Contact information: [place] [id number] [place] Follow up.   Why:  RW will be delivered to your room prior to discharge Contact information: [address] [place] [address] [phone]        [facility] up.   Specialty:  [facility] Contact information: [place] [address] [phone]            Signed: Eduard Roux 11/21/2016, 8:14 AM

## 2016-11-21 NOTE — Progress Notes (Signed)
Pt discharged home with daughter. AVS and scripts given to patient and reviewed in full. All questions answered. DME delievered to room and [facility] set up by [facility] Blood pressure 123/72, pulse 90, temperature 98.1 F (36.7 C), temperature source Oral, resp. rate 19, height 5\' 3"  (1.6 m), weight 113.1 kg (249 lb 4.8 oz), SpO2 95 %.

## 2016-11-22 ENCOUNTER — Telehealth (INDEPENDENT_AMBULATORY_CARE_PROVIDER_SITE_OTHER): Payer: Self-pay | Admitting: Orthopaedic Surgery

## 2016-11-22 DIAGNOSIS — Z96652 Presence of left artificial knee joint: Secondary | ICD-10-CM | POA: Diagnosis not present

## 2016-11-22 DIAGNOSIS — Z471 Aftercare following joint replacement surgery: Secondary | ICD-10-CM | POA: Diagnosis not present

## 2016-11-22 DIAGNOSIS — Z96659 Presence of unspecified artificial knee joint: Secondary | ICD-10-CM | POA: Diagnosis not present

## 2016-11-22 DIAGNOSIS — Z7982 Long term (current) use of aspirin: Secondary | ICD-10-CM | POA: Diagnosis not present

## 2016-11-22 NOTE — Telephone Encounter (Signed)
Please advise 

## 2016-11-22 NOTE — Telephone Encounter (Signed)
yes

## 2016-11-22 NOTE — Telephone Encounter (Signed)
CALLED KATE TO ADVISE OK PER DR Erlinda Hong

## 2016-11-22 NOTE — Telephone Encounter (Signed)
Kelly Perkins (PT) with [facility] called left voicemail message stating completed patient start of care and need verbal orders 3 wk 2 for (PT) The number to contact Anda Kraft is [phone]

## 2016-11-23 DIAGNOSIS — Z96652 Presence of left artificial knee joint: Secondary | ICD-10-CM | POA: Diagnosis not present

## 2016-11-23 DIAGNOSIS — Z471 Aftercare following joint replacement surgery: Secondary | ICD-10-CM | POA: Diagnosis not present

## 2016-11-23 DIAGNOSIS — Z7982 Long term (current) use of aspirin: Secondary | ICD-10-CM | POA: Diagnosis not present

## 2016-11-25 DIAGNOSIS — Z7982 Long term (current) use of aspirin: Secondary | ICD-10-CM | POA: Diagnosis not present

## 2016-11-25 DIAGNOSIS — Z96652 Presence of left artificial knee joint: Secondary | ICD-10-CM | POA: Diagnosis not present

## 2016-11-25 DIAGNOSIS — Z471 Aftercare following joint replacement surgery: Secondary | ICD-10-CM | POA: Diagnosis not present

## 2016-11-28 DIAGNOSIS — Z7982 Long term (current) use of aspirin: Secondary | ICD-10-CM | POA: Diagnosis not present

## 2016-11-28 DIAGNOSIS — Z471 Aftercare following joint replacement surgery: Secondary | ICD-10-CM | POA: Diagnosis not present

## 2016-11-28 DIAGNOSIS — Z96652 Presence of left artificial knee joint: Secondary | ICD-10-CM | POA: Diagnosis not present

## 2016-11-30 DIAGNOSIS — Z471 Aftercare following joint replacement surgery: Secondary | ICD-10-CM | POA: Diagnosis not present

## 2016-11-30 DIAGNOSIS — Z96652 Presence of left artificial knee joint: Secondary | ICD-10-CM | POA: Diagnosis not present

## 2016-11-30 DIAGNOSIS — Z7982 Long term (current) use of aspirin: Secondary | ICD-10-CM | POA: Diagnosis not present

## 2016-12-01 ENCOUNTER — Ambulatory Visit (INDEPENDENT_AMBULATORY_CARE_PROVIDER_SITE_OTHER): Payer: Medicare HMO | Admitting: Orthopaedic Surgery

## 2016-12-01 ENCOUNTER — Encounter (INDEPENDENT_AMBULATORY_CARE_PROVIDER_SITE_OTHER): Payer: Self-pay | Admitting: Orthopaedic Surgery

## 2016-12-01 DIAGNOSIS — M1712 Unilateral primary osteoarthritis, left knee: Secondary | ICD-10-CM

## 2016-12-01 MED ORDER — TRAMADOL HCL 50 MG PO TABS: 50.0000 mg | ORAL_TABLET | Freq: Two times a day (BID) | ORAL | 0 refills | Status: DC | PRN

## 2016-12-01 NOTE — Progress Notes (Signed)
Patient is 2 weeks status post left total knee replacement. She is doing well and taking tramadol. She is progressing with home health physical therapy. Her range of motion is improving per the report. She does not have any real complaints.  Her surgical incision is healed without signs of infection. Expected postoperative swelling. Range of motion is 1-80. Calf is nontender.  Referral to outpatient physical therapy. Tramadol refilled. Questions encouraged and answered. Continue with aspirin for DVT prophylaxis. Follow-up in 4 weeks with 3 view x-rays of the left knee

## 2016-12-02 DIAGNOSIS — Z96652 Presence of left artificial knee joint: Secondary | ICD-10-CM | POA: Diagnosis not present

## 2016-12-02 DIAGNOSIS — Z7982 Long term (current) use of aspirin: Secondary | ICD-10-CM | POA: Diagnosis not present

## 2016-12-02 DIAGNOSIS — Z471 Aftercare following joint replacement surgery: Secondary | ICD-10-CM | POA: Diagnosis not present

## 2016-12-05 DIAGNOSIS — M25562 Pain in left knee: Secondary | ICD-10-CM | POA: Diagnosis not present

## 2016-12-05 DIAGNOSIS — M6281 Muscle weakness (generalized): Secondary | ICD-10-CM | POA: Diagnosis not present

## 2016-12-05 DIAGNOSIS — R262 Difficulty in walking, not elsewhere classified: Secondary | ICD-10-CM | POA: Diagnosis not present

## 2016-12-05 DIAGNOSIS — M25662 Stiffness of left knee, not elsewhere classified: Secondary | ICD-10-CM | POA: Diagnosis not present

## 2016-12-06 ENCOUNTER — Other Ambulatory Visit (INDEPENDENT_AMBULATORY_CARE_PROVIDER_SITE_OTHER): Payer: Self-pay | Admitting: Orthopaedic Surgery

## 2016-12-07 DIAGNOSIS — M25562 Pain in left knee: Secondary | ICD-10-CM | POA: Diagnosis not present

## 2016-12-07 DIAGNOSIS — M6281 Muscle weakness (generalized): Secondary | ICD-10-CM | POA: Diagnosis not present

## 2016-12-07 DIAGNOSIS — M25662 Stiffness of left knee, not elsewhere classified: Secondary | ICD-10-CM | POA: Diagnosis not present

## 2016-12-07 DIAGNOSIS — R262 Difficulty in walking, not elsewhere classified: Secondary | ICD-10-CM | POA: Diagnosis not present

## 2016-12-08 ENCOUNTER — Telehealth (INDEPENDENT_AMBULATORY_CARE_PROVIDER_SITE_OTHER): Payer: Self-pay | Admitting: Orthopaedic Surgery

## 2016-12-08 DIAGNOSIS — M6281 Muscle weakness (generalized): Secondary | ICD-10-CM | POA: Diagnosis not present

## 2016-12-08 DIAGNOSIS — M25662 Stiffness of left knee, not elsewhere classified: Secondary | ICD-10-CM | POA: Diagnosis not present

## 2016-12-08 DIAGNOSIS — R262 Difficulty in walking, not elsewhere classified: Secondary | ICD-10-CM | POA: Diagnosis not present

## 2016-12-08 DIAGNOSIS — M25562 Pain in left knee: Secondary | ICD-10-CM | POA: Diagnosis not present

## 2016-12-08 MED ORDER — OXYCODONE HCL 5 MG PO TABS: 5.0000 mg | ORAL_TABLET | Freq: Four times a day (QID) | ORAL | 0 refills | Status: DC | PRN

## 2016-12-08 NOTE — Telephone Encounter (Signed)
20

## 2016-12-08 NOTE — Telephone Encounter (Signed)
Patient called asking for a refill on oxycodone. CB # [id number]

## 2016-12-08 NOTE — Addendum Note (Signed)
Addended by: Precious Bard on: 12/08/2016 02:53 PM   Modules accepted: Orders

## 2016-12-08 NOTE — Telephone Encounter (Signed)
Rx printed will be ready for pick up tomorrow Friday AM pending Signature.

## 2016-12-08 NOTE — Telephone Encounter (Signed)
Please advise 

## 2016-12-09 NOTE — Telephone Encounter (Signed)
Called Patient advised her Rx ready for pick up at the front desk here in our office

## 2016-12-12 DIAGNOSIS — R262 Difficulty in walking, not elsewhere classified: Secondary | ICD-10-CM | POA: Diagnosis not present

## 2016-12-12 DIAGNOSIS — M25562 Pain in left knee: Secondary | ICD-10-CM | POA: Diagnosis not present

## 2016-12-12 DIAGNOSIS — M25662 Stiffness of left knee, not elsewhere classified: Secondary | ICD-10-CM | POA: Diagnosis not present

## 2016-12-12 DIAGNOSIS — M6281 Muscle weakness (generalized): Secondary | ICD-10-CM | POA: Diagnosis not present

## 2016-12-14 DIAGNOSIS — M25562 Pain in left knee: Secondary | ICD-10-CM | POA: Diagnosis not present

## 2016-12-14 DIAGNOSIS — R262 Difficulty in walking, not elsewhere classified: Secondary | ICD-10-CM | POA: Diagnosis not present

## 2016-12-14 DIAGNOSIS — M6281 Muscle weakness (generalized): Secondary | ICD-10-CM | POA: Diagnosis not present

## 2016-12-14 DIAGNOSIS — M25662 Stiffness of left knee, not elsewhere classified: Secondary | ICD-10-CM | POA: Diagnosis not present

## 2016-12-16 DIAGNOSIS — M25562 Pain in left knee: Secondary | ICD-10-CM | POA: Diagnosis not present

## 2016-12-16 DIAGNOSIS — M6281 Muscle weakness (generalized): Secondary | ICD-10-CM | POA: Diagnosis not present

## 2016-12-16 DIAGNOSIS — M25662 Stiffness of left knee, not elsewhere classified: Secondary | ICD-10-CM | POA: Diagnosis not present

## 2016-12-16 DIAGNOSIS — R262 Difficulty in walking, not elsewhere classified: Secondary | ICD-10-CM | POA: Diagnosis not present

## 2016-12-19 DIAGNOSIS — M6281 Muscle weakness (generalized): Secondary | ICD-10-CM | POA: Diagnosis not present

## 2016-12-19 DIAGNOSIS — M25562 Pain in left knee: Secondary | ICD-10-CM | POA: Diagnosis not present

## 2016-12-19 DIAGNOSIS — M25662 Stiffness of left knee, not elsewhere classified: Secondary | ICD-10-CM | POA: Diagnosis not present

## 2016-12-19 DIAGNOSIS — R262 Difficulty in walking, not elsewhere classified: Secondary | ICD-10-CM | POA: Diagnosis not present

## 2016-12-21 DIAGNOSIS — M25562 Pain in left knee: Secondary | ICD-10-CM | POA: Diagnosis not present

## 2016-12-21 DIAGNOSIS — R262 Difficulty in walking, not elsewhere classified: Secondary | ICD-10-CM | POA: Diagnosis not present

## 2016-12-21 DIAGNOSIS — M6281 Muscle weakness (generalized): Secondary | ICD-10-CM | POA: Diagnosis not present

## 2016-12-21 DIAGNOSIS — M25662 Stiffness of left knee, not elsewhere classified: Secondary | ICD-10-CM | POA: Diagnosis not present

## 2016-12-23 ENCOUNTER — Ambulatory Visit (INDEPENDENT_AMBULATORY_CARE_PROVIDER_SITE_OTHER): Payer: Medicare HMO

## 2016-12-23 ENCOUNTER — Ambulatory Visit (INDEPENDENT_AMBULATORY_CARE_PROVIDER_SITE_OTHER): Payer: Medicare HMO | Admitting: Orthopaedic Surgery

## 2016-12-23 ENCOUNTER — Encounter (INDEPENDENT_AMBULATORY_CARE_PROVIDER_SITE_OTHER): Payer: Self-pay | Admitting: Orthopaedic Surgery

## 2016-12-23 DIAGNOSIS — Z96652 Presence of left artificial knee joint: Secondary | ICD-10-CM

## 2016-12-23 NOTE — Progress Notes (Signed)
Subjective:   Kelly Perkins is a 66 y.o. female who presents for Medicare Annual (Subsequent) preventive examination.  Review of Systems:  No ROS.  Medicare Wellness Visit. Additional risk factors are reflected in the social history.  Cardiac Risk Factors include: advanced age (>21men, >18 women);dyslipidemia;hypertension Sleep patterns: Takes Ambien for sleep.   Female:   Pap-   Pt declines.    Mammo- last 06/16/16: BI-RADS CATEGORY  1: Negative      Dexa scan-  Last 06/24/15: normal CCS- last 09/03/15-recall 5 years     Objective:     Vitals: BP 132/68 (BP Location: Left Arm, Patient Position: Sitting, Cuff Size: Normal)   Pulse 85   Temp 98 F (36.7 C) (Oral)   Resp 14   Ht 5\' 3"  (1.6 m)   Wt 253 lb 6 oz (114.9 kg)   SpO2 96%   BMI 44.88 kg/m   Body mass index is 44.88 kg/m.   Tobacco History  Smoking Status  . Never Smoker  Smokeless Tobacco  . Never Used     Counseling given: No   Past Medical History:  Diagnosis Date  . GERD (gastroesophageal reflux disease)   . History of kidney stones   . History of lithotripsy    urolthiasis 2004  . Hyperlipidemia 09/10/2010  . Hypertension    no meds  . Hypothyroidism   . Kidney stones   . Normal cardiac stress test 6-12   had CP-DOE  . Osteoarthritis   . Wears dentures    top  . Wears glasses    Past Surgical History:  Procedure Laterality Date  . COLONOSCOPY     [id number],3710  . hysterectomy (unknown)  2000   B oophoreectomy, d/t DUB  . MASS EXCISION  09/12/2011   Procedure: EXCISION MASS;  Surgeon: Cristine Polio, MD;  Location: [facility];  Service: Plastics;  Laterality: Left;  left ring finger  . POLYPECTOMY     TA 2004  . THYROIDECTOMY    . TOTAL KNEE ARTHROPLASTY Left 11/17/2016   Procedure: LEFT TOTAL KNEE ARTHROPLASTY;  Surgeon: Leandrew Koyanagi, MD;  Location: [facility];  Service: Orthopedics;  Laterality: Left;   Family History  Problem Relation Age of Onset  . Hypertension Mother     . Coronary artery disease Mother        onset?  Marland Kitchen Hypertension Sister   . Colon polyps Other        niece  . Diabetes Neg Hx   . Colon cancer Neg Hx   . Breast cancer Neg Hx   . Stroke Neg Hx   . Rectal cancer Neg Hx   . Stomach cancer Neg Hx    History  Sexual Activity  . Sexual activity: No    Outpatient Encounter Prescriptions as of 12/28/2016  Medication Sig  . aspirin EC 325 MG tablet Take 1 tablet (325 mg total) by mouth 2 (two) times daily.  Marland Kitchen levothyroxine (SYNTHROID, LEVOTHROID) 150 MCG tablet Take 1 tablet (150 mcg total) by mouth daily before breakfast.  . lisinopril-hydrochlorothiazide (PRINZIDE,ZESTORETIC) 10-12.5 MG tablet Take 1 tablet by mouth daily.  . meloxicam (MOBIC) 15 MG tablet TAKE 1 TABLET BY MOUTH ONCE DAILY AS NEEDED.  . methocarbamol (ROBAXIN) 750 MG tablet Take 1 tablet (750 mg total) by mouth 2 (two) times daily as needed for muscle spasms.  . pantoprazole (PROTONIX) 40 MG tablet Take 1 tablet (40 mg total) by mouth daily.  . simvastatin (ZOCOR) 20 MG  tablet Take 1 tablet (20 mg total) by mouth at bedtime.  Marland Kitchen zolpidem (AMBIEN) 10 MG tablet Take 1 tablet (10 mg total) by mouth at bedtime.  . [DISCONTINUED] cyclobenzaprine (FLEXERIL) 10 MG tablet Take 1 tablet (10 mg total) by mouth at bedtime as needed for muscle spasms. (Patient not taking: Reported on 12/01/2016)  . [DISCONTINUED] ondansetron (ZOFRAN) 4 MG tablet Take 1-2 tablets (4-8 mg total) by mouth every 8 (eight) hours as needed for nausea or vomiting. (Patient not taking: Reported on 12/01/2016)  . [DISCONTINUED] oxyCODONE (OXY IR/ROXICODONE) 5 MG immediate release tablet Take 1-3 tablets (5-15 mg total) by mouth every 6 (six) hours as needed. (Patient not taking: Reported on 12/28/2016)  . [DISCONTINUED] oxyCODONE (OXYCONTIN) 10 mg 12 hr tablet Take 1 tablet (10 mg total) by mouth every 12 (twelve) hours. (Patient not taking: Reported on 12/01/2016)  . [DISCONTINUED] promethazine (PHENERGAN) 25 MG  tablet Take 1 tablet (25 mg total) by mouth every 6 (six) hours as needed for nausea. (Patient not taking: Reported on 12/01/2016)  . [DISCONTINUED] senna-docusate (SENOKOT S) 8.6-50 MG tablet Take 1 tablet by mouth at bedtime as needed. (Patient not taking: Reported on 12/01/2016)  . [DISCONTINUED] traMADol (ULTRAM) 50 MG tablet Take 1-2 tablets (50-100 mg total) by mouth 2 (two) times daily as needed.   No facility-administered encounter medications on file as of 12/28/2016.     Activities of Daily Living In your present state of health, do you have any difficulty performing the following activities: 12/28/2016 11/16/2016  Hearing? N N  Vision? N N  Comment Wears glasses for driving. [employer] yearly. -  Difficulty concentrating or making decisions? N N  Walking or climbing stairs? N Y  Dressing or bathing? N N  Doing errands, shopping? N N  Preparing Food and eating ? N -  Using the Toilet? N -  In the past six months, have you accidently leaked urine? N -  Do you have problems with loss of bowel control? N -  Managing your Medications? N -  Managing your Finances? N -  Housekeeping or managing your Housekeeping? N -  Some recent data might be hidden    Patient Care Team: Colon Branch, MD as PCP - General    Assessment:    Physical assessment deferred to PCP.  Exercise Activities and Dietary recommendations Current Exercise Habits: The patient does not participate in regular exercise at present, Exercise limited by: orthopedic condition(s) (knee sx 6 weeks ago) Diet (meal preparation, eat out, water intake, caffeinated beverages, dairy products, fruits and vegetables): in general, an "unhealthy" diet   Goals      Patient Stated   . Weight (lb) < 225 lb (102.1 kg) (pt-stated)          Return to gym and eat healthier.      Fall Risk Fall Risk  12/28/2016 06/24/2016 02/22/2016 06/22/2015 10/16/2014  Falls in the past year? No Yes No No No  Number falls in past yr: - 1 - -  -  Injury with Fall? - No - - -  Follow up - Falls evaluation completed - - -   Depression Screen PHQ 2/9 Scores 12/28/2016 06/24/2016 02/22/2016 06/22/2015  PHQ - 2 Score 0 0 0 0     Cognitive Function Ad8 score reviewed for issues:  Issues making decisions:no  Less interest in hobbies / activities:no  Repeats questions, stories (family complaining):no  Trouble using ordinary gadgets (microwave, computer, phone):no  Forgets the month or  year: no  Mismanaging finances: no  Remembering appts:no  Daily problems with thinking and/or memory:no Ad8 score is=0       Immunization History  Administered Date(s) Administered  . Influenza Split 12/23/2013, 11/06/2014  . Influenza-Unspecified 12/11/2015, 11/09/2016  . Pneumococcal Conjugate-13 06/22/2015  . Pneumococcal Polysaccharide-23 01/03/2014  . Td 04/13/2007  . Zoster 08/25/2014   Screening Tests Health Maintenance  Topic Date Due  . TETANUS/TDAP  04/12/2017  . MAMMOGRAM  06/16/2017  . COLONOSCOPY  09/02/2020  . INFLUENZA VACCINE  Completed  . DEXA SCAN  Completed  . Hepatitis C Screening  Completed  . PNA vac Low Risk Adult  Completed      Plan:   Follow up with Dr.Paz as directed.  Continue to eat heart healthy diet (full of fruits, vegetables, whole grains, lean protein, water--limit salt, fat, and sugar intake) and increase physical activity as tolerated.  Continue doing brain stimulating activities (puzzles, reading, adult coloring books, staying active) to keep memory sharp.    I have personally reviewed and noted the following in the patient's chart:   . Medical and social history . Use of alcohol, tobacco or illicit drugs  . Current medications and supplements . Functional ability and status . Nutritional status . Physical activity . Advanced directives . List of other physicians . Hospitalizations, surgeries, and ER visits in previous 12 months . Vitals . Screenings to include cognitive,  depression, and falls . Referrals and appointments  In addition, I have reviewed and discussed with patient certain preventive protocols, quality metrics, and best practice recommendations. A written personalized care plan for preventive services as well as general preventive health recommendations were provided to patient.     Naaman Plummer [place], [place]  12/28/2016  Kathlene November, MD

## 2016-12-23 NOTE — Progress Notes (Signed)
Patient is approximately 5 weeks status post left total knee replacement. She is doing well and not complaining planing of any significant pain. She is doing outpatient physical therapy and progressing well.  Physical exam shows a healed surgical scar. Her range of motion is progressing and improving very well. No signs of infection. X-ray show stable total knee replacement.  Patient is doing well from my standpoint. 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 Work note was provided today. Follow-up in 7 weeks for her 3 month recheck.

## 2016-12-26 DIAGNOSIS — M25662 Stiffness of left knee, not elsewhere classified: Secondary | ICD-10-CM | POA: Diagnosis not present

## 2016-12-26 DIAGNOSIS — R262 Difficulty in walking, not elsewhere classified: Secondary | ICD-10-CM | POA: Diagnosis not present

## 2016-12-26 DIAGNOSIS — M25562 Pain in left knee: Secondary | ICD-10-CM | POA: Diagnosis not present

## 2016-12-26 DIAGNOSIS — M6281 Muscle weakness (generalized): Secondary | ICD-10-CM | POA: Diagnosis not present

## 2016-12-28 ENCOUNTER — Ambulatory Visit (INDEPENDENT_AMBULATORY_CARE_PROVIDER_SITE_OTHER): Payer: Medicare HMO | Admitting: Internal Medicine

## 2016-12-28 ENCOUNTER — Encounter: Payer: Self-pay | Admitting: Internal Medicine

## 2016-12-28 VITALS — BP 132/68 | HR 85 | Temp 98.0°F | Resp 14 | Ht 63.0 in | Wt 253.4 lb

## 2016-12-28 DIAGNOSIS — I10 Essential (primary) hypertension: Secondary | ICD-10-CM | POA: Diagnosis not present

## 2016-12-28 DIAGNOSIS — E785 Hyperlipidemia, unspecified: Secondary | ICD-10-CM

## 2016-12-28 DIAGNOSIS — Z0000 Encounter for general adult medical examination without abnormal findings: Secondary | ICD-10-CM | POA: Diagnosis not present

## 2016-12-28 DIAGNOSIS — E039 Hypothyroidism, unspecified: Secondary | ICD-10-CM

## 2016-12-28 DIAGNOSIS — G4700 Insomnia, unspecified: Secondary | ICD-10-CM | POA: Diagnosis not present

## 2016-12-28 LAB — TSH: TSH: 2.16 u[IU]/mL (ref 0.35–4.50)

## 2016-12-28 NOTE — Progress Notes (Signed)
Subjective:    Patient ID: Kelly Perkins, female    DOB: December 03, 1950, 66 y.o.   MRN: [id number]  DOS:  12/28/2016 Type of visit - description : rov  Interval history: Since the last visit, had a  left TKR. The surgery went well, she is almost done with post surgical PT, feeling great. Good compliance with routine medications. Pain  management with meloxicam and Flexeril as needed   Review of Systems No nausea, vomiting, diarrhea or abdominal pain. Denies chest pain or difficulty breathing  Past Medical History:  Diagnosis Date  . GERD (gastroesophageal reflux disease)   . History of kidney stones   . History of lithotripsy    urolthiasis 2004  . Hyperlipidemia 09/10/2010  . Hypertension    no meds  . Hypothyroidism   . Kidney stones   . Normal cardiac stress test 6-12   had CP-DOE  . Osteoarthritis   . Wears dentures    top  . Wears glasses     Past Surgical History:  Procedure Laterality Date  . COLONOSCOPY     4098,1191  . hysterectomy (unknown)  2000   B oophoreectomy, d/t DUB  . MASS EXCISION  09/12/2011   Procedure: EXCISION MASS;  Surgeon: Cristine Polio, MD;  Location: [place];  Service: Plastics;  Laterality: Left;  left ring finger  . POLYPECTOMY     TA 2004  . THYROIDECTOMY    . TOTAL KNEE ARTHROPLASTY Left 11/17/2016   Procedure: LEFT TOTAL KNEE ARTHROPLASTY;  Surgeon: Leandrew Koyanagi, MD;  Location: [place];  Service: Orthopedics;  Laterality: Left;    Social History   Social History  . Marital status: Single    Spouse name: N/A  . Number of children: 2  . Years of education: N/A   Occupational History  . 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 Smoking status: Never Smoker  . Smokeless tobacco: Never Used  . Alcohol use 0.0 oz/week     Comment: socially  . Drug use: No  . Sexual activity: No   Other Topics Concern  . Not on file   Social History Narrative   Single, mom is one of my patients Ms.  Kelly Perkins.           Allergies as of 12/28/2016   No Known Allergies     Medication List       Accurate as of 12/28/16  5:46 PM. Always use your most recent med list.          aspirin EC 325 MG tablet Take 1 tablet (325 mg total) by mouth 2 (two) times daily.   levothyroxine 150 MCG tablet Commonly known as:  SYNTHROID, LEVOTHROID Take 1 tablet (150 mcg total) by mouth daily before breakfast.   lisinopril-hydrochlorothiazide 10-12.5 MG tablet Commonly known as:  PRINZIDE,ZESTORETIC Take 1 tablet by mouth daily.   meloxicam 15 MG tablet Commonly known as:  MOBIC TAKE 1 TABLET BY MOUTH ONCE DAILY AS NEEDED.   methocarbamol 750 MG tablet Commonly known as:  ROBAXIN Take 1 tablet (750 mg total) by mouth 2 (two) times daily as needed for muscle spasms.   pantoprazole 40 MG tablet Commonly known as:  PROTONIX Take 1 tablet (40 mg total) by mouth daily.   simvastatin 20 MG tablet Commonly known as:  ZOCOR Take 1 tablet (20 mg total) by mouth at bedtime.   zolpidem 10 MG tablet Commonly known as:  AMBIEN  Take 1 tablet (10 mg total) by mouth at bedtime.          Objective:   Physical Exam BP 132/68 (BP Location: Left Arm, Patient Position: Sitting, Cuff Size: Normal)   Pulse 85   Temp 98 F (36.7 C) (Oral)   Resp 14   Ht 5\' 3"  (1.6 m)   Wt 253 lb 6 oz (114.9 kg)   SpO2 96%   BMI 44.88 kg/m  General:   Well developed, well nourished . NAD.  HEENT:  Normocephalic . Face symmetric, atraumatic Lungs:  CTA B Normal respiratory effort, no intercostal retractions, no accessory muscle use. Heart: RRR,  no murmur.  Minimal pretibial edema bilaterally, slightly more noticeable on the left Skin: Not pale. Not jaundice Neurologic:  alert & oriented X3.  Speech normal, gait appropriate for age and post surgical state Psych--  Cognition and judgment appear intact.  Cooperative with normal attention span and concentration.  Behavior appropriate. No anxious or  depressed appearing.      Assessment & Plan:   Assessment HTN Hyperlipidemia Insomnia-- ambien Hypothyroidism, s/p thyroidectomy GERD Mild anemia, normal iron 12-2013, cscope 08-2015 DJD h/o kidney stones  CP-DOE: Normal stress test 08/2010  PLAN:  HTN: Seems well-controlled on Zestoretic, BMP after TKR was normal. No change. Hyperlipidemia: Refill simvastatin Hypothyroidism: Check a TSH, refill Synthroid Insomnia: Due for Ambien refill December, will RF then DJD: Recovering very well from surgery. On  meloxicam/Flexeril as needed RTC 06-2015, CPX

## 2016-12-28 NOTE — Assessment & Plan Note (Signed)
HTN: Seems well-controlled on Zestoretic, BMP after TKR was normal. No change. Hyperlipidemia: Refill simvastatin Hypothyroidism: Check a TSH, refill Synthroid Insomnia: Due for Ambien refill December, will RF then DJD: Recovering very well from surgery. On  meloxicam/Flexeril as needed RTC 06-2015, CPX

## 2016-12-28 NOTE — Patient Instructions (Addendum)
GO TO THE LAB : Get the blood work     GO TO THE FRONT DESK Schedule your next appointment for a  physical exam by April 2019    Follow up with Dr.Davin Muramoto as directed.  Continue to eat heart healthy diet (full of fruits, vegetables, whole grains, lean protein, water--limit salt, fat, and sugar intake) and increase physical activity as tolerated.  Continue doing brain stimulating activities (puzzles, reading, adult coloring books, staying active) to keep memory sharp.    [name] , Thank you for taking time to come for your Medicare Wellness Visit. I appreciate your ongoing commitment to your health goals. Please review the following plan we discussed and let me know if I can assist you in the future.   These are the goals we discussed: Goals      Patient Stated   . Weight (lb) < 225 lb (102.1 kg) (pt-stated)          Return to gym and eat healthier.       This is a list of the screening recommended for you and due dates:  Health Maintenance  Topic Date Due  . Tetanus Vaccine  04/12/2017  . Mammogram  06/16/2017  . Colon Cancer Screening  09/02/2020  . Flu Shot  Completed  . DEXA scan (bone density measurement)  Completed  .  Hepatitis C: One time screening is recommended by Center for Disease Control  (CDC) for  adults born from [birth year] through 1965.   Completed  . Pneumonia vaccines  Completed    Health Maintenance for Postmenopausal Women Menopause is a normal process in which your reproductive ability comes to an end. This process happens gradually over a span of months to years, usually between the ages of 77 and 16. Menopause is complete when you have missed 12 consecutive menstrual periods. It is important to talk with your health care provider about some of the most common conditions that affect postmenopausal women, such as heart disease, cancer, and bone loss (osteoporosis). Adopting a healthy lifestyle and getting preventive care can help to promote your health and  wellness. Those actions can also lower your chances of developing some of these common conditions. What should I know about menopause? During menopause, you may experience a number of symptoms, such as:  Moderate-to-severe hot flashes.  Night sweats.  Decrease in sex drive.  Mood swings.  Headaches.  Tiredness.  Irritability.  Memory problems.  Insomnia.  Choosing to treat or not to treat menopausal changes is an individual decision that you make with your health care provider. What should I know about hormone replacement therapy and supplements? Hormone therapy products are effective for treating symptoms that are associated with menopause, such as hot flashes and night sweats. Hormone replacement carries certain risks, especially as you become older. If you are thinking about using estrogen or estrogen with progestin treatments, discuss the benefits and risks with your health care provider. What should I know about heart disease and stroke? Heart disease, heart attack, and stroke become more likely as you age. This may be due, in part, to the hormonal changes that your body experiences during menopause. These can affect how your body processes dietary fats, triglycerides, and cholesterol. Heart attack and stroke are both medical emergencies. There are many things that you can do to help prevent heart disease and stroke:  Have your blood pressure checked at least every 1-2 years. High blood pressure causes heart disease and increases the risk of stroke.  If you are 89-65 years old, ask your health care provider if you should take aspirin to prevent a heart attack or a stroke.  Do not use any tobacco products, including cigarettes, chewing tobacco, or electronic cigarettes. If you need help quitting, ask your health care provider.  It is important to eat a healthy diet and maintain a healthy weight. ? Be sure to include plenty of vegetables, fruits, low-fat dairy products, and  lean protein. ? Avoid eating foods that are high in solid fats, added sugars, or salt (sodium).  Get regular exercise. This is one of the most important things that you can do for your health. ? Try to exercise for at least 150 minutes each week. The type of exercise that you do should increase your heart rate and make you sweat. This is known as moderate-intensity exercise. ? Try to do strengthening exercises at least twice each week. Do these in addition to the moderate-intensity exercise.  Know your numbers.Ask your health care provider to check your cholesterol and your blood glucose. Continue to have your blood tested as directed by your health care provider.  What should I know about cancer screening? There are several types of cancer. Take the following steps to reduce your risk and to catch any cancer development as early as possible. Breast Cancer  Practice breast self-awareness. ? This means understanding how your breasts normally appear and feel. ? It also means doing regular breast self-exams. Let your health care provider know about any changes, no matter how small.  If you are 64 or older, have a clinician do a breast exam (clinical breast exam or CBE) every year. Depending on your age, family history, and medical history, it may be recommended that you also have a yearly breast X-ray (mammogram).  If you have a family history of breast cancer, talk with your health care provider about genetic screening.  If you are at high risk for breast cancer, talk with your health care provider about having an MRI and a mammogram every year.  Breast cancer (BRCA) gene test is recommended for women who have family members with BRCA-related cancers. Results of the assessment will determine the need for genetic counseling and BRCA1 and for BRCA2 testing. BRCA-related cancers include these types: ? Breast. This occurs in males or females. ? Ovarian. ? Tubal. This may also be called fallopian  tube cancer. ? Cancer of the abdominal or pelvic lining (peritoneal cancer). ? Prostate. ? Pancreatic.  Cervical, Uterine, and Ovarian Cancer Your health care provider may recommend that you be screened regularly for cancer of the pelvic organs. These include your ovaries, uterus, and vagina. This screening involves a pelvic exam, which includes checking for microscopic changes to the surface of your cervix (Pap test).  For women ages 21-65, health care providers may recommend a pelvic exam and a Pap test every three years. For women ages 26-65, they may recommend the Pap test and pelvic exam, combined with testing for human papilloma virus (HPV), every five years. Some types of HPV increase your risk of cervical cancer. Testing for HPV may also be done on women of any age who have unclear Pap test results.  Other health care providers may not recommend any screening for nonpregnant women who are considered low risk for pelvic cancer and have no symptoms. Ask your health care provider if a screening pelvic exam is right for you.  If you have had past treatment for cervical cancer or a condition that could  lead to cancer, you need Pap tests and screening for cancer for at least 20 years after your treatment. If Pap tests have been discontinued for you, your risk factors (such as having a new sexual partner) need to be reassessed to determine if you should start having screenings again. Some women have medical problems that increase the chance of getting cervical cancer. In these cases, your health care provider may recommend that you have screening and Pap tests more often.  If you have a family history of uterine cancer or ovarian cancer, talk with your health care provider about genetic screening.  If you have vaginal bleeding after reaching menopause, tell your health care provider.  There are currently no reliable tests available to screen for ovarian cancer.  Lung Cancer Lung cancer  screening is recommended for adults 72-58 years old who are at high risk for lung cancer because of a history of smoking. A yearly low-dose CT scan of the lungs is recommended if you:  Currently smoke.  Have a history of at least 30 pack-years of smoking and you currently smoke or have quit within the past 15 years. A pack-year is smoking an average of one pack of cigarettes per day for one year.  Yearly screening should:  Continue until it has been 15 years since you quit.  Stop if you develop a health problem that would prevent you from having lung cancer treatment.  Colorectal Cancer  This type of cancer can be detected and can often be prevented.  Routine colorectal cancer screening usually begins at age 81 and continues through age 71.  If you have risk factors for colon cancer, your health care provider may recommend that you be screened at an earlier age.  If you have a family history of colorectal cancer, talk with your health care provider about genetic screening.  Your health care provider may also recommend using home test kits to check for hidden blood in your stool.  A small camera at the end of a tube can be used to examine your colon directly (sigmoidoscopy or colonoscopy). This is done to check for the earliest forms of colorectal cancer.  Direct examination of the colon should be repeated every 5-10 years until age 2. However, if early forms of precancerous polyps or small growths are found or if you have a family history or genetic risk for colorectal cancer, you may need to be screened more often.  Skin Cancer  Check your skin from head to toe regularly.  Monitor any moles. Be sure to tell your health care provider: ? About any new moles or changes in moles, especially if there is a change in a mole's shape or color. ? If you have a mole that is larger than the size of a pencil eraser.  If any of your family members has a history of skin cancer, especially at a  young age, talk with your health care provider about genetic screening.  Always use sunscreen. Apply sunscreen liberally and repeatedly throughout the day.  Whenever you are outside, protect yourself by wearing long sleeves, pants, a wide-brimmed hat, and sunglasses.  What should I know about osteoporosis? Osteoporosis is a condition in which bone destruction happens more quickly than new bone creation. After menopause, you may be at an increased risk for osteoporosis. To help prevent osteoporosis or the bone fractures that can happen because of osteoporosis, the following is recommended:  If you are 54-23 years old, get at least 1,000 mg of  calcium and at least 600 mg of vitamin D per day.  If you are older than age 69 but younger than age 30, get at least 1,200 mg of calcium and at least 600 mg of vitamin D per day.  If you are older than age 51, get at least 1,200 mg of calcium and at least 800 mg of vitamin D per day.  Smoking and excessive alcohol intake increase the risk of osteoporosis. Eat foods that are rich in calcium and vitamin D, and do weight-bearing exercises several times each week as directed by your health care provider. What should I know about how menopause affects my mental health? Depression may occur at any age, but it is more common as you become older. Common symptoms of depression include:  Low or sad mood.  Changes in sleep patterns.  Changes in appetite or eating patterns.  Feeling an overall lack of motivation or enjoyment of activities that you previously enjoyed.  Frequent crying spells.  Talk with your health care provider if you think that you are experiencing depression. What should I know about immunizations? It is important that you get and maintain your immunizations. These include:  Tetanus, diphtheria, and pertussis (Tdap) booster vaccine.  Influenza every year before the flu season begins.  Pneumonia vaccine.  Shingles vaccine.  Your  health care provider may also recommend other immunizations. This information is not intended to replace advice given to you by your health care provider. Make sure you discuss any questions you have with your health care provider. Document Released: 04/22/2005 Document Revised: 09/18/2015 Document Reviewed: 12/02/2014 Elsevier Interactive Patient Education  2018 [employer].

## 2016-12-29 ENCOUNTER — Other Ambulatory Visit (INDEPENDENT_AMBULATORY_CARE_PROVIDER_SITE_OTHER): Payer: Self-pay | Admitting: Orthopaedic Surgery

## 2016-12-29 DIAGNOSIS — M6281 Muscle weakness (generalized): Secondary | ICD-10-CM | POA: Diagnosis not present

## 2016-12-29 DIAGNOSIS — M25562 Pain in left knee: Secondary | ICD-10-CM | POA: Diagnosis not present

## 2016-12-29 DIAGNOSIS — R262 Difficulty in walking, not elsewhere classified: Secondary | ICD-10-CM | POA: Diagnosis not present

## 2016-12-29 DIAGNOSIS — M25662 Stiffness of left knee, not elsewhere classified: Secondary | ICD-10-CM | POA: Diagnosis not present

## 2016-12-29 NOTE — Telephone Encounter (Signed)
She may discontinue aspirin now

## 2016-12-30 ENCOUNTER — Ambulatory Visit (INDEPENDENT_AMBULATORY_CARE_PROVIDER_SITE_OTHER): Payer: Medicare HMO | Admitting: Orthopaedic Surgery

## 2016-12-30 DIAGNOSIS — M25662 Stiffness of left knee, not elsewhere classified: Secondary | ICD-10-CM | POA: Diagnosis not present

## 2016-12-30 DIAGNOSIS — M6281 Muscle weakness (generalized): Secondary | ICD-10-CM | POA: Diagnosis not present

## 2016-12-30 DIAGNOSIS — R262 Difficulty in walking, not elsewhere classified: Secondary | ICD-10-CM | POA: Diagnosis not present

## 2016-12-30 DIAGNOSIS — M25562 Pain in left knee: Secondary | ICD-10-CM | POA: Diagnosis not present

## 2017-01-04 DIAGNOSIS — R262 Difficulty in walking, not elsewhere classified: Secondary | ICD-10-CM | POA: Diagnosis not present

## 2017-01-04 DIAGNOSIS — M6281 Muscle weakness (generalized): Secondary | ICD-10-CM | POA: Diagnosis not present

## 2017-01-04 DIAGNOSIS — M25562 Pain in left knee: Secondary | ICD-10-CM | POA: Diagnosis not present

## 2017-01-04 DIAGNOSIS — M25662 Stiffness of left knee, not elsewhere classified: Secondary | ICD-10-CM | POA: Diagnosis not present

## 2017-01-05 DIAGNOSIS — R262 Difficulty in walking, not elsewhere classified: Secondary | ICD-10-CM | POA: Diagnosis not present

## 2017-01-05 DIAGNOSIS — M6281 Muscle weakness (generalized): Secondary | ICD-10-CM | POA: Diagnosis not present

## 2017-01-05 DIAGNOSIS — M25662 Stiffness of left knee, not elsewhere classified: Secondary | ICD-10-CM | POA: Diagnosis not present

## 2017-01-05 DIAGNOSIS — M25562 Pain in left knee: Secondary | ICD-10-CM | POA: Diagnosis not present

## 2017-01-08 ENCOUNTER — Other Ambulatory Visit: Payer: Self-pay | Admitting: Internal Medicine

## 2017-01-10 DIAGNOSIS — R262 Difficulty in walking, not elsewhere classified: Secondary | ICD-10-CM | POA: Diagnosis not present

## 2017-01-10 DIAGNOSIS — M6281 Muscle weakness (generalized): Secondary | ICD-10-CM | POA: Diagnosis not present

## 2017-01-10 DIAGNOSIS — M25662 Stiffness of left knee, not elsewhere classified: Secondary | ICD-10-CM | POA: Diagnosis not present

## 2017-01-10 DIAGNOSIS — M25562 Pain in left knee: Secondary | ICD-10-CM | POA: Diagnosis not present

## 2017-01-12 DIAGNOSIS — M6281 Muscle weakness (generalized): Secondary | ICD-10-CM | POA: Diagnosis not present

## 2017-01-12 DIAGNOSIS — M25662 Stiffness of left knee, not elsewhere classified: Secondary | ICD-10-CM | POA: Diagnosis not present

## 2017-01-12 DIAGNOSIS — R262 Difficulty in walking, not elsewhere classified: Secondary | ICD-10-CM | POA: Diagnosis not present

## 2017-01-12 DIAGNOSIS — M25562 Pain in left knee: Secondary | ICD-10-CM | POA: Diagnosis not present

## 2017-01-16 ENCOUNTER — Other Ambulatory Visit (INDEPENDENT_AMBULATORY_CARE_PROVIDER_SITE_OTHER): Payer: Self-pay | Admitting: Orthopaedic Surgery

## 2017-01-23 ENCOUNTER — Other Ambulatory Visit (INDEPENDENT_AMBULATORY_CARE_PROVIDER_SITE_OTHER): Payer: Self-pay | Admitting: Orthopaedic Surgery

## 2017-01-24 NOTE — Telephone Encounter (Signed)
yes

## 2017-01-25 NOTE — Telephone Encounter (Signed)
CALLED RX INTO PHARM  

## 2017-02-08 ENCOUNTER — Other Ambulatory Visit: Payer: Self-pay | Admitting: Internal Medicine

## 2017-02-09 NOTE — Telephone Encounter (Signed)
Pt requesting refill on flexeril 10mg . No longer on med list. Methocarbamol (Robaxin) is on med list. Please advise.

## 2017-02-09 NOTE — Telephone Encounter (Signed)
LMOM asking for Pt to call back in regards to why she needs flexeril. Okay for PEC to discuss.

## 2017-02-09 NOTE — Telephone Encounter (Signed)
Please ask patient what does she need Flexeril for?  In the past I prescribed it for leg cramps.

## 2017-02-10 ENCOUNTER — Encounter (INDEPENDENT_AMBULATORY_CARE_PROVIDER_SITE_OTHER): Payer: Self-pay | Admitting: Orthopaedic Surgery

## 2017-02-10 ENCOUNTER — Ambulatory Visit (INDEPENDENT_AMBULATORY_CARE_PROVIDER_SITE_OTHER): Payer: Medicare HMO | Admitting: Orthopaedic Surgery

## 2017-02-10 DIAGNOSIS — Z96652 Presence of left artificial knee joint: Secondary | ICD-10-CM

## 2017-02-10 DIAGNOSIS — M1711 Unilateral primary osteoarthritis, right knee: Secondary | ICD-10-CM

## 2017-02-10 MED ORDER — LIDOCAINE HCL 1 % IJ SOLN
2.0000 mL | INTRAMUSCULAR | Status: AC | PRN
  Administered 2017-02-10: 2 mL

## 2017-02-10 MED ORDER — METHYLPREDNISOLONE ACETATE 40 MG/ML IJ SUSP
40.0000 mg | INTRAMUSCULAR | Status: AC | PRN
  Administered 2017-02-10: 40 mg via INTRA_ARTICULAR

## 2017-02-10 MED ORDER — BUPIVACAINE HCL 0.5 % IJ SOLN
2.0000 mL | INTRAMUSCULAR | Status: AC | PRN
Start: 2017-02-10 — End: 2017-02-10
  Administered 2017-02-10: 2 mL via INTRA_ARTICULAR

## 2017-02-10 NOTE — Telephone Encounter (Signed)
Pt no longer using Robaxin. Rx sent.

## 2017-02-10 NOTE — Progress Notes (Signed)
   Procedure Note  Patient: Kelly Perkins             Date of Birth: 04/29/1950           MRN: [id number]             Visit Date: 02/10/2017  Procedures: Visit Diagnoses: Primary osteoarthritis of right knee  Status post total left knee replacement  Large Joint Inj: R knee on 02/10/2017 11:26 AM Indications: pain Details: 22 G needle  Arthrogram: No  Medications: 40 mg methylPREDNISolone acetate 40 MG/ML; 2 mL lidocaine 1 %; 2 mL bupivacaine 0.5 % Consent was given by the patient. Patient was prepped and draped in the usual sterile fashion.

## 2017-02-10 NOTE — Progress Notes (Signed)
Patient ID: Kelly Perkins, female   DOB: Dec 23, 1950, 66 y.o.   MRN: [id number]  Patient is 3 months status post left total knee replacement and doing well.  She is ready to go back to work on December 6.  She is very happy with her progress and glad she had the surgery done.  She is requesting a right knee cortisone injection for her degenerative joint disease today.

## 2017-02-10 NOTE — Telephone Encounter (Signed)
Notes   Kelly Perkins 02/10/2017 02:21 PM  Patient said that she need the med cyclobenzaprine (FLEXERIL) refilled for her leg because they are cramping.   Please advise.

## 2017-02-10 NOTE — Telephone Encounter (Signed)
She has been on Flexeril before but I see that now she has Robaxin which is also a muscle relaxant.  She needs to take one or the other. If she decides to  stop Robaxin then okay to refill Flexeril, #90, 1 refill

## 2017-02-19 ENCOUNTER — Other Ambulatory Visit: Payer: Self-pay | Admitting: Internal Medicine

## 2017-02-20 ENCOUNTER — Other Ambulatory Visit: Payer: Self-pay | Admitting: Internal Medicine

## 2017-04-23 ENCOUNTER — Other Ambulatory Visit (INDEPENDENT_AMBULATORY_CARE_PROVIDER_SITE_OTHER): Payer: Self-pay | Admitting: Orthopaedic Surgery

## 2017-04-24 ENCOUNTER — Telehealth: Payer: Self-pay

## 2017-04-24 NOTE — Telephone Encounter (Signed)
PA Case: [id number], Status: Approved, Coverage Starts on: 04/24/2017 12:00:00 AM, Coverage Ends on: 04/24/2019 12:00:00 AM. Questions? Contact [phone].

## 2017-04-24 NOTE — Telephone Encounter (Signed)
PA initiated via Covermymeds; KEY: [id number]. Awaiting determination.

## 2017-04-24 NOTE — Telephone Encounter (Signed)
approve

## 2017-04-24 NOTE — Telephone Encounter (Signed)
yes

## 2017-05-08 ENCOUNTER — Other Ambulatory Visit: Payer: Self-pay

## 2017-05-08 MED ORDER — SIMVASTATIN 20 MG PO TABS: 20.0000 mg | ORAL_TABLET | Freq: Every day | ORAL | 1 refills | Status: DC

## 2017-05-11 ENCOUNTER — Ambulatory Visit (INDEPENDENT_AMBULATORY_CARE_PROVIDER_SITE_OTHER): Payer: Medicare HMO | Admitting: Orthopaedic Surgery

## 2017-05-11 ENCOUNTER — Encounter (INDEPENDENT_AMBULATORY_CARE_PROVIDER_SITE_OTHER): Payer: Self-pay | Admitting: Orthopaedic Surgery

## 2017-05-11 DIAGNOSIS — M1711 Unilateral primary osteoarthritis, right knee: Secondary | ICD-10-CM | POA: Diagnosis not present

## 2017-05-11 DIAGNOSIS — Z96652 Presence of left artificial knee joint: Secondary | ICD-10-CM

## 2017-05-11 MED ORDER — BUPIVACAINE HCL 0.5 % IJ SOLN
2.0000 mL | INTRAMUSCULAR | Status: AC | PRN
  Administered 2017-05-11: 2 mL via INTRA_ARTICULAR

## 2017-05-11 MED ORDER — LIDOCAINE HCL 1 % IJ SOLN
2.0000 mL | INTRAMUSCULAR | Status: AC | PRN
  Administered 2017-05-11: 2 mL

## 2017-05-11 MED ORDER — MELOXICAM 7.5 MG PO TABS: 7.5000 mg | ORAL_TABLET | Freq: Two times a day (BID) | ORAL | 2 refills | Status: DC | PRN

## 2017-05-11 MED ORDER — DICLOFENAC SODIUM 1 % TD GEL: 2.0000 g | Freq: Four times a day (QID) | TRANSDERMAL | 5 refills | Status: DC

## 2017-05-11 MED ORDER — METHYLPREDNISOLONE ACETATE 40 MG/ML IJ SUSP
40.0000 mg | INTRAMUSCULAR | Status: AC | PRN
  Administered 2017-05-11: 40 mg via INTRA_ARTICULAR

## 2017-05-11 NOTE — Progress Notes (Signed)
Office Visit Note   Patient: Kelly Perkins           Date of Birth: 1950/09/09           MRN: [id number] Visit Date: 05/11/2017              Requested by: Colon Branch, [address] [place], [place] [address] PCP: Colon Branch, MD   Assessment & Plan: Visit Diagnoses:  1. Primary osteoarthritis of right knee   2. Status post total left knee replacement     Plan: From her left knee replacement standpoint she is doing very well.  I  reminded her of dental prophylaxis.  For her right knee we provided where with a another cortisone injection which she tolerated well.  Prescription for meloxicam and Voltaren gel.  Follow-up in 6 months for her one-year visit for her left total knee replacement.  She will call us when she is ready for her right knee replacement  Follow-Up Instructions: Return in about 6 months (around 11/08/2017).   Orders:  No orders of the defined types were placed in this encounter.  Meds ordered this encounter  Medications  . diclofenac sodium (VOLTAREN) 1 % GEL    Sig: Apply 2 g topically 4 (four) times daily.    Dispense:  1 Tube    Refill:  5  . meloxicam (MOBIC) 7.5 MG tablet    Sig: Take 1 tablet (7.5 mg total) by mouth 2 (two) times daily as needed for pain.    Dispense:  30 tablet    Refill:  2      Procedures: Large Joint Inj: R knee on 05/11/2017 5:02 PM Indications: pain Details: 22 G needle  Arthrogram: No  Medications: 40 mg methylPREDNISolone acetate 40 MG/ML; 2 mL lidocaine 1 %; 2 mL bupivacaine 0.5 % Consent was given by the patient. Patient was prepped and draped in the usual sterile fashion.       Clinical Data: No additional findings.   Subjective: Chief Complaint  Patient presents with  . Left Knee - Follow-up  . Right Knee - Pain    Patient is 6 months status post left total knee replacement.  She is doing very well and very happy.  She has resumed normal daily activities.  She is requesting a cortisone  injection for her right knee.  She is likely going to schedule her right knee replacement sometime after the summer.    Review of Systems  Constitutional: Negative.   HENT: Negative.   Eyes: Negative.   Respiratory: Negative.   Cardiovascular: Negative.   Endocrine: Negative.   Musculoskeletal: Negative.   Neurological: Negative.   Hematological: Negative.   Psychiatric/Behavioral: Negative.   All other systems reviewed and are negative.    Objective: Vital Signs: There were no vitals taken for this visit.  Physical Exam  Constitutional: She is oriented to person, place, and time. She appears well-developed and well-nourished.  Pulmonary/Chest: Effort normal.  Neurological: She is alert and oriented to person, place, and time.  Skin: Skin is warm. Capillary refill takes less than 2 seconds.  Psychiatric: She has a normal mood and affect. Her behavior is normal. Judgment and thought content normal.  Nursing note and vitals reviewed.   Ortho Exam Left knee exam shows a fully healed surgical scar.  She has excellent range of motion. Right knee exam shows no joint effusion.  Collaterals and cruciates are stable. Specialty Comments:  No specialty  comments available.  Imaging: No results found.   PMFS History: Patient Active Problem List   Diagnosis Date Noted  . Total knee replacement status 11/17/2016  . PCP NOTES >>>>>>>>>>> 06/22/2015  . Annual physical exam 06/09/2014  . GERD (gastroesophageal reflux disease) 06/09/2014  . Hyperlipidemia 09/10/2010  . Anemia 08/11/2010  . HTN (hypertension) 02/27/2008  . ECHOCARDIOGRAM, ABNORMAL 02/27/2008  . Hypothyroidism 04/13/2007  . INSOMNIA, CHRONIC 04/13/2007  . Osteoarthritis 04/13/2007   Past Medical History:  Diagnosis Date  . GERD (gastroesophageal reflux disease)   . History of kidney stones   . History of lithotripsy    urolthiasis 2004  . Hyperlipidemia 09/10/2010  . Hypertension    no meds  .  Hypothyroidism   . Kidney stones   . Normal cardiac stress test 6-12   had CP-DOE  . Osteoarthritis   . Wears dentures    top  . Wears glasses     Family History  Problem Relation Age of Onset  . Hypertension Mother   . Coronary artery disease Mother        onset?  Marland Kitchen Hypertension Sister   . Colon polyps Other        niece  . Diabetes Neg Hx   . Colon cancer Neg Hx   . Breast cancer Neg Hx   . Stroke Neg Hx   . Rectal cancer Neg Hx   . Stomach cancer Neg Hx     Past Surgical History:  Procedure Laterality Date  . COLONOSCOPY     [id number]  . hysterectomy (unknown)  2000   B oophoreectomy, d/t DUB  . MASS EXCISION  09/12/2011   Procedure: EXCISION MASS;  Surgeon: Cristine Polio, MD;  Location: [facility];  Service: Plastics;  Laterality: Left;  left ring finger  . POLYPECTOMY     TA 2004  . THYROIDECTOMY    . TOTAL KNEE ARTHROPLASTY Left 11/17/2016   Procedure: LEFT TOTAL KNEE ARTHROPLASTY;  Surgeon: Leandrew Koyanagi, MD;  Location: [facility];  Service: Orthopedics;  Laterality: Left;   Social History   Occupational History  . 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 Smoking status: Never Smoker  . Smokeless tobacco: Never Used  Substance and Sexual Activity  . Alcohol use: Yes    Alcohol/week: 0.0 oz    Comment: socially  . Drug use: No  . Sexual activity: No

## 2017-05-14 DIAGNOSIS — E785 Hyperlipidemia, unspecified: Secondary | ICD-10-CM | POA: Diagnosis not present

## 2017-05-14 DIAGNOSIS — E039 Hypothyroidism, unspecified: Secondary | ICD-10-CM | POA: Diagnosis not present

## 2017-05-14 DIAGNOSIS — J101 Influenza due to other identified influenza virus with other respiratory manifestations: Secondary | ICD-10-CM | POA: Diagnosis not present

## 2017-05-14 DIAGNOSIS — I10 Essential (primary) hypertension: Secondary | ICD-10-CM | POA: Diagnosis not present

## 2017-05-14 DIAGNOSIS — J9601 Acute respiratory failure with hypoxia: Secondary | ICD-10-CM | POA: Diagnosis not present

## 2017-05-14 DIAGNOSIS — R000 Tachycardia, unspecified: Secondary | ICD-10-CM | POA: Diagnosis not present

## 2017-05-14 DIAGNOSIS — K219 Gastro-esophageal reflux disease without esophagitis: Secondary | ICD-10-CM | POA: Diagnosis not present

## 2017-05-14 DIAGNOSIS — R0902 Hypoxemia: Secondary | ICD-10-CM | POA: Diagnosis not present

## 2017-05-14 DIAGNOSIS — J09X2 Influenza due to identified novel influenza A virus with other respiratory manifestations: Secondary | ICD-10-CM | POA: Diagnosis not present

## 2017-05-14 DIAGNOSIS — Z79899 Other long term (current) drug therapy: Secondary | ICD-10-CM | POA: Diagnosis not present

## 2017-05-14 DIAGNOSIS — R05 Cough: Secondary | ICD-10-CM | POA: Diagnosis not present

## 2017-05-14 DIAGNOSIS — I517 Cardiomegaly: Secondary | ICD-10-CM | POA: Diagnosis not present

## 2017-05-15 DIAGNOSIS — J9601 Acute respiratory failure with hypoxia: Secondary | ICD-10-CM | POA: Diagnosis not present

## 2017-05-15 DIAGNOSIS — K219 Gastro-esophageal reflux disease without esophagitis: Secondary | ICD-10-CM | POA: Diagnosis not present

## 2017-05-15 DIAGNOSIS — J101 Influenza due to other identified influenza virus with other respiratory manifestations: Secondary | ICD-10-CM | POA: Diagnosis not present

## 2017-05-15 DIAGNOSIS — E785 Hyperlipidemia, unspecified: Secondary | ICD-10-CM | POA: Diagnosis not present

## 2017-05-15 DIAGNOSIS — E039 Hypothyroidism, unspecified: Secondary | ICD-10-CM | POA: Diagnosis not present

## 2017-05-15 DIAGNOSIS — I10 Essential (primary) hypertension: Secondary | ICD-10-CM | POA: Diagnosis not present

## 2017-05-16 ENCOUNTER — Telehealth: Payer: Self-pay | Admitting: Internal Medicine

## 2017-05-16 DIAGNOSIS — E039 Hypothyroidism, unspecified: Secondary | ICD-10-CM | POA: Diagnosis not present

## 2017-05-16 DIAGNOSIS — R0602 Shortness of breath: Secondary | ICD-10-CM | POA: Diagnosis not present

## 2017-05-16 DIAGNOSIS — I517 Cardiomegaly: Secondary | ICD-10-CM | POA: Diagnosis not present

## 2017-05-16 DIAGNOSIS — J101 Influenza due to other identified influenza virus with other respiratory manifestations: Secondary | ICD-10-CM | POA: Diagnosis not present

## 2017-05-16 DIAGNOSIS — J9601 Acute respiratory failure with hypoxia: Secondary | ICD-10-CM | POA: Diagnosis not present

## 2017-05-16 DIAGNOSIS — I2721 Secondary pulmonary arterial hypertension: Secondary | ICD-10-CM | POA: Diagnosis not present

## 2017-05-16 DIAGNOSIS — I10 Essential (primary) hypertension: Secondary | ICD-10-CM | POA: Diagnosis not present

## 2017-05-16 DIAGNOSIS — K219 Gastro-esophageal reflux disease without esophagitis: Secondary | ICD-10-CM | POA: Diagnosis not present

## 2017-05-16 DIAGNOSIS — E785 Hyperlipidemia, unspecified: Secondary | ICD-10-CM | POA: Diagnosis not present

## 2017-05-16 NOTE — Telephone Encounter (Signed)
Nurse    Joelene Millin  From  [facility]    Requesting  When last    rx  For lisinopril /  hctz    Was  rx   [facility]  Reviewed   And  Last  Refill  Information  Given to      Nurse  -  Nurse  Had  [place]  As   Well   To  Confirm last    rx  Filled

## 2017-05-17 DIAGNOSIS — K219 Gastro-esophageal reflux disease without esophagitis: Secondary | ICD-10-CM | POA: Diagnosis not present

## 2017-05-17 DIAGNOSIS — E785 Hyperlipidemia, unspecified: Secondary | ICD-10-CM | POA: Diagnosis not present

## 2017-05-17 DIAGNOSIS — J101 Influenza due to other identified influenza virus with other respiratory manifestations: Secondary | ICD-10-CM | POA: Diagnosis not present

## 2017-05-17 DIAGNOSIS — E039 Hypothyroidism, unspecified: Secondary | ICD-10-CM | POA: Diagnosis not present

## 2017-05-17 DIAGNOSIS — I10 Essential (primary) hypertension: Secondary | ICD-10-CM | POA: Diagnosis not present

## 2017-05-17 DIAGNOSIS — J9601 Acute respiratory failure with hypoxia: Secondary | ICD-10-CM | POA: Diagnosis not present

## 2017-05-18 MED ORDER — HYDROCHLOROTHIAZIDE 12.5 MG PO TABS
12.50 | ORAL_TABLET | ORAL | Status: DC
Start: 2017-05-18 — End: 2017-05-18

## 2017-05-18 MED ORDER — GENERIC EXTERNAL MEDICATION
Status: DC
Start: ? — End: 2017-05-18

## 2017-05-18 MED ORDER — OSELTAMIVIR PHOSPHATE 75 MG PO CAPS
75.00 | ORAL_CAPSULE | ORAL | Status: DC
Start: 2017-05-17 — End: 2017-05-18

## 2017-05-18 MED ORDER — PANTOPRAZOLE SODIUM 40 MG PO TBEC
40.00 | DELAYED_RELEASE_TABLET | ORAL | Status: DC
Start: 2017-05-18 — End: 2017-05-18

## 2017-05-18 MED ORDER — HEPARIN SODIUM (PORCINE) 5000 UNIT/ML IJ SOLN
INTRAMUSCULAR | Status: DC
Start: 2017-05-17 — End: 2017-05-18

## 2017-05-18 MED ORDER — ATORVASTATIN CALCIUM 40 MG PO TABS
40.00 | ORAL_TABLET | ORAL | Status: DC
Start: 2017-05-17 — End: 2017-05-18

## 2017-05-18 MED ORDER — SODIUM CHLORIDE 0.9 % IV SOLN
INTRAVENOUS | Status: DC
Start: ? — End: 2017-05-18

## 2017-05-18 MED ORDER — LISINOPRIL 10 MG PO TABS
10.00 | ORAL_TABLET | ORAL | Status: DC
Start: 2017-05-18 — End: 2017-05-18

## 2017-05-18 MED ORDER — ZOLPIDEM TARTRATE 5 MG PO TABS
10.00 | ORAL_TABLET | ORAL | Status: DC
Start: ? — End: 2017-05-18

## 2017-05-18 MED ORDER — ACETAMINOPHEN 650 MG RE SUPP
650.00 | RECTAL | Status: DC
Start: ? — End: 2017-05-18

## 2017-05-18 MED ORDER — GUAIFENESIN 100 MG/5ML PO LIQD
200.00 | ORAL | Status: DC
Start: ? — End: 2017-05-18

## 2017-05-18 MED ORDER — TRAMADOL HCL 50 MG PO TABS
50.00 | ORAL_TABLET | ORAL | Status: DC
Start: ? — End: 2017-05-18

## 2017-05-18 MED ORDER — CYCLOBENZAPRINE HCL 10 MG PO TABS
10.00 | ORAL_TABLET | ORAL | Status: DC
Start: ? — End: 2017-05-18

## 2017-05-18 MED ORDER — HYDRALAZINE HCL 20 MG/ML IJ SOLN
10.00 | INTRAMUSCULAR | Status: DC
Start: ? — End: 2017-05-18

## 2017-05-18 MED ORDER — ALBUTEROL SULFATE (2.5 MG/3ML) 0.083% IN NEBU
2.50 | INHALATION_SOLUTION | RESPIRATORY_TRACT | Status: DC
Start: ? — End: 2017-05-18

## 2017-05-18 MED ORDER — NITROGLYCERIN 0.4 MG SL SUBL
0.40 | SUBLINGUAL_TABLET | SUBLINGUAL | Status: DC
Start: ? — End: 2017-05-18

## 2017-05-18 MED ORDER — GENERIC EXTERNAL MEDICATION
Status: DC
Start: 2017-05-17 — End: 2017-05-18

## 2017-05-18 MED ORDER — BENZONATATE 100 MG PO CAPS
200.00 | ORAL_CAPSULE | ORAL | Status: DC
Start: ? — End: 2017-05-18

## 2017-05-18 MED ORDER — ACETAMINOPHEN 325 MG PO TABS
650.00 | ORAL_TABLET | ORAL | Status: DC
Start: ? — End: 2017-05-18

## 2017-05-19 ENCOUNTER — Telehealth: Payer: Self-pay | Admitting: *Deleted

## 2017-05-19 MED ORDER — GENERIC EXTERNAL MEDICATION
Status: DC
Start: ? — End: 2017-05-19

## 2017-05-19 NOTE — Telephone Encounter (Signed)
thx

## 2017-05-19 NOTE — Telephone Encounter (Signed)
Transition Care Management Follow-up Telephone Call   Date discharged? 05/17/16   How have you been since you were released from the hospital? Feels weak   Do you understand why you were in the hospital? yes   Do you understand the discharge instructions? yes   Where were you discharged to? Home with dtr   Items Reviewed:  Medications reviewed:yes  Allergies reviewed: yes  Dietary changes reviewed: yes  Referrals reviewed: yes   Functional Questionnaire:   Activities of Daily Living (ADLs):   She states they are independent in the following: ambulation, bathing and hygiene, feeding, continence, grooming, toileting and dressing States they require assistance with the following: n/a   Any transportation issues/concerns?: no   Any patient concerns? no   Confirmed importance and date/time of follow-up visits scheduled yes  Provider Appointment booked with Dr.Paz 05/25/17 @940   Confirmed with patient if condition begins to worsen call PCP or go to the ER.  Patient was given the office number and encouraged to call back with question or concerns.  : yes

## 2017-05-24 DIAGNOSIS — Z09 Encounter for follow-up examination after completed treatment for conditions other than malignant neoplasm: Secondary | ICD-10-CM | POA: Diagnosis not present

## 2017-05-24 DIAGNOSIS — I10 Essential (primary) hypertension: Secondary | ICD-10-CM | POA: Diagnosis not present

## 2017-05-25 ENCOUNTER — Encounter: Payer: Self-pay | Admitting: Internal Medicine

## 2017-05-25 ENCOUNTER — Ambulatory Visit: Payer: Medicare HMO | Admitting: Internal Medicine

## 2017-05-25 VITALS — BP 126/82 | HR 78 | Temp 97.6°F | Resp 16 | Ht 63.0 in | Wt 247.4 lb

## 2017-05-25 DIAGNOSIS — I10 Essential (primary) hypertension: Secondary | ICD-10-CM

## 2017-05-25 DIAGNOSIS — J9601 Acute respiratory failure with hypoxia: Secondary | ICD-10-CM

## 2017-05-25 DIAGNOSIS — E039 Hypothyroidism, unspecified: Secondary | ICD-10-CM | POA: Diagnosis not present

## 2017-05-25 MED ORDER — BUDESONIDE-FORMOTEROL FUMARATE 80-4.5 MCG/ACT IN AERO: 2.0000 | INHALATION_SPRAY | Freq: Two times a day (BID) | RESPIRATORY_TRACT | 3 refills | Status: DC

## 2017-05-25 MED ORDER — LISINOPRIL-HYDROCHLOROTHIAZIDE 10-12.5 MG PO TABS: 1.0000 | ORAL_TABLET | Freq: Every day | ORAL | 5 refills | Status: DC

## 2017-05-25 NOTE — Progress Notes (Signed)
Pre visit review using our clinic review tool, if applicable. No additional management support is needed unless otherwise documented below in the visit note. 

## 2017-05-25 NOTE — Patient Instructions (Signed)
GO TO THE FRONT DESK Schedule your next appointment for a physical exam for April or May.  For the residual cough and increasing: Symbicort 2 puffs twice a day After 2-week, use it as needed. Call if you are not back to normal in the next 2-3 weeks.

## 2017-05-25 NOTE — Progress Notes (Signed)
Subjective:    Patient ID: Kelly Perkins, female    DOB: 06-29-1950, 67 y.o.   MRN: [id number]  DOS:  05/25/2017 Type of visit - description : TCM 14 Interval history: Admitted in 05/14/2017, discharge 05/17/2017 Admitted with influenza, acute respiratory failure with hypoxia. Was admitted with cough, aches.  Required oxygen therapy temporarily, eventually got better with supportive treatment and Tamiflu. Was discharged on Tamiflu, Tessalon Perles Labs: CT chest 05/17/2017: No focal consolidations 05/16/2017: CBC essentially normal with a hemoglobin of 12 05/17/2017: BMP, LFTs normal  Review of Systems Since she left the hospital she is feeling better, still have some residual cough, mild wheezing and clear sputum. No fever chills No chest pain, difficulty breathing or lower extremity edema.   Past Medical History:  Diagnosis Date  . GERD (gastroesophageal reflux disease)   . History of kidney stones   . History of lithotripsy    urolthiasis 2004  . Hyperlipidemia 09/10/2010  . Hypertension    no meds  . Hypothyroidism   . Kidney stones   . Normal cardiac stress test 6-12   had CP-DOE  . Osteoarthritis   . Wears dentures    top  . Wears glasses     Past Surgical History:  Procedure Laterality Date  . COLONOSCOPY     [id number]  . hysterectomy (unknown)  2000   B oophoreectomy, d/t DUB  . MASS EXCISION  09/12/2011   Procedure: EXCISION MASS;  Surgeon: Cristine Polio, MD;  Location: [facility];  Service: Plastics;  Laterality: Left;  left ring finger  . POLYPECTOMY     TA 2004  . THYROIDECTOMY    . TOTAL KNEE ARTHROPLASTY Left 11/17/2016   Procedure: LEFT TOTAL KNEE ARTHROPLASTY;  Surgeon: Leandrew Koyanagi, MD;  Location: [facility];  Service: Orthopedics;  Laterality: Left;    Social History   Socioeconomic History  . Marital status: Single    Spouse name: Not on file  . Number of children: 2  . Years of education: Not on file  . Highest education level: Not on  file  Social Needs  . Financial resource strain: Not on file  . Food insecurity - worry: Not on file  . Food insecurity - inability: Not on file  . Transportation needs - medical: Not on file  . Transportation needs - non-medical: Not on file  Occupational History  . 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 Smoking status: Never Smoker  . Smokeless tobacco: Never Used  Substance and Sexual Activity  . Alcohol use: Yes    Alcohol/week: 0.0 oz    Comment: socially  . Drug use: No  . Sexual activity: No  Other Topics Concern  . Not on file  Social History Narrative   Single, mom is one of my patients [name].           Allergies as of 05/25/2017   No Known Allergies     Medication List        Accurate as of 05/25/17 10:01 PM. Always use your most recent med list.          aspirin EC 325 MG tablet Take 1 tablet (325 mg total) by mouth 2 (two) times daily.   budesonide-formoterol 80-4.5 MCG/ACT inhaler Commonly known as:  SYMBICORT Inhale 2 puffs into the lungs 2 (two) times daily.   cyclobenzaprine 10 MG tablet Commonly known as:  FLEXERIL Take 1 tablet (10 mg total) by mouth  at bedtime as needed for muscle spasms.   diclofenac sodium 1 % Gel Commonly known as:  VOLTAREN Apply 2 g topically 4 (four) times daily.   levothyroxine 150 MCG tablet Commonly known as:  SYNTHROID, LEVOTHROID TAKE 1 TABLET (150 MCG TOTAL) BY MOUTH DAILY BEFORE BREAKFAST.   lisinopril-hydrochlorothiazide 10-12.5 MG tablet Commonly known as:  PRINZIDE,ZESTORETIC Take 1 tablet by mouth daily.   meloxicam 7.5 MG tablet Commonly known as:  MOBIC Take 1 tablet (7.5 mg total) by mouth 2 (two) times daily as needed for pain.   methocarbamol 750 MG tablet Commonly known as:  ROBAXIN TAKE 1 TABLET TWICE A DAY AS NEEDED FOR MUSCLE SPASMS   pantoprazole 40 MG tablet Commonly known as:  PROTONIX Take 1 tablet (40 mg total) by mouth daily.   simvastatin 20 MG  tablet Commonly known as:  ZOCOR Take 1 tablet (20 mg total) by mouth at bedtime.   traMADol 50 MG tablet Commonly known as:  ULTRAM TAKE 1-2 TABLETS TWICE DAILY AS NEEDED FOR PAIN   zolpidem 10 MG tablet Commonly known as:  AMBIEN TAKE 1 TABLET BY MOUTH AT BEDTIME          Objective:   Physical Exam BP 126/82 (BP Location: Right Arm, Patient Position: Sitting, Cuff Size: Normal)   Pulse 78   Temp 97.6 F (36.4 C) (Oral)   Resp 16   Ht 5\' 3"  (1.6 m)   Wt 247 lb 6 oz (112.2 kg)   SpO2 96%   BMI 43.82 kg/m  General:   Well developed, well nourished . NAD.  HEENT:  Normocephalic . Face symmetric, atraumatic Lungs:  Few rhonchi, + and expiratory wheezing. Normal respiratory effort, no intercostal retractions, no accessory muscle use. Heart: RRR,  no murmur.  no pretibial edema bilaterally  Abdomen:  Not distended, soft, non-tender. No rebound or rigidity.   Skin: Not pale. Not jaundice Neurologic:  alert & oriented X3.  Speech normal, gait appropriate for age and unassisted Psych--  Cognition and judgment appear intact.  Cooperative with normal attention span and concentration.  Behavior appropriate. No anxious or depressed appearing.     Assessment & Plan:    Assessment HTN Hyperlipidemia Insomnia-- ambien Hypothyroidism, s/p thyroidectomy GERD Mild anemia, normal iron 12-2013, cscope 08-2015 DJD -- tramadol rarely  h/o kidney stones  CP-DOE: Normal stress test 08/2010  PLAN:  Hypoxic respiratory failure due to influenza: She is doing much better, she still have some residual cough/ wheezing on exam. Rx trial with Symbicort for 2 weeks, then as needed.  Continue Mucinex DM or similar medication.  Call if not better. Work note provided HTN: Seems well controlled Hypothyroidism: Last TSH satisfactory RTC 1 or 2 months, CPX

## 2017-05-25 NOTE — Assessment & Plan Note (Signed)
Hypoxic respiratory failure due to influenza: She is doing much better, she still have some residual cough/ wheezing on exam. Rx trial with Symbicort for 2 weeks, then as needed.  Continue Mucinex DM or similar medication.  Call if not better. Work note provided HTN: Seems well controlled Hypothyroidism: Last TSH satisfactory RTC 1 or 2 months, CPX

## 2017-05-26 ENCOUNTER — Other Ambulatory Visit: Payer: Self-pay

## 2017-05-26 MED ORDER — LEVOTHYROXINE SODIUM 150 MCG PO TABS: 150.0000 ug | ORAL_TABLET | Freq: Every day | ORAL | 1 refills | Status: DC

## 2017-07-03 ENCOUNTER — Encounter: Payer: Self-pay | Admitting: Internal Medicine

## 2017-07-03 ENCOUNTER — Ambulatory Visit (INDEPENDENT_AMBULATORY_CARE_PROVIDER_SITE_OTHER): Payer: Medicare HMO | Admitting: Internal Medicine

## 2017-07-03 ENCOUNTER — Other Ambulatory Visit: Payer: Self-pay | Admitting: Internal Medicine

## 2017-07-03 VITALS — BP 126/68 | HR 68 | Temp 98.4°F | Resp 14 | Ht 63.0 in | Wt 251.1 lb

## 2017-07-03 DIAGNOSIS — Z1231 Encounter for screening mammogram for malignant neoplasm of breast: Secondary | ICD-10-CM

## 2017-07-03 DIAGNOSIS — E7800 Pure hypercholesterolemia, unspecified: Secondary | ICD-10-CM | POA: Diagnosis not present

## 2017-07-03 DIAGNOSIS — R739 Hyperglycemia, unspecified: Secondary | ICD-10-CM

## 2017-07-03 DIAGNOSIS — Z0000 Encounter for general adult medical examination without abnormal findings: Secondary | ICD-10-CM

## 2017-07-03 DIAGNOSIS — Z23 Encounter for immunization: Secondary | ICD-10-CM | POA: Diagnosis not present

## 2017-07-03 LAB — COMPREHENSIVE METABOLIC PANEL
ALT: 29 U/L (ref 0–35)
AST: 22 U/L (ref 0–37)
Albumin: 4 g/dL (ref 3.5–5.2)
Alkaline Phosphatase: 98 U/L (ref 39–117)
BILIRUBIN TOTAL: 0.4 mg/dL (ref 0.2–1.2)
BUN: 9 mg/dL (ref 6–23)
CALCIUM: 9.5 mg/dL (ref 8.4–10.5)
CHLORIDE: 103 meq/L (ref 96–112)
CO2: 29 mEq/L (ref 19–32)
Creatinine, Ser: 0.8 mg/dL (ref 0.40–1.20)
GFR: 91.57 mL/min (ref >60.00)
GLUCOSE: 94 mg/dL (ref 70–99)
POTASSIUM: 4 meq/L (ref 3.5–5.1)
Sodium: 141 mEq/L (ref 135–145)
Total Protein: 7.4 g/dL (ref 6.0–8.3)

## 2017-07-03 LAB — CBC WITH DIFFERENTIAL/PLATELET
BASOS ABS: 0 10*3/uL (ref 0.0–0.1)
Basophils Relative: 0.5 % (ref 0.0–3.0)
Eosinophils Absolute: 0.2 10*3/uL (ref 0.0–0.7)
Eosinophils Relative: 3.3 % (ref 0.0–5.0)
HCT: 33.8 % — ABNORMAL LOW (ref 36.0–46.0)
Hemoglobin: 11.4 g/dL — ABNORMAL LOW (ref 12.0–15.0)
LYMPHS ABS: 2 10*3/uL (ref 0.7–4.0)
Lymphocytes Relative: 40.3 % (ref 12.0–46.0)
MCHC: 33.6 g/dL (ref 30.0–36.0)
MCV: 90.6 fl (ref 78.0–100.0)
MONO ABS: 0.5 10*3/uL (ref 0.1–1.0)
MONOS PCT: 10 % (ref 3.0–12.0)
NEUTROS ABS: 2.3 10*3/uL (ref 1.4–7.7)
NEUTROS PCT: 45.9 % (ref 43.0–77.0)
PLATELETS: 218 10*3/uL (ref 150.0–400.0)
RBC: 3.73 Mil/uL — ABNORMAL LOW (ref 3.87–5.11)
RDW: 14.2 % (ref 11.5–15.5)
WBC: 5 10*3/uL (ref 4.0–10.5)

## 2017-07-03 LAB — LIPID PANEL
CHOL/HDL RATIO: 3
Cholesterol: 141 mg/dL (ref 0–200)
HDL: 44.4 mg/dL (ref >39.00)
LDL Cholesterol: 72 mg/dL (ref 0–99)
NONHDL: 96.86
TRIGLYCERIDES: 123 mg/dL (ref 0.0–149.0)
VLDL: 24.6 mg/dL (ref 0.0–40.0)

## 2017-07-03 LAB — TSH: TSH: 1.92 u[IU]/mL (ref 0.35–4.50)

## 2017-07-03 LAB — HEMOGLOBIN A1C: HEMOGLOBIN A1C: 5.4 % (ref 4.6–6.5)

## 2017-07-03 NOTE — Progress Notes (Signed)
Subjective:    Patient ID: Kelly Perkins, female    DOB: 1950/08/16, 67 y.o.   MRN: [id number]  DOS:  07/03/2017 Type of visit - description : cpx Interval history: Since the last visit, she is feeling in general well    Review of Systems Many years history of constipation, we never discussed that before, has only taken milk of magnesia from time to time.  Denies any nausea, vomiting, blood in the stools. Concerned about her weight.  Bariatric surgery?  Other than above, a 14 point review of systems is negative     Past Medical History:  Diagnosis Date  . GERD (gastroesophageal reflux disease)   . History of kidney stones   . History of lithotripsy    urolthiasis 2004  . Hyperlipidemia 09/10/2010  . Hypertension    no meds  . Hypothyroidism   . Kidney stones   . Normal cardiac stress test 6-12   had CP-DOE  . Osteoarthritis   . Wears dentures    top  . Wears glasses     Past Surgical History:  Procedure Laterality Date  . COLONOSCOPY     [id number]  . hysterectomy (unknown)  2000   B oophoreectomy, d/t DUB  . MASS EXCISION  09/12/2011   Procedure: EXCISION MASS;  Surgeon: Cristine Polio, MD;  Location: [facility];  Service: Plastics;  Laterality: Left;  left ring finger  . POLYPECTOMY     TA 2004  . THYROIDECTOMY    . TOTAL KNEE ARTHROPLASTY Left 11/17/2016   Procedure: LEFT TOTAL KNEE ARTHROPLASTY;  Surgeon: Leandrew Koyanagi, MD;  Location: [facility];  Service: Orthopedics;  Laterality: Left;    Social History   Socioeconomic History  . Marital status: Single    Spouse name: Not on file  . Number of children: 2  . Years of education: Not on file  . Highest education level: Not on file  Occupational History  . Occupation: school bus driver. Retired-- [employer]  . Financial resource strain: Not on file  . Food insecurity:    Worry: Not on file    Inability: Not on file  . Transportation needs:    Medical: Not on file    Non-medical:  Not on file  Tobacco Use  . Smoking status: Never Smoker  . Smokeless tobacco: Never Used  Substance and Sexual Activity  . Alcohol use: Yes    Alcohol/week: 0.0 oz    Comment: socially  . Drug use: No  . Sexual activity: Never  Lifestyle  . Physical activity:    Days per week: Not on file    Minutes per session: Not on file  . Stress: Not on file  Relationships  . Social connections:    Talks on phone: Not on file    Gets together: Not on file    Attends religious service: Not on file    Active member of club or organization: Not on file    Attends meetings of clubs or organizations: Not on file    Relationship status: Not on file  . Intimate partner violence:    Fear of current or ex partner: Not on file    Emotionally abused: Not on file    Physically abused: Not on file    Forced sexual activity: Not on file  Other Topics Concern  . Not on file  Social History Narrative   Single, mom is one of my patients [name].  Family History  Problem Relation Age of Onset  . Hypertension Mother   . Coronary artery disease Mother        onset?  Marland Kitchen Hypertension Sister   . Colon polyps Other        niece  . Diabetes Neg Hx   . Colon cancer Neg Hx   . Breast cancer Neg Hx   . Stroke Neg Hx   . Rectal cancer Neg Hx   . Stomach cancer Neg Hx      Allergies as of 07/03/2017   No Known Allergies     Medication List        Accurate as of 07/03/17  9:08 PM. Always use your most recent med list.          cyclobenzaprine 10 MG tablet Commonly known as:  FLEXERIL Take 1 tablet (10 mg total) by mouth at bedtime as needed for muscle spasms.   diclofenac sodium 1 % Gel Commonly known as:  VOLTAREN Apply 2 g topically 4 (four) times daily.   levothyroxine 150 MCG tablet Commonly known as:  SYNTHROID, LEVOTHROID Take 1 tablet (150 mcg total) by mouth daily before breakfast.   lisinopril-hydrochlorothiazide 10-12.5 MG tablet Commonly known as:   PRINZIDE,ZESTORETIC Take 1 tablet by mouth daily.   meloxicam 7.5 MG tablet Commonly known as:  MOBIC Take 1 tablet (7.5 mg total) by mouth 2 (two) times daily as needed for pain.   methocarbamol 750 MG tablet Commonly known as:  ROBAXIN TAKE 1 TABLET TWICE A DAY AS NEEDED FOR MUSCLE SPASMS   pantoprazole 40 MG tablet Commonly known as:  PROTONIX Take 1 tablet (40 mg total) by mouth daily.   simvastatin 20 MG tablet Commonly known as:  ZOCOR Take 1 tablet (20 mg total) by mouth at bedtime.   traMADol 50 MG tablet Commonly known as:  ULTRAM TAKE 1-2 TABLETS TWICE DAILY AS NEEDED FOR PAIN   zolpidem 10 MG tablet Commonly known as:  AMBIEN TAKE 1 TABLET BY MOUTH AT BEDTIME          Objective:   Physical Exam BP 126/68 (BP Location: Left Arm, Patient Position: Sitting, Cuff Size: Normal)   Pulse 68   Temp 98.4 F (36.9 C) (Oral)   Resp 14   Ht 5\' 3"  (1.6 m)   Wt 251 lb 2 oz (113.9 kg)   SpO2 98%   BMI 44.48 kg/m  General:   Well developed, morbidly obese appearing. NAD.  Neck: No  thyromegaly  HEENT:  Normocephalic . Face symmetric, atraumatic Lungs:  CTA B Normal respiratory effort, no intercostal retractions, no accessory muscle use. Heart: RRR,  no murmur.  No pretibial edema bilaterally  Abdomen:  Not distended, soft, non-tender. No rebound or rigidity.   Skin: Exposed areas without rash. Not pale. Not jaundice Neurologic:  alert & oriented X3.  Speech normal, gait appropriate for age and unassisted Strength symmetric and appropriate for age.  Psych: Cognition and judgment appear intact.  Cooperative with normal attention span and concentration.  Behavior appropriate. No anxious or depressed appearing.     Assessment & Plan:   Assessment HTN Hyperlipidemia Insomnia-- ambien Hypothyroidism, s/p thyroidectomy GERD Mild anemia, normal iron 12-2013, cscope 08-2015 DJD -- tramadol rarely  h/o kidney stones  CP-DOE: Normal stress test  08/2010  PLAN:  HTN: No ambulatory BPs, BP today is okay, recommend to check once or twice a month.  Continue Zestoretic.  Checking labs Hyperlipidemia: On simvastatin, checking labs Hypothyroidism: Checking a  TSH, continue Synthroid DJD: Currently on meloxicam, Ultram and Robaxin, takes them infrequently.   Also takes Flexeril at night (for leg cramps, rec not to mix muscle relaxants). Leg cramps at night: On Flexeril and tonic water, prn Chronic constipation: Recommend MiraLAX daily, high-fiber diet Morbid obesity: Patient wonders about bariatric surgery, has failed diet several times.  We took about the weight management clinics, information provided.  If that fails, I am not opposed to evaluation by the bariatric surgeons. RTC 6 months

## 2017-07-03 NOTE — Assessment & Plan Note (Addendum)
--  Td 06-2017; Pneumonia shot 2015; Prevnar 06-22-15;  Zostavax: states got it a [facility] --[facility]S: Last colonoscopy was in 2011, poor prep. Colonoscopy again 08/2015, no biopsies, next 5 years, 2 day prep. --Per guidelines  no further Pap smears --Mammogram 06-16-16 (-). Clinical breast exam 06-2015  Negative; plans to call for a MMG --DEXA wnl 06-2015 -- Diet and exercise discussed.   --CMP, FLP, CBC, A1c, TSH

## 2017-07-03 NOTE — Assessment & Plan Note (Signed)
HTN: No ambulatory BPs, BP today is okay, recommend to check once or twice a month.  Continue Zestoretic.  Checking labs Hyperlipidemia: On simvastatin, checking labs Hypothyroidism: Checking a TSH, continue Synthroid DJD: Currently on meloxicam, Ultram and Robaxin, takes them infrequently.   Also takes Flexeril at night (for leg cramps, rec not to mix muscle relaxants). Leg cramps at night: On Flexeril and tonic water, prn Chronic constipation: Recommend MiraLAX daily, high-fiber diet Morbid obesity: Patient wonders about bariatric surgery, has failed diet several times.  We took about the weight management clinics, information provided.  If that fails, I am not opposed to evaluation by the bariatric surgeons. RTC 6 months

## 2017-07-03 NOTE — Patient Instructions (Signed)
GO TO THE LAB : Get the blood work     GO TO THE FRONT DESK Schedule your next appointment for a routine checkup in 6 months   Check the  blood pressure 2  times a month   Be sure your blood pressure is between 110/65 and  135/85. If it is consistently higher or lower, let me know  Consider see one of the weight management clinics  For constipation: MiraLAX 17 g daily High fiber diet (fruits, vegetables) Call if that is not improving

## 2017-07-03 NOTE — Progress Notes (Signed)
Pre visit review using our clinic review tool, if applicable. No additional management support is needed unless otherwise documented below in the visit note. 

## 2017-07-04 ENCOUNTER — Telehealth (INDEPENDENT_AMBULATORY_CARE_PROVIDER_SITE_OTHER): Payer: Self-pay | Admitting: Radiology

## 2017-07-04 ENCOUNTER — Ambulatory Visit
Admission: RE | Admit: 2017-07-04 | Discharge: 2017-07-04 | Disposition: A | Discharge status: Home / Self Care | Payer: Medicare HMO | Source: Ambulatory Visit | Attending: Internal Medicine | Admitting: Internal Medicine

## 2017-07-04 DIAGNOSIS — Z1231 Encounter for screening mammogram for malignant neoplasm of breast: Secondary | ICD-10-CM

## 2017-07-04 NOTE — Telephone Encounter (Signed)
Patient came by and she is requesting a handicap placard.  Please call patient back at [phone].

## 2017-07-04 NOTE — Telephone Encounter (Signed)
Ok, thanks.

## 2017-07-04 NOTE — Telephone Encounter (Signed)
Ok to do temporary 6 months

## 2017-07-04 NOTE — Telephone Encounter (Signed)
Called patient no answer LMOM.

## 2017-07-04 NOTE — Telephone Encounter (Signed)
Ready for pick up at the front desk.  

## 2017-07-04 NOTE — Telephone Encounter (Signed)
Please advise. If so, for how long?  

## 2017-08-16 ENCOUNTER — Other Ambulatory Visit: Payer: Self-pay | Admitting: Internal Medicine

## 2017-08-16 DIAGNOSIS — K219 Gastro-esophageal reflux disease without esophagitis: Secondary | ICD-10-CM

## 2017-08-16 DIAGNOSIS — M25562 Pain in left knee: Secondary | ICD-10-CM

## 2017-08-16 DIAGNOSIS — M25561 Pain in right knee: Secondary | ICD-10-CM

## 2017-08-16 NOTE — Telephone Encounter (Signed)
Requesting: Cyclobenzaprine 10mg  tab HS prn Contract: none UDS: 01/05/2014 Last OV: 07/03/17 Next Ov: 12/29/2017 Last refill: 02/10/2017 Database: OD risk score 310/999  Please advise.

## 2017-08-19 ENCOUNTER — Telehealth: Payer: Self-pay | Admitting: Internal Medicine

## 2017-08-22 NOTE — Telephone Encounter (Signed)
Sent!

## 2017-08-22 NOTE — Telephone Encounter (Signed)
Pt is requesting refill on Ambien.   Last OV: 07/03/2017 Last Fill: 02/20/2017 #30 and 5rf UDS: Not needed for Ambien per PCP  Please advise.

## 2017-11-09 ENCOUNTER — Ambulatory Visit (INDEPENDENT_AMBULATORY_CARE_PROVIDER_SITE_OTHER): Payer: Medicare HMO | Admitting: Orthopaedic Surgery

## 2017-11-09 ENCOUNTER — Ambulatory Visit (INDEPENDENT_AMBULATORY_CARE_PROVIDER_SITE_OTHER): Payer: Medicare HMO

## 2017-11-09 ENCOUNTER — Ambulatory Visit (INDEPENDENT_AMBULATORY_CARE_PROVIDER_SITE_OTHER): Payer: Self-pay

## 2017-11-09 DIAGNOSIS — M1711 Unilateral primary osteoarthritis, right knee: Secondary | ICD-10-CM | POA: Diagnosis not present

## 2017-11-09 DIAGNOSIS — Z6841 Body Mass Index (BMI) 40.0 and over, adult: Secondary | ICD-10-CM

## 2017-11-09 DIAGNOSIS — M25561 Pain in right knee: Secondary | ICD-10-CM | POA: Diagnosis not present

## 2017-11-09 DIAGNOSIS — Z96652 Presence of left artificial knee joint: Secondary | ICD-10-CM | POA: Diagnosis not present

## 2017-11-09 NOTE — Progress Notes (Signed)
Office Visit Note   Patient: Kelly Perkins           Date of Birth: 1950-10-17           MRN: [id number] Visit Date: 11/09/2017              Requested by: Colon Branch, [address] [place], [place] [address] PCP: Colon Branch, MD   Assessment & Plan: Visit Diagnoses:  1. History of left knee replacement   2. Primary osteoarthritis of right knee   3. Body mass index 40.0-44.9, adult ([place])   4. Morbid obesity ([place])     Plan: At this point [name] has done very well from her left total knee replacement.  She would like to undergo a right total knee replacement.  Again we discussed the potential increased risk due to her increased BMI.  She states that she has lost weight recently.  She had no postoperative complications in the past.  She is willing to accept the potential risks and undergo the right total knee replacement.  All questions answered to her satisfaction.  We look forward to doing her right knee replacement in the near future.  Follow-Up Instructions: Return if symptoms worsen or fail to improve.   Orders:  Orders Placed This Encounter  Procedures  . XR KNEE 3 VIEW LEFT  . XR KNEE 3 VIEW RIGHT   No orders of the defined types were placed in this encounter.     Procedures: No procedures performed   Clinical Data: No additional findings.   Subjective: Chief Complaint  Patient presents with  . Right Knee - Follow-up    Kelly Perkins is a very pleasant 67 year old female who comes in 1 year status post left total knee replacement.  She has done very well with this and she is very happy with her recovery.  She is now interested in undergoing a right total knee replacement.  She had no postoperative complications with the left knee.  She is a school bus driver.  Her right knee is significantly limiting her.  She has chronic night pain and difficulty with ADLs.   Review of Systems  Constitutional: Negative.   HENT: Negative.   Eyes: Negative.     Respiratory: Negative.   Cardiovascular: Negative.   Endocrine: Negative.   Musculoskeletal: Negative.   Neurological: Negative.   Hematological: Negative.   Psychiatric/Behavioral: Negative.   All other systems reviewed and are negative.    Objective: Vital Signs: There were no vitals taken for this visit.  Physical Exam  Constitutional: She is oriented to person, place, and time. She appears well-developed and well-nourished.  Pulmonary/Chest: Effort normal.  Neurological: She is alert and oriented to person, place, and time.  Skin: Skin is warm. Capillary refill takes less than 2 seconds.  Psychiatric: She has a normal mood and affect. Her behavior is normal. Judgment and thought content normal.  Nursing note and vitals reviewed.   Ortho Exam Left knee exam shows a fully healed surgical scar.  Collaterals are stable.  Excellent range of motion. Right knee exam shows normal range of motion.  Collaterals and cruciates are stable.  No joint effusion. Specialty Comments:  No specialty comments available.  Imaging: Xr Knee 3 View Left  Result Date: 11/09/2017 Stable left total knee replacement without evidence of complication.  Xr Knee 3 View Right  Result Date: 11/09/2017 Advanced tricompartmental degenerative joint disease    PMFS History: Patient Active Problem List  Diagnosis Date Noted  . Morbid obesity ([place]) 07/03/2017  . Total knee replacement status 11/17/2016  . PCP NOTES >>>>>>>>>>> 06/22/2015  . Annual physical exam 06/09/2014  . GERD (gastroesophageal reflux disease) 06/09/2014  . Hyperlipidemia 09/10/2010  . Anemia 08/11/2010  . HTN (hypertension) 02/27/2008  . ECHOCARDIOGRAM, ABNORMAL 02/27/2008  . Hypothyroidism 04/13/2007  . INSOMNIA, CHRONIC 04/13/2007  . Osteoarthritis 04/13/2007   Past Medical History:  Diagnosis Date  . GERD (gastroesophageal reflux disease)   . History of kidney stones   . History of lithotripsy    urolthiasis 2004   . Hyperlipidemia 09/10/2010  . Hypertension    no meds  . Hypothyroidism   . Kidney stones   . Normal cardiac stress test 6-12   had CP-DOE  . Osteoarthritis   . Wears dentures    top  . Wears glasses     Family History  Problem Relation Age of Onset  . Hypertension Mother   . Coronary artery disease Mother        onset?  Marland Kitchen Hypertension Sister   . Colon polyps Other        niece  . Diabetes Neg Hx   . Colon cancer Neg Hx   . Breast cancer Neg Hx   . Stroke Neg Hx   . Rectal cancer Neg Hx   . Stomach cancer Neg Hx     Past Surgical History:  Procedure Laterality Date  . COLONOSCOPY     2800,3491  . hysterectomy (unknown)  2000   B oophoreectomy, d/t DUB  . MASS EXCISION  09/12/2011   Procedure: EXCISION MASS;  Surgeon: Cristine Polio, MD;  Location: [facility];  Service: Plastics;  Laterality: Left;  left ring finger  . POLYPECTOMY     TA 2004  . THYROIDECTOMY    . TOTAL KNEE ARTHROPLASTY Left 11/17/2016   Procedure: LEFT TOTAL KNEE ARTHROPLASTY;  Surgeon: Leandrew Koyanagi, MD;  Location: [facility];  Service: Orthopedics;  Laterality: Left;   Social History   Occupational History  . 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 Smoking status: Never Smoker  . Smokeless tobacco: Never Used  Substance and Sexual Activity  . Alcohol use: Yes    Alcohol/week: 0.0 standard drinks    Comment: socially  . Drug use: No  . Sexual activity: Never

## 2017-11-09 NOTE — Progress Notes (Deleted)
Office Visit Note   Patient: Kelly Perkins           Date of Birth: May 12, 1950           MRN: [id number] Visit Date: 11/09/2017              Requested by: Colon Branch, [address] [place], [place] [address] PCP: Colon Branch, MD   Assessment & Plan: Visit Diagnoses:  1. History of left knee replacement   2. Primary osteoarthritis of right knee   3. Body mass index 40.0-44.9, adult ([place])   4. Morbid obesity ([place])     Plan: ***  Follow-Up Instructions: No follow-ups on file.   Orders:  Orders Placed This Encounter  Procedures  . XR KNEE 3 VIEW LEFT  . XR KNEE 3 VIEW RIGHT   No orders of the defined types were placed in this encounter.     Procedures: No procedures performed   Clinical Data: No additional findings.   Subjective: Chief Complaint  Patient presents with  . Right Knee - Follow-up    HPI  Review of Systems   Objective: Vital Signs: There were no vitals taken for this visit.  Physical Exam  Ortho Exam  Specialty Comments:  No specialty comments available.  Imaging: No results found.   PMFS History: Patient Active Problem List   Diagnosis Date Noted  . Morbid obesity ([place]) 07/03/2017  . Total knee replacement status 11/17/2016  . PCP NOTES >>>>>>>>>>> 06/22/2015  . Annual physical exam 06/09/2014  . GERD (gastroesophageal reflux disease) 06/09/2014  . Hyperlipidemia 09/10/2010  . Anemia 08/11/2010  . HTN (hypertension) 02/27/2008  . ECHOCARDIOGRAM, ABNORMAL 02/27/2008  . Hypothyroidism 04/13/2007  . INSOMNIA, CHRONIC 04/13/2007  . Osteoarthritis 04/13/2007   Past Medical History:  Diagnosis Date  . GERD (gastroesophageal reflux disease)   . History of kidney stones   . History of lithotripsy    urolthiasis 2004  . Hyperlipidemia 09/10/2010  . Hypertension    no meds  . Hypothyroidism   . Kidney stones   . Normal cardiac stress test 6-12   had CP-DOE  . Osteoarthritis   . Wears dentures    top  .  Wears glasses     Family History  Problem Relation Age of Onset  . Hypertension Mother   . Coronary artery disease Mother        onset?  Marland Kitchen Hypertension Sister   . Colon polyps Other        niece  . Diabetes Neg Hx   . Colon cancer Neg Hx   . Breast cancer Neg Hx   . Stroke Neg Hx   . Rectal cancer Neg Hx   . Stomach cancer Neg Hx     Past Surgical History:  Procedure Laterality Date  . COLONOSCOPY     3614,4315  . hysterectomy (unknown)  2000   B oophoreectomy, d/t DUB  . MASS EXCISION  09/12/2011   Procedure: EXCISION MASS;  Surgeon: Cristine Polio, MD;  Location: [facility];  Service: Plastics;  Laterality: Left;  left ring finger  . POLYPECTOMY     TA 2004  . THYROIDECTOMY    . TOTAL KNEE ARTHROPLASTY Left 11/17/2016   Procedure: LEFT TOTAL KNEE ARTHROPLASTY;  Surgeon: Leandrew Koyanagi, MD;  Location: [facility];  Service: Orthopedics;  Laterality: Left;   Social History   Occupational History  . 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 Smoking status: Never Smoker  . Smokeless tobacco: Never Used  Substance and Sexual Activity  . Alcohol use: Yes    Alcohol/week: 0.0 standard drinks    Comment: socially  . Drug use: No  . Sexual activity: Never

## 2017-11-15 ENCOUNTER — Other Ambulatory Visit: Payer: Self-pay | Admitting: Internal Medicine

## 2017-11-16 ENCOUNTER — Encounter (HOSPITAL_COMMUNITY): Payer: Self-pay

## 2017-11-16 ENCOUNTER — Emergency Department (HOSPITAL_COMMUNITY)
Admission: EM | Admit: 2017-11-16 | Discharge: 2017-11-17 | Disposition: A | Discharge status: Home / Self Care | Payer: Medicare HMO | Source: Home / Self Care | Attending: Emergency Medicine | Admitting: Emergency Medicine

## 2017-11-16 ENCOUNTER — Other Ambulatory Visit: Payer: Self-pay | Admitting: Internal Medicine

## 2017-11-16 DIAGNOSIS — R319 Hematuria, unspecified: Secondary | ICD-10-CM | POA: Insufficient documentation

## 2017-11-16 DIAGNOSIS — R109 Unspecified abdominal pain: Secondary | ICD-10-CM

## 2017-11-16 DIAGNOSIS — E039 Hypothyroidism, unspecified: Secondary | ICD-10-CM

## 2017-11-16 DIAGNOSIS — N201 Calculus of ureter: Secondary | ICD-10-CM

## 2017-11-16 DIAGNOSIS — R1110 Vomiting, unspecified: Secondary | ICD-10-CM | POA: Diagnosis not present

## 2017-11-16 DIAGNOSIS — M5136 Other intervertebral disc degeneration, lumbar region: Secondary | ICD-10-CM | POA: Diagnosis not present

## 2017-11-16 DIAGNOSIS — N134 Hydroureter: Secondary | ICD-10-CM

## 2017-11-16 DIAGNOSIS — I10 Essential (primary) hypertension: Secondary | ICD-10-CM

## 2017-11-16 DIAGNOSIS — K219 Gastro-esophageal reflux disease without esophagitis: Secondary | ICD-10-CM | POA: Diagnosis not present

## 2017-11-16 DIAGNOSIS — Z96652 Presence of left artificial knee joint: Secondary | ICD-10-CM | POA: Diagnosis not present

## 2017-11-16 DIAGNOSIS — R112 Nausea with vomiting, unspecified: Secondary | ICD-10-CM

## 2017-11-16 DIAGNOSIS — E785 Hyperlipidemia, unspecified: Secondary | ICD-10-CM | POA: Diagnosis not present

## 2017-11-16 DIAGNOSIS — R0902 Hypoxemia: Secondary | ICD-10-CM | POA: Diagnosis not present

## 2017-11-16 DIAGNOSIS — N132 Hydronephrosis with renal and ureteral calculous obstruction: Secondary | ICD-10-CM | POA: Diagnosis not present

## 2017-11-16 DIAGNOSIS — Z79899 Other long term (current) drug therapy: Secondary | ICD-10-CM

## 2017-11-16 DIAGNOSIS — R1011 Right upper quadrant pain: Secondary | ICD-10-CM | POA: Diagnosis not present

## 2017-11-16 DIAGNOSIS — R1031 Right lower quadrant pain: Secondary | ICD-10-CM | POA: Diagnosis not present

## 2017-11-16 DIAGNOSIS — K760 Fatty (change of) liver, not elsewhere classified: Secondary | ICD-10-CM | POA: Diagnosis not present

## 2017-11-16 DIAGNOSIS — Z8249 Family history of ischemic heart disease and other diseases of the circulatory system: Secondary | ICD-10-CM | POA: Diagnosis not present

## 2017-11-16 DIAGNOSIS — N131 Hydronephrosis with ureteral stricture, not elsewhere classified: Secondary | ICD-10-CM | POA: Insufficient documentation

## 2017-11-16 DIAGNOSIS — Z6838 Body mass index (BMI) 38.0-38.9, adult: Secondary | ICD-10-CM | POA: Diagnosis not present

## 2017-11-16 DIAGNOSIS — M1990 Unspecified osteoarthritis, unspecified site: Secondary | ICD-10-CM | POA: Diagnosis not present

## 2017-11-16 DIAGNOSIS — R1111 Vomiting without nausea: Secondary | ICD-10-CM | POA: Diagnosis not present

## 2017-11-16 NOTE — ED Notes (Signed)
Bed: QH22 Expected date:  Expected time:  Means of arrival:  Comments: EMS 67 yo female flank upper abd pain

## 2017-11-16 NOTE — ED Triage Notes (Signed)
Pt arrived via [facility] due to right sided abdominal pain, and RUQ pain that is tender to touch. Pt states pain started around 5pm this evening. Pt reports one episode of vomiting.

## 2017-11-16 NOTE — ED Provider Notes (Signed)
[facility] DEPT Provider Note   CSN: [id number] Arrival date & time: 11/16/17  2339     History   Chief Complaint Chief Complaint  Patient presents with  . Flank Pain    HPI Kelly Perkins is a 67 y.o. female.  The history is provided by the patient and medical records.     67 y.o. F with hx of GERD, HTN, kidney stones, HLP, osteoarthritis, presenting to the ED for right flank pain.  States this started around 3pm, seems like it came out of nowhere.  Reports pain in her right lower abdomen, side, and back.  She reports some associated nausea and vomiting.  She denies any difficulty urinating, dysuria, hematuria.  States she has had kidney stones before and states this feels similar.  Prior stones have required lithotripsy due to large size.  No other prior abdominal surgeries.  She has not had any medications prior to arrival.  Past Medical History:  Diagnosis Date  . GERD (gastroesophageal reflux disease)   . History of kidney stones   . History of lithotripsy    urolthiasis 2004  . Hyperlipidemia 09/10/2010  . Hypertension    no meds  . Hypothyroidism   . Kidney stones   . Normal cardiac stress test 6-12   had CP-DOE  . Osteoarthritis   . Wears dentures    top  . Wears glasses     Patient Active Problem List   Diagnosis Date Noted  . Morbid obesity ([place]) 07/03/2017  . Total knee replacement status 11/17/2016  . PCP NOTES >>>>>>>>>>> 06/22/2015  . Annual physical exam 06/09/2014  . GERD (gastroesophageal reflux disease) 06/09/2014  . Hyperlipidemia 09/10/2010  . Anemia 08/11/2010  . HTN (hypertension) 02/27/2008  . ECHOCARDIOGRAM, ABNORMAL 02/27/2008  . Hypothyroidism 04/13/2007  . INSOMNIA, CHRONIC 04/13/2007  . Osteoarthritis 04/13/2007    Past Surgical History:  Procedure Laterality Date  . COLONOSCOPY     [id number]  . hysterectomy (unknown)  2000   B oophoreectomy, d/t DUB  . MASS EXCISION  09/12/2011   Procedure:  EXCISION MASS;  Surgeon: Cristine Polio, MD;  Location: [facility];  Service: Plastics;  Laterality: Left;  left ring finger  . POLYPECTOMY     TA 2004  . THYROIDECTOMY    . TOTAL KNEE ARTHROPLASTY Left 11/17/2016   Procedure: LEFT TOTAL KNEE ARTHROPLASTY;  Surgeon: Leandrew Koyanagi, MD;  Location: [facility];  Service: Orthopedics;  Laterality: Left;     OB History   None      Home Medications    Prior to Admission medications   Medication Sig Start Date End Date Taking? Authorizing Provider  cyclobenzaprine (FLEXERIL) 10 MG tablet TAKE 1 TABLET (10 MG TOTAL) BY MOUTH AT BEDTIME AS NEEDED FOR MUSCLE SPASMS. 08/18/17   Colon Branch, MD  diclofenac sodium (VOLTAREN) 1 % GEL Apply 2 g topically 4 (four) times daily. 05/11/17   Leandrew Koyanagi, MD  levothyroxine (SYNTHROID, LEVOTHROID) 150 MCG tablet Take 1 tablet (150 mcg total) by mouth daily before breakfast. 05/26/17   Colon Branch, MD  lisinopril-hydrochlorothiazide (PRINZIDE,ZESTORETIC) 10-12.5 MG tablet Take 1 tablet by mouth daily. 11/16/17   Colon Branch, MD  meloxicam (MOBIC) 7.5 MG tablet Take 1 tablet (7.5 mg total) by mouth 2 (two) times daily as needed for pain. Patient not taking: Reported on 07/03/2017 05/11/17   Leandrew Koyanagi, MD  methocarbamol (ROBAXIN) 750 MG tablet TAKE 1 TABLET TWICE A DAY  AS NEEDED FOR MUSCLE SPASMS 04/24/17   Leandrew Koyanagi, MD  pantoprazole (PROTONIX) 40 MG tablet TAKE 1 TABLET (40 MG TOTAL) BY MOUTH DAILY. 08/18/17   Colon Branch, MD  simvastatin (ZOCOR) 20 MG tablet Take 1 tablet (20 mg total) by mouth at bedtime. 11/16/17   Colon Branch, MD  traMADol (ULTRAM) 50 MG tablet TAKE 1-2 TABLETS TWICE DAILY AS NEEDED FOR PAIN 04/24/17   Leandrew Koyanagi, MD  zolpidem (AMBIEN) 10 MG tablet TAKE 1 TABLET BY MOUTH EVERYDAY AT BEDTIME 08/22/17   Colon Branch, MD    Family History Family History  Problem Relation Age of Onset  . Hypertension Mother   . Coronary artery disease Mother        onset?  Marland Kitchen Hypertension Sister     . Colon polyps Other        niece  . Diabetes Neg Hx   . Colon cancer Neg Hx   . Breast cancer Neg Hx   . Stroke Neg Hx   . Rectal cancer Neg Hx   . Stomach cancer Neg Hx     Social History Social History   Tobacco Use  . Smoking status: Never Smoker  . Smokeless tobacco: Never Used  Substance Use Topics  . Alcohol use: Yes    Alcohol/week: 0.0 standard drinks    Comment: socially  . Drug use: No     Allergies   Patient has no known allergies.   Review of Systems Review of Systems  Gastrointestinal: Positive for abdominal pain, nausea and vomiting.  Genitourinary: Positive for flank pain.  All other systems reviewed and are negative.    Physical Exam Updated Vital Signs BP (!) 149/78 (BP Location: Left Arm)   Pulse 80   Temp 97.7 F (36.5 C) (Oral)   Resp 15   Ht 5\' 3"  (1.6 m)   Wt 97.5 kg   SpO2 97%   BMI 38.09 kg/m   Physical Exam  Constitutional: She is oriented to person, place, and time. She appears well-developed and well-nourished.  HENT:  Head: Normocephalic and atraumatic.  Mouth/Throat: Oropharynx is clear and moist.  Eyes: Pupils are equal, round, and reactive to light. Conjunctivae and EOM are normal.  Neck: Normal range of motion.  Cardiovascular: Normal rate, regular rhythm and normal heart sounds.  Pulmonary/Chest: Effort normal and breath sounds normal. No stridor. No respiratory distress.  Abdominal: Soft. Bowel sounds are normal. There is no tenderness. There is no rebound.    Musculoskeletal: Normal range of motion.  Neurological: She is alert and oriented to person, place, and time.  Skin: Skin is warm and dry.  Psychiatric: She has a normal mood and affect.  Nursing note and vitals reviewed.    ED Treatments / Results  Labs (all labs ordered are listed, but only abnormal results are displayed) Labs Reviewed  CBC WITH DIFFERENTIAL/PLATELET - Abnormal; Notable for the following components:      Result Value   RBC 3.78 (*)     Hemoglobin 11.4 (*)    HCT 33.9 (*)    All other components within normal limits  BASIC METABOLIC PANEL - Abnormal; Notable for the following components:   Potassium 3.2 (*)    Glucose, Bld 147 (*)    All other components within normal limits  URINALYSIS, ROUTINE W REFLEX MICROSCOPIC - Abnormal; Notable for the following components:   Hgb urine dipstick LARGE (*)    Protein, ur 30 (*)    Leukocytes,  UA LARGE (*)    RBC / HPF >50 (*)    Bacteria, UA RARE (*)    All other components within normal limits  URINE CULTURE    EKG None  Radiology Ct Renal Stone Study  Result Date: 11/17/2017 CLINICAL DATA:  Right-sided abdominal pain. Right upper quadrant pain that is tender to touch since 5 p.m. this evening. One episode of vomiting. EXAM: CT ABDOMEN AND PELVIS WITHOUT CONTRAST TECHNIQUE: Multidetector CT imaging of the abdomen and pelvis was performed following the standard protocol without IV contrast. COMPARISON:  02/25/2008 FINDINGS: LOWER CHEST: Borderline cardiomegaly without pericardial effusion. Dependent atelectasis at the right lung base. No effusion or pneumothorax. HEPATOBILIARY: Mild steatosis of the liver. Unremarkable gallbladder. PANCREAS: Normal without inflammation or mass. No ductal dilatation. SPLEEN: Normal ADRENALS/URINARY TRACT: Orthotopic kidneys with left-sided renal calculi measuring up to 6 mm in the upper pole and 4 mm in the interpolar aspect. No hydroureteronephrosis on the left. Mild right-sided hydroureteronephrosis secondary to a 7 x 6 x 5 mm calculus at the pelvic brim. STOMACH/BOWEL: The stomach, small and large bowel are normal in course and caliber without inflammatory changes. Normal appendix. VASCULAR/LYMPHATIC: Aortoiliac vessels are normal in course and caliber. No lymphadenopathy by CT size criteria. REPRODUCTIVE: Hysterectomy.  No adnexal mass. OTHER: No intraperitoneal free fluid or free air. MUSCULOSKELETAL: Degenerative disc disease L4-5 with vacuum  disc phenomenon and mild degenerative grade 1 anterolisthesis of L4 and L5. No acute nor suspicious osseous abnormalities. IMPRESSION: 1. 7 x 6 x 5 mm calculus within the right distal ureter at the pelvic brim causing right-sided mild hydroureteronephrosis. 2. Left-sided nephrolithiasis. 3. Mild steatosis of the liver. 4. Degenerative disc disease L4-5 with grade 1 anterolisthesis of L4 on L5. Electronically Signed   By: Ashley Royalty M.D.   On: 11/17/2017 00:37    Procedures Procedures (including critical care time)  Medications Ordered in ED Medications - No data to display   Initial Impression / Assessment and Plan / ED Course  I have reviewed the triage vital signs and the nursing notes.  Pertinent labs & imaging results that were available during my care of the patient were reviewed by me and considered in my medical decision making (see chart for details).  67 year old female here with right-sided flank pain, sudden onset around 3 PM.  Does have history of kidney stones which she reports feels similar.  Last tetanus was 20 years ago.  She is afebrile and nontoxic.  Does have some mild tenderness along the right lateral abdomen without peritoneal signs.  Lab work overall reassuring, normal white blood cell count.  CT confirms 7x6x74mm stone in distal right ureter, mild hydroureteronephrosis.  Patient comfortable here after very low dose fentanyl and toradol.  No further nausea/vomtiing, tolerating PO well here.  Remains afebrile, non-toxic.  She does have some leuks in the urine sample here, however rare bacteria, nitrite negative.  She has no fever, tachycardia, leukocytosis, urinary symptoms, or uncontrolled pain.  Will hold off on abx treatment pending urine culture.  Given that pain is controlled and she is comfortable, do feel is reasonable for trial of outpatient management.  We will have her follow-up with urology.  She should return here for any new or worsening symptoms including difficulty  urinating, high fever, uncontrolled nausea or vomiting, or uncontrolled pain.  Patient and daughter at the bedside both acknowledge understanding and agreed with plan of care.  Final Clinical Impressions(s) / ED Diagnoses   Final diagnoses:  Right ureteral  stone  Hematuria, unspecified type  Flank pain    ED Discharge Orders         Ordered    oxyCODONE-acetaminophen (PERCOCET) 5-325 MG tablet  Every 4 hours PRN     11/17/17 0240    ondansetron (ZOFRAN ODT) 4 MG disintegrating tablet  Every 8 hours PRN     11/17/17 0240    tamsulosin (FLOMAX) 0.4 MG CAPS capsule  Daily after supper     11/17/17 0240           Larene Pickett, PA-C 11/17/17 [id number]    Varney Biles, MD 11/17/17 [phone]

## 2017-11-17 ENCOUNTER — Encounter (HOSPITAL_COMMUNITY): Payer: Self-pay | Admitting: *Deleted

## 2017-11-17 ENCOUNTER — Other Ambulatory Visit: Payer: Self-pay | Admitting: Internal Medicine

## 2017-11-17 ENCOUNTER — Ambulatory Visit (HOSPITAL_COMMUNITY)
Admission: RE | Admit: 2017-11-17 | Discharge: 2017-11-17 | Disposition: A | Discharge status: Home / Self Care | Payer: Medicare HMO | Source: Ambulatory Visit | Attending: Urology | Admitting: Urology

## 2017-11-17 ENCOUNTER — Other Ambulatory Visit: Payer: Self-pay | Admitting: Urology

## 2017-11-17 ENCOUNTER — Emergency Department (HOSPITAL_COMMUNITY): Payer: Medicare HMO

## 2017-11-17 ENCOUNTER — Ambulatory Visit (HOSPITAL_COMMUNITY): Payer: Medicare HMO | Admitting: Certified Registered Nurse Anesthetist

## 2017-11-17 ENCOUNTER — Ambulatory Visit (HOSPITAL_COMMUNITY): Payer: Medicare HMO

## 2017-11-17 ENCOUNTER — Encounter (HOSPITAL_COMMUNITY)
Admission: RE | Disposition: A | Discharge status: Home / Self Care | Payer: Self-pay | Source: Ambulatory Visit | Attending: Urology

## 2017-11-17 DIAGNOSIS — Z8249 Family history of ischemic heart disease and other diseases of the circulatory system: Secondary | ICD-10-CM | POA: Diagnosis not present

## 2017-11-17 DIAGNOSIS — R1110 Vomiting, unspecified: Secondary | ICD-10-CM | POA: Diagnosis not present

## 2017-11-17 DIAGNOSIS — N201 Calculus of ureter: Secondary | ICD-10-CM | POA: Diagnosis not present

## 2017-11-17 DIAGNOSIS — N23 Unspecified renal colic: Secondary | ICD-10-CM | POA: Diagnosis not present

## 2017-11-17 DIAGNOSIS — E039 Hypothyroidism, unspecified: Secondary | ICD-10-CM | POA: Insufficient documentation

## 2017-11-17 DIAGNOSIS — M1990 Unspecified osteoarthritis, unspecified site: Secondary | ICD-10-CM | POA: Diagnosis not present

## 2017-11-17 DIAGNOSIS — Z96652 Presence of left artificial knee joint: Secondary | ICD-10-CM | POA: Insufficient documentation

## 2017-11-17 DIAGNOSIS — K760 Fatty (change of) liver, not elsewhere classified: Secondary | ICD-10-CM | POA: Diagnosis not present

## 2017-11-17 DIAGNOSIS — N202 Calculus of kidney with calculus of ureter: Secondary | ICD-10-CM | POA: Diagnosis not present

## 2017-11-17 DIAGNOSIS — N132 Hydronephrosis with renal and ureteral calculous obstruction: Secondary | ICD-10-CM | POA: Diagnosis not present

## 2017-11-17 DIAGNOSIS — I10 Essential (primary) hypertension: Secondary | ICD-10-CM | POA: Diagnosis not present

## 2017-11-17 DIAGNOSIS — N130 Hydronephrosis with ureteropelvic junction obstruction: Secondary | ICD-10-CM | POA: Diagnosis not present

## 2017-11-17 DIAGNOSIS — K219 Gastro-esophageal reflux disease without esophagitis: Secondary | ICD-10-CM | POA: Insufficient documentation

## 2017-11-17 DIAGNOSIS — N1330 Unspecified hydronephrosis: Secondary | ICD-10-CM | POA: Diagnosis not present

## 2017-11-17 DIAGNOSIS — E785 Hyperlipidemia, unspecified: Secondary | ICD-10-CM | POA: Diagnosis not present

## 2017-11-17 DIAGNOSIS — Z6838 Body mass index (BMI) 38.0-38.9, adult: Secondary | ICD-10-CM | POA: Insufficient documentation

## 2017-11-17 DIAGNOSIS — M5136 Other intervertebral disc degeneration, lumbar region: Secondary | ICD-10-CM | POA: Insufficient documentation

## 2017-11-17 DIAGNOSIS — R1111 Vomiting without nausea: Secondary | ICD-10-CM | POA: Diagnosis not present

## 2017-11-17 HISTORY — PX: CYSTOSCOPY/URETEROSCOPY/HOLMIUM LASER/STENT PLACEMENT: SHX6546

## 2017-11-17 LAB — BASIC METABOLIC PANEL
Anion gap: 11 (ref 5–15)
BUN: 17 mg/dL (ref 8–23)
CALCIUM: 9.5 mg/dL (ref 8.9–10.3)
CO2: 29 mmol/L (ref 22–32)
CREATININE: 0.81 mg/dL (ref 0.44–1.00)
Chloride: 104 mmol/L (ref 98–111)
GFR calc Af Amer: >60 mL/min (ref >60)
Glucose, Bld: 147 mg/dL — ABNORMAL HIGH (ref 70–99)
POTASSIUM: 3.2 mmol/L — AB (ref 3.5–5.1)
SODIUM: 144 mmol/L (ref 135–145)

## 2017-11-17 LAB — URINALYSIS, ROUTINE W REFLEX MICROSCOPIC
Bilirubin Urine: NEGATIVE
GLUCOSE, UA: NEGATIVE mg/dL
Ketones, ur: NEGATIVE mg/dL
Nitrite: NEGATIVE
PH: 7 (ref 5.0–8.0)
Protein, ur: 30 mg/dL — AB
SPECIFIC GRAVITY, URINE: 1.014 (ref 1.005–1.030)

## 2017-11-17 LAB — CBC WITH DIFFERENTIAL/PLATELET
BASOS PCT: 0 %
Basophils Absolute: 0 10*3/uL (ref 0.0–0.1)
Eosinophils Absolute: 0 10*3/uL (ref 0.0–0.7)
Eosinophils Relative: 1 %
HEMATOCRIT: 33.9 % — AB (ref 36.0–46.0)
Hemoglobin: 11.4 g/dL — ABNORMAL LOW (ref 12.0–15.0)
Lymphocytes Relative: 17 %
Lymphs Abs: 1.3 10*3/uL (ref 0.7–4.0)
MCH: 30.2 pg (ref 26.0–34.0)
MCHC: 33.6 g/dL (ref 30.0–36.0)
MCV: 89.7 fL (ref 78.0–100.0)
MONOS PCT: 8 %
Monocytes Absolute: 0.6 10*3/uL (ref 0.1–1.0)
Neutro Abs: 5.7 10*3/uL (ref 1.7–7.7)
Neutrophils Relative %: 74 %
Platelets: 212 10*3/uL (ref 150–400)
RBC: 3.78 MIL/uL — ABNORMAL LOW (ref 3.87–5.11)
RDW: 13.4 % (ref 11.5–15.5)
WBC: 7.8 10*3/uL (ref 4.0–10.5)

## 2017-11-17 SURGERY — CYSTOSCOPY/URETEROSCOPY/HOLMIUM LASER/STENT PLACEMENT
Anesthesia: General | Laterality: Right

## 2017-11-17 MED ORDER — KETOROLAC TROMETHAMINE 15 MG/ML IJ SOLN
INTRAMUSCULAR | Status: AC
  Administered 2017-11-17: 15 mg
  Filled 2017-11-17: qty 1

## 2017-11-17 MED ORDER — LIDOCAINE 2% (20 MG/ML) 5 ML SYRINGE
INTRAMUSCULAR | Status: DC | PRN
  Administered 2017-11-17: 60 mg via INTRAVENOUS

## 2017-11-17 MED ORDER — LACTATED RINGERS IV SOLN
INTRAVENOUS | Status: DC
  Administered 2017-11-17 (×3): via INTRAVENOUS

## 2017-11-17 MED ORDER — MIDAZOLAM HCL 2 MG/2ML IJ SOLN
INTRAMUSCULAR | Status: AC
  Filled 2017-11-17: qty 2

## 2017-11-17 MED ORDER — KETOROLAC TROMETHAMINE 30 MG/ML IJ SOLN: 15.0000 mg | Freq: Once | INTRAMUSCULAR | Status: DC

## 2017-11-17 MED ORDER — FENTANYL CITRATE (PF) 100 MCG/2ML IJ SOLN
INTRAMUSCULAR | Status: DC | PRN
  Administered 2017-11-17: 25 ug via INTRAVENOUS
  Administered 2017-11-17: 50 ug via INTRAVENOUS
  Administered 2017-11-17: 25 ug via INTRAVENOUS
  Administered 2017-11-17: 50 ug via INTRAVENOUS
  Administered 2017-11-17: 25 ug via INTRAVENOUS

## 2017-11-17 MED ORDER — FENTANYL CITRATE (PF) 100 MCG/2ML IJ SOLN
50.0000 ug | Freq: Once | INTRAMUSCULAR | Status: AC
  Administered 2017-11-17: 50 ug via INTRAVENOUS
  Filled 2017-11-17: qty 2

## 2017-11-17 MED ORDER — FENTANYL CITRATE (PF) 100 MCG/2ML IJ SOLN
INTRAMUSCULAR | Status: AC
  Filled 2017-11-17: qty 2

## 2017-11-17 MED ORDER — TAMSULOSIN HCL 0.4 MG PO CAPS: 0.4000 mg | ORAL_CAPSULE | Freq: Every day | ORAL | 0 refills | Status: DC

## 2017-11-17 MED ORDER — DEXAMETHASONE SODIUM PHOSPHATE 10 MG/ML IJ SOLN
INTRAMUSCULAR | Status: DC | PRN
  Administered 2017-11-17: 5 mg via INTRAVENOUS

## 2017-11-17 MED ORDER — ONDANSETRON HCL 4 MG/2ML IJ SOLN
INTRAMUSCULAR | Status: DC | PRN
  Administered 2017-11-17: 4 mg via INTRAVENOUS

## 2017-11-17 MED ORDER — CEFAZOLIN SODIUM-DEXTROSE 2-4 GM/100ML-% IV SOLN
2.0000 g | Freq: Once | INTRAVENOUS | Status: AC
  Administered 2017-11-17: 2 g via INTRAVENOUS
  Filled 2017-11-17: qty 100

## 2017-11-17 MED ORDER — ONDANSETRON HCL 4 MG PO TABS: 4.0000 mg | ORAL_TABLET | Freq: Every day | ORAL | 1 refills | Status: DC | PRN

## 2017-11-17 MED ORDER — ONDANSETRON HCL 4 MG/2ML IJ SOLN
4.0000 mg | Freq: Once | INTRAMUSCULAR | Status: AC
  Administered 2017-11-17: 4 mg via INTRAVENOUS
  Filled 2017-11-17: qty 2

## 2017-11-17 MED ORDER — 0.9 % SODIUM CHLORIDE (POUR BTL) OPTIME
TOPICAL | Status: DC | PRN
  Administered 2017-11-17: 1000 mL

## 2017-11-17 MED ORDER — PROPOFOL 10 MG/ML IV BOLUS
INTRAVENOUS | Status: DC | PRN
  Administered 2017-11-17: 170 mg via INTRAVENOUS

## 2017-11-17 MED ORDER — MIDAZOLAM HCL 2 MG/2ML IJ SOLN
INTRAMUSCULAR | Status: DC | PRN
  Administered 2017-11-17: 1 mg via INTRAVENOUS

## 2017-11-17 MED ORDER — SODIUM CHLORIDE 0.9 % IR SOLN
Status: DC | PRN
  Administered 2017-11-17 (×2): 3000 mL via INTRAVESICAL

## 2017-11-17 MED ORDER — OXYCODONE-ACETAMINOPHEN 5-325 MG PO TABS: 1.0000 | ORAL_TABLET | ORAL | 0 refills | Status: DC | PRN

## 2017-11-17 MED ORDER — SODIUM CHLORIDE 0.9 % IV SOLN
INTRAVENOUS | Status: DC | PRN
  Administered 2017-11-17: 10 mL

## 2017-11-17 MED ORDER — PHENAZOPYRIDINE HCL 200 MG PO TABS: 200.0000 mg | ORAL_TABLET | Freq: Three times a day (TID) | ORAL | 0 refills | Status: DC | PRN

## 2017-11-17 MED ORDER — ONDANSETRON 4 MG PO TBDP: 4.0000 mg | ORAL_TABLET | Freq: Three times a day (TID) | ORAL | 0 refills | Status: DC | PRN

## 2017-11-17 MED ORDER — CEPHALEXIN 500 MG PO CAPS: 500.0000 mg | ORAL_CAPSULE | Freq: Two times a day (BID) | ORAL | 0 refills | Status: AC

## 2017-11-17 MED ORDER — PROPOFOL 10 MG/ML IV BOLUS
INTRAVENOUS | Status: AC
  Filled 2017-11-17: qty 20

## 2017-11-17 SURGICAL SUPPLY — 22 items
BAG URO CATCHER STRL LF (MISCELLANEOUS) ×2 IMPLANT
BASKET ZERO TIP NITINOL 2.4FR (BASKET) ×1 IMPLANT
BSKT STON RTRVL ZERO TP 2.4FR (BASKET) ×1
CATH URET 5FR 28IN OPEN ENDED (CATHETERS) ×1 IMPLANT
CLOTH BEACON ORANGE TIMEOUT ST (SAFETY) ×2 IMPLANT
EXTRACTOR STONE NITINOL NGAGE (UROLOGICAL SUPPLIES) IMPLANT
FIBER LASER FLEXIVA 365 (UROLOGICAL SUPPLIES) ×1 IMPLANT
FIBER LASER TRAC TIP (UROLOGICAL SUPPLIES) IMPLANT
GLOVE BIOGEL M STRL SZ7.5 (GLOVE) ×2 IMPLANT
GLOVE BIOGEL PI IND STRL 7.0 (GLOVE) IMPLANT
GLOVE BIOGEL PI INDICATOR 7.0 (GLOVE) ×1
GOWN STRL REUS W/TWL 2XL LVL3 (GOWN DISPOSABLE) ×1 IMPLANT
GOWN STRL REUS W/TWL XL LVL3 (GOWN DISPOSABLE) ×2 IMPLANT
GUIDEWIRE STR DUAL SENSOR (WIRE) IMPLANT
GUIDEWIRE ZIPWRE .038 STRAIGHT (WIRE) ×2 IMPLANT
MANIFOLD NEPTUNE II (INSTRUMENTS) ×2 IMPLANT
PACK CYSTO (CUSTOM PROCEDURE TRAY) ×2 IMPLANT
SHEATH URETERAL 12FRX35CM (MISCELLANEOUS) IMPLANT
STENT CONTOUR 6FRX26X.038 (STENTS) IMPLANT
STENT URET 6FRX24 CONTOUR (STENTS) ×1 IMPLANT
TUBING CONNECTING 10 (TUBING) ×2 IMPLANT
TUBING UROLOGY SET (TUBING) ×2 IMPLANT

## 2017-11-17 NOTE — ED Notes (Signed)
Pt aware we need a urine sample, unable to void at this time

## 2017-11-17 NOTE — H&P (Signed)
Urology Preoperative H&P   Chief Complaint: Right flank pain  History of Present Illness: Kelly Perkins is a 67 y.o. female  seen in the emergency department this morning with a 24 hour history of sharp, constant, nonradiating right flank pain associated with nausea/vomiting.  She denies fever/chills and is AFVSS in the office this morning.  She is urinating without difficulty and denies incomplete bladder emptying, urgency/frequency, dysuria or hematuria.  She has a remote history of nephrolithiasis and required ESWL several years ago.  CT stone study from 11/17/2017 showed a 7 mm right distal ureteral calculus with mild hydronephrosis.    Labs 11/17/2017 Serum creatinine-0.81 WBC-7.8 Urinalysis showed rare bacteria with microscopic hematuria and positive leukocytes.  She is unable to provide a urine specimen in the office this morning.  CTSS 11/17/17 IMPRESSION: 1. 7 x 6 x 5 mm calculus within the right distal ureter at the pelvic brim causing right-sided mild hydroureteronephrosis. 2. Left-sided nephrolithiasis. 3. Mild steatosis of the liver. 4. Degenerative disc disease L4-5 with grade 1 anterolisthesis of L4 on L5.    Past Medical History:  Diagnosis Date  . GERD (gastroesophageal reflux disease)   . History of kidney stones   . History of lithotripsy    urolthiasis 2004  . Hyperlipidemia 09/10/2010  . Hypertension    no meds  . Hypothyroidism   . Kidney stones   . Normal cardiac stress test 6-12   had CP-DOE  . Osteoarthritis   . Wears dentures    top  . Wears glasses     Past Surgical History:  Procedure Laterality Date  . COLONOSCOPY     [id number]  . hysterectomy (unknown)  2000   B oophoreectomy, d/t DUB  . MASS EXCISION  09/12/2011   Procedure: EXCISION MASS;  Surgeon: Cristine Polio, MD;  Location: [place];  Service: Plastics;  Laterality: Left;  left ring finger  . POLYPECTOMY     TA 2004  . THYROIDECTOMY    . TOTAL KNEE ARTHROPLASTY Left  11/17/2016   Procedure: LEFT TOTAL KNEE ARTHROPLASTY;  Surgeon: Leandrew Koyanagi, MD;  Location: [facility];  Service: Orthopedics;  Laterality: Left;    Allergies: No Known Allergies  Family History  Problem Relation Age of Onset  . Hypertension Mother   . Coronary artery disease Mother        onset?  Marland Kitchen Hypertension Sister   . Colon polyps Other        niece  . Diabetes Neg Hx   . Colon cancer Neg Hx   . Breast cancer Neg Hx   . Stroke Neg Hx   . Rectal cancer Neg Hx   . Stomach cancer Neg Hx     Social History:  reports that she has never smoked. She has never used smokeless tobacco. She reports that she drinks alcohol. She reports that she does not use drugs.  ROS: A complete review of systems was performed.  All systems are negative except for pertinent findings as noted.  Physical Exam:  Vital signs in last 24 hours: Temp:  [97.7 F (36.5 C)] 97.7 F (36.5 C) (09/05 2349) Pulse Rate:  [80-83] 83 (09/06 0248) Resp:  [15] 15 (09/06 0248) BP: (125-149)/(74-78) 125/74 (09/06 0248) SpO2:  [97 %-100 %] 100 % (09/06 0248) Weight:  [97.5 kg] 97.5 kg (09/05 2348) Constitutional:  Alert and oriented, No acute distress Cardiovascular: Regular rate and rhythm, No JVD Respiratory: Normal respiratory effort, Lungs clear bilaterally GI: Abdomen is soft,  nontender, nondistended, no abdominal masses GU: No CVA tenderness Lymphatic: No lymphadenopathy Neurologic: Grossly intact, no focal deficits Psychiatric: Normal mood and affect  Laboratory Data:  Recent Labs    11/17/17 0035  WBC 7.8  HGB 11.4*  HCT 33.9*  PLT 212    Recent Labs    11/17/17 0035  NA 144  K 3.2*  CL 104  GLUCOSE 147*  BUN 17  CALCIUM 9.5  CREATININE 0.81     Results for orders placed or performed during the hospital encounter of 11/16/17 (from the past 24 hour(s))  CBC with Differential     Status: Abnormal   Collection Time: 11/17/17 12:35 AM  Result Value Ref Range   WBC 7.8 4.0 - 10.5 K/uL    RBC 3.78 (L) 3.87 - 5.11 MIL/uL   Hemoglobin 11.4 (L) 12.0 - 15.0 g/dL   HCT 33.9 (L) 36.0 - 46.0 %   MCV 89.7 78.0 - 100.0 fL   MCH 30.2 26.0 - 34.0 pg   MCHC 33.6 30.0 - 36.0 g/dL   RDW 13.4 11.5 - 15.5 %   Platelets 212 150 - 400 K/uL   Neutrophils Relative % 74 %   Neutro Abs 5.7 1.7 - 7.7 K/uL   Lymphocytes Relative 17 %   Lymphs Abs 1.3 0.7 - 4.0 K/uL   Monocytes Relative 8 %   Monocytes Absolute 0.6 0.1 - 1.0 K/uL   Eosinophils Relative 1 %   Eosinophils Absolute 0.0 0.0 - 0.7 K/uL   Basophils Relative 0 %   Basophils Absolute 0.0 0.0 - 0.1 K/uL  Basic metabolic panel     Status: Abnormal   Collection Time: 11/17/17 12:35 AM  Result Value Ref Range   Sodium 144 135 - 145 mmol/L   Potassium 3.2 (L) 3.5 - 5.1 mmol/L   Chloride 104 98 - 111 mmol/L   CO2 29 22 - 32 mmol/L   Glucose, Bld 147 (H) 70 - 99 mg/dL   BUN 17 8 - 23 mg/dL   Creatinine, Ser 0.81 0.44 - 1.00 mg/dL   Calcium 9.5 8.9 - 10.3 mg/dL   GFR calc non Af Amer >60 >60 mL/min   GFR calc Af Amer >60 >60 mL/min   Anion gap 11 5 - 15  Urinalysis, Routine w reflex microscopic     Status: Abnormal   Collection Time: 11/17/17  1:37 AM  Result Value Ref Range   Color, Urine YELLOW YELLOW   APPearance CLEAR CLEAR   Specific Gravity, Urine 1.014 1.005 - 1.030   pH 7.0 5.0 - 8.0   Glucose, UA NEGATIVE NEGATIVE mg/dL   Hgb urine dipstick LARGE (A) NEGATIVE   Bilirubin Urine NEGATIVE NEGATIVE   Ketones, ur NEGATIVE NEGATIVE mg/dL   Protein, ur 30 (A) NEGATIVE mg/dL   Nitrite NEGATIVE NEGATIVE   Leukocytes, UA LARGE (A) NEGATIVE   RBC / HPF >50 (H) 0 - 5 RBC/hpf   WBC, UA 11-20 0 - 5 WBC/hpf   Bacteria, UA RARE (A) NONE SEEN   Squamous Epithelial / LPF 0-5 0 - 5   Mucus PRESENT    No results found for this or any previous visit (from the past 240 hour(s)).  Renal Function: Recent Labs    11/17/17 0035  CREATININE 0.81   Estimated Creatinine Clearance: 73.9 mL/min (by C-G formula based on SCr of 0.81  mg/dL).  Radiologic Imaging: Ct Renal Stone Study  Result Date: 11/17/2017 CLINICAL DATA:  Right-sided abdominal pain. Right upper quadrant pain that  is tender to touch since 5 p.m. this evening. One episode of vomiting. EXAM: CT ABDOMEN AND PELVIS WITHOUT CONTRAST TECHNIQUE: Multidetector CT imaging of the abdomen and pelvis was performed following the standard protocol without IV contrast. COMPARISON:  02/25/2008 FINDINGS: LOWER CHEST: Borderline cardiomegaly without pericardial effusion. Dependent atelectasis at the right lung base. No effusion or pneumothorax. HEPATOBILIARY: Mild steatosis of the liver. Unremarkable gallbladder. PANCREAS: Normal without inflammation or mass. No ductal dilatation. SPLEEN: Normal ADRENALS/URINARY TRACT: Orthotopic kidneys with left-sided renal calculi measuring up to 6 mm in the upper pole and 4 mm in the interpolar aspect. No hydroureteronephrosis on the left. Mild right-sided hydroureteronephrosis secondary to a 7 x 6 x 5 mm calculus at the pelvic brim. STOMACH/BOWEL: The stomach, small and large bowel are normal in course and caliber without inflammatory changes. Normal appendix. VASCULAR/LYMPHATIC: Aortoiliac vessels are normal in course and caliber. No lymphadenopathy by CT size criteria. REPRODUCTIVE: Hysterectomy.  No adnexal mass. OTHER: No intraperitoneal free fluid or free air. MUSCULOSKELETAL: Degenerative disc disease L4-5 with vacuum disc phenomenon and mild degenerative grade 1 anterolisthesis of L4 and L5. No acute nor suspicious osseous abnormalities. IMPRESSION: 1. 7 x 6 x 5 mm calculus within the right distal ureter at the pelvic brim causing right-sided mild hydroureteronephrosis. 2. Left-sided nephrolithiasis. 3. Mild steatosis of the liver. 4. Degenerative disc disease L4-5 with grade 1 anterolisthesis of L4 on L5. Electronically Signed   By: Ashley Royalty M.D.   On: 11/17/2017 00:37    I independently reviewed the above imaging studies.  Assessment and  Plan ROSEMARY MOSSBARGER is a 67 y.o. female with a 7 mm right distal ureteral stone with right hydronephrosis and severe right renal colic.  She also has two, nonobstructing left renal stones  The risks, benefits and alternatives of cystoscopy with RIGHT ureteroscopy, laser lithotripsy and ureteral stent placement was discussed the patient.  Risks included, but are not limited to: bleeding, urinary tract infection, ureteral injury/avulsion, ureteral stricture formation, retained stone fragments, the possibility that multiple surgeries may be required to treat the stone(s), MI, stroke, PE and the inherent risks of general anesthesia.  The patient voices understanding and wishes to proceed.      Ellison Hughs, MD 11/17/2017, 1:25 PM  [facility] Pager: [phone]

## 2017-11-17 NOTE — Discharge Instructions (Signed)
Take the prescribed medication as directed. Follow-up with urology-- can call for appt. Return to the ED for new or worsening symptoms-- high fever, uncontrolled pain, nausea, or vomiting, difficulty or inability to urinate, etc.

## 2017-11-17 NOTE — Transfer of Care (Signed)
Immediate Anesthesia Transfer of Care Note  Patient: Kelly Perkins  Procedure(s) Performed: CYSTOSCOPY/RIGHT URETEROSCOPY/RIGHT HOLMIUM LASER/STENT PLACEMENT (Right )  Patient Location: PACU  Anesthesia Type:General  Level of Consciousness: sedated  Airway & Oxygen Therapy: Patient Spontanous Breathing and Patient connected to face mask oxygen  Post-op Assessment: Report given to RN and Post -op Vital signs reviewed and stable  Post vital signs: Reviewed and stable  Last Vitals:  Vitals Value Taken Time  BP    Temp    Pulse    Resp    SpO2      Last Pain:  Vitals:   11/17/17 1607  TempSrc:   PainSc: 0-No pain         Complications: No apparent anesthesia complications

## 2017-11-17 NOTE — Op Note (Signed)
Operative Note  Preoperative diagnosis:  1.  7 mm right distal ureteral stone 2.  Right hydronephrosis 3.  Right renal colic 4.  Bilateral nonobstructing renal stones  Postoperative diagnosis: 1.  Same  Procedure(s): 1.  Cystoscopy 2.  Right retrograde pyelogram with intraoperative interpretation of fluoroscopic imaging 3.  Right semirigid ureteroscopy 4.  Right holmium laser lithotripsy 5.  Right JJ stent placement  Surgeon: Ellison Hughs, MD  Assistants:  None  Anesthesia:  General  Complications:  None  EBL: Less than 5 mL  Specimens: 1.  Left distal ureteral stone  Drains/Catheters: 1.  Right 6 French by 24 cm JJ stent without tether  Intraoperative findings:   1. Obstructing 7 mm right distal ureteral calculus 2. Right retrograde pyelogram showed uniform dilation of the entire length of the right ureter as well as dilation of the right renal pelvis and its associated calyces.  There were no other filling defects seen on retrograde pyelogram  Indication:  Kelly Perkins is a 67 y.o. female with a 7 mm right distal ureteral calculus associated with right-sided hydronephrosis and right renal colic.  She has been consented for the above procedures, voices understanding and wishes to proceed.  Description of procedure:  After informed consent was obtained, the patient was brought to the operating room and general LMA anesthesia was administered. The patient was then placed in the dorsolithotomy position and prepped and draped in usual sterile fashion. A timeout was performed. A 23 French rigid cystoscope was then inserted into the urethral meatus and advanced into the bladder under direct vision. A complete bladder survey revealed no intravesical pathology.  A 5 French open-ended catheter was then inserted into the right ureteral orifice and a right retrograde pyelogram was obtained, with the findings listed above.  A Glidewire was then advanced through the lumen of  the catheter and up to the right renal pelvis, under fluoroscopic guidance.  A semirigid ureteroscope was then advanced into the distal aspect of the right ureter, immediately identifying her 7 mm stone.  A 365 m holmium laser was then used to fracture the stone into numerous smaller fragments.  A nitinol tipless basket was then used to extract all stone fragments from the lumen of the right ureter.  The ureteroscope was removed and the rigid cystoscope was reinserted over the wire.  A 6 French by 24 cm JJ stent was then advanced over the wire and into good position within the right collecting system, confirming placement via fluoroscopy.  The patient's bladder was drained and all stone fragments were removed from the lumen of the bladder.  She tolerated the procedure well and was transferred to the postanesthesia in stable condition.  Plan: Follow-up in 1 week for office cystoscopy and stent removal

## 2017-11-17 NOTE — Anesthesia Preprocedure Evaluation (Addendum)
Anesthesia Evaluation  Patient identified by MRN, date of birth, ID band Patient awake    Reviewed: Allergy & Precautions, H&P , NPO status , Patient's Chart, lab work & pertinent test results  Airway Mallampati: II   Neck ROM: full    Dental  (+) Dental Advisory Given, Poor Dentition, Missing, Chipped   Pulmonary neg pulmonary ROS,    breath sounds clear to auscultation       Cardiovascular hypertension,  Rhythm:regular Rate:Normal     Neuro/Psych    GI/Hepatic GERD  ,  Endo/Other  Hypothyroidism Morbid obesity  Renal/GU stones     Musculoskeletal  (+) Arthritis ,   Abdominal   Peds  Hematology   Anesthesia Other Findings   Reproductive/Obstetrics                            Anesthesia Physical Anesthesia Plan  ASA: II  Anesthesia Plan: General   Post-op Pain Management:    Induction: Intravenous  PONV Risk Score and Plan: 3 and Ondansetron, Dexamethasone and Treatment may vary due to age or medical condition  Airway Management Planned: LMA  Additional Equipment:   Intra-op Plan:   Post-operative Plan:   Informed Consent: I have reviewed the patients History and Physical, chart, labs and discussed the procedure including the risks, benefits and alternatives for the proposed anesthesia with the patient or authorized representative who has indicated his/her understanding and acceptance.     Plan Discussed with: Anesthesiologist, CRNA and Surgeon  Anesthesia Plan Comments:         Anesthesia Quick Evaluation

## 2017-11-17 NOTE — Anesthesia Procedure Notes (Signed)
Date/Time: 11/17/2017 5:57 PM Performed by: Cynda Familia, CRNA Oxygen Delivery Method: Simple face mask Placement Confirmation: positive ETCO2 and breath sounds checked- equal and bilateral Dental Injury: Teeth and Oropharynx as per pre-operative assessment

## 2017-11-17 NOTE — Anesthesia Procedure Notes (Signed)
Procedure Name: LMA Insertion Date/Time: 11/17/2017 5:23 PM Performed by: Cynda Familia, CRNA Pre-anesthesia Checklist: Patient identified, Emergency Drugs available, Suction available and Patient being monitored Patient Re-evaluated:Patient Re-evaluated prior to induction Oxygen Delivery Method: Circle System Utilized Preoxygenation: Pre-oxygenation with 100% oxygen Induction Type: IV induction Ventilation: Mask ventilation without difficulty LMA: LMA inserted LMA Size: 4.0 Tube type: Oral (20 cc air) Number of attempts: 1 Placement Confirmation: positive ETCO2 Tube secured with: Tape Dental Injury: Teeth and Oropharynx as per pre-operative assessment  Comments: Smooth IV induction Hodierne-- LMA insertion placed by AM CRNA and advanced by Hodierne--- atraumatic-- teeth and mouth as preop-- only few remaining teeth- unchanged after LMA insertion-- bilat BS

## 2017-11-18 LAB — URINE CULTURE

## 2017-11-19 NOTE — Anesthesia Postprocedure Evaluation (Signed)
Anesthesia Post Note  Patient: Kelly Perkins  Procedure(s) Performed: CYSTOSCOPY/RIGHT URETEROSCOPY/RIGHT HOLMIUM LASER/STENT PLACEMENT (Right )     Patient location during evaluation: PACU Anesthesia Type: General Level of consciousness: awake and alert Pain management: pain level controlled Vital Signs Assessment: post-procedure vital signs reviewed and stable Respiratory status: spontaneous breathing, nonlabored ventilation, respiratory function stable and patient connected to nasal cannula oxygen Cardiovascular status: blood pressure returned to baseline and stable Postop Assessment: no apparent nausea or vomiting Anesthetic complications: no    Last Vitals:  Vitals:   11/17/17 1837 11/17/17 1857  BP: (!) 164/87 132/77  Pulse: 83 77  Resp: 18 16  Temp: (!) 36.4 C 36.4 C  SpO2: 95% 96%    Last Pain:  Vitals:   11/17/17 1837  TempSrc:   PainSc: 0-No pain                 Lanorris Kalisz S

## 2017-11-20 ENCOUNTER — Encounter (HOSPITAL_COMMUNITY): Payer: Self-pay | Admitting: Urology

## 2017-11-22 NOTE — Pre-Procedure Instructions (Signed)
Kelly Perkins  11/22/2017      [facility] [address], [place] [address] Phone: [phone] Fax: [phone]    Your procedure is scheduled on December 04, 2017.  Report to [facility] Admitting at 800 AM.  Call this number if you have problems the morning of surgery:  [phone]   Remember:  Do not eat or drink after midnight.    Take these medicines the morning of surgery with A SIP OF WATER  Cyclobenzaprine (flexeril)-if needed for muscle spasms Levothyroxine (synthroid) Ondansetron (zofran)-if needed for nausea Oxycodone-acetaminophen (percocet)-if needed for pain   7 days prior to surgery STOP taking any Aspirin (unless otherwise instructed by your surgeon), Aleve, Naproxen, Ibuprofen, Motrin, Advil, Goody's, BC's, all herbal medications, fish oil, and all vitamins  Contacts, dentures or bridgework may not be worn into surgery.  Leave your suitcase in the car.  After surgery it may be brought to your room.  For patients admitted to the hospital, discharge time will be determined by your treatment team.  Patients discharged the day of surgery will not be allowed to drive home.    - Preparing For Surgery  Before surgery, you can play an important role. Because skin is not sterile, your skin needs to be as free of germs as possible. You can reduce the number of germs on your skin by washing with CHG (chlorahexidine gluconate) Soap before surgery.  CHG is an antiseptic cleaner which kills germs and bonds with the skin to continue killing germs even after washing.    Oral Hygiene is also important to reduce your risk of infection.  Remember - BRUSH YOUR TEETH THE MORNING OF SURGERY WITH YOUR REGULAR TOOTHPASTE  Please do not use if you have an allergy to CHG or antibacterial soaps. If your skin becomes reddened/irritated stop using the CHG.  Do not shave (including legs and underarms)  for at least 48 hours prior to first CHG shower. It is OK to shave your face.  Please follow these instructions carefully.   1. Shower the NIGHT BEFORE SURGERY and the MORNING OF SURGERY with CHG.   2. If you chose to wash your hair, wash your hair first as usual with your normal shampoo.  3. After you shampoo, rinse your hair and body thoroughly to remove the shampoo.  4. Use CHG as you would any other liquid soap. You can apply CHG directly to the skin and wash gently with a scrungie or a clean washcloth.   5. Apply the CHG Soap to your body ONLY FROM THE NECK DOWN.  Do not use on open wounds or open sores. Avoid contact with your eyes, ears, mouth and genitals (private parts). Wash Face and genitals (private parts)  with your normal soap.  6. Wash thoroughly, paying special attention to the area where your surgery will be performed.  7. Thoroughly rinse your body with warm water from the neck down.  8. DO NOT shower/wash with your normal soap after using and rinsing off the CHG Soap.  9. Pat yourself dry with a CLEAN TOWEL.  10. Wear CLEAN PAJAMAS to bed the night before surgery, wear comfortable clothes the morning of surgery  11. Place CLEAN SHEETS on your bed the night of your first shower and DO NOT SLEEP WITH PETS.  Day of Surgery:              Do not wear  jewelry, make-up or nail polish.  Do not wear lotions, powders, or perfumes, or deodorant.  Do not shave 48 hours prior to surgery.    Do not bring valuables to the hospital.   [facility] is not responsible for any belongings or valuables.  REMEMBER, Day of surgery: Do not apply any deodorants/lotions.  Please wear clean clothes to the hospital/surgery center.   Remember to brush your teeth WITH YOUR REGULAR TOOTHPASTE.  Please read over the following fact sheets that you were given. Pain Booklet, Coughing and Deep Breathing, MRSA Information and Surgical Site Infection Prevention

## 2017-11-23 ENCOUNTER — Encounter (HOSPITAL_COMMUNITY)
Admission: RE | Admit: 2017-11-23 | Discharge: 2017-11-23 | Disposition: A | Discharge status: Home / Self Care | Payer: Medicare HMO | Source: Ambulatory Visit | Attending: Orthopaedic Surgery | Admitting: Orthopaedic Surgery

## 2017-11-23 ENCOUNTER — Encounter (HOSPITAL_COMMUNITY): Payer: Self-pay

## 2017-11-23 ENCOUNTER — Other Ambulatory Visit: Payer: Self-pay

## 2017-11-23 DIAGNOSIS — Z01818 Encounter for other preprocedural examination: Secondary | ICD-10-CM | POA: Insufficient documentation

## 2017-11-23 DIAGNOSIS — M1711 Unilateral primary osteoarthritis, right knee: Secondary | ICD-10-CM | POA: Diagnosis not present

## 2017-11-23 LAB — BASIC METABOLIC PANEL
Anion gap: 12 (ref 5–15)
BUN: 12 mg/dL (ref 8–23)
CALCIUM: 9.1 mg/dL (ref 8.9–10.3)
CO2: 22 mmol/L (ref 22–32)
Chloride: 106 mmol/L (ref 98–111)
Creatinine, Ser: 0.8 mg/dL (ref 0.44–1.00)
GFR calc Af Amer: >60 mL/min (ref >60)
Glucose, Bld: 126 mg/dL — ABNORMAL HIGH (ref 70–99)
POTASSIUM: 3.3 mmol/L — AB (ref 3.5–5.1)
Sodium: 140 mmol/L (ref 135–145)

## 2017-11-23 LAB — CBC
HEMATOCRIT: 33.9 % — AB (ref 36.0–46.0)
Hemoglobin: 10.5 g/dL — ABNORMAL LOW (ref 12.0–15.0)
MCH: 29.2 pg (ref 26.0–34.0)
MCHC: 31 g/dL (ref 30.0–36.0)
MCV: 94.4 fL (ref 78.0–100.0)
Platelets: 223 10*3/uL (ref 150–400)
RBC: 3.59 MIL/uL — ABNORMAL LOW (ref 3.87–5.11)
RDW: 13.2 % (ref 11.5–15.5)
WBC: 5.1 10*3/uL (ref 4.0–10.5)

## 2017-11-23 LAB — TYPE AND SCREEN
ABO/RH(D): O POS
ANTIBODY SCREEN: NEGATIVE

## 2017-11-23 LAB — SURGICAL PCR SCREEN
MRSA, PCR: NEGATIVE
STAPHYLOCOCCUS AUREUS: NEGATIVE

## 2017-11-23 NOTE — Progress Notes (Signed)
PCP: Kathlene November, MD  Cardiologist: pt denies  EKG: obtained today, denies past year  Stress test: pt denies in past 5 years  ECHO: pt denies  Cardiac Cath: pt denies  Chest x-ray: 05/14/17 -Care Everywhere

## 2017-11-24 DIAGNOSIS — N202 Calculus of kidney with calculus of ureter: Secondary | ICD-10-CM | POA: Diagnosis not present

## 2017-11-24 DIAGNOSIS — N130 Hydronephrosis with ureteropelvic junction obstruction: Secondary | ICD-10-CM | POA: Diagnosis not present

## 2017-12-01 MED ORDER — TRANEXAMIC ACID 1000 MG/10ML IV SOLN
2000.0000 mg | INTRAVENOUS | Status: AC
  Administered 2017-12-04: 2000 mg via TOPICAL
  Filled 2017-12-01: qty 20

## 2017-12-01 MED ORDER — CEFAZOLIN SODIUM-DEXTROSE 2-4 GM/100ML-% IV SOLN
2.0000 g | INTRAVENOUS | Status: AC
  Administered 2017-12-04: 2 g via INTRAVENOUS

## 2017-12-01 MED ORDER — CEFAZOLIN SODIUM-DEXTROSE 2-4 GM/100ML-% IV SOLN
2.0000 g | INTRAVENOUS | Status: DC
  Filled 2017-12-01: qty 100

## 2017-12-01 MED ORDER — TRANEXAMIC ACID 1000 MG/10ML IV SOLN
1000.0000 mg | INTRAVENOUS | Status: AC
  Administered 2017-12-04: 1000 mg via INTRAVENOUS
  Filled 2017-12-01: qty 1000

## 2017-12-04 ENCOUNTER — Encounter (HOSPITAL_COMMUNITY): Payer: Self-pay | Admitting: *Deleted

## 2017-12-04 ENCOUNTER — Inpatient Hospital Stay (HOSPITAL_COMMUNITY): Payer: Medicare HMO

## 2017-12-04 ENCOUNTER — Ambulatory Visit (HOSPITAL_COMMUNITY): Payer: Medicare HMO | Admitting: Certified Registered Nurse Anesthetist

## 2017-12-04 ENCOUNTER — Encounter (HOSPITAL_COMMUNITY)
Admission: AD | Disposition: A | Discharge status: Home Health | Payer: Self-pay | Source: Home / Self Care | Attending: Orthopaedic Surgery

## 2017-12-04 ENCOUNTER — Inpatient Hospital Stay (HOSPITAL_COMMUNITY)
Admission: AD | Admit: 2017-12-04 | Discharge: 2017-12-06 | DRG: 470 | Disposition: A | Discharge status: Home Health | Payer: Medicare HMO | Attending: Orthopaedic Surgery | Admitting: Orthopaedic Surgery

## 2017-12-04 ENCOUNTER — Other Ambulatory Visit: Payer: Self-pay

## 2017-12-04 DIAGNOSIS — M1711 Unilateral primary osteoarthritis, right knee: Principal | ICD-10-CM

## 2017-12-04 DIAGNOSIS — D62 Acute posthemorrhagic anemia: Secondary | ICD-10-CM | POA: Diagnosis not present

## 2017-12-04 DIAGNOSIS — E039 Hypothyroidism, unspecified: Secondary | ICD-10-CM | POA: Diagnosis present

## 2017-12-04 DIAGNOSIS — Z87442 Personal history of urinary calculi: Secondary | ICD-10-CM

## 2017-12-04 DIAGNOSIS — E669 Obesity, unspecified: Secondary | ICD-10-CM | POA: Diagnosis present

## 2017-12-04 DIAGNOSIS — Z96652 Presence of left artificial knee joint: Secondary | ICD-10-CM | POA: Diagnosis present

## 2017-12-04 DIAGNOSIS — Z6841 Body Mass Index (BMI) 40.0 and over, adult: Secondary | ICD-10-CM | POA: Diagnosis not present

## 2017-12-04 DIAGNOSIS — Z8371 Family history of colonic polyps: Secondary | ICD-10-CM | POA: Diagnosis not present

## 2017-12-04 DIAGNOSIS — Z79891 Long term (current) use of opiate analgesic: Secondary | ICD-10-CM | POA: Diagnosis not present

## 2017-12-04 DIAGNOSIS — I10 Essential (primary) hypertension: Secondary | ICD-10-CM | POA: Diagnosis not present

## 2017-12-04 DIAGNOSIS — K219 Gastro-esophageal reflux disease without esophagitis: Secondary | ICD-10-CM | POA: Diagnosis not present

## 2017-12-04 DIAGNOSIS — Z90710 Acquired absence of both cervix and uterus: Secondary | ICD-10-CM

## 2017-12-04 DIAGNOSIS — Z8249 Family history of ischemic heart disease and other diseases of the circulatory system: Secondary | ICD-10-CM

## 2017-12-04 DIAGNOSIS — Z96651 Presence of right artificial knee joint: Secondary | ICD-10-CM

## 2017-12-04 DIAGNOSIS — M25761 Osteophyte, right knee: Secondary | ICD-10-CM | POA: Diagnosis present

## 2017-12-04 DIAGNOSIS — Z972 Presence of dental prosthetic device (complete) (partial): Secondary | ICD-10-CM

## 2017-12-04 DIAGNOSIS — E785 Hyperlipidemia, unspecified: Secondary | ICD-10-CM | POA: Diagnosis present

## 2017-12-04 DIAGNOSIS — Z79890 Hormone replacement therapy (postmenopausal): Secondary | ICD-10-CM | POA: Diagnosis not present

## 2017-12-04 DIAGNOSIS — Z23 Encounter for immunization: Secondary | ICD-10-CM

## 2017-12-04 DIAGNOSIS — Z791 Long term (current) use of non-steroidal anti-inflammatories (NSAID): Secondary | ICD-10-CM

## 2017-12-04 DIAGNOSIS — Z79899 Other long term (current) drug therapy: Secondary | ICD-10-CM | POA: Diagnosis not present

## 2017-12-04 DIAGNOSIS — Z471 Aftercare following joint replacement surgery: Secondary | ICD-10-CM | POA: Diagnosis not present

## 2017-12-04 DIAGNOSIS — Z90722 Acquired absence of ovaries, bilateral: Secondary | ICD-10-CM | POA: Diagnosis not present

## 2017-12-04 DIAGNOSIS — Z96659 Presence of unspecified artificial knee joint: Secondary | ICD-10-CM

## 2017-12-04 HISTORY — PX: TOTAL KNEE ARTHROPLASTY: SHX125

## 2017-12-04 SURGERY — ARTHROPLASTY, KNEE, TOTAL
Anesthesia: Spinal | Site: Knee | Laterality: Right

## 2017-12-04 MED ORDER — SORBITOL 70 % SOLN: 30.0000 mL | Freq: Every day | Status: DC | PRN

## 2017-12-04 MED ORDER — MAGNESIUM CITRATE PO SOLN: 1.0000 | Freq: Once | ORAL | Status: DC | PRN

## 2017-12-04 MED ORDER — METHOCARBAMOL 500 MG PO TABS
500.0000 mg | ORAL_TABLET | Freq: Four times a day (QID) | ORAL | Status: DC | PRN
  Administered 2017-12-04 – 2017-12-06 (×3): 500 mg via ORAL
  Filled 2017-12-04 (×3): qty 1

## 2017-12-04 MED ORDER — FENTANYL CITRATE (PF) 100 MCG/2ML IJ SOLN
INTRAMUSCULAR | Status: AC
  Administered 2017-12-04: 75 ug via INTRAVENOUS
  Filled 2017-12-04: qty 2

## 2017-12-04 MED ORDER — PROPOFOL 10 MG/ML IV BOLUS
INTRAVENOUS | Status: DC | PRN
  Administered 2017-12-04 (×2): 20 mg via INTRAVENOUS

## 2017-12-04 MED ORDER — OXYCODONE HCL 5 MG PO TABS
5.0000 mg | ORAL_TABLET | ORAL | Status: DC | PRN
  Administered 2017-12-04 (×2): 5 mg via ORAL
  Administered 2017-12-05 – 2017-12-06 (×3): 10 mg via ORAL
  Filled 2017-12-04: qty 2
  Filled 2017-12-04 (×2): qty 1
  Filled 2017-12-04: qty 2

## 2017-12-04 MED ORDER — MENTHOL 3 MG MT LOZG: 1.0000 | LOZENGE | OROMUCOSAL | Status: DC | PRN

## 2017-12-04 MED ORDER — 0.9 % SODIUM CHLORIDE (POUR BTL) OPTIME
TOPICAL | Status: DC | PRN
  Administered 2017-12-04: 1000 mL

## 2017-12-04 MED ORDER — PROMETHAZINE HCL 25 MG PO TABS: 25.0000 mg | ORAL_TABLET | Freq: Four times a day (QID) | ORAL | 1 refills | Status: DC | PRN

## 2017-12-04 MED ORDER — METOCLOPRAMIDE HCL 5 MG/ML IJ SOLN: 5.0000 mg | Freq: Three times a day (TID) | INTRAMUSCULAR | Status: DC | PRN

## 2017-12-04 MED ORDER — PHENYLEPHRINE 40 MCG/ML (10ML) SYRINGE FOR IV PUSH (FOR BLOOD PRESSURE SUPPORT)
PREFILLED_SYRINGE | INTRAVENOUS | Status: AC
  Filled 2017-12-04: qty 10

## 2017-12-04 MED ORDER — BUPIVACAINE LIPOSOME 1.3 % IJ SUSP
20.0000 mL | INTRAMUSCULAR | Status: AC
  Administered 2017-12-04: 20 mL
  Filled 2017-12-04: qty 20

## 2017-12-04 MED ORDER — HYDROMORPHONE HCL 1 MG/ML IJ SOLN
0.5000 mg | INTRAMUSCULAR | Status: DC | PRN
  Administered 2017-12-04 (×2): 0.5 mg via INTRAVENOUS
  Administered 2017-12-05: 1 mg via INTRAVENOUS
  Filled 2017-12-04 (×2): qty 1

## 2017-12-04 MED ORDER — SULFAMETHOXAZOLE-TRIMETHOPRIM 800-160 MG PO TABS
1.0000 | ORAL_TABLET | Freq: Two times a day (BID) | ORAL | Status: DC
  Administered 2017-12-04 – 2017-12-06 (×4): 1 via ORAL
  Filled 2017-12-04 (×4): qty 1

## 2017-12-04 MED ORDER — DOCUSATE SODIUM 100 MG PO CAPS
100.0000 mg | ORAL_CAPSULE | Freq: Two times a day (BID) | ORAL | Status: DC
  Administered 2017-12-04 – 2017-12-06 (×4): 100 mg via ORAL
  Filled 2017-12-04 (×4): qty 1

## 2017-12-04 MED ORDER — ACETAMINOPHEN 500 MG PO TABS
1000.0000 mg | ORAL_TABLET | Freq: Four times a day (QID) | ORAL | Status: AC
  Administered 2017-12-04 – 2017-12-05 (×3): 1000 mg via ORAL
  Filled 2017-12-04 (×3): qty 2

## 2017-12-04 MED ORDER — PHENOL 1.4 % MT LIQD: 1.0000 | OROMUCOSAL | Status: DC | PRN

## 2017-12-04 MED ORDER — CELECOXIB 200 MG PO CAPS
200.0000 mg | ORAL_CAPSULE | Freq: Two times a day (BID) | ORAL | Status: DC
  Administered 2017-12-06: 200 mg via ORAL
  Filled 2017-12-04: qty 1

## 2017-12-04 MED ORDER — FENTANYL CITRATE (PF) 100 MCG/2ML IJ SOLN
75.0000 ug | Freq: Once | INTRAMUSCULAR | Status: AC
  Administered 2017-12-04: 75 ug via INTRAVENOUS

## 2017-12-04 MED ORDER — HYDROMORPHONE HCL 1 MG/ML IJ SOLN: 0.2500 mg | INTRAMUSCULAR | Status: DC | PRN

## 2017-12-04 MED ORDER — SODIUM CHLORIDE 0.9 % IV SOLN
INTRAVENOUS | Status: DC | PRN
  Administered 2017-12-04: 30 ug/min via INTRAVENOUS

## 2017-12-04 MED ORDER — ONDANSETRON HCL 4 MG/2ML IJ SOLN: 4.0000 mg | Freq: Four times a day (QID) | INTRAMUSCULAR | Status: DC | PRN

## 2017-12-04 MED ORDER — LACTATED RINGERS IV SOLN
INTRAVENOUS | Status: DC
  Administered 2017-12-04 (×2): via INTRAVENOUS

## 2017-12-04 MED ORDER — PHENYLEPHRINE 40 MCG/ML (10ML) SYRINGE FOR IV PUSH (FOR BLOOD PRESSURE SUPPORT)
PREFILLED_SYRINGE | INTRAVENOUS | Status: DC | PRN
  Administered 2017-12-04: 120 ug via INTRAVENOUS

## 2017-12-04 MED ORDER — SENNOSIDES-DOCUSATE SODIUM 8.6-50 MG PO TABS: 1.0000 | ORAL_TABLET | Freq: Every evening | ORAL | 1 refills | Status: DC | PRN

## 2017-12-04 MED ORDER — KETOROLAC TROMETHAMINE 15 MG/ML IJ SOLN
15.0000 mg | Freq: Four times a day (QID) | INTRAMUSCULAR | Status: AC
  Administered 2017-12-04 – 2017-12-05 (×3): 15 mg via INTRAVENOUS
  Filled 2017-12-04 (×3): qty 1

## 2017-12-04 MED ORDER — DEXAMETHASONE SODIUM PHOSPHATE 10 MG/ML IJ SOLN
INTRAMUSCULAR | Status: DC | PRN
  Administered 2017-12-04: 5 mg via INTRAVENOUS

## 2017-12-04 MED ORDER — MIDAZOLAM HCL 2 MG/2ML IJ SOLN
2.0000 mg | Freq: Once | INTRAMUSCULAR | Status: AC
  Administered 2017-12-04: 2 mg via INTRAVENOUS

## 2017-12-04 MED ORDER — SODIUM CHLORIDE 0.9% FLUSH
INTRAVENOUS | Status: DC | PRN
  Administered 2017-12-04: 10 mL

## 2017-12-04 MED ORDER — ONDANSETRON HCL 4 MG PO TABS: 4.0000 mg | ORAL_TABLET | Freq: Four times a day (QID) | ORAL | Status: DC | PRN

## 2017-12-04 MED ORDER — VANCOMYCIN HCL 1000 MG IV SOLR
INTRAVENOUS | Status: AC
  Filled 2017-12-04: qty 1000

## 2017-12-04 MED ORDER — OXYCODONE HCL 5 MG PO TABS: 5.0000 mg | ORAL_TABLET | Freq: Once | ORAL | Status: DC | PRN

## 2017-12-04 MED ORDER — GABAPENTIN 300 MG PO CAPS
300.0000 mg | ORAL_CAPSULE | Freq: Three times a day (TID) | ORAL | Status: DC
  Administered 2017-12-04 – 2017-12-06 (×7): 300 mg via ORAL
  Filled 2017-12-04 (×7): qty 1

## 2017-12-04 MED ORDER — TAMSULOSIN HCL 0.4 MG PO CAPS
0.4000 mg | ORAL_CAPSULE | Freq: Every day | ORAL | Status: DC
  Administered 2017-12-04: 0.4 mg via ORAL
  Filled 2017-12-04 (×2): qty 1

## 2017-12-04 MED ORDER — ONDANSETRON HCL 4 MG/2ML IJ SOLN
INTRAMUSCULAR | Status: AC
  Filled 2017-12-04: qty 2

## 2017-12-04 MED ORDER — ASPIRIN 81 MG PO CHEW
81.0000 mg | CHEWABLE_TABLET | Freq: Two times a day (BID) | ORAL | Status: DC
  Administered 2017-12-04 – 2017-12-06 (×4): 81 mg via ORAL
  Filled 2017-12-04 (×4): qty 1

## 2017-12-04 MED ORDER — SODIUM CHLORIDE 0.9 % IV SOLN
INTRAVENOUS | Status: DC
  Administered 2017-12-04: 16:00:00 via INTRAVENOUS

## 2017-12-04 MED ORDER — SIMVASTATIN 20 MG PO TABS
20.0000 mg | ORAL_TABLET | Freq: Every day | ORAL | Status: DC
  Administered 2017-12-04 – 2017-12-05 (×2): 20 mg via ORAL
  Filled 2017-12-04 (×2): qty 1

## 2017-12-04 MED ORDER — ACETAMINOPHEN 325 MG PO TABS
325.0000 mg | ORAL_TABLET | Freq: Four times a day (QID) | ORAL | Status: DC | PRN
  Administered 2017-12-05: 650 mg via ORAL
  Filled 2017-12-04: qty 2

## 2017-12-04 MED ORDER — DEXAMETHASONE SODIUM PHOSPHATE 10 MG/ML IJ SOLN
INTRAMUSCULAR | Status: AC
  Filled 2017-12-04: qty 1

## 2017-12-04 MED ORDER — OXYCODONE HCL 5 MG PO TABS: 5.0000 mg | ORAL_TABLET | ORAL | 0 refills | Status: DC | PRN

## 2017-12-04 MED ORDER — PROPOFOL 10 MG/ML IV BOLUS
INTRAVENOUS | Status: AC
  Filled 2017-12-04: qty 20

## 2017-12-04 MED ORDER — TRANEXAMIC ACID 1000 MG/10ML IV SOLN
1000.0000 mg | Freq: Once | INTRAVENOUS | Status: AC
  Administered 2017-12-04: 1000 mg via INTRAVENOUS
  Filled 2017-12-04: qty 10

## 2017-12-04 MED ORDER — ALUM & MAG HYDROXIDE-SIMETH 200-200-20 MG/5ML PO SUSP: 30.0000 mL | ORAL | Status: DC | PRN

## 2017-12-04 MED ORDER — LISINOPRIL 10 MG PO TABS
10.0000 mg | ORAL_TABLET | Freq: Every day | ORAL | Status: DC
  Administered 2017-12-04 – 2017-12-06 (×3): 10 mg via ORAL
  Filled 2017-12-04 (×3): qty 1

## 2017-12-04 MED ORDER — SODIUM CHLORIDE 0.9 % IR SOLN
Status: DC | PRN
  Administered 2017-12-04: 3000 mL

## 2017-12-04 MED ORDER — HYDROCHLOROTHIAZIDE 12.5 MG PO CAPS
12.5000 mg | ORAL_CAPSULE | Freq: Every day | ORAL | Status: DC
  Administered 2017-12-05 – 2017-12-06 (×2): 12.5 mg via ORAL
  Filled 2017-12-04 (×2): qty 1

## 2017-12-04 MED ORDER — METHOCARBAMOL 750 MG PO TABS: 750.0000 mg | ORAL_TABLET | Freq: Two times a day (BID) | ORAL | 0 refills | Status: DC | PRN

## 2017-12-04 MED ORDER — OXYCODONE HCL 5 MG/5ML PO SOLN: 5.0000 mg | Freq: Once | ORAL | Status: DC | PRN

## 2017-12-04 MED ORDER — MIDAZOLAM HCL 2 MG/2ML IJ SOLN
INTRAMUSCULAR | Status: AC
  Administered 2017-12-04: 2 mg via INTRAVENOUS
  Filled 2017-12-04: qty 2

## 2017-12-04 MED ORDER — OXYCODONE HCL ER 10 MG PO T12A: 10.0000 mg | EXTENDED_RELEASE_TABLET | Freq: Two times a day (BID) | ORAL | 0 refills | Status: AC

## 2017-12-04 MED ORDER — METOCLOPRAMIDE HCL 5 MG PO TABS: 5.0000 mg | ORAL_TABLET | Freq: Three times a day (TID) | ORAL | Status: DC | PRN

## 2017-12-04 MED ORDER — SULFAMETHOXAZOLE-TRIMETHOPRIM 800-160 MG PO TABS: 1.0000 | ORAL_TABLET | Freq: Two times a day (BID) | ORAL | 0 refills | Status: DC

## 2017-12-04 MED ORDER — METHOCARBAMOL 1000 MG/10ML IJ SOLN
500.0000 mg | Freq: Four times a day (QID) | INTRAVENOUS | Status: DC | PRN
  Filled 2017-12-04: qty 5

## 2017-12-04 MED ORDER — CHLORHEXIDINE GLUCONATE 4 % EX LIQD: 60.0000 mL | Freq: Once | CUTANEOUS | Status: DC

## 2017-12-04 MED ORDER — PANTOPRAZOLE SODIUM 40 MG PO TBEC
40.0000 mg | DELAYED_RELEASE_TABLET | Freq: Every day | ORAL | Status: DC
  Administered 2017-12-04 – 2017-12-06 (×3): 40 mg via ORAL
  Filled 2017-12-04 (×3): qty 1

## 2017-12-04 MED ORDER — ONDANSETRON HCL 4 MG/2ML IJ SOLN
INTRAMUSCULAR | Status: DC | PRN
  Administered 2017-12-04: 4 mg via INTRAVENOUS

## 2017-12-04 MED ORDER — ZOLPIDEM TARTRATE 5 MG PO TABS
5.0000 mg | ORAL_TABLET | Freq: Every day | ORAL | Status: DC
  Administered 2017-12-04 – 2017-12-05 (×2): 5 mg via ORAL
  Filled 2017-12-04 (×2): qty 1

## 2017-12-04 MED ORDER — INFLUENZA VAC SPLIT HIGH-DOSE 0.5 ML IM SUSY
0.5000 mL | PREFILLED_SYRINGE | INTRAMUSCULAR | Status: AC
  Administered 2017-12-06: 0.5 mL via INTRAMUSCULAR
  Filled 2017-12-04: qty 0.5

## 2017-12-04 MED ORDER — DEXAMETHASONE SODIUM PHOSPHATE 10 MG/ML IJ SOLN
10.0000 mg | Freq: Once | INTRAMUSCULAR | Status: AC
  Administered 2017-12-05: 10 mg via INTRAVENOUS
  Filled 2017-12-04: qty 1

## 2017-12-04 MED ORDER — LEVOTHYROXINE SODIUM 75 MCG PO TABS
150.0000 ug | ORAL_TABLET | Freq: Every day | ORAL | Status: DC
  Administered 2017-12-05 – 2017-12-06 (×2): 150 ug via ORAL
  Filled 2017-12-04 (×2): qty 2

## 2017-12-04 MED ORDER — LISINOPRIL-HYDROCHLOROTHIAZIDE 10-12.5 MG PO TABS: 1.0000 | ORAL_TABLET | Freq: Every day | ORAL | Status: DC

## 2017-12-04 MED ORDER — PHENAZOPYRIDINE HCL 200 MG PO TABS
200.0000 mg | ORAL_TABLET | Freq: Three times a day (TID) | ORAL | Status: DC | PRN
  Filled 2017-12-04: qty 1

## 2017-12-04 MED ORDER — VANCOMYCIN HCL 1000 MG IV SOLR
INTRAVENOUS | Status: DC | PRN
  Administered 2017-12-04: 1000 mg via TOPICAL

## 2017-12-04 MED ORDER — FENTANYL CITRATE (PF) 100 MCG/2ML IJ SOLN: 25.0000 ug | INTRAMUSCULAR | Status: DC | PRN

## 2017-12-04 MED ORDER — OXYCODONE HCL ER 10 MG PO T12A
10.0000 mg | EXTENDED_RELEASE_TABLET | Freq: Two times a day (BID) | ORAL | Status: DC
  Administered 2017-12-04 – 2017-12-06 (×5): 10 mg via ORAL
  Filled 2017-12-04 (×5): qty 1

## 2017-12-04 MED ORDER — ONDANSETRON HCL 4 MG PO TABS: 4.0000 mg | ORAL_TABLET | Freq: Three times a day (TID) | ORAL | 0 refills | Status: DC | PRN

## 2017-12-04 MED ORDER — DIPHENHYDRAMINE HCL 12.5 MG/5ML PO ELIX: 25.0000 mg | ORAL_SOLUTION | ORAL | Status: DC | PRN

## 2017-12-04 MED ORDER — POLYETHYLENE GLYCOL 3350 17 G PO PACK: 17.0000 g | PACK | Freq: Every day | ORAL | Status: DC | PRN

## 2017-12-04 MED ORDER — OXYCODONE HCL 5 MG PO TABS
10.0000 mg | ORAL_TABLET | ORAL | Status: DC | PRN
  Administered 2017-12-05: 10 mg via ORAL
  Filled 2017-12-04 (×2): qty 2

## 2017-12-04 MED ORDER — ASPIRIN EC 81 MG PO TBEC: 81.0000 mg | DELAYED_RELEASE_TABLET | Freq: Two times a day (BID) | ORAL | 0 refills | Status: DC

## 2017-12-04 MED ORDER — CEFAZOLIN SODIUM-DEXTROSE 2-4 GM/100ML-% IV SOLN
2.0000 g | Freq: Four times a day (QID) | INTRAVENOUS | Status: AC
  Administered 2017-12-04 – 2017-12-05 (×3): 2 g via INTRAVENOUS
  Filled 2017-12-04 (×4): qty 100

## 2017-12-04 MED ORDER — PROPOFOL 500 MG/50ML IV EMUL
INTRAVENOUS | Status: DC | PRN
  Administered 2017-12-04: 50 ug/kg/min via INTRAVENOUS

## 2017-12-04 SURGICAL SUPPLY — 81 items
ALCOHOL ISOPROPYL (RUBBING) (MISCELLANEOUS) ×3 IMPLANT
BAG DECANTER FOR FLEXI CONT (MISCELLANEOUS) ×3 IMPLANT
BANDAGE ACE 6X5 VEL STRL LF (GAUZE/BANDAGES/DRESSINGS) ×2 IMPLANT
BANDAGE ESMARK 6X9 LF (GAUZE/BANDAGES/DRESSINGS) ×1 IMPLANT
BLADE SAW SGTL 13.0X1.19X90.0M (BLADE) ×3 IMPLANT
BNDG CMPR 9X6 STRL LF SNTH (GAUZE/BANDAGES/DRESSINGS) ×1
BNDG CMPR MED 10X6 ELC LF (GAUZE/BANDAGES/DRESSINGS) ×1
BNDG ELASTIC 6X10 VLCR STRL LF (GAUZE/BANDAGES/DRESSINGS) ×3 IMPLANT
BNDG ESMARK 6X9 LF (GAUZE/BANDAGES/DRESSINGS) ×3
BOWL SMART MIX CTS (DISPOSABLE) ×3 IMPLANT
BSPLAT TIB 4 CMNT M TPR KN RT (Knees) ×1 IMPLANT
CEMENT BONE R 1X40 (Cement) ×4 IMPLANT
CLOSURE STERI-STRIP 1/2X4 (GAUZE/BANDAGES/DRESSINGS) ×2
CLOSURE WOUND 1/2 X4 (GAUZE/BANDAGES/DRESSINGS) ×1
CLSR STERI-STRIP ANTIMIC 1/2X4 (GAUZE/BANDAGES/DRESSINGS) ×4 IMPLANT
COMPONENT TIBIA RIGHT SZ 4 (Knees) IMPLANT
COVER SURGICAL LIGHT HANDLE (MISCELLANEOUS) ×3 IMPLANT
CUFF TOURNIQUET SINGLE 34IN LL (TOURNIQUET CUFF) ×3 IMPLANT
CUFF TOURNIQUET SINGLE 44IN (TOURNIQUET CUFF) IMPLANT
DRAPE EXTREMITY T 121X128X90 (DRAPE) ×3 IMPLANT
DRAPE HALF SHEET 40X57 (DRAPES) ×3 IMPLANT
DRAPE INCISE IOBAN 66X45 STRL (DRAPES) IMPLANT
DRAPE ORTHO SPLIT 77X108 STRL (DRAPES) ×6
DRAPE POUCH INSTRU U-SHP 10X18 (DRAPES) ×3 IMPLANT
DRAPE SURG ORHT 6 SPLT 77X108 (DRAPES) ×2 IMPLANT
DRAPE U-SHAPE 47X51 STRL (DRAPES) ×6 IMPLANT
DURAPREP 26ML APPLICATOR (WOUND CARE) ×6 IMPLANT
ELECT CAUTERY BLADE 6.4 (BLADE) ×3 IMPLANT
ELECT REM PT RETURN 9FT ADLT (ELECTROSURGICAL) ×3
ELECTRODE REM PT RTRN 9FT ADLT (ELECTROSURGICAL) ×1 IMPLANT
FEMUR OXINIUM SZ 5 RT (Knees) ×2 IMPLANT
GAUZE SPONGE 4X4 12PLY STRL (GAUZE/BANDAGES/DRESSINGS) ×2 IMPLANT
GAUZE SPONGE 4X4 12PLY STRL LF (GAUZE/BANDAGES/DRESSINGS) ×3 IMPLANT
GLOVE BIOGEL PI IND STRL 7.0 (GLOVE) ×1 IMPLANT
GLOVE BIOGEL PI INDICATOR 7.0 (GLOVE) ×2
GLOVE ECLIPSE 7.0 STRL STRAW (GLOVE) ×9 IMPLANT
GLOVE SKINSENSE NS SZ7.5 (GLOVE) ×2
GLOVE SKINSENSE STRL SZ7.5 (GLOVE) ×1 IMPLANT
GLOVE SURG SYN 7.5  E (GLOVE) ×8
GLOVE SURG SYN 7.5 E (GLOVE) ×4 IMPLANT
GLOVE SURG SYN 7.5 PF PI (GLOVE) ×4 IMPLANT
GOWN STRL REIN XL XLG (GOWN DISPOSABLE) ×3 IMPLANT
GOWN STRL REUS W/ TWL LRG LVL3 (GOWN DISPOSABLE) ×1 IMPLANT
GOWN STRL REUS W/TWL LRG LVL3 (GOWN DISPOSABLE) ×3
HANDPIECE INTERPULSE COAX TIP (DISPOSABLE) ×3
HOOD PEEL AWAY FLYTE STAYCOOL (MISCELLANEOUS) ×6 IMPLANT
INSERT XLPE 11MM SZ 3-4 (Knees) ×2 IMPLANT
KIT BASIN OR (CUSTOM PROCEDURE TRAY) ×3 IMPLANT
KIT TURNOVER KIT B (KITS) ×3 IMPLANT
MANIFOLD NEPTUNE II (INSTRUMENTS) ×3 IMPLANT
MARKER SKIN DUAL TIP RULER LAB (MISCELLANEOUS) ×3 IMPLANT
NDL SPNL 18GX3.5 QUINCKE PK (NEEDLE) ×2 IMPLANT
NEEDLE SPNL 18GX3.5 QUINCKE PK (NEEDLE) ×6 IMPLANT
NS IRRIG 1000ML POUR BTL (IV SOLUTION) ×3 IMPLANT
PACK TOTAL JOINT (CUSTOM PROCEDURE TRAY) ×3 IMPLANT
PAD ABD 8X10 STRL (GAUZE/BANDAGES/DRESSINGS) ×4 IMPLANT
PAD ARMBOARD 7.5X6 YLW CONV (MISCELLANEOUS) ×6 IMPLANT
PAD CAST 4YDX4 CTTN HI CHSV (CAST SUPPLIES) ×2 IMPLANT
PADDING CAST COTTON 4X4 STRL (CAST SUPPLIES) ×6
PADDING CAST COTTON 6X4 STRL (CAST SUPPLIES) ×3 IMPLANT
PATELLA RESURF GEN II 32MM (Orthopedic Implant) ×2 IMPLANT
SAW OSC TIP CART 19.5X105X1.3 (SAW) ×3 IMPLANT
SET HNDPC FAN SPRY TIP SCT (DISPOSABLE) ×1 IMPLANT
STAPLER VISISTAT 35W (STAPLE) IMPLANT
STRIP CLOSURE SKIN 1/2X4 (GAUZE/BANDAGES/DRESSINGS) ×1 IMPLANT
SUCTION FRAZIER HANDLE 10FR (MISCELLANEOUS) ×2
SUCTION TUBE FRAZIER 10FR DISP (MISCELLANEOUS) ×1 IMPLANT
SUT ETHILON 2 0 FS 18 (SUTURE) IMPLANT
SUT MNCRL AB 4-0 PS2 18 (SUTURE) IMPLANT
SUT VIC AB 0 CT1 27 (SUTURE) ×6
SUT VIC AB 0 CT1 27XBRD ANBCTR (SUTURE) ×2 IMPLANT
SUT VIC AB 1 CTX 27 (SUTURE) ×9 IMPLANT
SUT VIC AB 2-0 CT1 27 (SUTURE) ×12
SUT VIC AB 2-0 CT1 TAPERPNT 27 (SUTURE) ×3 IMPLANT
SYR 50ML LL SCALE MARK (SYRINGE) ×6 IMPLANT
TIBIA RIGHT SZ 4 (Knees) ×3 IMPLANT
TOWEL OR 17X24 6PK STRL BLUE (TOWEL DISPOSABLE) ×3 IMPLANT
TOWEL OR 17X26 10 PK STRL BLUE (TOWEL DISPOSABLE) ×3 IMPLANT
TRAY CATH 16FR W/PLASTIC CATH (SET/KITS/TRAYS/PACK) IMPLANT
UNDERPAD 30X30 (UNDERPADS AND DIAPERS) ×3 IMPLANT
WRAP KNEE MAXI GEL POST OP (GAUZE/BANDAGES/DRESSINGS) ×3 IMPLANT

## 2017-12-04 NOTE — H&P (Signed)
PREOPERATIVE H&P  Chief Complaint: right knee degenerative joint disease  HPI: Kelly Perkins is a 67 y.o. female who presents for surgical treatment of right knee degenerative joint disease.  She denies any changes in medical history.  Past Medical History:  Diagnosis Date  . GERD (gastroesophageal reflux disease)   . History of kidney stones   . History of lithotripsy    urolthiasis 2004  . Hyperlipidemia 09/10/2010  . Hypertension    no meds  . Hypothyroidism   . Kidney stones   . Normal cardiac stress test 6-12   had CP-DOE  . Osteoarthritis   . Wears dentures    top  . Wears glasses    Past Surgical History:  Procedure Laterality Date  . COLONOSCOPY     [id number]  . CYSTOSCOPY/URETEROSCOPY/HOLMIUM LASER/STENT PLACEMENT Right 11/17/2017   Procedure: CYSTOSCOPY/RIGHT URETEROSCOPY/RIGHT HOLMIUM LASER/STENT PLACEMENT;  Surgeon: Ceasar Mons, MD;  Location: [facility];  Service: Urology;  Laterality: Right;  . hysterectomy (unknown)  2000   B oophoreectomy, d/t DUB  . MASS EXCISION  09/12/2011   Procedure: EXCISION MASS;  Surgeon: Cristine Polio, MD;  Location: [place];  Service: Plastics;  Laterality: Left;  left ring finger  . POLYPECTOMY     TA 2004  . THYROIDECTOMY    . TOTAL KNEE ARTHROPLASTY Left 11/17/2016   Procedure: LEFT TOTAL KNEE ARTHROPLASTY;  Surgeon: Leandrew Koyanagi, MD;  Location: [place];  Service: Orthopedics;  Laterality: Left;   Social History   Socioeconomic History  . Marital status: Single    Spouse name: Not on file  . Number of children: 2  . Years of education: Not on file  . Highest education level: Not on file  Occupational History  . Occupation: school bus driver. Retired-- [employer]  . Financial resource strain: Not on file  . Food insecurity:    Worry: Not on file    Inability: Not on file  . Transportation needs:    Medical: Not on file    Non-medical: Not on file  Tobacco Use  . Smoking  status: Never Smoker  . Smokeless tobacco: Never Used  Substance and Sexual Activity  . Alcohol use: Yes    Alcohol/week: 0.0 standard drinks    Comment: socially  . Drug use: No  . Sexual activity: Never  Lifestyle  . Physical activity:    Days per week: Not on file    Minutes per session: Not on file  . Stress: Not on file  Relationships  . Social connections:    Talks on phone: Not on file    Gets together: Not on file    Attends religious service: Not on file    Active member of club or organization: Not on file    Attends meetings of clubs or organizations: Not on file    Relationship status: Not on file  Other Topics Concern  . Not on file  Social History Narrative   Single, mom is one of my patients [name].        Family History  Problem Relation Age of Onset  . Hypertension Mother   . Coronary artery disease Mother        onset?  Marland Kitchen Hypertension Sister   . Colon polyps Other        niece  . Diabetes Neg Hx   . Colon cancer Neg Hx   . Breast cancer Neg Hx   . Stroke  Neg Hx   . Rectal cancer Neg Hx   . Stomach cancer Neg Hx    No Known Allergies Prior to Admission medications   Medication Sig Start Date End Date Taking? Authorizing Provider  cyclobenzaprine (FLEXERIL) 10 MG tablet TAKE 1 TABLET (10 MG TOTAL) BY MOUTH AT BEDTIME AS NEEDED FOR MUSCLE SPASMS. 08/18/17   Colon Branch, MD  diclofenac sodium (VOLTAREN) 1 % GEL Apply 2 g topically 4 (four) times daily. Patient not taking: Reported on 11/17/2017 05/11/17   Leandrew Koyanagi, MD  levothyroxine (SYNTHROID, LEVOTHROID) 150 MCG tablet Take 1 tablet (150 mcg total) by mouth daily before breakfast. 05/26/17   Colon Branch, MD  lisinopril-hydrochlorothiazide (PRINZIDE,ZESTORETIC) 10-12.5 MG tablet Take 1 tablet by mouth daily. 11/16/17   Colon Branch, MD  meloxicam (MOBIC) 7.5 MG tablet Take 1 tablet (7.5 mg total) by mouth 2 (two) times daily as needed for pain. Patient not taking: Reported on 07/03/2017 05/11/17    Leandrew Koyanagi, MD  methocarbamol (ROBAXIN) 750 MG tablet TAKE 1 TABLET TWICE A DAY AS NEEDED FOR MUSCLE SPASMS Patient not taking: Reported on 11/17/2017 04/24/17   Leandrew Koyanagi, MD  ondansetron (ZOFRAN ODT) 4 MG disintegrating tablet Take 1 tablet (4 mg total) by mouth every 8 (eight) hours as needed for nausea. 11/17/17   Larene Pickett, PA-C  ondansetron (ZOFRAN) 4 MG tablet Take 1 tablet (4 mg total) by mouth daily as needed for nausea or vomiting. 11/17/17 11/17/18  Ceasar Mons, MD  oxyCODONE-acetaminophen (PERCOCET) 5-325 MG tablet Take 1 tablet by mouth every 4 (four) hours as needed. 11/17/17   Ceasar Mons, MD  pantoprazole (PROTONIX) 40 MG tablet Take 1 tablet (40 mg total) by mouth daily. 11/17/17   Colon Branch, MD  phenazopyridine (PYRIDIUM) 200 MG tablet Take 1 tablet (200 mg total) by mouth 3 (three) times daily as needed (for pain with urination). 11/17/17 11/17/18  Ceasar Mons, MD  simvastatin (ZOCOR) 20 MG tablet Take 1 tablet (20 mg total) by mouth at bedtime. 11/16/17   Colon Branch, MD  tamsulosin (FLOMAX) 0.4 MG CAPS capsule Take 1 capsule (0.4 mg total) by mouth daily after supper. 11/17/17   Larene Pickett, PA-C  traMADol (ULTRAM) 50 MG tablet TAKE 1-2 TABLETS TWICE DAILY AS NEEDED FOR PAIN Patient not taking: Reported on 11/17/2017 04/24/17   Leandrew Koyanagi, MD  zolpidem (AMBIEN) 10 MG tablet TAKE 1 TABLET BY MOUTH EVERYDAY AT BEDTIME Patient taking differently: Take 10 mg by mouth at bedtime.  08/22/17   Colon Branch, MD     Positive ROS: All other systems have been reviewed and were otherwise negative with the exception of those mentioned in the HPI and as above.  Physical Exam: General: Alert, no acute distress Cardiovascular: No pedal edema Respiratory: No cyanosis, no use of accessory musculature GI: abdomen soft Skin: No lesions in the area of chief complaint Neurologic: Sensation intact distally Psychiatric: Patient is competent for consent  with normal mood and affect Lymphatic: no lymphedema  MUSCULOSKELETAL: exam stable  Assessment: right knee degenerative joint disease  Plan: Plan for Procedure(s): RIGHT TOTAL KNEE ARTHROPLASTY  The risks benefits and alternatives were discussed with the patient including but not limited to the risks of nonoperative treatment, versus surgical intervention including infection, bleeding, nerve injury,  blood clots, cardiopulmonary complications, morbidity, mortality, among others, and they were willing to proceed.   Preoperative templating of the joint replacement has been  completed, documented, and submitted to the Operating Room personnel in order to optimize intra-operative equipment management.  Anticipated LOS equal to or greater than 2 midnights due to - Age 17 and older with one or more of the following:  - Obesity  - Expected need for hospital services (PT, OT, Nursing) required for safe  discharge  - Anticipated need for postoperative skilled nursing care or inpatient rehab  - Active co-morbidities: None   Eduard Roux, MD   12/04/2017 7:21 AM

## 2017-12-04 NOTE — Progress Notes (Signed)
Orthopedic Tech Progress Note Patient Details:  Kelly Perkins [birth year] [id number]  CPM Right Knee CPM Right Knee: On Right Knee Flexion (Degrees): 90 Right Knee Extension (Degrees): 0 Additional Comments: trapeze bar patient helper viewed order from doctor's order list  Post Interventions Patient Tolerated: Well Instructions Provided: Care of device  Hildred Priest 12/04/2017, 12:31 PM

## 2017-12-04 NOTE — Anesthesia Procedure Notes (Signed)
Spinal  Patient location during procedure: OR Start time: 12/04/2017 9:40 AM End time: 12/04/2017 9:55 AM Staffing Anesthesiologist: Belinda Block, MD Performed: anesthesiologist  Preanesthetic Checklist Completed: patient identified, site marked, surgical consent, pre-op evaluation, IV checked, risks and benefits discussed and monitors and equipment checked Spinal Block Patient position: sitting Patient monitoring: heart rate, cardiac monitor, continuous pulse ox and blood pressure Location: L3-4 Injection technique: single-shot Needle Needle type: Quincke  Needle gauge: 24 G Assessment Sensory level: T10 Additional Notes Clear CSF. Spinal marcaine 2cc given. Patient tolerated procedure well. Exp checked. No heme asp. Patient tolerated procedure well

## 2017-12-04 NOTE — Discharge Instructions (Signed)

## 2017-12-04 NOTE — Transfer of Care (Signed)
Immediate Anesthesia Transfer of Care Note  Patient: Kelly Perkins  Procedure(s) Performed: RIGHT TOTAL KNEE ARTHROPLASTY (Right Knee)  Patient Location: PACU  Anesthesia Type:Regional and Spinal  Level of Consciousness: awake, alert , oriented, drowsy and patient cooperative  Airway & Oxygen Therapy: Patient Spontanous Breathing and Patient connected to nasal cannula oxygen  Post-op Assessment: Report given to RN and Post -op Vital signs reviewed and stable  Post vital signs: Reviewed and stable  Last Vitals:  Vitals Value Taken Time  BP    Temp    Pulse 83 12/04/2017 12:06 PM  Resp    SpO2 100 % 12/04/2017 12:06 PM  Vitals shown include unvalidated device data.  Last Pain:  Vitals:   12/04/17 0809  TempSrc:   PainSc: 0-No pain      Patients Stated Pain Goal: 0 (13/88/71 9597)  Complications: No apparent anesthesia complications

## 2017-12-04 NOTE — Anesthesia Preprocedure Evaluation (Signed)
Anesthesia Evaluation  Patient identified by MRN, date of birth, ID band Patient awake    Reviewed: Allergy & Precautions, NPO status , Patient's Chart, lab work & pertinent test results  Airway Mallampati: II  TM Distance: >3 FB     Dental   Pulmonary neg pulmonary ROS,    breath sounds clear to auscultation       Cardiovascular hypertension,  Rhythm:Regular Rate:Normal     Neuro/Psych    GI/Hepatic GERD  ,  Endo/Other  Hypothyroidism   Renal/GU Renal disease     Musculoskeletal  (+) Arthritis ,   Abdominal   Peds  Hematology  (+) anemia ,   Anesthesia Other Findings   Reproductive/Obstetrics                             Anesthesia Physical Anesthesia Plan  ASA: III  Anesthesia Plan: Spinal   Post-op Pain Management:    Induction: Intravenous  PONV Risk Score and Plan: Treatment may vary due to age or medical condition, Ondansetron, Dexamethasone and Midazolam  Airway Management Planned: Simple Face Mask and Nasal Cannula  Additional Equipment:   Intra-op Plan:   Post-operative Plan:   Informed Consent: I have reviewed the patients History and Physical, chart, labs and discussed the procedure including the risks, benefits and alternatives for the proposed anesthesia with the patient or authorized representative who has indicated his/her understanding and acceptance.   Dental advisory given  Plan Discussed with:   Anesthesia Plan Comments:         Anesthesia Quick Evaluation

## 2017-12-04 NOTE — Op Note (Signed)
Total Knee Arthroplasty Procedure Note  Preoperative diagnosis: Right knee osteoarthritis  Postoperative diagnosis:same  Operative procedure: Right total knee arthroplasty. CPT [phone]  Surgeon: N. Eduard Roux, MD  Assist: Madalyn Rob, PA-C; necessary for the timely completion of procedure and due to complexity of procedure.  Anesthesia: Spinal, regional  Tourniquet time: 60 mins  Implants used: [employer] Femur: PS 5 Tibia: 4 Patella: 32 mm Polyethylene: 11 mm  Indication: Kelly Perkins is a 67 y.o. year old female with a history of knee pain. Having failed conservative management, the patient elected to proceed with a total knee arthroplasty.  We have reviewed the risk and benefits of the surgery and they elected to proceed after voicing understanding.  Procedure:  After informed consent was obtained and understanding of the risk were voiced including but not limited to bleeding, infection, damage to surrounding structures including nerves and vessels, blood clots, leg length inequality and the failure to achieve desired results, the operative extremity was marked with verbal confirmation of the patient in the holding area.   The patient was then brought to the operating room and transported to the operating room table in the supine position.  A tourniquet was applied to the operative extremity around the upper thigh. The operative limb was then prepped and draped in the usual sterile fashion and preoperative antibiotics were administered.  A time out was performed prior to the start of surgery confirming the correct extremity, preoperative antibiotic administration, as well as team members, implants and instruments available for the case. Correct surgical site was also confirmed with preoperative radiographs. The limb was then elevated for exsanguination and the tourniquet was inflated. A midline incision was made and a standard medial parapatellar approach was  performed.  The patella was prepared and sized to a 32 mm.  A cover was placed on the patella for protection from retractors.  We then turned our attention to the femur. Posterior cruciate ligament was sacrificed. Start site was drilled in the femur and the intramedullary distal femoral cutting guide was placed, set at 5 degrees valgus, taking 9 mm of distal resection. The distal cut was made. Osteophytes were then removed. Next, the proximal tibial cutting guide was placed with appropriate slope, varus/valgus alignment and depth of resection. The proximal tibial cut was made. Gap blocks were then used to assess the extension gap and alignment, and appropriate soft tissue releases were performed. Attention was turned back to the femur, which was sized using the sizing guide to a size 5. Appropriate rotation of the femoral component was determined using epicondylar axis, Whiteside's line, and assessing the flexion gap under ligament tension. The appropriate size 4-in-1 cutting block was placed and cuts were made. Posterior femoral osteophytes and uncapped bone were then removed with the curved osteotome. The tibia was sized for a size 4 component. The femoral box-cutting guide was placed and prepared for a PS femoral component. Trial components were placed, and stability was checked in full extension, mid-flexion, and deep flexion. Proper tibial rotation was determined and marked.  The patella tracked well without a lateral release. Trial components were then removed and tibial preparation performed. A posterior capsular injection comprising of 20 cc of 1.3% exparel and 40 cc of normal saline was performed for postoperative pain control. The bony surfaces were irrigated with a pulse lavage and then dried. Bone cement was vacuum mixed on the back table, and the final components sized above were cemented into place. After cement  had finished curing, excess cement was removed. The stability of the construct was  re-evaluated throughout a range of motion and found to be acceptable. The trial liner was removed, the knee was copiously irrigated, and the knee was re-evaluated for any excess bone debris. The real polyethylene liner, 11 mm thick, was inserted and checked to ensure the locking mechanism had engaged appropriately. The tourniquet was deflated and hemostasis was achieved. The wound was irrigated with normal saline.  One gram of vancomycin powder was placed in the surgical bed. A drain was not placed. Capsular closure was performed with a #1 vicryl, subcutaneous fat closed with a 0 vicryl suture, then subcutaneous tissue closed with interrupted 2.0 vicryl suture. The skin was then closed with a 3.0 monocryl. A sterile dressing was applied.  The patient was awakened in the operating room and taken to recovery in stable condition. All sponge, needle, and instrument counts were correct at the end of the case.  Position: supine  Complications: none.  Time Out: performed   Drains/Packing: none  Estimated blood loss: minimal  Returned to Recovery Room: in good condition.   Antibiotics: yes   Mechanical VTE (DVT) Prophylaxis: sequential compression devices, TED thigh-high  Chemical VTE (DVT) Prophylaxis: aspirin  Fluid Replacement  Crystalloid: see anesthesia record Blood: none  FFP: none   Specimens Removed: 1 to pathology   Sponge and Instrument Count Correct? yes   PACU: portable radiograph - knee AP and Lateral   Admission: inpatient status  Plan/RTC: Return in 2 weeks for wound check.   Weight Bearing/Load Lower Extremity: full   N. Eduard Roux, MD [place] 11:25 AM

## 2017-12-04 NOTE — Progress Notes (Signed)
1300 received pt from PACU, restless, c/o of severe right knee pain "10/10". CPM removed. Pain meds given. Will cont to monitor.

## 2017-12-04 NOTE — Evaluation (Signed)
Physical Therapy Evaluation Patient Details Name: Kelly Perkins MRN: [id number] DOB: 13-Dec-1950 Today's Date: 12/04/2017   History of Present Illness  PAtient is a 67 y/o female admitted for R TKA.  PMH positive for OA, HTN, HLD, GERD, kidney stones, thyroidectomy and L TKA.  Clinical Impression  Patient presents with decreased mobility due to pain, decreased AROM/strength R LE and decreased activity tolerance.  She will benefit from skilled PT in the acute setting to allow return home with intermittent assist from family and follow up PT as determined by MD.      Follow Up Recommendations Supervision/Assistance - 24 hour;Follow surgeon's recommendation for DC plan and follow-up therapies    Equipment Recommendations  None recommended by PT    Recommendations for Other Services       Precautions / Restrictions Precautions Precautions: Fall;Knee Restrictions Weight Bearing Restrictions: No Other Position/Activity Restrictions: WBAT      Mobility  Bed Mobility Overal bed mobility: Needs Assistance Bed Mobility: Supine to Sit;Sit to Supine     Supine to sit: Min assist;HOB elevated Sit to supine: Min assist   General bed mobility comments: assist for R LE and cues for technique, increased time due to pain, to supine cues for technique and assist for R LE  Transfers                 General transfer comment: NT due to pain  Ambulation/Gait                Stairs            Wheelchair Mobility    Modified Rankin (Stroke Patients Only)       Balance Overall balance assessment: Needs assistance Sitting-balance support: Feet supported Sitting balance-Leahy Scale: Good Sitting balance - Comments: sat EOB about 5 minutes with feet on floor no UE support                                     Pertinent Vitals/Pain Pain Assessment: 0-10 Pain Score: 9  Pain Location: R knee Pain Descriptors / Indicators: Aching Pain Intervention(s):  Monitored during session;Repositioned    Home Living Family/patient expects to be discharged to:: Private residence Living Arrangements: Alone Available Help at Discharge: Family;Available PRN/intermittently Type of Home: House(townhouse) Home Access: Level entry     Home Layout: Two level;Bed/bath upstairs Home Equipment: Bedside commode;Walker - 2 wheels      Prior Function Level of Independence: Independent               Hand Dominance        Extremity/Trunk Assessment   Upper Extremity Assessment Upper Extremity Assessment: Overall WFL for tasks assessed    Lower Extremity Assessment Lower Extremity Assessment: RLE deficits/detail RLE Deficits / Details: pain with AAROM grossly -10 - 30, strength again limited by pain, ankle AAROM grossly WFL       Communication   Communication: No difficulties  Cognition Arousal/Alertness: Lethargic;Suspect due to medications Behavior During Therapy: [facility] for tasks assessed/performed Overall Cognitive Status: Within Functional Limits for tasks assessed                                        General Comments General comments (skin integrity, edema, etc.): family in room and supportive    Exercises Total Joint Exercises Ankle Circles/Pumps:  AROM;10 reps;AAROM;Both;Supine Quad Sets: AROM;5 reps;Both;Supine Heel Slides: AAROM;5 reps;Right;Supine   Assessment/Plan    PT Assessment Patient needs continued PT services  PT Problem List Decreased range of motion;Decreased strength;Decreased mobility;Decreased activity tolerance;Pain       PT Treatment Interventions DME instruction;Therapeutic activities;Gait training;Therapeutic exercise;Patient/family education;Balance training;Stair training;Functional mobility training    PT Goals (Current goals can be found in the Care Plan section)  Acute Rehab PT Goals Patient Stated Goal: to go home PT Goal Formulation: With patient/family Time For Goal  Achievement: 12/11/17 Potential to Achieve Goals: Good    Frequency 7X/week   Barriers to discharge        Co-evaluation               AM-PAC PT "6 Clicks" Daily Activity  Outcome Measure Difficulty turning over in bed (including adjusting bedclothes, sheets and blankets)?: Unable Difficulty moving from lying on back to sitting on the side of the bed? : Unable Difficulty sitting down on and standing up from a chair with arms (e.g., wheelchair, bedside commode, etc,.)?: Unable Help needed moving to and from a bed to chair (including a wheelchair)?: A Lot Help needed walking in hospital room?: A Lot Help needed climbing 3-5 steps with a railing? : Total 6 Click Score: 8    End of Session   Activity Tolerance: Patient limited by pain Patient left: in bed;with call bell/phone within reach;with family/visitor present   PT Visit Diagnosis: Difficulty in walking, not elsewhere classified (R26.2);Pain Pain - Right/Left: Right Pain - part of body: Knee    Time: 7741-2878 PT Time Calculation (min) (ACUTE ONLY): 33 min   Charges:   PT Evaluation $PT Eval Moderate Complexity: 1 Mod PT Treatments $Therapeutic Activity: 8-22 mins        Magda Kiel, [place] [facility] [phone] 12/04/2017   Reginia Naas 12/04/2017, 4:47 PM

## 2017-12-04 NOTE — Anesthesia Postprocedure Evaluation (Signed)
Anesthesia Post Note  Patient: Melburn Popper  Procedure(s) Performed: RIGHT TOTAL KNEE ARTHROPLASTY (Right Knee)     Patient location during evaluation: PACU Anesthesia Type: Spinal Level of consciousness: awake Pain management: pain level controlled Vital Signs Assessment: post-procedure vital signs reviewed and stable Respiratory status: spontaneous breathing Cardiovascular status: stable Postop Assessment: spinal receding Anesthetic complications: no    Last Vitals:  Vitals:   12/04/17 1250 12/04/17 1313  BP:  132/80  Pulse: 81 75  Resp: 16 17  Temp:  37 C  SpO2: 98% 99%    Last Pain:  Vitals:   12/04/17 1643  TempSrc:   PainSc: 9                  Sharmaine Bain

## 2017-12-05 ENCOUNTER — Encounter (HOSPITAL_COMMUNITY): Payer: Self-pay | Admitting: Orthopaedic Surgery

## 2017-12-05 LAB — CBC
HEMATOCRIT: 30.9 % — AB (ref 36.0–46.0)
Hemoglobin: 9.8 g/dL — ABNORMAL LOW (ref 12.0–15.0)
MCH: 29.5 pg (ref 26.0–34.0)
MCHC: 31.7 g/dL (ref 30.0–36.0)
MCV: 93.1 fL (ref 78.0–100.0)
PLATELETS: 195 10*3/uL (ref 150–400)
RBC: 3.32 MIL/uL — ABNORMAL LOW (ref 3.87–5.11)
RDW: 13.2 % (ref 11.5–15.5)
WBC: 6.8 10*3/uL (ref 4.0–10.5)

## 2017-12-05 LAB — BASIC METABOLIC PANEL
ANION GAP: 9 (ref 5–15)
BUN: 12 mg/dL (ref 8–23)
CALCIUM: 8.4 mg/dL — AB (ref 8.9–10.3)
CO2: 24 mmol/L (ref 22–32)
CREATININE: 0.79 mg/dL (ref 0.44–1.00)
Chloride: 107 mmol/L (ref 98–111)
GFR calc Af Amer: >60 mL/min (ref >60)
GLUCOSE: 135 mg/dL — AB (ref 70–99)
Potassium: 3.5 mmol/L (ref 3.5–5.1)
Sodium: 140 mmol/L (ref 135–145)

## 2017-12-05 NOTE — Progress Notes (Signed)
12/05/2017 Pt is POD #1 and this is her second session.  We were able to start stair training, but she will need reinforcement of correct LE sequencing on the stairs.  She also needs to review last three exercises (seated) in her HEP packet before d/c in the AM.   Wells Guiles B. Tamarius Rosenfield, PT, DPT  Acute Rehabilitation [phone] pager [phone] office      12/05/17 1703  PT Visit Information  Last PT Received On 12/05/17  Assistance Needed +1  History of Present Illness Patient is a 67 y/o female admitted for R TKA on 12/04/17.  PMH positive for OA, HTN, and L TKA.  Subjective Data  Patient Stated Goal to go home  Precautions  Precautions Knee  Precaution Booklet Issued Yes (comment)  Precaution Comments knee exercise handout given and reviewed as well as knee precaution  Restrictions  Other Position/Activity Restrictions WBAT  Pain Assessment  Pain Assessment 0-10  Pain Score 8  Pain Location R knee  Pain Descriptors / Indicators Grimacing;Guarding  Pain Intervention(s) Limited activity within patient's tolerance;Monitored during session;Repositioned;Ice applied  Cognition  Arousal/Alertness Awake/alert  Behavior During Therapy WFL for tasks assessed/performed  Overall Cognitive Status Within Functional Limits for tasks assessed  Bed Mobility  Overal bed mobility Needs Assistance  Bed Mobility Sit to Supine  Sit to supine Min assist  General bed mobility comments Min assist to help lift her right leg back into bed to return to supine.   Transfers  Overall transfer level Needs assistance  Equipment used Rolling walker (2 wheeled)  Transfers Sit to/from Stand  Sit to Stand Min guard  General transfer comment Min guard assist for safety.   Ambulation/Gait  Ambulation/Gait assistance Min guard  Gait Distance (Feet) 200 Feet  Assistive device Rolling walker (2 wheeled)  Gait Pattern/deviations Step-through pattern;Antalgic  General Gait Details Pt with moderately  antalgic stiff legged gait pattern, cues to stay closer to RW and for heel to toe gait pattern.  RW lowered down to better fit her height.   Stairs Yes  Stairs assistance Min assist;Min guard  Stair Management Two rails;Step to pattern;Forwards;One rail Right  Number of Stairs 2 (x2)  General stair comments Started with two rails just so pt could feel the pressure and practice the leg sequencing, then switched to one rail on the left turned slightly sideways with both hands on rail.  Min to min guard assist for safety. Pt still could not report correct LE sequencing after practicing twice.    Balance  Overall balance assessment Needs assistance  Sitting-balance support Feet supported;No upper extremity supported  Sitting balance-Leahy Scale Good  Standing balance support Bilateral upper extremity supported;No upper extremity supported;Single extremity supported  Standing balance-Leahy Scale Fair  Exercises  Exercises Total Joint  Total Joint Exercises  Short Arc Quad AAROM;Right;10 reps  Hip ABduction/ADduction AROM;Right;10 reps  Straight Leg Raises AAROM;Right;10 reps  PT - End of Session  Equipment Utilized During Treatment Gait belt  Activity Tolerance Patient limited by pain  Patient left with call bell/phone within reach;with family/visitor present;in bed  Nurse Communication Mobility status  CPM Right Knee  CPM Right Knee Off  Additional Comments pt refused   PT - Assessment/Plan  PT Plan Current plan remains appropriate  PT Visit Diagnosis Difficulty in walking, not elsewhere classified (R26.2);Pain  Pain - Right/Left Right  Pain - part of body Knee  PT Frequency (ACUTE ONLY) 7X/week  Follow Up Recommendations Supervision/Assistance - 24 hour;Follow surgeon's recommendation for DC  plan and follow-up therapies  PT equipment None recommended by PT  AM-PAC PT "6 Clicks" Daily Activity Outcome Measure  Difficulty turning over in bed (including adjusting bedclothes, sheets and  blankets)? 1  Difficulty moving from lying on back to sitting on the side of the bed?  1  Difficulty sitting down on and standing up from a chair with arms (e.g., wheelchair, bedside commode, etc,.)? 1  Help needed moving to and from a bed to chair (including a wheelchair)? 3  Help needed walking in hospital room? 3  Help needed climbing 3-5 steps with a railing?  3  6 Click Score 12  Mobility G Code  CL  PT Goal Progression  Progress towards PT goals Progressing toward goals  PT Time Calculation  PT Start Time (ACUTE ONLY) 1549  PT Stop Time (ACUTE ONLY) 1625  PT Time Calculation (min) (ACUTE ONLY) 36 min  PT General Charges  $$ ACUTE PT VISIT 1 Visit  PT Treatments  $Gait Training 8-22 mins  $Therapeutic Exercise 8-22 mins

## 2017-12-05 NOTE — Progress Notes (Signed)
Physical Therapy Treatment Patient Details Name: Kelly Perkins MRN: [id number] DOB: 11-Feb-1951 Today's Date: 12/05/2017    History of Present Illness Patient is a 67 y/o female admitted for R TKA on 12/04/17.  PMH positive for OA, HTN, and L TKA.    PT Comments    Pt is POD #1 and significantly improved.  She walked a great distance down the hallway with her RW, completed her HEP exercises and sat up in the chair.  She is likely to progress well enough to d/c home with family when MD deems appropriate.  PT to see this PM to initiate stair training.   Follow Up Recommendations  Supervision/Assistance - 24 hour;Follow surgeon's recommendation for DC plan and follow-up therapies     Equipment Recommendations  None recommended by PT    Recommendations for Other Services   NA     Precautions / Restrictions Precautions Precautions: Knee Precaution Booklet Issued: Yes (comment) Precaution Comments: knee exercise handout given and reviewed as well as knee precaution Restrictions Other Position/Activity Restrictions: WBAT    Mobility  Bed Mobility Overal bed mobility: Needs Assistance Bed Mobility: Supine to Sit     Supine to sit: Min assist;HOB elevated     General bed mobility comments: Min assist to help progress right leg over EOB.  Cues for technique and sequencing.   Transfers Overall transfer level: Needs assistance Equipment used: Rolling walker (2 wheeled) Transfers: Sit to/from Stand Sit to Stand: Min guard         General transfer comment: Min guard assist for safety.   Ambulation/Gait Ambulation/Gait assistance: Min guard Gait Distance (Feet): 200 Feet Assistive device: Rolling walker (2 wheeled) Gait Pattern/deviations: Step-through pattern;Antalgic     General Gait Details: Pt with moderately antalgic stiff legged gait pattern, cues to stay closer to RW and for heel to toe gait pattern.            Balance Overall balance assessment: Needs  assistance Sitting-balance support: Feet supported;No upper extremity supported Sitting balance-Leahy Scale: Good     Standing balance support: Bilateral upper extremity supported;No upper extremity supported;Single extremity supported Standing balance-Leahy Scale: Fair                              Cognition Arousal/Alertness: Awake/alert Behavior During Therapy: WFL for tasks assessed/performed Overall Cognitive Status: Within Functional Limits for tasks assessed                                        Exercises Total Joint Exercises Ankle Circles/Pumps: AROM;Both;20 reps Quad Sets: AROM;Right;10 reps Towel Squeeze: AROM;Both;10 reps Heel Slides: AAROM;Right;10 reps Goniometric ROM: 10-75        Pertinent Vitals/Pain Pain Assessment: 0-10 Pain Score: 6  Pain Location: R knee Pain Descriptors / Indicators: Grimacing;Guarding Pain Intervention(s): Limited activity within patient's tolerance;Monitored during session;Repositioned;RN gave pain meds during session        PT Goals (current goals can now be found in the care plan section) Acute Rehab PT Goals Patient Stated Goal: to go home Progress towards PT goals: Progressing toward goals    Frequency    7X/week      PT Plan Current plan remains appropriate       AM-PAC PT "6 Clicks" Daily Activity  Outcome Measure  Difficulty turning over in bed (including adjusting bedclothes, sheets and blankets)?:  Unable Difficulty moving from lying on back to sitting on the side of the bed? : Unable Difficulty sitting down on and standing up from a chair with arms (e.g., wheelchair, bedside commode, etc,.)?: Unable Help needed moving to and from a bed to chair (including a wheelchair)?: A Little Help needed walking in hospital room?: A Little Help needed climbing 3-5 steps with a railing? : A Little 6 Click Score: 12    End of Session Equipment Utilized During Treatment: Gait belt Activity  Tolerance: Patient limited by pain Patient left: in chair;with call bell/phone within reach;with family/visitor present Nurse Communication: Mobility status PT Visit Diagnosis: Difficulty in walking, not elsewhere classified (R26.2);Pain Pain - Right/Left: Right Pain - part of body: Knee     Time: 1211-1242 PT Time Calculation (min) (ACUTE ONLY): 31 min  Charges:  $Gait Training: 8-22 mins $Therapeutic Exercise: 8-22 mins                    Angelin Cutrone B. Tymothy Cass, PT, DPT  Acute Rehabilitation [phone] pager #[phone] office   12/05/2017, 5:00 PM

## 2017-12-05 NOTE — Progress Notes (Signed)
Subjective: 1 Day Post-Op Procedure(s) (LRB): RIGHT TOTAL KNEE ARTHROPLASTY (Right) Patient reports pain as mild.  Doing well this am.   Objective: Vital signs in last 24 hours: Temp:  [97.4 F (36.3 C)-98.6 F (37 C)] 97.6 F (36.4 C) (09/24 0445) Pulse Rate:  [74-85] 74 (09/24 0445) Resp:  [14-25] 18 (09/24 0445) BP: (114-170)/(66-80) 141/69 (09/24 0445) SpO2:  [95 %-100 %] 96 % (09/24 0445)  Intake/Output from previous day: 09/23 0701 - 09/24 0700 In: 0762 [P.O.:120; I.V.:1600] Out: 205 [Urine:200; Blood:5] Intake/Output this shift: No intake/output data recorded.  Recent Labs    12/05/17 0624  HGB 9.8*   Recent Labs    12/05/17 0624  WBC 6.8  RBC 3.32*  HCT 30.9*  PLT 195   Recent Labs    12/05/17 0624  NA 140  K 3.5  CL 107  CO2 24  BUN 12  CREATININE 0.79  GLUCOSE 135*  CALCIUM 8.4*   No results for input(s): LABPT, INR in the last 72 hours.  Neurologically intact Neurovascular intact Sensation intact distally Intact pulses distally Dorsiflexion/Plantar flexion intact Incision: dressing C/D/I No cellulitis present Compartment soft  Anticipated LOS equal to or greater than 2 midnights due to - Age 25 and older with one or more of the following:  - Obesity  - Expected need for hospital services (PT, OT, Nursing) required for safe  discharge  - Anticipated need for postoperative skilled nursing care or inpatient rehab  - Active co-morbidities: None OR   - Unanticipated findings during/Post Surgery: Slow post-op progression: GI, pain control, mobility  - Patient is a high risk of re-admission due to: None   Assessment/Plan: 1 Day Post-Op Procedure(s) (LRB): RIGHT TOTAL KNEE ARTHROPLASTY (Right) Advance diet Up with therapy D/C IV fluids Discharge home with home health likely tomorrow WBAT RLE ABLA-mild and stable Will change bandage tomorrow prior to d/c    Kelly Perkins 12/05/2017, 8:14 AM

## 2017-12-06 DIAGNOSIS — Z23 Encounter for immunization: Secondary | ICD-10-CM | POA: Diagnosis not present

## 2017-12-06 NOTE — Progress Notes (Signed)
Physical Therapy Treatment Patient Details Name: Kelly Perkins MRN: [id number] DOB: May 20, 1950 Today's Date: 12/06/2017    History of Present Illness Patient is a 67 y/o female admitted for R TKA on 12/04/17.  PMH positive for OA, HTN, and L TKA.    PT Comments    Pt making steady progress with functional mobility. She participated in stair training again this session with min guard and cueing for sequencing. Pt requesting a second session prior to d/c home today. PT will continue to follow acutely.    Follow Up Recommendations  Home health PT;Supervision/Assistance - 24 hour     Equipment Recommendations  None recommended by PT    Recommendations for Other Services       Precautions / Restrictions Precautions Precautions: Knee Precaution Comments: Reviewed positioning of LE following knee surgery with pt Restrictions Weight Bearing Restrictions: Yes RLE Weight Bearing: Weight bearing as tolerated    Mobility  Bed Mobility Overal bed mobility: Needs Assistance Bed Mobility: Supine to Sit;Sit to Supine     Supine to sit: Min guard Sit to supine: Min guard   General bed mobility comments: increased time and effort, min guard for safety; pt able to use a towel under R LE to assist with bilateral LEs back onto bed  Transfers Overall transfer level: Needs assistance Equipment used: Rolling walker (2 wheeled) Transfers: Sit to/from Stand Sit to Stand: Min guard         General transfer comment: min guard for safety, good technique demonstrated  Ambulation/Gait Ambulation/Gait assistance: Min guard Gait Distance (Feet): 200 Feet(100' x2 with sitting rest break in between) Assistive device: Rolling walker (2 wheeled) Gait Pattern/deviations: Step-through pattern;Antalgic Gait velocity: decreased Gait velocity interpretation: <1.31 ft/sec, indicative of household ambulator General Gait Details: pt continues with moderate antalgic gait, steady with RW, slow and  cautious   Stairs Stairs: Yes Stairs assistance: Min guard Stair Management: One rail Left;Step to pattern;Sideways;Forwards Number of Stairs: 2 General stair comments: used L handrail to simulate home set up, cueing for sequencing   Wheelchair Mobility    Modified Rankin (Stroke Patients Only)       Balance Overall balance assessment: Needs assistance Sitting-balance support: Feet supported;No upper extremity supported Sitting balance-Leahy Scale: Good     Standing balance support: Bilateral upper extremity supported;No upper extremity supported;Single extremity supported Standing balance-Leahy Scale: Poor                              Cognition Arousal/Alertness: Awake/alert Behavior During Therapy: WFL for tasks assessed/performed Overall Cognitive Status: Within Functional Limits for tasks assessed                                        Exercises      General Comments        Pertinent Vitals/Pain Pain Assessment: Faces Faces Pain Scale: Hurts even more Pain Location: R knee Pain Descriptors / Indicators: Grimacing;Guarding Pain Intervention(s): Monitored during session;Repositioned    Home Living                      Prior Function            PT Goals (current goals can now be found in the care plan section) Acute Rehab PT Goals PT Goal Formulation: With patient/family Time For Goal Achievement: 12/11/17 Potential to  Achieve Goals: Good Progress towards PT goals: Progressing toward goals    Frequency    7X/week      PT Plan Current plan remains appropriate    Co-evaluation              AM-PAC PT "6 Clicks" Daily Activity  Outcome Measure  Difficulty turning over in bed (including adjusting bedclothes, sheets and blankets)?: Unable Difficulty moving from lying on back to sitting on the side of the bed? : Unable Difficulty sitting down on and standing up from a chair with arms (e.g., wheelchair,  bedside commode, etc,.)?: Unable Help needed moving to and from a bed to chair (including a wheelchair)?: None Help needed walking in hospital room?: None Help needed climbing 3-5 steps with a railing? : A Little 6 Click Score: 14    End of Session Equipment Utilized During Treatment: Gait belt Activity Tolerance: Patient tolerated treatment well Patient left: in bed;with call bell/phone within reach Nurse Communication: Mobility status PT Visit Diagnosis: Other abnormalities of gait and mobility (R26.89);Pain Pain - Right/Left: Right Pain - part of body: Knee     Time: 0829-0907 PT Time Calculation (min) (ACUTE ONLY): 38 min  Charges:  $Gait Training: 8-22 mins $Therapeutic Activity: 23-37 mins                     Sherie Don, PT, DPT  Acute Rehabilitation Services Pager [phone] Office [place] 12/06/2017, 10:28 AM

## 2017-12-06 NOTE — Plan of Care (Signed)
  Problem: Education: Goal: Knowledge of General Education information will improve Description: Including pain rating scale, medication(s)/side effects and non-pharmacologic comfort measures Outcome: Progressing   Problem: Health Behavior/Discharge Planning: Goal: Ability to manage health-related needs will improve Outcome: Progressing   Problem: Clinical Measurements: Goal: Ability to maintain clinical measurements within normal limits will improve Outcome: Progressing Goal: Will remain free from infection Outcome: Progressing   Problem: Activity: Goal: Risk for activity intolerance will decrease Outcome: Progressing   Problem: Nutrition: Goal: Adequate nutrition will be maintained Outcome: Progressing   Problem: Elimination: Goal: Will not experience complications related to bowel motility Outcome: Progressing Goal: Will not experience complications related to urinary retention Outcome: Progressing   Problem: Pain Managment: Goal: General experience of comfort will improve Outcome: Progressing   Problem: Safety: Goal: Ability to remain free from injury will improve Outcome: Progressing   Problem: Skin Integrity: Goal: Risk for impaired skin integrity will decrease Outcome: Progressing   

## 2017-12-06 NOTE — Consult Note (Signed)
   [facility] CM Inpatient Consult   12/06/2017  Kelly Perkins [birth year] [phone]  Referral received from insurance to assess for care management services.    Met with the patient regarding the benefits of [place] [facility] services. Patient admitted for a Left Total Knee Arthroplasty.  Explained that [place] [facility] is a covered benefit of [employer]. Patient states she has good transportation with her daughter and with [employer]  She denies any issues with managing her medications or any resource needs.  Reviewed information for [facility] Care Management and a brochure with 24 hour nurse advise line was provided with contact information.  Explained that [place] [facility] does not interfere with or replace any services arranged by the inpatient care management staff. Patient states she will have [facility] for [place] services.   Patient politely declined services with [place] [facility] stating she feels she will be able to manage well as she had her "right knee done in the past".    For questions or changes, please contact:  Natividad Brood, RN BSN [facility] Liaison  [phone] business mobile phone Toll free office [phone]

## 2017-12-06 NOTE — Progress Notes (Signed)
Subjective: 2 Days Post-Op Procedure(s) (LRB): RIGHT TOTAL KNEE ARTHROPLASTY (Right) Patient reports pain as moderate.  Mobilizing very well with PT.  Feels pretty good overall  Objective: Vital signs in last 24 hours: Temp:  [97.6 F (36.4 C)-98.3 F (36.8 C)] 97.6 F (36.4 C) (09/25 0417) Pulse Rate:  [71-84] 71 (09/25 0417) Resp:  [16-18] 16 (09/25 0417) BP: (135-147)/(66-76) 135/66 (09/25 0417) SpO2:  [97 %-99 %] 99 % (09/25 0417)  Intake/Output from previous day: 09/24 0701 - 09/25 0700 In: 480 [P.O.:480] Out: -  Intake/Output this shift: No intake/output data recorded.  Recent Labs    12/05/17 0624  HGB 9.8*   Recent Labs    12/05/17 0624  WBC 6.8  RBC 3.32*  HCT 30.9*  PLT 195   Recent Labs    12/05/17 0624  NA 140  K 3.5  CL 107  CO2 24  BUN 12  CREATININE 0.79  GLUCOSE 135*  CALCIUM 8.4*   No results for input(s): LABPT, INR in the last 72 hours.  Neurologically intact Neurovascular intact Sensation intact distally Intact pulses distally Dorsiflexion/Plantar flexion intact Incision: dressing C/D/I No cellulitis present Compartment soft  Anticipated LOS equal to or greater than 2 midnights due to - Age 11 and older with one or more of the following:  - Obesity  - Expected need for hospital services (PT, OT, Nursing) required for safe  discharge  - Anticipated need for postoperative skilled nursing care or inpatient rehab  - Active co-morbidities: None OR   - Unanticipated findings during/Post Surgery: Slow post-op progression: GI, pain control, mobility  - Patient is a high risk of re-admission due to: None   Assessment/Plan: 2 Days Post-Op Procedure(s) (LRB): RIGHT TOTAL KNEE ARTHROPLASTY (Right) Advance diet Up with therapy Discharge home with home health after second PT session today WBAT RLE ABLA-mild and stable Dressing changed by me today    Aundra Dubin 12/06/2017, 7:24 AM

## 2017-12-06 NOTE — Care Management Note (Signed)
Case Management Note  Patient Details  Name: CHARNAE LILL MRN: [id number] Date of Birth: 19-Apr-1950  Subjective/Objective:   67 yr old female s/p right total knee arthroplasty.                 Action/Plan: Case manager spoke with patient and family concerning discharge plan and DME. Patient was preoperatively setup with [facility], no changes. She says that she has RW and 3in1, someone will have to get them from storage shed. Patient will have family support at discharge.   Expected Discharge Date:  12/06/17               Expected Discharge Plan:  [place]  In-House Referral:  NA  Discharge planning Services  CM Consult  Post Acute Care Choice:  Home Health Choice offered to:  Patient  DME Arranged:  N/A(has RW and 3in1) DME Agency:  NA  HH Arranged:  PT [place] Agency:  [facility] (formerly [employer])  Status of Service:  Completed, signed off  If discussed at [employer], dates discussed:    Additional Comments:  Ninfa Meeker, RN 12/06/2017, 10:13 AM

## 2017-12-06 NOTE — Progress Notes (Signed)
CSW received SNF consult.  Per RNCM patient was preoperatively setup with [facility], no changes. Pt has family support at home. No social work needs identified at this time.   CSW signing off.   [place], [place]

## 2017-12-06 NOTE — Discharge Summary (Signed)
Patient ID: Kelly Perkins MRN: [id number] DOB/AGE: May 22, 1950 67 y.o.  Admit date: 12/04/2017 Discharge date: 12/06/2017  Admission Diagnoses:  Principal Problem:   Osteoarthritis of right knee Active Problems:   Total knee replacement status   Discharge Diagnoses:  Same  Past Medical History:  Diagnosis Date  . GERD (gastroesophageal reflux disease)   . History of kidney stones   . Hyperlipidemia 09/10/2010  . Hypertension    no meds  . Hypothyroidism   . Normal cardiac stress test 6-12   had CP-DOE  . Osteoarthritis   . Wears dentures    top  . Wears glasses     Surgeries: Procedure(s): RIGHT TOTAL KNEE ARTHROPLASTY on 12/04/2017   Consultants:   Discharged Condition: Improved  Hospital Course: Kelly Perkins is an 67 y.o. female who was admitted 12/04/2017 for operative treatment ofOsteoarthritis of right knee. Patient has severe unremitting pain that affects sleep, daily activities, and work/hobbies. After pre-op clearance the patient was taken to the operating room on 12/04/2017 and underwent  Procedure(s): RIGHT TOTAL KNEE ARTHROPLASTY.    Patient was given perioperative antibiotics:  Anti-infectives (From admission, onward)   Start     Dose/Rate Route Frequency Ordered Stop   12/04/17 1600  ceFAZolin (ANCEF) IVPB 2g/100 mL premix     2 g 200 mL/hr over 30 Minutes Intravenous Every 6 hours 12/04/17 1313 12/05/17 0430   12/04/17 1315  sulfamethoxazole-trimethoprim (BACTRIM DS,SEPTRA DS) 800-160 MG per tablet 1 tablet     1 tablet Oral Every 12 hours 12/04/17 1313     12/04/17 1021  vancomycin (VANCOCIN) powder  Status:  Discontinued       As needed 12/04/17 1021 12/04/17 1203   12/04/17 0900  ceFAZolin (ANCEF) IVPB 2g/100 mL premix  Status:  Discontinued     2 g 200 mL/hr over 30 Minutes Intravenous To ShortStay Surgical 12/01/17 1343 12/01/17 1408   12/04/17 0900  ceFAZolin (ANCEF) IVPB 2g/100 mL premix     2 g 200 mL/hr over 30 Minutes Intravenous To ShortStay  Surgical 12/01/17 1408 12/04/17 0941   12/04/17 0000  sulfamethoxazole-trimethoprim (BACTRIM DS,SEPTRA DS) 800-160 MG tablet     1 tablet Oral 2 times daily 12/04/17 1127         Patient was given sequential compression devices, early ambulation, and chemoprophylaxis to prevent DVT.  Patient benefited maximally from hospital stay and there were no complications.    Recent vital signs:  Patient Vitals for the past 24 hrs:  BP Temp Temp src Pulse Resp SpO2  12/06/17 0417 135/66 97.6 F (36.4 C) Oral 71 16 99 %  12/05/17 1937 (!) 142/76 97.7 F (36.5 C) Oral 84 18 98 %  12/05/17 1423 (!) 147/66 98.3 F (36.8 C) Oral 83 18 98 %  12/05/17 0832 138/69 98.1 F (36.7 C) Oral 76 16 97 %     Recent laboratory studies:  Recent Labs    12/05/17 0624  WBC 6.8  HGB 9.8*  HCT 30.9*  PLT 195  NA 140  K 3.5  CL 107  CO2 24  BUN 12  CREATININE 0.79  GLUCOSE 135*  CALCIUM 8.4*     Discharge Medications:   Allergies as of 12/06/2017   No Known Allergies     Medication List    STOP taking these medications   cyclobenzaprine 10 MG tablet Commonly known as:  FLEXERIL   diclofenac sodium 1 % Gel Commonly known as:  VOLTAREN   meloxicam 7.5 MG tablet Commonly  known as:  MOBIC   oxyCODONE-acetaminophen 5-325 MG tablet Commonly known as:  PERCOCET/ROXICET   traMADol 50 MG tablet Commonly known as:  ULTRAM     TAKE these medications   aspirin EC 81 MG tablet Take 1 tablet (81 mg total) by mouth 2 (two) times daily.   levothyroxine 150 MCG tablet Commonly known as:  SYNTHROID, LEVOTHROID Take 1 tablet (150 mcg total) by mouth daily before breakfast.   lisinopril-hydrochlorothiazide 10-12.5 MG tablet Commonly known as:  PRINZIDE,ZESTORETIC Take 1 tablet by mouth daily.   methocarbamol 750 MG tablet Commonly known as:  ROBAXIN TAKE 1 TABLET TWICE A DAY AS NEEDED FOR MUSCLE SPASMS What changed:  Another medication with the same name was added. Make sure you understand  how and when to take each.   methocarbamol 750 MG tablet Commonly known as:  ROBAXIN Take 1 tablet (750 mg total) by mouth 2 (two) times daily as needed for muscle spasms. What changed:  You were already taking a medication with the same name, and this prescription was added. Make sure you understand how and when to take each.   ondansetron 4 MG disintegrating tablet Commonly known as:  ZOFRAN-ODT Take 1 tablet (4 mg total) by mouth every 8 (eight) hours as needed for nausea.   ondansetron 4 MG tablet Commonly known as:  ZOFRAN Take 1 tablet (4 mg total) by mouth daily as needed for nausea or vomiting. What changed:  Another medication with the same name was added. Make sure you understand how and when to take each.   ondansetron 4 MG tablet Commonly known as:  ZOFRAN Take 1-2 tablets (4-8 mg total) by mouth every 8 (eight) hours as needed for nausea or vomiting. What changed:  You were already taking a medication with the same name, and this prescription was added. Make sure you understand how and when to take each.   oxyCODONE 5 MG immediate release tablet Commonly known as:  Oxy IR/ROXICODONE Take 1-3 tablets (5-15 mg total) by mouth every 4 (four) hours as needed.   oxyCODONE 10 mg 12 hr tablet Commonly known as:  OXYCONTIN Take 1 tablet (10 mg total) by mouth every 12 (twelve) hours for 3 days.   pantoprazole 40 MG tablet Commonly known as:  PROTONIX Take 1 tablet (40 mg total) by mouth daily.   phenazopyridine 200 MG tablet Commonly known as:  PYRIDIUM Take 1 tablet (200 mg total) by mouth 3 (three) times daily as needed (for pain with urination).   promethazine 25 MG tablet Commonly known as:  PHENERGAN Take 1 tablet (25 mg total) by mouth every 6 (six) hours as needed for nausea.   senna-docusate 8.6-50 MG tablet Commonly known as:  Senokot-S Take 1 tablet by mouth at bedtime as needed.   simvastatin 20 MG tablet Commonly known as:  ZOCOR Take 1 tablet (20 mg  total) by mouth at bedtime.   sulfamethoxazole-trimethoprim 800-160 MG tablet Commonly known as:  BACTRIM DS,SEPTRA DS Take 1 tablet by mouth 2 (two) times daily.   tamsulosin 0.4 MG Caps capsule Commonly known as:  FLOMAX Take 1 capsule (0.4 mg total) by mouth daily after supper.   zolpidem 10 MG tablet Commonly known as:  AMBIEN TAKE 1 TABLET BY MOUTH EVERYDAY AT BEDTIME What changed:  See the new instructions.            Durable Medical Equipment  (From admission, onward)         Start  Ordered   12/04/17 1314  DME Walker rolling  Once    Question:  Patient needs a walker to treat with the following condition  Answer:  Total knee replacement status   12/04/17 1313   12/04/17 1314  DME 3 n 1  Once     12/04/17 1313   12/04/17 1314  DME Bedside commode  Once    Question:  Patient needs a bedside commode to treat with the following condition  Answer:  Total knee replacement status   12/04/17 1313          Diagnostic Studies: Dg Knee Right Port  Result Date: 12/04/2017 CLINICAL DATA:  Total knee replacement status /right EXAM: PORTABLE RIGHT KNEE - 1-2 VIEW COMPARISON:  12/10/2012 FINDINGS: Patient has undergone RIGHT total knee arthroplasty. The components appear well-seated. Expected postoperative gas is present. No interval fracture. IMPRESSION: Status post RIGHT total knee arthroplasty.  No adverse features. Electronically Signed   By: Nolon Nations M.D.   On: 12/04/2017 13:03   Dg C-arm 1-60 Min-no Report  Result Date: 11/17/2017 Fluoroscopy was utilized by the requesting physician.  No radiographic interpretation.   Ct Renal Stone Study  Result Date: 11/17/2017 CLINICAL DATA:  Right-sided abdominal pain. Right upper quadrant pain that is tender to touch since 5 p.m. this evening. One episode of vomiting. EXAM: CT ABDOMEN AND PELVIS WITHOUT CONTRAST TECHNIQUE: Multidetector CT imaging of the abdomen and pelvis was performed following the standard protocol  without IV contrast. COMPARISON:  02/25/2008 FINDINGS: LOWER CHEST: Borderline cardiomegaly without pericardial effusion. Dependent atelectasis at the right lung base. No effusion or pneumothorax. HEPATOBILIARY: Mild steatosis of the liver. Unremarkable gallbladder. PANCREAS: Normal without inflammation or mass. No ductal dilatation. SPLEEN: Normal ADRENALS/URINARY TRACT: Orthotopic kidneys with left-sided renal calculi measuring up to 6 mm in the upper pole and 4 mm in the interpolar aspect. No hydroureteronephrosis on the left. Mild right-sided hydroureteronephrosis secondary to a 7 x 6 x 5 mm calculus at the pelvic brim. STOMACH/BOWEL: The stomach, small and large bowel are normal in course and caliber without inflammatory changes. Normal appendix. VASCULAR/LYMPHATIC: Aortoiliac vessels are normal in course and caliber. No lymphadenopathy by CT size criteria. REPRODUCTIVE: Hysterectomy.  No adnexal mass. OTHER: No intraperitoneal free fluid or free air. MUSCULOSKELETAL: Degenerative disc disease L4-5 with vacuum disc phenomenon and mild degenerative grade 1 anterolisthesis of L4 and L5. No acute nor suspicious osseous abnormalities. IMPRESSION: 1. 7 x 6 x 5 mm calculus within the right distal ureter at the pelvic brim causing right-sided mild hydroureteronephrosis. 2. Left-sided nephrolithiasis. 3. Mild steatosis of the liver. 4. Degenerative disc disease L4-5 with grade 1 anterolisthesis of L4 on L5. Electronically Signed   By: Ashley Royalty M.D.   On: 11/17/2017 00:37   Xr Knee 3 View Left  Result Date: 11/09/2017 Stable left total knee replacement without evidence of complication.  Xr Knee 3 View Right  Result Date: 11/09/2017 Advanced tricompartmental degenerative joint disease   Disposition: Discharge disposition: 01-Home or Self Care         Follow-up Information    Leandrew Koyanagi, MD In 2 weeks.   Specialty:  Orthopedic Surgery Why:  For suture removal, For wound re-check Contact  information: [place] [phone]        [facility] Follow up.   Specialty:  [place] Why:  A representative from [facility] will contact you to arrange start date and time for your therapy.  Contact information: [address] [place] [address] [phone]            Signed: Aundra Dubin 12/06/2017, 7:25 AM

## 2017-12-06 NOTE — Progress Notes (Signed)
Physical Therapy Treatment Patient Details Name: Kelly Perkins MRN: [id number] DOB: Aug 09, 1950 Today's Date: 12/06/2017    History of Present Illness Patient is a 67 y/o female admitted for R TKA on 12/04/17.  PMH positive for OA, HTN, and L TKA.    PT Comments    Pt making steady progress with functional mobility. Plan is for her to d/c home today. Pt is ready to d/c home from PT perspective at this time.   Pt would continue to benefit from skilled physical therapy services at this time while admitted and after d/c to address the below listed limitations in order to improve overall safety and independence with functional mobility.    Follow Up Recommendations  Home health PT;Supervision/Assistance - 24 hour     Equipment Recommendations  None recommended by PT    Recommendations for Other Services       Precautions / Restrictions Precautions Precautions: Knee Precaution Comments: Reviewed positioning of LE following knee surgery with pt Restrictions Weight Bearing Restrictions: Yes RLE Weight Bearing: Weight bearing as tolerated    Mobility  Bed Mobility Overal bed mobility: Needs Assistance Bed Mobility: Supine to Sit     Supine to sit: Min guard Sit to supine: Min guard   General bed mobility comments: increased time and effort, min guard for safety; pt able to use a towel under R LE to assist with bilateral LEs off of bed  Transfers Overall transfer level: Needs assistance Equipment used: Rolling walker (2 wheeled) Transfers: Sit to/from Stand Sit to Stand: Min guard         General transfer comment: min guard for safety, good technique demonstrated  Ambulation/Gait Ambulation/Gait assistance: Min guard Gait Distance (Feet): 250 Feet Assistive device: Rolling walker (2 wheeled) Gait Pattern/deviations: Step-through pattern;Decreased stride length Gait velocity: decreased Gait velocity interpretation: <1.31 ft/sec, indicative of household  ambulator General Gait Details: pt continues with moderate antalgic gait, steady with RW, slow and cautious   Stairs Stairs: Yes Stairs assistance: Min guard Stair Management: One rail Left;Step to pattern;Sideways;Forwards Number of Stairs: 2 General stair comments: used L handrail to simulate home set up, cueing for sequencing   Wheelchair Mobility    Modified Rankin (Stroke Patients Only)       Balance Overall balance assessment: Needs assistance Sitting-balance support: Feet supported;No upper extremity supported Sitting balance-Leahy Scale: Good     Standing balance support: Bilateral upper extremity supported;No upper extremity supported;Single extremity supported Standing balance-Leahy Scale: Poor                              Cognition Arousal/Alertness: Awake/alert Behavior During Therapy: WFL for tasks assessed/performed Overall Cognitive Status: Within Functional Limits for tasks assessed                                        Exercises Total Joint Exercises Ankle Circles/Pumps: AROM;Both;20 reps Quad Sets: AROM;Right;10 reps Gluteal Sets: AROM;Strengthening;Both;10 reps;Supine Heel Slides: AAROM;Right;10 reps Hip ABduction/ADduction: AROM;Right;10 reps Straight Leg Raises: AAROM;Right;10 reps Goniometric ROM: Flexion = 75 degrees; Extension = lacking 10 degrees to neutral    General Comments        Pertinent Vitals/Pain Pain Assessment: Faces Faces Pain Scale: Hurts even more Pain Location: R knee Pain Descriptors / Indicators: Grimacing;Guarding Pain Intervention(s): Monitored during session;Repositioned    Home Living  Prior Function            PT Goals (current goals can now be found in the care plan section) Acute Rehab PT Goals PT Goal Formulation: With patient/family Time For Goal Achievement: 12/11/17 Potential to Achieve Goals: Good Progress towards PT goals: Progressing  toward goals    Frequency    7X/week      PT Plan Current plan remains appropriate    Co-evaluation              AM-PAC PT "6 Clicks" Daily Activity  Outcome Measure  Difficulty turning over in bed (including adjusting bedclothes, sheets and blankets)?: Unable Difficulty moving from lying on back to sitting on the side of the bed? : Unable Difficulty sitting down on and standing up from a chair with arms (e.g., wheelchair, bedside commode, etc,.)?: Unable Help needed moving to and from a bed to chair (including a wheelchair)?: None Help needed walking in hospital room?: None Help needed climbing 3-5 steps with a railing? : A Little 6 Click Score: 14    End of Session Equipment Utilized During Treatment: Gait belt Activity Tolerance: Patient tolerated treatment well Patient left: in chair;with call bell/phone within reach Nurse Communication: Mobility status PT Visit Diagnosis: Other abnormalities of gait and mobility (R26.89);Pain Pain - Right/Left: Right Pain - part of body: Knee     Time: 8138-8719 PT Time Calculation (min) (ACUTE ONLY): 33 min  Charges:  $Gait Training: 8-22 mins $Therapeutic Exercise: 8-22 mins                    Sherie Don, PT, DPT  Acute Rehabilitation Services Pager [phone] Office [place] 12/06/2017, 1:27 PM

## 2017-12-07 ENCOUNTER — Telehealth: Payer: Self-pay

## 2017-12-07 ENCOUNTER — Telehealth (INDEPENDENT_AMBULATORY_CARE_PROVIDER_SITE_OTHER): Payer: Self-pay | Admitting: Orthopaedic Surgery

## 2017-12-07 DIAGNOSIS — K219 Gastro-esophageal reflux disease without esophagitis: Secondary | ICD-10-CM | POA: Diagnosis not present

## 2017-12-07 DIAGNOSIS — D649 Anemia, unspecified: Secondary | ICD-10-CM | POA: Diagnosis not present

## 2017-12-07 DIAGNOSIS — I10 Essential (primary) hypertension: Secondary | ICD-10-CM | POA: Diagnosis not present

## 2017-12-07 DIAGNOSIS — F5104 Psychophysiologic insomnia: Secondary | ICD-10-CM | POA: Diagnosis not present

## 2017-12-07 DIAGNOSIS — E039 Hypothyroidism, unspecified: Secondary | ICD-10-CM | POA: Diagnosis not present

## 2017-12-07 DIAGNOSIS — Z6841 Body Mass Index (BMI) 40.0 and over, adult: Secondary | ICD-10-CM | POA: Diagnosis not present

## 2017-12-07 DIAGNOSIS — E785 Hyperlipidemia, unspecified: Secondary | ICD-10-CM | POA: Diagnosis not present

## 2017-12-07 DIAGNOSIS — Z471 Aftercare following joint replacement surgery: Secondary | ICD-10-CM | POA: Diagnosis not present

## 2017-12-07 NOTE — Telephone Encounter (Signed)
See message below. Ok on orders.

## 2017-12-07 NOTE — Telephone Encounter (Signed)
[facility] is requesting verbal orders to see patient   2 x this week 3 x next week  CB # [phone] (can leave message on secure vm)

## 2017-12-07 NOTE — Telephone Encounter (Signed)
yes

## 2017-12-07 NOTE — Telephone Encounter (Signed)
No TCM per Dr. Larose Kells.

## 2017-12-07 NOTE — Telephone Encounter (Signed)
Called to advise on message. Ok on orders per Dr Erlinda Hong.

## 2017-12-11 DIAGNOSIS — Z6841 Body Mass Index (BMI) 40.0 and over, adult: Secondary | ICD-10-CM | POA: Diagnosis not present

## 2017-12-11 DIAGNOSIS — I10 Essential (primary) hypertension: Secondary | ICD-10-CM | POA: Diagnosis not present

## 2017-12-11 DIAGNOSIS — E785 Hyperlipidemia, unspecified: Secondary | ICD-10-CM | POA: Diagnosis not present

## 2017-12-11 DIAGNOSIS — D649 Anemia, unspecified: Secondary | ICD-10-CM | POA: Diagnosis not present

## 2017-12-11 DIAGNOSIS — E039 Hypothyroidism, unspecified: Secondary | ICD-10-CM | POA: Diagnosis not present

## 2017-12-11 DIAGNOSIS — F5104 Psychophysiologic insomnia: Secondary | ICD-10-CM | POA: Diagnosis not present

## 2017-12-11 DIAGNOSIS — K219 Gastro-esophageal reflux disease without esophagitis: Secondary | ICD-10-CM | POA: Diagnosis not present

## 2017-12-11 DIAGNOSIS — Z471 Aftercare following joint replacement surgery: Secondary | ICD-10-CM | POA: Diagnosis not present

## 2017-12-13 DIAGNOSIS — E785 Hyperlipidemia, unspecified: Secondary | ICD-10-CM | POA: Diagnosis not present

## 2017-12-13 DIAGNOSIS — Z6841 Body Mass Index (BMI) 40.0 and over, adult: Secondary | ICD-10-CM | POA: Diagnosis not present

## 2017-12-13 DIAGNOSIS — E039 Hypothyroidism, unspecified: Secondary | ICD-10-CM | POA: Diagnosis not present

## 2017-12-13 DIAGNOSIS — D649 Anemia, unspecified: Secondary | ICD-10-CM | POA: Diagnosis not present

## 2017-12-13 DIAGNOSIS — Z471 Aftercare following joint replacement surgery: Secondary | ICD-10-CM | POA: Diagnosis not present

## 2017-12-13 DIAGNOSIS — K219 Gastro-esophageal reflux disease without esophagitis: Secondary | ICD-10-CM | POA: Diagnosis not present

## 2017-12-13 DIAGNOSIS — I10 Essential (primary) hypertension: Secondary | ICD-10-CM | POA: Diagnosis not present

## 2017-12-13 DIAGNOSIS — F5104 Psychophysiologic insomnia: Secondary | ICD-10-CM | POA: Diagnosis not present

## 2017-12-14 DIAGNOSIS — D649 Anemia, unspecified: Secondary | ICD-10-CM | POA: Diagnosis not present

## 2017-12-14 DIAGNOSIS — I10 Essential (primary) hypertension: Secondary | ICD-10-CM | POA: Diagnosis not present

## 2017-12-14 DIAGNOSIS — Z6841 Body Mass Index (BMI) 40.0 and over, adult: Secondary | ICD-10-CM | POA: Diagnosis not present

## 2017-12-14 DIAGNOSIS — K219 Gastro-esophageal reflux disease without esophagitis: Secondary | ICD-10-CM | POA: Diagnosis not present

## 2017-12-14 DIAGNOSIS — Z471 Aftercare following joint replacement surgery: Secondary | ICD-10-CM | POA: Diagnosis not present

## 2017-12-14 DIAGNOSIS — E785 Hyperlipidemia, unspecified: Secondary | ICD-10-CM | POA: Diagnosis not present

## 2017-12-14 DIAGNOSIS — E039 Hypothyroidism, unspecified: Secondary | ICD-10-CM | POA: Diagnosis not present

## 2017-12-14 DIAGNOSIS — F5104 Psychophysiologic insomnia: Secondary | ICD-10-CM | POA: Diagnosis not present

## 2017-12-15 ENCOUNTER — Encounter (INDEPENDENT_AMBULATORY_CARE_PROVIDER_SITE_OTHER): Payer: Self-pay | Admitting: Orthopaedic Surgery

## 2017-12-15 ENCOUNTER — Ambulatory Visit (INDEPENDENT_AMBULATORY_CARE_PROVIDER_SITE_OTHER): Payer: Medicare HMO | Admitting: Physician Assistant

## 2017-12-15 DIAGNOSIS — Z6841 Body Mass Index (BMI) 40.0 and over, adult: Secondary | ICD-10-CM | POA: Insufficient documentation

## 2017-12-15 DIAGNOSIS — Z96651 Presence of right artificial knee joint: Secondary | ICD-10-CM

## 2017-12-15 MED ORDER — OXYCODONE-ACETAMINOPHEN 5-325 MG PO TABS: 1.0000 | ORAL_TABLET | Freq: Four times a day (QID) | ORAL | 0 refills | Status: DC | PRN

## 2017-12-15 NOTE — Progress Notes (Signed)
Post-Op Visit Note   Patient: Kelly Perkins           Date of Birth: 1950/06/12           MRN: [id number] Visit Date: 12/15/2017 PCP: Colon Branch, MD   Assessment & Plan:  Chief Complaint:  Chief Complaint  Patient presents with  . Right Knee - Pain, Routine Post Op   Visit Diagnoses:  1. S/P TKR (total knee replacement), right   2. Morbid obesity ([place])   3. Body mass index 40.0-44.9, adult [facility])     Plan: Patient is a pleasant 67 year old female who presents our clinic today 11 days status post right total knee replacement, date of surgery 12/04/2017.  She has been doing very well since surgery.  She has been getting home health PT and making great progress.  She has been taking moderate amount of narcotic pain medication since surgery.  No fevers or chills.  Examination of the right knee reveals well-healing surgical incision without evidence of infection.  Moderate swelling.  Calf is soft and nontender.  At this point, we will transition the patient to outpatient physical therapy.  A prescription was written today.  I have refilled her oxycodone.  She will follow-up with [place] in 4 weeks time for evaluation and x-ray to include AP and lateral views of the right knee.  Call with concerns or questions in the meantime. The patient meets the AMA guidelines for Morbid (severe) obesity with a BMI > 40.0 and I have recommended weight loss.   Follow-Up Instructions: Return in about 4 weeks (around 01/12/2018).   Orders:  No orders of the defined types were placed in this encounter.  Meds ordered this encounter  Medications  . oxyCODONE-acetaminophen (PERCOCET) 5-325 MG tablet    Sig: Take 1-2 tablets by mouth every 6 (six) hours as needed for severe pain.    Dispense:  40 tablet    Refill:  0    Imaging: No new imaging  PMFS History: Patient Active Problem List   Diagnosis Date Noted  . Body mass index 40.0-44.9, adult ([place]) 12/15/2017  . Osteoarthritis of right knee 12/04/2017    . Morbid obesity ([place]) 07/03/2017  . S/P TKR (total knee replacement), right 11/17/2016  . PCP NOTES >>>>>>>>>>> 06/22/2015  . Annual physical exam 06/09/2014  . GERD (gastroesophageal reflux disease) 06/09/2014  . Hyperlipidemia 09/10/2010  . Anemia 08/11/2010  . HTN (hypertension) 02/27/2008  . ECHOCARDIOGRAM, ABNORMAL 02/27/2008  . Hypothyroidism 04/13/2007  . INSOMNIA, CHRONIC 04/13/2007  . Osteoarthritis 04/13/2007   Past Medical History:  Diagnosis Date  . GERD (gastroesophageal reflux disease)   . History of kidney stones   . Hyperlipidemia 09/10/2010  . Hypertension    no meds  . Hypothyroidism   . Normal cardiac stress test 6-12   had CP-DOE  . Osteoarthritis   . Wears dentures    top  . Wears glasses     Family History  Problem Relation Age of Onset  . Hypertension Mother   . Coronary artery disease Mother        onset?  Marland Kitchen Hypertension Sister   . Colon polyps Other        niece  . Diabetes Neg Hx   . Colon cancer Neg Hx   . Breast cancer Neg Hx   . Stroke Neg Hx   . Rectal cancer Neg Hx   . Stomach cancer Neg Hx     Past Surgical History:  Procedure Laterality  Date  . ABDOMINAL HYSTERECTOMY  2000   B oophoreectomy, d/t DUB  . COLONOSCOPY  2004, 2011  . CYSTOSCOPY/URETEROSCOPY/HOLMIUM LASER/STENT PLACEMENT Right 11/17/2017   Procedure: CYSTOSCOPY/RIGHT URETEROSCOPY/RIGHT HOLMIUM LASER/STENT PLACEMENT;  Surgeon: Ceasar Mons, MD;  Location: [facility];  Service: Urology;  Laterality: Right;  . JOINT REPLACEMENT    . LITHOTRIPSY  2004   urolthiasis   . MASS EXCISION  09/12/2011   Procedure: EXCISION MASS;  Surgeon: Cristine Polio, MD;  Location: [place];  Service: Plastics;  Laterality: Left;  left ring finger  . POLYPECTOMY     TA 2004  . THYROID LOBECTOMY Left 1990s  . TOTAL KNEE ARTHROPLASTY Left 11/17/2016   Procedure: LEFT TOTAL KNEE ARTHROPLASTY;  Surgeon: Leandrew Koyanagi, MD;  Location: [place];  Service: Orthopedics;   Laterality: Left;  . TOTAL KNEE ARTHROPLASTY Right 12/04/2017  . TOTAL KNEE ARTHROPLASTY Right 12/04/2017   Procedure: RIGHT TOTAL KNEE ARTHROPLASTY;  Surgeon: Leandrew Koyanagi, MD;  Location: [place];  Service: Orthopedics;  Laterality: Right;   Social History   Occupational History  . 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 Smoking status: Never Smoker  . Smokeless tobacco: Never Used  Substance and Sexual Activity  . Alcohol use: Yes    Alcohol/week: 0.0 standard drinks    Comment: 12/04/2017 "glass of wine maybe once/month"  . Drug use: Never  . Sexual activity: Not Currently

## 2017-12-19 DIAGNOSIS — M25661 Stiffness of right knee, not elsewhere classified: Secondary | ICD-10-CM | POA: Diagnosis not present

## 2017-12-19 DIAGNOSIS — M25561 Pain in right knee: Secondary | ICD-10-CM | POA: Diagnosis not present

## 2017-12-19 DIAGNOSIS — M25461 Effusion, right knee: Secondary | ICD-10-CM | POA: Diagnosis not present

## 2017-12-19 DIAGNOSIS — R262 Difficulty in walking, not elsewhere classified: Secondary | ICD-10-CM | POA: Diagnosis not present

## 2017-12-20 DIAGNOSIS — M25561 Pain in right knee: Secondary | ICD-10-CM | POA: Diagnosis not present

## 2017-12-20 DIAGNOSIS — M25661 Stiffness of right knee, not elsewhere classified: Secondary | ICD-10-CM | POA: Diagnosis not present

## 2017-12-20 DIAGNOSIS — R262 Difficulty in walking, not elsewhere classified: Secondary | ICD-10-CM | POA: Diagnosis not present

## 2017-12-20 DIAGNOSIS — M25461 Effusion, right knee: Secondary | ICD-10-CM | POA: Diagnosis not present

## 2017-12-22 DIAGNOSIS — M25661 Stiffness of right knee, not elsewhere classified: Secondary | ICD-10-CM | POA: Diagnosis not present

## 2017-12-22 DIAGNOSIS — M25461 Effusion, right knee: Secondary | ICD-10-CM | POA: Diagnosis not present

## 2017-12-22 DIAGNOSIS — M25561 Pain in right knee: Secondary | ICD-10-CM | POA: Diagnosis not present

## 2017-12-22 DIAGNOSIS — R262 Difficulty in walking, not elsewhere classified: Secondary | ICD-10-CM | POA: Diagnosis not present

## 2017-12-25 DIAGNOSIS — M25661 Stiffness of right knee, not elsewhere classified: Secondary | ICD-10-CM | POA: Diagnosis not present

## 2017-12-25 DIAGNOSIS — M25461 Effusion, right knee: Secondary | ICD-10-CM | POA: Diagnosis not present

## 2017-12-25 DIAGNOSIS — M25561 Pain in right knee: Secondary | ICD-10-CM | POA: Diagnosis not present

## 2017-12-25 DIAGNOSIS — R262 Difficulty in walking, not elsewhere classified: Secondary | ICD-10-CM | POA: Diagnosis not present

## 2017-12-27 DIAGNOSIS — M25561 Pain in right knee: Secondary | ICD-10-CM | POA: Diagnosis not present

## 2017-12-27 DIAGNOSIS — M25461 Effusion, right knee: Secondary | ICD-10-CM | POA: Diagnosis not present

## 2017-12-27 DIAGNOSIS — M25661 Stiffness of right knee, not elsewhere classified: Secondary | ICD-10-CM | POA: Diagnosis not present

## 2017-12-27 DIAGNOSIS — R262 Difficulty in walking, not elsewhere classified: Secondary | ICD-10-CM | POA: Diagnosis not present

## 2017-12-28 DIAGNOSIS — M25461 Effusion, right knee: Secondary | ICD-10-CM | POA: Diagnosis not present

## 2017-12-28 DIAGNOSIS — M25561 Pain in right knee: Secondary | ICD-10-CM | POA: Diagnosis not present

## 2017-12-28 DIAGNOSIS — M25661 Stiffness of right knee, not elsewhere classified: Secondary | ICD-10-CM | POA: Diagnosis not present

## 2017-12-28 DIAGNOSIS — R262 Difficulty in walking, not elsewhere classified: Secondary | ICD-10-CM | POA: Diagnosis not present

## 2017-12-29 ENCOUNTER — Ambulatory Visit: Payer: Medicare HMO | Admitting: *Deleted

## 2017-12-29 ENCOUNTER — Ambulatory Visit: Payer: Medicare HMO | Admitting: Internal Medicine

## 2018-01-01 DIAGNOSIS — R262 Difficulty in walking, not elsewhere classified: Secondary | ICD-10-CM | POA: Diagnosis not present

## 2018-01-01 DIAGNOSIS — M25461 Effusion, right knee: Secondary | ICD-10-CM | POA: Diagnosis not present

## 2018-01-01 DIAGNOSIS — M25561 Pain in right knee: Secondary | ICD-10-CM | POA: Diagnosis not present

## 2018-01-01 DIAGNOSIS — M25661 Stiffness of right knee, not elsewhere classified: Secondary | ICD-10-CM | POA: Diagnosis not present

## 2018-01-03 ENCOUNTER — Other Ambulatory Visit (INDEPENDENT_AMBULATORY_CARE_PROVIDER_SITE_OTHER): Payer: Self-pay | Admitting: Orthopaedic Surgery

## 2018-01-04 ENCOUNTER — Other Ambulatory Visit (INDEPENDENT_AMBULATORY_CARE_PROVIDER_SITE_OTHER): Payer: Self-pay | Admitting: Orthopaedic Surgery

## 2018-01-05 DIAGNOSIS — M25561 Pain in right knee: Secondary | ICD-10-CM | POA: Diagnosis not present

## 2018-01-05 DIAGNOSIS — M25661 Stiffness of right knee, not elsewhere classified: Secondary | ICD-10-CM | POA: Diagnosis not present

## 2018-01-05 DIAGNOSIS — R262 Difficulty in walking, not elsewhere classified: Secondary | ICD-10-CM | POA: Diagnosis not present

## 2018-01-05 DIAGNOSIS — M25461 Effusion, right knee: Secondary | ICD-10-CM | POA: Diagnosis not present

## 2018-01-08 DIAGNOSIS — M25661 Stiffness of right knee, not elsewhere classified: Secondary | ICD-10-CM | POA: Diagnosis not present

## 2018-01-08 DIAGNOSIS — M25561 Pain in right knee: Secondary | ICD-10-CM | POA: Diagnosis not present

## 2018-01-08 DIAGNOSIS — M25461 Effusion, right knee: Secondary | ICD-10-CM | POA: Diagnosis not present

## 2018-01-08 DIAGNOSIS — R262 Difficulty in walking, not elsewhere classified: Secondary | ICD-10-CM | POA: Diagnosis not present

## 2018-01-12 ENCOUNTER — Encounter (INDEPENDENT_AMBULATORY_CARE_PROVIDER_SITE_OTHER): Payer: Self-pay | Admitting: Physician Assistant

## 2018-01-12 ENCOUNTER — Ambulatory Visit (INDEPENDENT_AMBULATORY_CARE_PROVIDER_SITE_OTHER): Payer: Medicare HMO | Admitting: Physician Assistant

## 2018-01-12 ENCOUNTER — Ambulatory Visit (INDEPENDENT_AMBULATORY_CARE_PROVIDER_SITE_OTHER): Payer: Medicare HMO

## 2018-01-12 DIAGNOSIS — M25661 Stiffness of right knee, not elsewhere classified: Secondary | ICD-10-CM | POA: Diagnosis not present

## 2018-01-12 DIAGNOSIS — Z96651 Presence of right artificial knee joint: Secondary | ICD-10-CM

## 2018-01-12 DIAGNOSIS — M25561 Pain in right knee: Secondary | ICD-10-CM | POA: Diagnosis not present

## 2018-01-12 DIAGNOSIS — M25461 Effusion, right knee: Secondary | ICD-10-CM | POA: Diagnosis not present

## 2018-01-12 DIAGNOSIS — Z6841 Body Mass Index (BMI) 40.0 and over, adult: Secondary | ICD-10-CM

## 2018-01-12 DIAGNOSIS — R262 Difficulty in walking, not elsewhere classified: Secondary | ICD-10-CM | POA: Diagnosis not present

## 2018-01-12 MED ORDER — TRAMADOL HCL 50 MG PO TABS: ORAL_TABLET | ORAL | 0 refills | Status: DC

## 2018-01-12 NOTE — Progress Notes (Signed)
Post-Op Visit Note   Patient: Kelly Perkins           Date of Birth: 14-Apr-1950           MRN: [id number] Visit Date: 01/12/2018 PCP: Colon Branch, MD   Assessment & Plan:  Chief Complaint:  Chief Complaint  Patient presents with  . Right Knee - Pain, Follow-up   Visit Diagnoses:  1. S/P TKR (total knee replacement), right   2. Morbid obesity ([place])   3. Body mass index 40.0-44.9, adult [facility])     Plan: Patient is a pleasant 67 year old female who presents to our clinic today 39 days status post right total knee arthroplasty, date of surgery 12/04/2017.  She has been doing fairly well.  She has been in outpatient physical therapy making great progress.  Examination of her right knee reveals well-healed surgical incision without evidence of infection.  Range of motion from 0 to 100 degrees.  She is stable to valgus varus stress.  She is neurovascularly intact distally.  At this point, she will continue with formal physical therapy as needed.  Wean from this when ready.  She will continue with her home exercise program.  Follow-up with [place] in 7 weeks time when she is 12 weeks out from surgery.  The patient meets the AMA guidelines for Morbid (severe) obesity with a BMI > 40.0 and I have recommended weight loss.  Follow-Up Instructions: Return in about 7 weeks (around 03/02/2018).   Orders:  Orders Placed This Encounter  Procedures  . XR KNEE 3 VIEW RIGHT   No orders of the defined types were placed in this encounter.   Imaging: Xr Knee 3 View Right  Result Date: 01/12/2018 X-rays demonstrate a well-seated prosthesis without evidence of subsidence   PMFS History: Patient Active Problem List   Diagnosis Date Noted  . Body mass index 40.0-44.9, adult ([place]) 12/15/2017  . Osteoarthritis of right knee 12/04/2017  . Morbid obesity ([place]) 07/03/2017  . S/P TKR (total knee replacement), right 11/17/2016  . PCP NOTES >>>>>>>>>>> 06/22/2015  . Annual physical exam 06/09/2014  . GERD  (gastroesophageal reflux disease) 06/09/2014  . Hyperlipidemia 09/10/2010  . Anemia 08/11/2010  . HTN (hypertension) 02/27/2008  . ECHOCARDIOGRAM, ABNORMAL 02/27/2008  . Hypothyroidism 04/13/2007  . INSOMNIA, CHRONIC 04/13/2007  . Osteoarthritis 04/13/2007   Past Medical History:  Diagnosis Date  . GERD (gastroesophageal reflux disease)   . History of kidney stones   . Hyperlipidemia 09/10/2010  . Hypertension    no meds  . Hypothyroidism   . Normal cardiac stress test 6-12   had CP-DOE  . Osteoarthritis   . Wears dentures    top  . Wears glasses     Family History  Problem Relation Age of Onset  . Hypertension Mother   . Coronary artery disease Mother        onset?  Marland Kitchen Hypertension Sister   . Colon polyps Other        niece  . Diabetes Neg Hx   . Colon cancer Neg Hx   . Breast cancer Neg Hx   . Stroke Neg Hx   . Rectal cancer Neg Hx   . Stomach cancer Neg Hx     Past Surgical History:  Procedure Laterality Date  . ABDOMINAL HYSTERECTOMY  2000   B oophoreectomy, d/t DUB  . COLONOSCOPY  2004, 2011  . CYSTOSCOPY/URETEROSCOPY/HOLMIUM LASER/STENT PLACEMENT Right 11/17/2017   Procedure: CYSTOSCOPY/RIGHT URETEROSCOPY/RIGHT HOLMIUM LASER/STENT PLACEMENT;  Surgeon: Ellison Hughs  Marjory Lies, MD;  Location: [facility];  Service: Urology;  Laterality: Right;  . JOINT REPLACEMENT    . LITHOTRIPSY  2004   urolthiasis   . MASS EXCISION  09/12/2011   Procedure: EXCISION MASS;  Surgeon: Cristine Polio, MD;  Location: [place];  Service: Plastics;  Laterality: Left;  left ring finger  . POLYPECTOMY     TA 2004  . THYROID LOBECTOMY Left 1990s  . TOTAL KNEE ARTHROPLASTY Left 11/17/2016   Procedure: LEFT TOTAL KNEE ARTHROPLASTY;  Surgeon: Leandrew Koyanagi, MD;  Location: [place];  Service: Orthopedics;  Laterality: Left;  . TOTAL KNEE ARTHROPLASTY Right 12/04/2017  . TOTAL KNEE ARTHROPLASTY Right 12/04/2017   Procedure: RIGHT TOTAL KNEE ARTHROPLASTY;  Surgeon: Leandrew Koyanagi,  MD;  Location: [place];  Service: Orthopedics;  Laterality: Right;   Social History   Occupational History  . 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 Smoking status: Never Smoker  . Smokeless tobacco: Never Used  Substance and Sexual Activity  . Alcohol use: Yes    Alcohol/week: 0.0 standard drinks    Comment: 12/04/2017 "glass of wine maybe once/month"  . Drug use: Never  . Sexual activity: Not Currently

## 2018-01-12 NOTE — Addendum Note (Signed)
Addended by: Precious Bard on: 01/12/2018 03:03 PM   Modules accepted: Orders

## 2018-01-15 DIAGNOSIS — M25461 Effusion, right knee: Secondary | ICD-10-CM | POA: Diagnosis not present

## 2018-01-15 DIAGNOSIS — R262 Difficulty in walking, not elsewhere classified: Secondary | ICD-10-CM | POA: Diagnosis not present

## 2018-01-15 DIAGNOSIS — M25661 Stiffness of right knee, not elsewhere classified: Secondary | ICD-10-CM | POA: Diagnosis not present

## 2018-01-15 DIAGNOSIS — M25561 Pain in right knee: Secondary | ICD-10-CM | POA: Diagnosis not present

## 2018-01-17 DIAGNOSIS — M25461 Effusion, right knee: Secondary | ICD-10-CM | POA: Diagnosis not present

## 2018-01-17 DIAGNOSIS — M25661 Stiffness of right knee, not elsewhere classified: Secondary | ICD-10-CM | POA: Diagnosis not present

## 2018-01-17 DIAGNOSIS — R262 Difficulty in walking, not elsewhere classified: Secondary | ICD-10-CM | POA: Diagnosis not present

## 2018-01-17 DIAGNOSIS — M25561 Pain in right knee: Secondary | ICD-10-CM | POA: Diagnosis not present

## 2018-01-22 DIAGNOSIS — M25561 Pain in right knee: Secondary | ICD-10-CM | POA: Diagnosis not present

## 2018-01-22 DIAGNOSIS — M25461 Effusion, right knee: Secondary | ICD-10-CM | POA: Diagnosis not present

## 2018-01-22 DIAGNOSIS — R262 Difficulty in walking, not elsewhere classified: Secondary | ICD-10-CM | POA: Diagnosis not present

## 2018-01-22 DIAGNOSIS — M25661 Stiffness of right knee, not elsewhere classified: Secondary | ICD-10-CM | POA: Diagnosis not present

## 2018-01-25 DIAGNOSIS — M25561 Pain in right knee: Secondary | ICD-10-CM | POA: Diagnosis not present

## 2018-01-25 DIAGNOSIS — M25461 Effusion, right knee: Secondary | ICD-10-CM | POA: Diagnosis not present

## 2018-01-25 DIAGNOSIS — M25661 Stiffness of right knee, not elsewhere classified: Secondary | ICD-10-CM | POA: Diagnosis not present

## 2018-01-25 DIAGNOSIS — R262 Difficulty in walking, not elsewhere classified: Secondary | ICD-10-CM | POA: Diagnosis not present

## 2018-01-29 DIAGNOSIS — M25461 Effusion, right knee: Secondary | ICD-10-CM | POA: Diagnosis not present

## 2018-01-29 DIAGNOSIS — M25561 Pain in right knee: Secondary | ICD-10-CM | POA: Diagnosis not present

## 2018-01-29 DIAGNOSIS — R262 Difficulty in walking, not elsewhere classified: Secondary | ICD-10-CM | POA: Diagnosis not present

## 2018-01-29 DIAGNOSIS — M25661 Stiffness of right knee, not elsewhere classified: Secondary | ICD-10-CM | POA: Diagnosis not present

## 2018-01-31 DIAGNOSIS — M25661 Stiffness of right knee, not elsewhere classified: Secondary | ICD-10-CM | POA: Diagnosis not present

## 2018-01-31 DIAGNOSIS — R262 Difficulty in walking, not elsewhere classified: Secondary | ICD-10-CM | POA: Diagnosis not present

## 2018-01-31 DIAGNOSIS — M25561 Pain in right knee: Secondary | ICD-10-CM | POA: Diagnosis not present

## 2018-01-31 DIAGNOSIS — M25461 Effusion, right knee: Secondary | ICD-10-CM | POA: Diagnosis not present

## 2018-02-05 DIAGNOSIS — R262 Difficulty in walking, not elsewhere classified: Secondary | ICD-10-CM | POA: Diagnosis not present

## 2018-02-05 DIAGNOSIS — M25661 Stiffness of right knee, not elsewhere classified: Secondary | ICD-10-CM | POA: Diagnosis not present

## 2018-02-05 DIAGNOSIS — M25561 Pain in right knee: Secondary | ICD-10-CM | POA: Diagnosis not present

## 2018-02-05 DIAGNOSIS — M25461 Effusion, right knee: Secondary | ICD-10-CM | POA: Diagnosis not present

## 2018-02-06 DIAGNOSIS — M25661 Stiffness of right knee, not elsewhere classified: Secondary | ICD-10-CM | POA: Diagnosis not present

## 2018-02-06 DIAGNOSIS — R262 Difficulty in walking, not elsewhere classified: Secondary | ICD-10-CM | POA: Diagnosis not present

## 2018-02-06 DIAGNOSIS — M25561 Pain in right knee: Secondary | ICD-10-CM | POA: Diagnosis not present

## 2018-02-06 DIAGNOSIS — M25461 Effusion, right knee: Secondary | ICD-10-CM | POA: Diagnosis not present

## 2018-02-11 ENCOUNTER — Other Ambulatory Visit: Payer: Self-pay | Admitting: Internal Medicine

## 2018-02-13 DIAGNOSIS — R262 Difficulty in walking, not elsewhere classified: Secondary | ICD-10-CM | POA: Diagnosis not present

## 2018-02-13 DIAGNOSIS — M25661 Stiffness of right knee, not elsewhere classified: Secondary | ICD-10-CM | POA: Diagnosis not present

## 2018-02-13 DIAGNOSIS — M25461 Effusion, right knee: Secondary | ICD-10-CM | POA: Diagnosis not present

## 2018-02-13 DIAGNOSIS — M25561 Pain in right knee: Secondary | ICD-10-CM | POA: Diagnosis not present

## 2018-02-14 ENCOUNTER — Other Ambulatory Visit: Payer: Self-pay | Admitting: Internal Medicine

## 2018-02-14 DIAGNOSIS — M25561 Pain in right knee: Secondary | ICD-10-CM

## 2018-02-14 DIAGNOSIS — M25562 Pain in left knee: Principal | ICD-10-CM

## 2018-02-16 ENCOUNTER — Other Ambulatory Visit: Payer: Self-pay | Admitting: Internal Medicine

## 2018-02-17 ENCOUNTER — Other Ambulatory Visit: Payer: Self-pay | Admitting: Internal Medicine

## 2018-02-19 DIAGNOSIS — M25661 Stiffness of right knee, not elsewhere classified: Secondary | ICD-10-CM | POA: Diagnosis not present

## 2018-02-19 DIAGNOSIS — R262 Difficulty in walking, not elsewhere classified: Secondary | ICD-10-CM | POA: Diagnosis not present

## 2018-02-19 DIAGNOSIS — M25461 Effusion, right knee: Secondary | ICD-10-CM | POA: Diagnosis not present

## 2018-02-19 DIAGNOSIS — M25561 Pain in right knee: Secondary | ICD-10-CM | POA: Diagnosis not present

## 2018-02-20 ENCOUNTER — Other Ambulatory Visit: Payer: Self-pay

## 2018-02-20 ENCOUNTER — Ambulatory Visit (INDEPENDENT_AMBULATORY_CARE_PROVIDER_SITE_OTHER): Payer: Medicare HMO | Admitting: Internal Medicine

## 2018-02-20 ENCOUNTER — Encounter: Payer: Self-pay | Admitting: Internal Medicine

## 2018-02-20 VITALS — BP 134/76 | HR 76 | Temp 97.6°F | Resp 16 | Ht 63.0 in | Wt 250.1 lb

## 2018-02-20 DIAGNOSIS — D649 Anemia, unspecified: Secondary | ICD-10-CM | POA: Diagnosis not present

## 2018-02-20 DIAGNOSIS — I10 Essential (primary) hypertension: Secondary | ICD-10-CM

## 2018-02-20 DIAGNOSIS — E039 Hypothyroidism, unspecified: Secondary | ICD-10-CM

## 2018-02-20 DIAGNOSIS — Z96651 Presence of right artificial knee joint: Secondary | ICD-10-CM

## 2018-02-20 LAB — CBC WITH DIFFERENTIAL/PLATELET
BASOS PCT: 1.5 % (ref 0.0–3.0)
Basophils Absolute: 0.1 10*3/uL (ref 0.0–0.1)
EOS ABS: 0.2 10*3/uL (ref 0.0–0.7)
Eosinophils Relative: 3.7 % (ref 0.0–5.0)
HCT: 36.9 % (ref 36.0–46.0)
Hemoglobin: 12.2 g/dL (ref 12.0–15.0)
LYMPHS PCT: 39.9 % (ref 12.0–46.0)
Lymphs Abs: 2.1 10*3/uL (ref 0.7–4.0)
MCHC: 33 g/dL (ref 30.0–36.0)
MCV: 91.2 fl (ref 78.0–100.0)
Monocytes Absolute: 0.8 10*3/uL (ref 0.1–1.0)
Monocytes Relative: 15.5 % — ABNORMAL HIGH (ref 3.0–12.0)
NEUTROS PCT: 39.4 % — AB (ref 43.0–77.0)
Neutro Abs: 2 10*3/uL (ref 1.4–7.7)
PLATELETS: 229 10*3/uL (ref 150.0–400.0)
RBC: 4.05 Mil/uL (ref 3.87–5.11)
RDW: 14.5 % (ref 11.5–15.5)
WBC: 5.1 10*3/uL (ref 4.0–10.5)

## 2018-02-20 LAB — BASIC METABOLIC PANEL
BUN: 13 mg/dL (ref 6–23)
CHLORIDE: 102 meq/L (ref 96–112)
CO2: 31 mEq/L (ref 19–32)
CREATININE: 0.85 mg/dL (ref 0.40–1.20)
Calcium: 9.8 mg/dL (ref 8.4–10.5)
GFR: 85.22 mL/min (ref >60.00)
Glucose, Bld: 95 mg/dL (ref 70–99)
POTASSIUM: 4.3 meq/L (ref 3.5–5.1)
Sodium: 141 mEq/L (ref 135–145)

## 2018-02-20 LAB — TSH: TSH: 3.29 u[IU]/mL (ref 0.35–4.50)

## 2018-02-20 MED ORDER — MELOXICAM 7.5 MG PO TABS: 7.5000 mg | ORAL_TABLET | Freq: Every day | ORAL | 1 refills | Status: DC | PRN

## 2018-02-20 MED ORDER — ZOLPIDEM TARTRATE 10 MG PO TABS: 10.0000 mg | ORAL_TABLET | Freq: Every day | ORAL | 3 refills | Status: DC

## 2018-02-20 NOTE — Progress Notes (Signed)
Subjective:    Patient ID: Kelly Perkins, female    DOB: 1951/02/02, 67 y.o.   MRN: [id number]  DOS:  02/20/2018 Type of visit - description : f/u HTN: Good compliance with medication. DJD: Status post right TKR, still having some pain, to finish PT soon.  Request something for DJD pain (not specifically for the knee). Insomnia: Needs a refill on Ambien.  Review of Systems History of urolithiasis.  Currently asymptomatic  Past Medical History:  Diagnosis Date  . GERD (gastroesophageal reflux disease)   . History of kidney stones   . Hyperlipidemia 09/10/2010  . Hypertension    no meds  . Hypothyroidism   . Normal cardiac stress test 6-12   had CP-DOE  . Osteoarthritis   . Wears dentures    top  . Wears glasses     Past Surgical History:  Procedure Laterality Date  . ABDOMINAL HYSTERECTOMY  2000   B oophoreectomy, d/t DUB  . COLONOSCOPY  2004, 2011  . CYSTOSCOPY/URETEROSCOPY/HOLMIUM LASER/STENT PLACEMENT Right 11/17/2017   Procedure: CYSTOSCOPY/RIGHT URETEROSCOPY/RIGHT HOLMIUM LASER/STENT PLACEMENT;  Surgeon: Ceasar Mons, MD;  [facility];  Service: Urology;  Laterality: Right;  . JOINT REPLACEMENT    . LITHOTRIPSY  2004   urolthiasis   . MASS EXCISION  09/12/2011   Procedure: EXCISION MASS;  Surgeon: Cristine Polio, MD;  Location: [place];  Service: Plastics;  Laterality: Left;  left ring finger  . POLYPECTOMY     TA 2004  . THYROID LOBECTOMY Left 1990s  . TOTAL KNEE ARTHROPLASTY Left 11/17/2016   Procedure: LEFT TOTAL KNEE ARTHROPLASTY;  Surgeon: Leandrew Koyanagi, MD;  Location: [place];  Service: Orthopedics;  Laterality: Left;  . TOTAL KNEE ARTHROPLASTY Right 12/04/2017  . TOTAL KNEE ARTHROPLASTY Right 12/04/2017   Procedure: RIGHT TOTAL KNEE ARTHROPLASTY;  Surgeon: Leandrew Koyanagi, MD;  Location: [place];  Service: Orthopedics;  Laterality: Right;    Social History   Socioeconomic History  . Marital status: Divorced    Spouse name:  Not on file  . Number of children: 2  . Years of education: Not on file  . Highest education level: Not on file  Occupational History  . Occupation: school bus driver. Retired-- [employer]  . Financial resource strain: Not on file  . Food insecurity:    Worry: Not on file    Inability: Not on file  . Transportation needs:    Medical: Not on file    Non-medical: Not on file  Tobacco Use  . Smoking status: Never Smoker  . Smokeless tobacco: Never Used  Substance and Sexual Activity  . Alcohol use: Yes    Alcohol/week: 0.0 standard drinks    Comment: 12/04/2017 "glass of wine maybe once/month"  . Drug use: Never  . Sexual activity: Not Currently  Lifestyle  . Physical activity:    Days per week: Not on file    Minutes per session: Not on file  . Stress: Not on file  Relationships  . Social connections:    Talks on phone: Not on file    Gets together: Not on file    Attends religious service: Not on file    Active member of club or organization: Not on file    Attends meetings of clubs or organizations: Not on file    Relationship status: Not on file  . Intimate partner violence:    Fear of current or ex partner: Not on file  Emotionally abused: Not on file    Physically abused: Not on file    Forced sexual activity: Not on file  Other Topics Concern  . Not on file  Social History Narrative   Single, mom is one of my patients [name].           Allergies as of 02/20/2018   No Known Allergies     Medication List        Accurate as of 02/20/18 11:59 PM. Always use your most recent med list.          aspirin EC 81 MG tablet Take 1 tablet (81 mg total) by mouth 2 (two) times daily.   levothyroxine 150 MCG tablet Commonly known as:  SYNTHROID, LEVOTHROID Take 1 tablet (150 mcg total) by mouth daily before breakfast.   lisinopril-hydrochlorothiazide 10-12.5 MG tablet Commonly known as:  PRINZIDE,ZESTORETIC Take 1 tablet by mouth daily.     meloxicam 7.5 MG tablet Commonly known as:  MOBIC Take 1 tablet (7.5 mg total) by mouth daily as needed for pain.   pantoprazole 40 MG tablet Commonly known as:  PROTONIX Take 1 tablet (40 mg total) by mouth daily.   simvastatin 20 MG tablet Commonly known as:  ZOCOR Take 1 tablet (20 mg total) by mouth at bedtime.   traMADol 50 MG tablet Commonly known as:  ULTRAM TAKE 1-2 TABS Q6-8 HOURS PRN PAIN   zolpidem 10 MG tablet Commonly known as:  AMBIEN Take 1 tablet (10 mg total) by mouth at bedtime.           Objective:   Physical Exam BP 134/76 (BP Location: Right Arm, Patient Position: Sitting, Cuff Size: Normal)   Pulse 76   Temp 97.6 F (36.4 C) (Oral)   Resp 16   Ht 5\' 3"  (1.6 m)   Wt 250 lb 2 oz (113.5 kg)   SpO2 98%   BMI 44.31 kg/m  General:   Well developed, NAD, BMI noted. HEENT:  Normocephalic . Face symmetric, atraumatic Lungs:  CTA B Normal respiratory effort, no intercostal retractions, no accessory muscle use. Heart: RRR,  no murmur.  No pretibial edema bilaterally  Skin: Not pale. Not jaundice Neurologic:  alert & oriented X3.  Speech normal, gait limited by knee pain, status post surgery few weeks ago. Psych--  Cognition and judgment appear intact.  Cooperative with normal attention span and concentration.  Behavior appropriate. No anxious or depressed appearing.      Assessment & Plan:    Assessment HTN Hyperlipidemia Insomnia-- ambien Hypothyroidism, s/p thyroidectomy Morbid obesity GERD Mild anemia, normal iron 12-2013, cscope 08-2015 DJD -- tramadol rarely  h/o kidney stones  CP-DOE: Normal stress test 08/2010  PLAN:  HTN: Currently on lisinopril HCT.  Check a BMP Insomnia: Refill Ambien Hypothyroidism: Check a TSH, continue Synthroid. Kidney stones: Since the last office visit, had a right ureteral stone, s/p cystography and ureteroscopy.  Currently asx, will see urology. DJD: Previously on meloxicam, Ultram, Robaxin.   Currently on Tylenol and rarely Ultram.  Recuperating from recent right TKR.  Plan: Tylenol 2 or 3 times a day.  Meloxicam 3-4 times a week.  Ultram as needed at bedtime.  Consider reintroduce Flexeril. Morbid obesity: Hopefully managing DJD pain better and the TKR will help her increase her mobility and exercise.   RTC 4 m

## 2018-02-20 NOTE — Progress Notes (Signed)
Pre visit review using our clinic review tool, if applicable. No additional management support is needed unless otherwise documented below in the visit note. 

## 2018-02-20 NOTE — Patient Instructions (Addendum)
Please schedule Medicare Wellness with Glenard Haring.   GO TO THE LAB : Get the blood work     GO TO THE FRONT DESK Schedule your next appointment for a  Check up in 3-4 months   For pain: Tylenol  500 mg OTC 2 tabs a day every 8 hours as needed for pain For daytime pain: take a meloxicam once daily, no more than 4 times a week.  Take it with food to prevent gastritis.  Stop if you have any stomach issues For nighttime pain: Ultram as needed

## 2018-02-21 DIAGNOSIS — M25561 Pain in right knee: Secondary | ICD-10-CM | POA: Diagnosis not present

## 2018-02-21 DIAGNOSIS — M25461 Effusion, right knee: Secondary | ICD-10-CM | POA: Diagnosis not present

## 2018-02-21 DIAGNOSIS — R262 Difficulty in walking, not elsewhere classified: Secondary | ICD-10-CM | POA: Diagnosis not present

## 2018-02-21 DIAGNOSIS — M25661 Stiffness of right knee, not elsewhere classified: Secondary | ICD-10-CM | POA: Diagnosis not present

## 2018-02-21 NOTE — Assessment & Plan Note (Addendum)
HTN: Currently on lisinopril HCT.  Check a BMP Insomnia: Refill Ambien Hypothyroidism: Check a TSH, continue Synthroid. Kidney stones: Since the last office visit, had a right ureteral stone, s/p cystography and ureteroscopy.  Currently asx, will see urology. DJD: Previously on meloxicam, Ultram, Robaxin.  Currently on Tylenol and rarely Ultram.  Recuperating from recent right TKR.  Plan: Tylenol 2 or 3 times a day.  Meloxicam 3-4 times a week.  Ultram as needed at bedtime.  Consider reintroduce Flexeril.  Morbid obesity: Hopefully managing DJD pain better and the TKR will help her increase her mobility and exercise.   RTC 4 m

## 2018-02-23 ENCOUNTER — Encounter (INDEPENDENT_AMBULATORY_CARE_PROVIDER_SITE_OTHER): Payer: Self-pay | Admitting: Orthopaedic Surgery

## 2018-02-23 ENCOUNTER — Ambulatory Visit (INDEPENDENT_AMBULATORY_CARE_PROVIDER_SITE_OTHER): Payer: Medicare HMO | Admitting: Physician Assistant

## 2018-02-23 DIAGNOSIS — Z96651 Presence of right artificial knee joint: Secondary | ICD-10-CM

## 2018-02-23 MED ORDER — TRAMADOL HCL 50 MG PO TABS: ORAL_TABLET | ORAL | 0 refills | Status: DC

## 2018-02-23 NOTE — Progress Notes (Signed)
Post-Op Visit Note   Patient: Kelly Perkins           Date of Birth: Jul 21, 1950           MRN: [id number] Visit Date: 02/23/2018 PCP: Colon Branch, MD   Assessment & Plan:  Chief Complaint:  Chief Complaint  Patient presents with  . Right Knee - Pain   Visit Diagnoses:  1. S/P TKR (total knee replacement), right     Plan: Patient is a pleasant 67 year old female who presents to our clinic today 81 days status post right total knee replacement, date of surgery 12/04/2017.  She has been doing very well.  She still gets occasional stiffness in the morning and increased pain at night when she lays down to go to sleep, but otherwise doing much better.  She has recently finished formal physical therapy where she has made great gains with range of motion and strength.  Examination of her right knee reveals well-healed surgical incision without evidence of infection.  Range of motion 0 to 115 degrees.  Stable valgus varus stress.  Calf is soft and nontender.  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  She will follow-up with [place] in 3 months time for repeat evaluation and 2 view x-rays of the right knee.  Follow-Up Instructions: Return in about 3 months (around 05/25/2018).   Orders:  No orders of the defined types were placed in this encounter.  No orders of the defined types were placed in this encounter.   Imaging: No new imaging  PMFS History: Patient Active Problem List   Diagnosis Date Noted  . Body mass index 40.0-44.9, adult ([place]) 12/15/2017  . Morbid obesity ([place]) 07/03/2017  . S/P TKR (total knee replacement), right 11/17/2016  . PCP NOTES >>>>>>>>>>> 06/22/2015  . Annual physical exam 06/09/2014  . GERD (gastroesophageal reflux disease) 06/09/2014  . Hyperlipidemia 09/10/2010  . Anemia 08/11/2010  . HTN (hypertension) 02/27/2008  . ECHOCARDIOGRAM, ABNORMAL 02/27/2008  . Hypothyroidism 04/13/2007  . INSOMNIA, CHRONIC 04/13/2007  . Osteoarthritis  04/13/2007   Past Medical History:  Diagnosis Date  . GERD (gastroesophageal reflux disease)   . History of kidney stones   . Hyperlipidemia 09/10/2010  . Hypertension    no meds  . Hypothyroidism   . Normal cardiac stress test 6-12   had CP-DOE  . Osteoarthritis   . Wears dentures    top  . Wears glasses     Family History  Problem Relation Age of Onset  . Hypertension Mother   . Coronary artery disease Mother        onset?  Marland Kitchen Hypertension Sister   . Colon polyps Other        niece  . Diabetes Neg Hx   . Colon cancer Neg Hx   . Breast cancer Neg Hx   . Stroke Neg Hx   . Rectal cancer Neg Hx   . Stomach cancer Neg Hx     Past Surgical History:  Procedure Laterality Date  . ABDOMINAL HYSTERECTOMY  2000   B oophoreectomy, d/t DUB  . COLONOSCOPY  2004, 2011  . CYSTOSCOPY/URETEROSCOPY/HOLMIUM LASER/STENT PLACEMENT Right 11/17/2017   Procedure: CYSTOSCOPY/RIGHT URETEROSCOPY/RIGHT HOLMIUM LASER/STENT PLACEMENT;  Surgeon: Ceasar Mons, MD;  [facility];  Service: Urology;  Laterality: Right;  . JOINT REPLACEMENT    . LITHOTRIPSY  2004   urolthiasis   . MASS EXCISION  09/12/2011   Procedure: EXCISION MASS;  Surgeon:  Cristine Polio, MD;  Location: [place];  Service: Plastics;  Laterality: Left;  left ring finger  . POLYPECTOMY     TA 2004  . THYROID LOBECTOMY Left 1990s  . TOTAL KNEE ARTHROPLASTY Left 11/17/2016   Procedure: LEFT TOTAL KNEE ARTHROPLASTY;  Surgeon: Leandrew Koyanagi, MD;  Location: [place];  Service: Orthopedics;  Laterality: Left;  . TOTAL KNEE ARTHROPLASTY Right 12/04/2017  . TOTAL KNEE ARTHROPLASTY Right 12/04/2017   Procedure: RIGHT TOTAL KNEE ARTHROPLASTY;  Surgeon: Leandrew Koyanagi, MD;  Location: [place];  Service: Orthopedics;  Laterality: Right;   Social History   Occupational History  . 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 Smoking status: Never Smoker  . Smokeless tobacco: Never Used  Substance  and Sexual Activity  . Alcohol use: Yes    Alcohol/week: 0.0 standard drinks    Comment: 12/04/2017 "glass of wine maybe once/month"  . Drug use: Never  . Sexual activity: Not Currently

## 2018-02-23 NOTE — Addendum Note (Signed)
Addended by: Precious Bard on: 02/23/2018 10:40 AM   Modules accepted: Orders

## 2018-04-08 ENCOUNTER — Telehealth: Payer: Self-pay | Admitting: Internal Medicine

## 2018-04-08 DIAGNOSIS — M25562 Pain in left knee: Principal | ICD-10-CM

## 2018-04-08 DIAGNOSIS — M25561 Pain in right knee: Secondary | ICD-10-CM

## 2018-04-09 MED ORDER — CYCLOBENZAPRINE HCL 10 MG PO TABS: 10.0000 mg | ORAL_TABLET | Freq: Every evening | ORAL | 1 refills | Status: DC | PRN

## 2018-04-09 NOTE — Telephone Encounter (Signed)
Patient wants to know if someone can reach out to her and explain why med was denied

## 2018-04-09 NOTE — Telephone Encounter (Signed)
Rx sent 

## 2018-04-09 NOTE — Telephone Encounter (Signed)
Okay to send Flexeril 10 mg 1 p.o. nightly #30 and 1 refill. Please advise patient not to mix it with other muscle relaxants that her orthopedic doctor might be prescribing.

## 2018-04-09 NOTE — Telephone Encounter (Signed)
Pt requesting refill on Flexeril- no longer on med list. Please advise if okay to refill.

## 2018-04-09 NOTE — Addendum Note (Signed)
Addended byDamita Dunnings D on: 04/09/2018 12:12 PM   Modules accepted: Orders

## 2018-04-14 ENCOUNTER — Other Ambulatory Visit: Payer: Self-pay | Admitting: Internal Medicine

## 2018-04-21 ENCOUNTER — Other Ambulatory Visit (INDEPENDENT_AMBULATORY_CARE_PROVIDER_SITE_OTHER): Payer: Self-pay | Admitting: Physician Assistant

## 2018-04-24 NOTE — Telephone Encounter (Signed)
Ok to refill 

## 2018-05-11 ENCOUNTER — Other Ambulatory Visit: Payer: Self-pay | Admitting: Internal Medicine

## 2018-05-14 ENCOUNTER — Other Ambulatory Visit: Payer: Self-pay | Admitting: Internal Medicine

## 2018-05-20 DIAGNOSIS — J329 Chronic sinusitis, unspecified: Secondary | ICD-10-CM | POA: Diagnosis not present

## 2018-05-20 DIAGNOSIS — R062 Wheezing: Secondary | ICD-10-CM | POA: Diagnosis not present

## 2018-05-20 DIAGNOSIS — J40 Bronchitis, not specified as acute or chronic: Secondary | ICD-10-CM | POA: Diagnosis not present

## 2018-05-20 DIAGNOSIS — H6691 Otitis media, unspecified, right ear: Secondary | ICD-10-CM | POA: Diagnosis not present

## 2018-05-29 ENCOUNTER — Ambulatory Visit (INDEPENDENT_AMBULATORY_CARE_PROVIDER_SITE_OTHER): Payer: Medicare HMO | Admitting: Orthopaedic Surgery

## 2018-05-30 ENCOUNTER — Other Ambulatory Visit (INDEPENDENT_AMBULATORY_CARE_PROVIDER_SITE_OTHER): Payer: Self-pay | Admitting: Physician Assistant

## 2018-06-06 ENCOUNTER — Encounter: Payer: Self-pay | Admitting: Internal Medicine

## 2018-06-06 ENCOUNTER — Telehealth (INDEPENDENT_AMBULATORY_CARE_PROVIDER_SITE_OTHER): Payer: Medicare HMO | Admitting: Internal Medicine

## 2018-06-06 ENCOUNTER — Other Ambulatory Visit: Payer: Self-pay

## 2018-06-06 DIAGNOSIS — E039 Hypothyroidism, unspecified: Secondary | ICD-10-CM | POA: Diagnosis not present

## 2018-06-06 DIAGNOSIS — Z09 Encounter for follow-up examination after completed treatment for conditions other than malignant neoplasm: Secondary | ICD-10-CM

## 2018-06-06 DIAGNOSIS — E785 Hyperlipidemia, unspecified: Secondary | ICD-10-CM | POA: Diagnosis not present

## 2018-06-06 DIAGNOSIS — I10 Essential (primary) hypertension: Secondary | ICD-10-CM

## 2018-06-06 MED ORDER — ALBUTEROL SULFATE HFA 108 (90 BASE) MCG/ACT IN AERS: 2.0000 | INHALATION_SPRAY | Freq: Four times a day (QID) | RESPIRATORY_TRACT | 2 refills | Status: DC | PRN

## 2018-06-06 NOTE — Progress Notes (Signed)
Virtual Visit via Telephone Note  I connected with Kelly Perkins on 06/06/18 at  1:00 PM EDT by telephone and verified that I am speaking with the correct person using two identifiers.   I discussed the limitations, risks, security and privacy concerns of performing an evaluation and management service by telephone and the availability of in person appointments. I also discussed with the patient that there may be a patient responsible charge related to this service. The patient expressed understanding and agreed to proceed.   History of Present Illness: We did a phone visit for her routine care. We review her medications We review her labs Additionally, she was diagnosed with bronchitis and a ear infection a couple of weeks ago.  She is better except for a persisting, mild cough. She has inhaler that he uses twice a day, thinks that is "probably helping".  ROS: No fever chills No chest pain or actual difficulty breathing. No edema.    Observations/Objective: She sounded well over the phone  Assessment and Plan:  Assessment HTN Hyperlipidemia Insomnia-- ambien Hypothyroidism, s/p thyroidectomy Morbid obesity GERD Mild anemia, normal iron 12-2013, cscope 08-2015 DJD -- tramadol rarely  h/o kidney stones  CP-DOE: Normal stress test 08/2010  PLAN:   Telephone visit HTN: Continue Zestoretic, last BMP satisfactory. High cholesterol: On simvastatin, check a FLP, AST, ALT Hypothyroidism: On Synthroid, check a TSH Cough: Had a bronchitis 2 weeks ago, better except for persistent dry cough.  Some wheezing.  No fever.  Recommend to continue using albuterol as needed, Robitussin-DM, call if not gradually improving.  Follow Up Instructions: Come back tomorrow at 1 PM for blood work: FLP, AST, ALT (high cholesterol), TSH (hypothyroidism) Next visit in 3 months, CPX   I discussed the assessment and treatment plan with the patient. The patient was provided an opportunity to ask  questions and all were answered. The patient agreed with the plan and demonstrated an understanding of the instructions.   The patient was advised to call back or seek an in-person evaluation if the symptoms worsen or if the condition fails to improve as anticipated.  I provided 20 minutes of non-face-to-face time during this encounter.   Kathlene November, MD

## 2018-06-07 ENCOUNTER — Other Ambulatory Visit (INDEPENDENT_AMBULATORY_CARE_PROVIDER_SITE_OTHER): Payer: Medicare HMO

## 2018-06-07 ENCOUNTER — Other Ambulatory Visit: Payer: Self-pay

## 2018-06-07 DIAGNOSIS — E785 Hyperlipidemia, unspecified: Secondary | ICD-10-CM | POA: Diagnosis not present

## 2018-06-07 DIAGNOSIS — E039 Hypothyroidism, unspecified: Secondary | ICD-10-CM

## 2018-06-07 LAB — LIPID PANEL
Cholesterol: 182 mg/dL (ref 0–200)
HDL: 41.8 mg/dL (ref >39.00)
LDL Cholesterol: 108 mg/dL — ABNORMAL HIGH (ref 0–99)
NonHDL: 139.86
Total CHOL/HDL Ratio: 4
Triglycerides: 158 mg/dL — ABNORMAL HIGH (ref 0.0–149.0)
VLDL: 31.6 mg/dL (ref 0.0–40.0)

## 2018-06-07 LAB — AST: AST: 26 U/L (ref 0–37)

## 2018-06-07 LAB — ALT: ALT: 38 U/L — AB (ref 0–35)

## 2018-06-07 LAB — TSH: TSH: 2.94 u[IU]/mL (ref 0.35–4.50)

## 2018-06-07 NOTE — Assessment & Plan Note (Signed)
Telephone visit HTN: Continue Zestoretic, last BMP satisfactory. High cholesterol: On simvastatin, check a FLP, AST, ALT Hypothyroidism: On Synthroid, check a TSH Cough: Had a bronchitis 2 weeks ago, better except for persistent dry cough.  Some wheezing.  No fever.  Recommend to continue using albuterol as needed, Robitussin-DM, call if not gradually improving.  Follow Up Instructions: Come back tomorrow at 1 PM for blood work: FLP, AST, ALT (high cholesterol), TSH (hypothyroidism) Next visit in 3 months, CPX

## 2018-06-13 ENCOUNTER — Other Ambulatory Visit: Payer: Self-pay | Admitting: Internal Medicine

## 2018-06-17 ENCOUNTER — Telehealth: Payer: Self-pay | Admitting: Internal Medicine

## 2018-06-18 NOTE — Telephone Encounter (Signed)
Sent!

## 2018-06-18 NOTE — Telephone Encounter (Signed)
Pt is requesting refill on Ambien.   Last OV; 06/06/2018 Last Fill: 02/20/2018 #30 and 3RF UDS: None

## 2018-07-12 ENCOUNTER — Encounter (INDEPENDENT_AMBULATORY_CARE_PROVIDER_SITE_OTHER): Payer: Self-pay | Admitting: Orthopaedic Surgery

## 2018-07-13 NOTE — Telephone Encounter (Signed)
Yes permanent

## 2018-08-05 ENCOUNTER — Other Ambulatory Visit: Payer: Self-pay | Admitting: Internal Medicine

## 2018-08-12 ENCOUNTER — Other Ambulatory Visit: Payer: Self-pay | Admitting: Internal Medicine

## 2018-09-03 ENCOUNTER — Ambulatory Visit: Payer: Medicare HMO | Admitting: Internal Medicine

## 2018-09-05 ENCOUNTER — Ambulatory Visit (INDEPENDENT_AMBULATORY_CARE_PROVIDER_SITE_OTHER): Payer: Medicare HMO | Admitting: Internal Medicine

## 2018-09-05 ENCOUNTER — Other Ambulatory Visit: Payer: Self-pay

## 2018-09-05 DIAGNOSIS — E039 Hypothyroidism, unspecified: Secondary | ICD-10-CM | POA: Diagnosis not present

## 2018-09-05 DIAGNOSIS — I10 Essential (primary) hypertension: Secondary | ICD-10-CM | POA: Diagnosis not present

## 2018-09-05 DIAGNOSIS — E785 Hyperlipidemia, unspecified: Secondary | ICD-10-CM

## 2018-09-05 DIAGNOSIS — M150 Primary generalized (osteo)arthritis: Secondary | ICD-10-CM

## 2018-09-05 DIAGNOSIS — M159 Polyosteoarthritis, unspecified: Secondary | ICD-10-CM

## 2018-09-05 NOTE — Progress Notes (Signed)
Subjective:    Patient ID: Kelly Perkins, female    DOB: 08-09-1950, 68 y.o.   MRN: [id number]  DOS:  09/05/2018 Type of visit - description: Attempted  to make this a video visit, due to technical difficulties from the patient side it was not possible  thus we proceeded with a Virtual Visit via Telephone    I connected with@ on 09/05/18 at  1:40 PM EDT by telephone and verified that I am speaking with the correct person using two identifiers.  THIS ENCOUNTER IS A VIRTUAL VISIT DUE TO COVID-19 - PATIENT WAS NOT SEEN IN THE OFFICE. PATIENT HAS CONSENTED TO VIRTUAL VISIT / TELEMEDICINE VISIT   Location of patient: home  Location of provider: office  I discussed the limitations, risks, security and privacy concerns of performing an evaluation and management service by telephone and the availability of in person appointments. I also discussed with the patient that there may be a patient responsible charge related to this service. The patient expressed understanding and agreed to proceed.   History of Present Illness: Routine office visit In general she feels well. We review her medication list, good compliance without apparent side effects DJD not as well-controlled as she would like to with current medications. Ambulatory BP is very good.     Review of Systems Denies fever chills No chest pain no difficulty breathing No nausea, vomiting, diarrhea. No cough No dysuria, gross hematuria or difficulty urinating  Past Medical History:  Diagnosis Date  . GERD (gastroesophageal reflux disease)   . History of kidney stones   . Hyperlipidemia 09/10/2010  . Hypertension    no meds  . Hypothyroidism   . Normal cardiac stress test 6-12   had CP-DOE  . Osteoarthritis   . Wears dentures    top  . Wears glasses     Past Surgical History:  Procedure Laterality Date  . ABDOMINAL HYSTERECTOMY  2000   B oophoreectomy, d/t DUB  . COLONOSCOPY  2004, 2011  .  CYSTOSCOPY/URETEROSCOPY/HOLMIUM LASER/STENT PLACEMENT Right 11/17/2017   Procedure: CYSTOSCOPY/RIGHT URETEROSCOPY/RIGHT HOLMIUM LASER/STENT PLACEMENT;  Surgeon: Ceasar Mons, MD;  Location: [facility];  Service: Urology;  Laterality: Right;  . JOINT REPLACEMENT    . LITHOTRIPSY  2004   urolthiasis   . MASS EXCISION  09/12/2011   Procedure: EXCISION MASS;  Surgeon: Cristine Polio, MD;  Location: [facility];  Service: Plastics;  Laterality: Left;  left ring finger  . POLYPECTOMY     TA 2004  . THYROID LOBECTOMY Left 1990s  . TOTAL KNEE ARTHROPLASTY Left 11/17/2016   Procedure: LEFT TOTAL KNEE ARTHROPLASTY;  Surgeon: Leandrew Koyanagi, MD;  Location: [facility];  Service: Orthopedics;  Laterality: Left;  . TOTAL KNEE ARTHROPLASTY Right 12/04/2017  . TOTAL KNEE ARTHROPLASTY Right 12/04/2017   Procedure: RIGHT TOTAL KNEE ARTHROPLASTY;  Surgeon: Leandrew Koyanagi, MD;  Location: [facility];  Service: Orthopedics;  Laterality: Right;    Social History   Socioeconomic History  . Marital status: Divorced    Spouse name: Not on file  . Number of children: 2  . Years of education: Not on file  . Highest education level: Not on file  Occupational History  . Occupation: school bus driver. Retired-- [employer]  . Financial resource strain: Not on file  . Food insecurity    Worry: Not on file    Inability: Not on file  . Transportation needs    Medical: Not on file  Non-medical: Not on file  Tobacco Use  . Smoking status: Never Smoker  . Smokeless tobacco: Never Used  Substance and Sexual Activity  . Alcohol use: Yes    Alcohol/week: 0.0 standard drinks    Comment: 12/04/2017 "glass of wine maybe once/month"  . Drug use: Never  . Sexual activity: Not Currently  Lifestyle  . Physical activity    Days per week: Not on file    Minutes per session: Not on file  . Stress: Not on file  Relationships  . Social Herbalist on phone: Not on file    Gets together: Not on  file    Attends religious service: Not on file    Active member of club or organization: Not on file    Attends meetings of clubs or organizations: Not on file    Relationship status: Not on file  . Intimate partner violence    Fear of current or ex partner: Not on file    Emotionally abused: Not on file    Physically abused: Not on file    Forced sexual activity: Not on file  Other Topics Concern  . Not on file  Social History Narrative   Single, mom is one of my patients Kelly Perkins.           Allergies as of 09/05/2018   No Known Allergies     Medication List       Accurate as of September 05, 2018  1:59 PM. If you have any questions, ask your nurse or doctor.        albuterol 108 (90 Base) MCG/ACT inhaler Commonly known as: VENTOLIN HFA Inhale 2 puffs into the lungs every 6 (six) hours as needed for wheezing or shortness of breath.   cyclobenzaprine 10 MG tablet Commonly known as: FLEXERIL TAKE 1 TABLET AT BEDTIME AS NEEDED FOR MUSCLE SPASMS. DO NOT MIX WITH ANY OTHER MUSCLE RELAXANTS   levothyroxine 150 MCG tablet Commonly known as: SYNTHROID Take 1 tablet (150 mcg total) by mouth daily before breakfast.   lisinopril-hydrochlorothiazide 10-12.5 MG tablet Commonly known as: ZESTORETIC Take 1 tablet by mouth daily.   meloxicam 7.5 MG tablet Commonly known as: MOBIC TAKE 1 TABLET BY MOUTH DAILY AS NEEDED FOR PAIN   pantoprazole 40 MG tablet Commonly known as: PROTONIX Take 1 tablet (40 mg total) by mouth daily.   simvastatin 20 MG tablet Commonly known as: ZOCOR Take 1 tablet (20 mg total) by mouth at bedtime.   traMADol 50 MG tablet Commonly known as: ULTRAM TAKE 1 TO 2 TABLETS EVERY 6 TO 8 HOURS AS NEEDED FOR PAIN   zolpidem 10 MG tablet Commonly known as: AMBIEN TAKE 1 TABLET BY MOUTH EVERYDAY AT BEDTIME           Objective:   Physical Exam There were no vitals taken for this visit. This is a virtual phone visit, alert oriented x3, no apparent  distress    Assessment     Assessment HTN Hyperlipidemia Insomnia-- ambien Hypothyroidism, s/p thyroidectomy Morbid obesity GERD Mild anemia, normal iron 12-2013, cscope 08-2015 DJD -- tramadol rarely  h/o kidney stones  CP-DOE: Normal stress test 08/2010  PLAN:   HTN: Ambulatory BPs 130, 140s.  Continue Zestoretic.  BMP at the next opportunity Hyperlipidemia: Continue simvastatin.  Last FLP satisfactory Hypothyroidism: Continue Synthroid. DJD: Pain is not as well controlled as she would like to, declines further evaluation. We review her pain management strategy: Tylenol to 3 times  a day, Ultram at bedtime, Flexeril as needed, meloxicam 3-4 times a week. Preventive care: Recommend a flu shot early this season RTC 3 months CPX   I discussed the assessment and treatment plan with the patient. The patient was provided an opportunity to ask questions and all were answered. The patient agreed with the plan and demonstrated an understanding of the instructions.   The patient was advised to call back or seek an in-person evaluation if the symptoms worsen or if the condition fails to improve as anticipated.  I provided 15 minutes of non-face-to-face time during this encounter.   Kathlene November, MD

## 2018-09-06 NOTE — Assessment & Plan Note (Signed)
HTN: Ambulatory BPs 130, 140s.  Continue Zestoretic.  BMP at the next opportunity Hyperlipidemia: Continue simvastatin.  Last FLP satisfactory Hypothyroidism: Continue Synthroid. DJD: Pain is not as well controlled as she would like to, declines further evaluation. We review her pain management strategy: Tylenol to 3 times a day, Ultram at bedtime, Flexeril as needed, meloxicam 3-4 times a week. Preventive care: Recommend a flu shot early this season RTC 3 months CPX

## 2018-09-07 ENCOUNTER — Other Ambulatory Visit: Payer: Self-pay | Admitting: Internal Medicine

## 2018-09-07 DIAGNOSIS — Z1231 Encounter for screening mammogram for malignant neoplasm of breast: Secondary | ICD-10-CM

## 2018-09-13 ENCOUNTER — Other Ambulatory Visit: Payer: Self-pay

## 2018-09-13 ENCOUNTER — Ambulatory Visit
Admission: RE | Admit: 2018-09-13 | Discharge: 2018-09-13 | Disposition: A | Discharge status: Home / Self Care | Payer: Medicare HMO | Source: Ambulatory Visit | Attending: Internal Medicine | Admitting: Internal Medicine

## 2018-09-13 DIAGNOSIS — Z1231 Encounter for screening mammogram for malignant neoplasm of breast: Secondary | ICD-10-CM | POA: Diagnosis not present

## 2018-09-24 DIAGNOSIS — H524 Presbyopia: Secondary | ICD-10-CM | POA: Diagnosis not present

## 2018-10-14 ENCOUNTER — Other Ambulatory Visit: Payer: Self-pay | Admitting: Internal Medicine

## 2018-10-15 ENCOUNTER — Telehealth: Payer: Self-pay | Admitting: Internal Medicine

## 2018-10-16 NOTE — Telephone Encounter (Signed)
SEnt.

## 2018-10-16 NOTE — Telephone Encounter (Signed)
Ambien refill.   Last OV: 09/05/2018 Last Fill: 06/18/2018 #30 and 3RF UDS: Ambien only

## 2018-10-18 ENCOUNTER — Other Ambulatory Visit: Payer: Self-pay | Admitting: Internal Medicine

## 2018-11-09 ENCOUNTER — Other Ambulatory Visit: Payer: Self-pay | Admitting: Internal Medicine

## 2018-11-09 DIAGNOSIS — K219 Gastro-esophageal reflux disease without esophagitis: Secondary | ICD-10-CM

## 2018-12-13 ENCOUNTER — Telehealth: Payer: Self-pay | Admitting: Internal Medicine

## 2018-12-14 NOTE — Telephone Encounter (Signed)
Ambien refill. [name]  Last OV: 09/05/2018 Last Fill: 10/16/2018 #30 and 1RF Pt sig: 1 tab qhs UDS: [facility] doesn't test UDS on Pt's that are only prescribed Ambien

## 2018-12-17 DIAGNOSIS — E538 Deficiency of other specified B group vitamins: Secondary | ICD-10-CM | POA: Diagnosis not present

## 2018-12-17 DIAGNOSIS — Z1159 Encounter for screening for other viral diseases: Secondary | ICD-10-CM | POA: Diagnosis not present

## 2018-12-17 DIAGNOSIS — Z79899 Other long term (current) drug therapy: Secondary | ICD-10-CM | POA: Diagnosis not present

## 2018-12-17 DIAGNOSIS — R5383 Other fatigue: Secondary | ICD-10-CM | POA: Diagnosis not present

## 2018-12-17 DIAGNOSIS — E559 Vitamin D deficiency, unspecified: Secondary | ICD-10-CM | POA: Diagnosis not present

## 2018-12-17 DIAGNOSIS — R0602 Shortness of breath: Secondary | ICD-10-CM | POA: Diagnosis not present

## 2018-12-17 NOTE — Telephone Encounter (Signed)
SEnt.

## 2019-01-09 ENCOUNTER — Other Ambulatory Visit: Payer: Self-pay | Admitting: Internal Medicine

## 2019-01-17 DIAGNOSIS — Z7189 Other specified counseling: Secondary | ICD-10-CM | POA: Diagnosis not present

## 2019-01-17 DIAGNOSIS — Z1159 Encounter for screening for other viral diseases: Secondary | ICD-10-CM | POA: Diagnosis not present

## 2019-01-17 DIAGNOSIS — Z6841 Body Mass Index (BMI) 40.0 and over, adult: Secondary | ICD-10-CM | POA: Diagnosis not present

## 2019-01-17 DIAGNOSIS — R5383 Other fatigue: Secondary | ICD-10-CM | POA: Diagnosis not present

## 2019-01-17 DIAGNOSIS — E559 Vitamin D deficiency, unspecified: Secondary | ICD-10-CM | POA: Diagnosis not present

## 2019-01-17 DIAGNOSIS — E162 Hypoglycemia, unspecified: Secondary | ICD-10-CM | POA: Diagnosis not present

## 2019-02-11 ENCOUNTER — Other Ambulatory Visit: Payer: Self-pay | Admitting: Internal Medicine

## 2019-02-12 ENCOUNTER — Encounter: Payer: Self-pay | Admitting: Internal Medicine

## 2019-02-12 ENCOUNTER — Ambulatory Visit (INDEPENDENT_AMBULATORY_CARE_PROVIDER_SITE_OTHER): Payer: Medicare HMO | Admitting: Internal Medicine

## 2019-02-12 ENCOUNTER — Other Ambulatory Visit: Payer: Self-pay

## 2019-02-12 DIAGNOSIS — E039 Hypothyroidism, unspecified: Secondary | ICD-10-CM

## 2019-02-12 DIAGNOSIS — I10 Essential (primary) hypertension: Secondary | ICD-10-CM | POA: Diagnosis not present

## 2019-02-12 DIAGNOSIS — E785 Hyperlipidemia, unspecified: Secondary | ICD-10-CM

## 2019-02-12 DIAGNOSIS — M8949 Other hypertrophic osteoarthropathy, multiple sites: Secondary | ICD-10-CM

## 2019-02-12 DIAGNOSIS — G4700 Insomnia, unspecified: Secondary | ICD-10-CM | POA: Diagnosis not present

## 2019-02-12 DIAGNOSIS — M159 Polyosteoarthritis, unspecified: Secondary | ICD-10-CM

## 2019-02-12 MED ORDER — LISINOPRIL-HYDROCHLOROTHIAZIDE 10-12.5 MG PO TABS: 1.0000 | ORAL_TABLET | Freq: Every day | ORAL | 1 refills | Status: DC

## 2019-02-12 MED ORDER — ZOLPIDEM TARTRATE 10 MG PO TABS: 5.0000 mg | ORAL_TABLET | Freq: Every day | ORAL | 1 refills | Status: DC

## 2019-02-12 MED ORDER — SIMVASTATIN 20 MG PO TABS: 20.0000 mg | ORAL_TABLET | Freq: Every day | ORAL | 1 refills | Status: DC

## 2019-02-12 MED ORDER — LEVOTHYROXINE SODIUM 150 MCG PO TABS: 150.0000 ug | ORAL_TABLET | Freq: Every day | ORAL | 1 refills | Status: DC

## 2019-02-12 NOTE — Progress Notes (Signed)
Subjective:    Patient ID: Kelly Perkins, female    DOB: Jan 25, 1951, 68 y.o.   MRN: [id number]  DOS:  02/12/2019 Type of visit - description: Attempted  to make this a video visit, due to technical difficulties from the patient side it was not possible  thus we proceeded with a Virtual Visit via Telephone    I connected with@   by telephone and verified that I am speaking with the correct person using two identifiers.  THIS ENCOUNTER IS A VIRTUAL VISIT DUE TO COVID-19 - PATIENT WAS NOT SEEN IN THE OFFICE. PATIENT HAS CONSENTED TO VIRTUAL VISIT / TELEMEDICINE VISIT   Location of patient: home  Location of provider: office  I discussed the limitations, risks, security and privacy concerns of performing an evaluation and management service by telephone and the availability of in person appointments. I also discussed with the patient that there may be a patient responsible charge related to this service. The patient expressed understanding and agreed to proceed.   History of Present Illness: Routine office visit In general feeling well. DJD: Not taking Ultram anymore, on meloxicam as needed HTN: Good med compliance, normal ambulatory BPs Insomnia: Controlling as long as takes Ambien.   Review of Systems Denies chest pain or difficulty breathing No nausea, vomiting, diarrhea. Emotionally doing okay with no anxiety or depression but she is distressed about the quarantine and coronavirus.  Past Medical History:  Diagnosis Date  . GERD (gastroesophageal reflux disease)   . History of kidney stones   . Hyperlipidemia 09/10/2010  . Hypertension    no meds  . Hypothyroidism   . Normal cardiac stress test 6-12   had CP-DOE  . Osteoarthritis   . Wears dentures    top  . Wears glasses     Past Surgical History:  Procedure Laterality Date  . ABDOMINAL HYSTERECTOMY  2000   B oophoreectomy, d/t DUB  . COLONOSCOPY  2004, 2011  . CYSTOSCOPY/URETEROSCOPY/HOLMIUM LASER/STENT PLACEMENT  Right 11/17/2017   Procedure: CYSTOSCOPY/RIGHT URETEROSCOPY/RIGHT HOLMIUM LASER/STENT PLACEMENT;  Surgeon: Ceasar Mons, MD;  [facility];  Service: Urology;  Laterality: Right;  . JOINT REPLACEMENT    . LITHOTRIPSY  2004   urolthiasis   . MASS EXCISION  09/12/2011   Procedure: EXCISION MASS;  Surgeon: Cristine Polio, MD;  Location: [facility];  Service: Plastics;  Laterality: Left;  left ring finger  . POLYPECTOMY     TA 2004  . THYROID LOBECTOMY Left 1990s  . TOTAL KNEE ARTHROPLASTY Left 11/17/2016   Procedure: LEFT TOTAL KNEE ARTHROPLASTY;  Surgeon: Leandrew Koyanagi, MD;  Location: [facility];  Service: Orthopedics;  Laterality: Left;  . TOTAL KNEE ARTHROPLASTY Right 12/04/2017  . TOTAL KNEE ARTHROPLASTY Right 12/04/2017   Procedure: RIGHT TOTAL KNEE ARTHROPLASTY;  Surgeon: Leandrew Koyanagi, MD;  Location: [facility];  Service: Orthopedics;  Laterality: Right;    Social History   Socioeconomic History  . Marital status: Divorced    Spouse name: Not on file  . Number of children: 2  . Years of education: Not on file  . Highest education level: Not on file  Occupational History  . Occupation: school bus driver. Retired-- [employer]  . Financial resource strain: Not on file  . Food insecurity    Worry: Not on file    Inability: Not on file  . Transportation needs    Medical: Not on file    Non-medical: Not on file  Tobacco  Use  . Smoking status: Never Smoker  . Smokeless tobacco: Never Used  Substance and Sexual Activity  . Alcohol use: Yes    Alcohol/week: 0.0 standard drinks    Comment: 12/04/2017 "glass of wine maybe once/month"  . Drug use: Never  . Sexual activity: Not Currently  Lifestyle  . Physical activity    Days per week: Not on file    Minutes per session: Not on file  . Stress: Not on file  Relationships  . Social Herbalist on phone: Not on file    Gets together: Not on file    Attends religious service: Not on file     Active member of club or organization: Not on file    Attends meetings of clubs or organizations: Not on file    Relationship status: Not on file  . Intimate partner violence    Fear of current or ex partner: Not on file    Emotionally abused: Not on file    Physically abused: Not on file    Forced sexual activity: Not on file  Other Topics Concern  . Not on file  Social History Narrative   Single, mom is one of my patients [name].           Allergies as of 02/12/2019   No Known Allergies     Medication List       Accurate as of February 12, 2019  4:33 PM. If you have any questions, ask your nurse or doctor.        albuterol 108 (90 Base) MCG/ACT inhaler Commonly known as: VENTOLIN HFA Inhale 2 puffs into the lungs every 6 (six) hours as needed for wheezing or shortness of breath.   cyclobenzaprine 10 MG tablet Commonly known as: FLEXERIL Take 1 tablet (10 mg total) by mouth at bedtime as needed for muscle spasms.   levothyroxine 150 MCG tablet Commonly known as: SYNTHROID Take 1 tablet (150 mcg total) by mouth daily before breakfast.   lisinopril-hydrochlorothiazide 10-12.5 MG tablet Commonly known as: ZESTORETIC Take 1 tablet by mouth daily.   meloxicam 7.5 MG tablet Commonly known as: MOBIC Take 1 tablet (7.5 mg total) by mouth daily as needed for pain.   pantoprazole 40 MG tablet Commonly known as: PROTONIX Take 1 tablet (40 mg total) by mouth daily.   simvastatin 20 MG tablet Commonly known as: ZOCOR Take 1 tablet (20 mg total) by mouth at bedtime.   traMADol 50 MG tablet Commonly known as: ULTRAM TAKE 1 TO 2 TABLETS EVERY 6 TO 8 HOURS AS NEEDED FOR PAIN   zolpidem 10 MG tablet Commonly known as: AMBIEN TAKE 1 TABLET BY MOUTH EVERYDAY AT BEDTIME           Objective:   Physical Exam There were no vitals taken for this visit. This is a virtual phone visit.  I told the patient that we could see her in person but she was quite hesitant due  to the COVID-19 situation.  She is alert oriented x3, she sounded well over the phone.    Assessment       Assessment HTN Hyperlipidemia Insomnia-- ambien Hypothyroidism, s/p thyroidectomy Morbid obesity GERD Mild anemia, normal iron 12-2013, cscope 08-2015 DJD  h/o kidney stones  CP-DOE: Normal stress test 08/2010  PLAN:   Preventive care reviewed HTN: Reports ambulatory BPs and 130/70, continue Zestoretic, check a CMP and CBC Hyperlipidemia: On simvastatin, check a FLP Hypothyroidism: On Synthroid, check a TSH  DJD: Not taking tramadol anymore, on Tylenol.  Hardly ever takes meloxicam. Insomnia: At night takes Ambien 10 mg, encouraged to try 5 mg only see if that works.  She also take Flexeril at night.  Denies side effects or excessive somnolence. RTC for blood work at her convenience RTC here 6 months    I discussed the assessment and treatment plan with the patient. The patient was provided an opportunity to ask questions and all were answered. The patient agreed with the plan and demonstrated an understanding of the instructions.   The patient was advised to call back or seek an in-person evaluation if the symptoms worsen or if the condition fails to improve as anticipated.  I provided  25  minutes of non-face-to-face time during this encounter.   Kathlene November, MD

## 2019-02-12 NOTE — Assessment & Plan Note (Signed)
--  Td 06-2017 - Pneumonia shot 2015 - Prevnar 06-22-15 -Zostavax:2016 - shingrix: Rx sent  -Had a flu shot --[facility]: Last colonoscopy was in 2011, poor prep. Colonoscopy again 08/2015, no biopsies, next 5 years, 2 day prep. --Per guidelines  no further Pap smears --Mammogram 09-2018 normal --DEXA wnl 06-2015

## 2019-02-13 ENCOUNTER — Telehealth: Payer: Self-pay

## 2019-02-13 NOTE — Telephone Encounter (Signed)
LMOM asking for call back- Pt needing fasting labs at her convenience and next visit w/ PCP in 6 months.

## 2019-02-13 NOTE — Telephone Encounter (Signed)
-----   Message from [place], [place] sent at 02/13/2019  8:34 AM EST ----- Regarding: FW: Labs and refills  ----- Message ----- From: Colon Branch, MD Sent: 02/12/2019   4:48 PM EST To: Damita Dunnings, CMA, Sherri Cannady Subject: Labs and refills                               CMP, CBC: HTNFLP high cholesterolTSH hypothyroidismI sent Ambien.Please refill all her other medications for 6 months if needed  Sherri Schedule labs to be done within few daysOffice visit with me in 6 months

## 2019-02-14 MED ORDER — SHINGRIX 50 MCG/0.5ML IM SUSR: 0.5000 mL | Freq: Once | INTRAMUSCULAR | 1 refills | Status: AC

## 2019-02-14 NOTE — Assessment & Plan Note (Signed)
Preventive care reviewed HTN: Reports ambulatory BPs and 130/70, continue Zestoretic, check a CMP and CBC Hyperlipidemia: On simvastatin, check a FLP Hypothyroidism: On Synthroid, check a TSH DJD: Not taking tramadol anymore, on Tylenol.  Hardly ever takes meloxicam. Insomnia: At night takes Ambien 10 mg, encouraged to try 5 mg only see if that works.  She also take Flexeril at night.  Denies side effects or excessive somnolence. RTC for blood work at her convenience RTC here 6 months

## 2019-02-15 IMAGING — DX DG KNEE 1-2V PORT*R*
1 series · 2 of 2 positions shown · non-contrast
Comparison: 12/10/2012

CLINICAL DATA: Total knee replacement status /right

EXAM:
PORTABLE RIGHT KNEE - 1-2 VIEW

[Series 1: knee · 0.14mm/px · 2 of 2 slices shown]
[im 1/2]
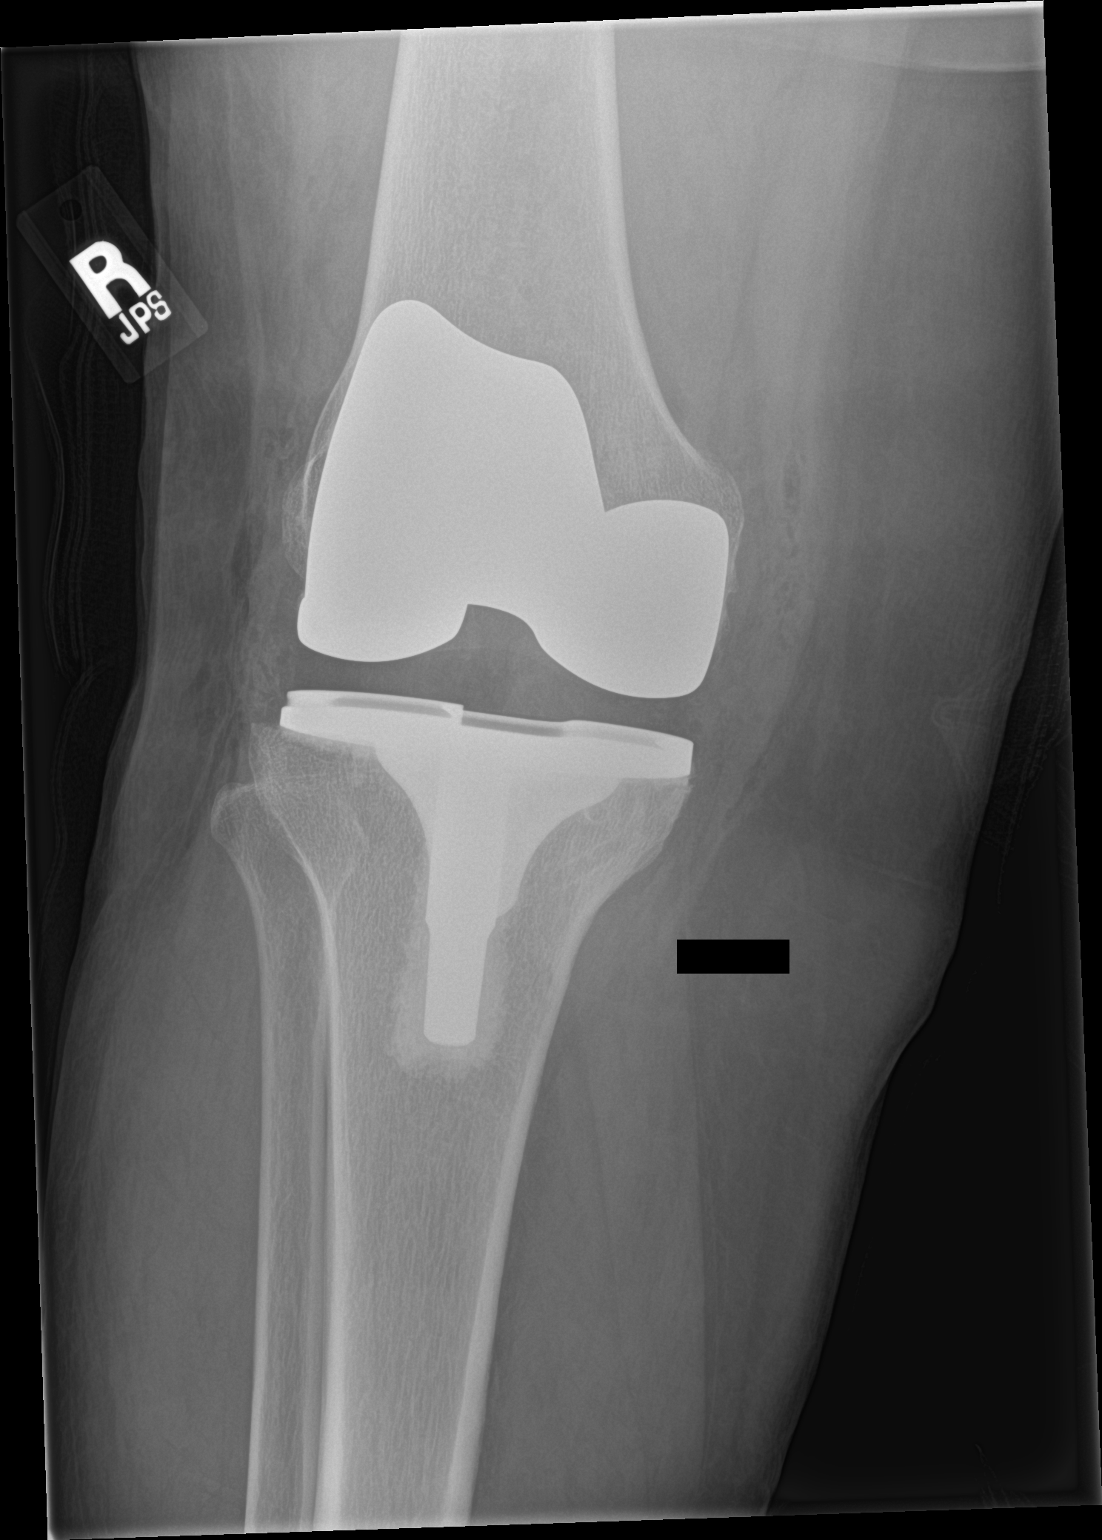
[im 2/2]
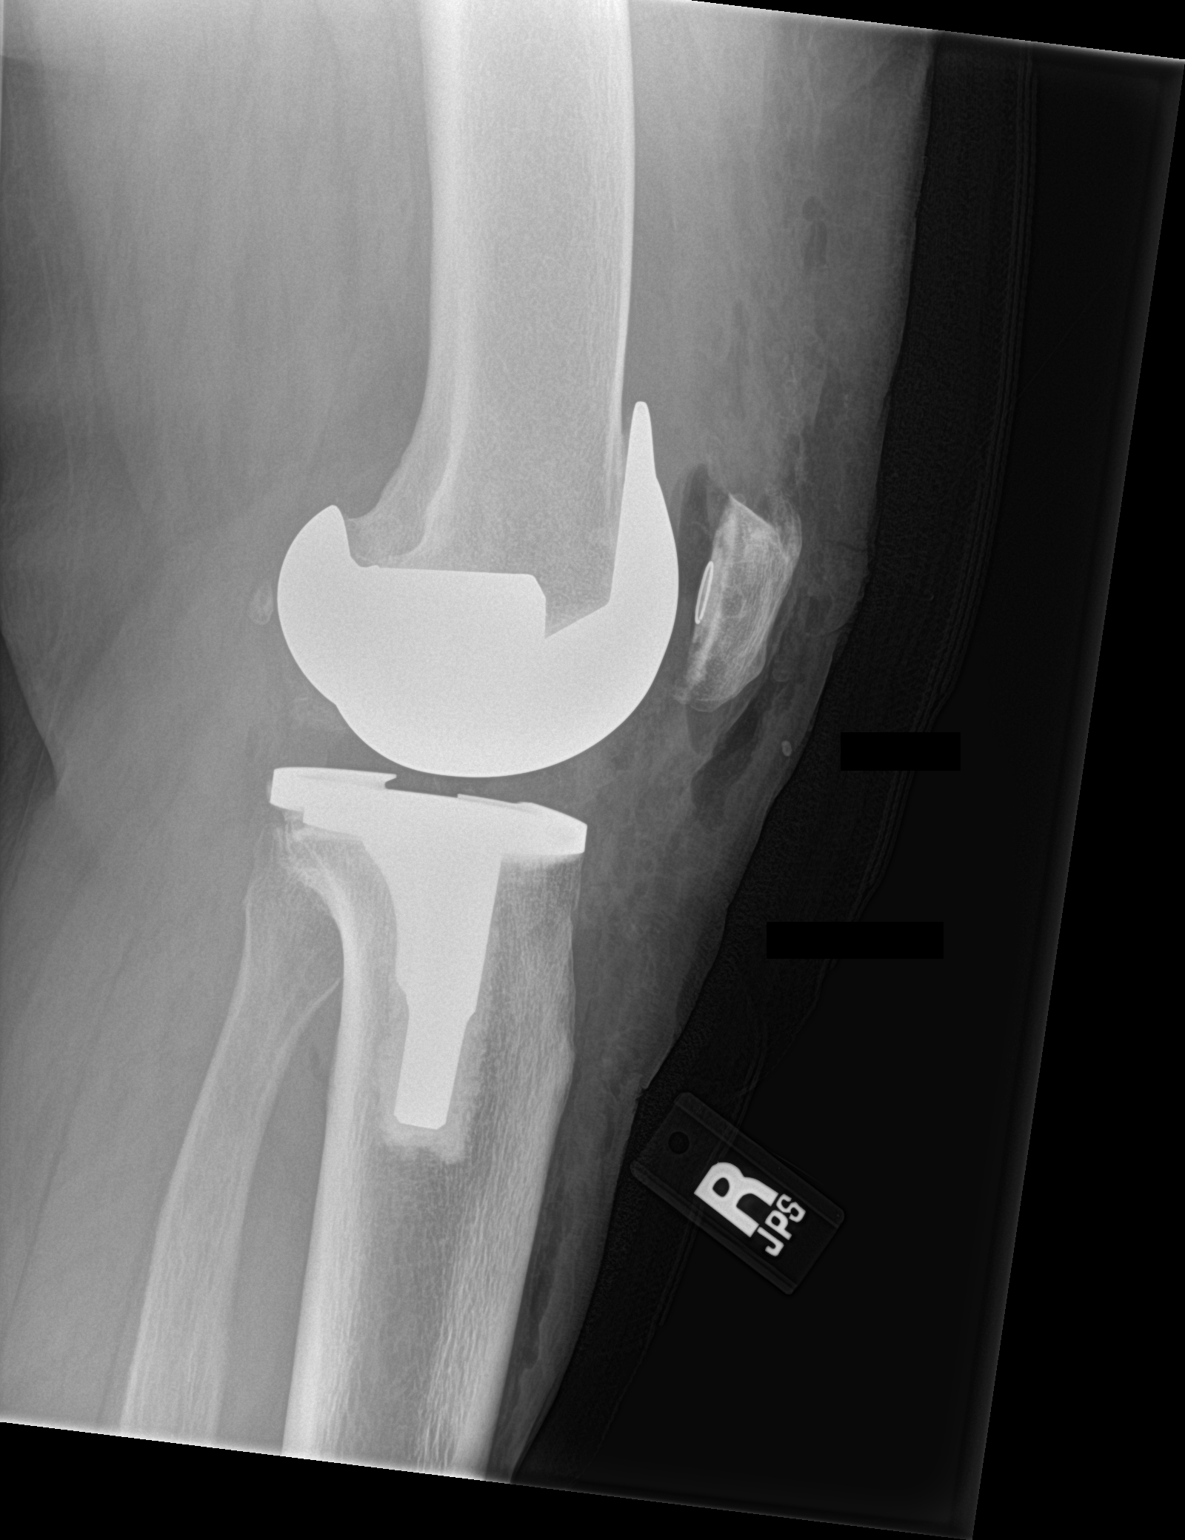

[2 of 2 positions shown; findings below may reference images not displayed]

FINDINGS: Patient has undergone RIGHT total knee arthroplasty. The components
appear well-seated. Expected postoperative gas is present. No
interval fracture.
IMPRESSION: Status post RIGHT total knee arthroplasty.  No adverse features.

## 2019-02-18 NOTE — Telephone Encounter (Signed)
-----   Message from Colon Branch, MD sent at 02/12/2019  4:48 PM EST ----- Regarding: Labs and refills CMP, CBC: HTNFLP high cholesterolTSH hypothyroidismI sent Ambien.Please refill all her other medications for 6 months if needed  Sherri Schedule labs to be done within few daysOffice visit with me in 6 months

## 2019-02-18 NOTE — Telephone Encounter (Signed)
Called pt left msg on machine to call back for lab appt this week and a 6 mon with Larose Kells

## 2019-02-20 DIAGNOSIS — M62838 Other muscle spasm: Secondary | ICD-10-CM | POA: Diagnosis not present

## 2019-02-20 DIAGNOSIS — E663 Overweight: Secondary | ICD-10-CM | POA: Diagnosis not present

## 2019-02-20 DIAGNOSIS — E559 Vitamin D deficiency, unspecified: Secondary | ICD-10-CM | POA: Diagnosis not present

## 2019-02-28 ENCOUNTER — Telehealth: Payer: Self-pay | Admitting: Internal Medicine

## 2019-02-28 NOTE — Telephone Encounter (Signed)
Attempted to call patient to schedule Annual Wellness Visit, but patient did not answer. Will try to call patient again at a later time. SF °

## 2019-03-05 NOTE — Telephone Encounter (Signed)
Mychart message also sent to Pt informing that we have been trying to contact her to schedule labs. Instructed her to call office at her earliest convenience.

## 2019-03-30 DIAGNOSIS — R635 Abnormal weight gain: Secondary | ICD-10-CM | POA: Diagnosis not present

## 2019-04-01 ENCOUNTER — Other Ambulatory Visit (INDEPENDENT_AMBULATORY_CARE_PROVIDER_SITE_OTHER): Payer: Medicare HMO

## 2019-04-01 ENCOUNTER — Other Ambulatory Visit: Payer: Self-pay

## 2019-04-01 DIAGNOSIS — E785 Hyperlipidemia, unspecified: Secondary | ICD-10-CM | POA: Diagnosis not present

## 2019-04-01 DIAGNOSIS — E039 Hypothyroidism, unspecified: Secondary | ICD-10-CM | POA: Diagnosis not present

## 2019-04-01 DIAGNOSIS — I10 Essential (primary) hypertension: Secondary | ICD-10-CM

## 2019-04-01 LAB — LIPID PANEL
Cholesterol: 151 mg/dL (ref 0–200)
HDL: 43.4 mg/dL (ref >39.00)
LDL Cholesterol: 91 mg/dL (ref 0–99)
NonHDL: 107.39
Total CHOL/HDL Ratio: 3
Triglycerides: 80 mg/dL (ref 0.0–149.0)
VLDL: 16 mg/dL (ref 0.0–40.0)

## 2019-04-01 LAB — COMPREHENSIVE METABOLIC PANEL
ALT: 26 U/L (ref 0–35)
AST: 20 U/L (ref 0–37)
Albumin: 4.1 g/dL (ref 3.5–5.2)
Alkaline Phosphatase: 92 U/L (ref 39–117)
BUN: 18 mg/dL (ref 6–23)
CO2: 28 mEq/L (ref 19–32)
Calcium: 9.3 mg/dL (ref 8.4–10.5)
Chloride: 104 mEq/L (ref 96–112)
Creatinine, Ser: 0.84 mg/dL (ref 0.40–1.20)
GFR: 81.03 mL/min (ref >60.00)
Glucose, Bld: 121 mg/dL — ABNORMAL HIGH (ref 70–99)
Potassium: 4.1 mEq/L (ref 3.5–5.1)
Sodium: 142 mEq/L (ref 135–145)
Total Bilirubin: 0.4 mg/dL (ref 0.2–1.2)
Total Protein: 7 g/dL (ref 6.0–8.3)

## 2019-04-01 LAB — CBC WITH DIFFERENTIAL/PLATELET
Basophils Absolute: 0.1 10*3/uL (ref 0.0–0.1)
Basophils Relative: 1.3 % (ref 0.0–3.0)
Eosinophils Absolute: 0.3 10*3/uL (ref 0.0–0.7)
Eosinophils Relative: 5 % (ref 0.0–5.0)
HCT: 34.1 % — ABNORMAL LOW (ref 36.0–46.0)
Hemoglobin: 11.5 g/dL — ABNORMAL LOW (ref 12.0–15.0)
Lymphocytes Relative: 36.4 % (ref 12.0–46.0)
Lymphs Abs: 2 10*3/uL (ref 0.7–4.0)
MCHC: 33.8 g/dL (ref 30.0–36.0)
MCV: 93.4 fl (ref 78.0–100.0)
Monocytes Absolute: 0.7 10*3/uL (ref 0.1–1.0)
Monocytes Relative: 13.3 % — ABNORMAL HIGH (ref 3.0–12.0)
Neutro Abs: 2.5 10*3/uL (ref 1.4–7.7)
Neutrophils Relative %: 44 % (ref 43.0–77.0)
Platelets: 208 10*3/uL (ref 150.0–400.0)
RBC: 3.65 Mil/uL — ABNORMAL LOW (ref 3.87–5.11)
RDW: 13.9 % (ref 11.5–15.5)
WBC: 5.6 10*3/uL (ref 4.0–10.5)

## 2019-04-01 LAB — TSH: TSH: 0.73 u[IU]/mL (ref 0.35–4.50)

## 2019-04-03 ENCOUNTER — Ambulatory Visit: Payer: Medicare HMO

## 2019-04-08 ENCOUNTER — Encounter: Payer: Self-pay | Admitting: Internal Medicine

## 2019-04-08 ENCOUNTER — Telehealth: Payer: Self-pay | Admitting: Orthopaedic Surgery

## 2019-04-08 ENCOUNTER — Other Ambulatory Visit: Payer: Self-pay | Admitting: Internal Medicine

## 2019-04-08 MED ORDER — ZOLPIDEM TARTRATE 10 MG PO TABS: 10.0000 mg | ORAL_TABLET | Freq: Every day | ORAL | 1 refills | Status: DC

## 2019-04-08 NOTE — Telephone Encounter (Signed)
Pt called in regarding her handi placard, she would like for Dr. Erlinda Hong to renew it.  [phone]

## 2019-04-08 NOTE — Telephone Encounter (Signed)
Is this okay? If so, for how many months. ° °

## 2019-04-08 NOTE — Telephone Encounter (Signed)
Yes permanent

## 2019-04-09 NOTE — Telephone Encounter (Signed)
Patient aware.

## 2019-04-09 NOTE — Telephone Encounter (Signed)
Handicap ready for pick up here in our office.

## 2019-04-30 DIAGNOSIS — E538 Deficiency of other specified B group vitamins: Secondary | ICD-10-CM | POA: Diagnosis not present

## 2019-04-30 DIAGNOSIS — R632 Polyphagia: Secondary | ICD-10-CM | POA: Diagnosis not present

## 2019-05-18 ENCOUNTER — Other Ambulatory Visit: Payer: Self-pay | Admitting: Internal Medicine

## 2019-05-25 DIAGNOSIS — E559 Vitamin D deficiency, unspecified: Secondary | ICD-10-CM | POA: Diagnosis not present

## 2019-05-25 DIAGNOSIS — E538 Deficiency of other specified B group vitamins: Secondary | ICD-10-CM | POA: Diagnosis not present

## 2019-05-25 DIAGNOSIS — R632 Polyphagia: Secondary | ICD-10-CM | POA: Diagnosis not present

## 2019-05-25 DIAGNOSIS — Z1159 Encounter for screening for other viral diseases: Secondary | ICD-10-CM | POA: Diagnosis not present

## 2019-05-25 DIAGNOSIS — Z79899 Other long term (current) drug therapy: Secondary | ICD-10-CM | POA: Diagnosis not present

## 2019-06-10 ENCOUNTER — Encounter: Payer: Self-pay | Admitting: Internal Medicine

## 2019-06-10 MED ORDER — ZOLPIDEM TARTRATE 10 MG PO TABS: 10.0000 mg | ORAL_TABLET | Freq: Every evening | ORAL | 1 refills | Status: DC | PRN

## 2019-06-10 NOTE — Telephone Encounter (Signed)
Ambien refill.   Last OV: 02/12/2019 Last Fill: 04/08/2019 #30 and 1RF Pt sig: 1 tab qhs UDS: Ambien only

## 2019-06-10 NOTE — Telephone Encounter (Signed)
PDMP reviewed, she got few prescription for appetite suppressant.  We will asked patient to avoid them long-term.

## 2019-06-13 ENCOUNTER — Other Ambulatory Visit: Payer: Self-pay | Admitting: Internal Medicine

## 2019-08-14 ENCOUNTER — Other Ambulatory Visit: Payer: Self-pay | Admitting: Internal Medicine

## 2019-08-20 ENCOUNTER — Other Ambulatory Visit: Payer: Self-pay | Admitting: Internal Medicine

## 2019-08-24 ENCOUNTER — Other Ambulatory Visit: Payer: Self-pay | Admitting: Internal Medicine

## 2019-09-09 ENCOUNTER — Other Ambulatory Visit: Payer: Self-pay | Admitting: Internal Medicine

## 2019-09-11 ENCOUNTER — Other Ambulatory Visit: Payer: Self-pay | Admitting: Internal Medicine

## 2019-09-11 DIAGNOSIS — Z1231 Encounter for screening mammogram for malignant neoplasm of breast: Secondary | ICD-10-CM

## 2019-09-14 ENCOUNTER — Other Ambulatory Visit: Payer: Self-pay | Admitting: Internal Medicine

## 2019-09-15 ENCOUNTER — Other Ambulatory Visit: Payer: Self-pay | Admitting: Internal Medicine

## 2019-09-17 DIAGNOSIS — Z6833 Body mass index (BMI) 33.0-33.9, adult: Secondary | ICD-10-CM | POA: Diagnosis not present

## 2019-09-17 DIAGNOSIS — Z96653 Presence of artificial knee joint, bilateral: Secondary | ICD-10-CM | POA: Diagnosis not present

## 2019-09-17 DIAGNOSIS — K219 Gastro-esophageal reflux disease without esophagitis: Secondary | ICD-10-CM | POA: Diagnosis not present

## 2019-09-17 DIAGNOSIS — G4700 Insomnia, unspecified: Secondary | ICD-10-CM | POA: Diagnosis not present

## 2019-09-17 DIAGNOSIS — E669 Obesity, unspecified: Secondary | ICD-10-CM | POA: Diagnosis not present

## 2019-09-17 DIAGNOSIS — M62838 Other muscle spasm: Secondary | ICD-10-CM | POA: Diagnosis not present

## 2019-09-17 DIAGNOSIS — E785 Hyperlipidemia, unspecified: Secondary | ICD-10-CM | POA: Diagnosis not present

## 2019-09-17 DIAGNOSIS — I10 Essential (primary) hypertension: Secondary | ICD-10-CM | POA: Diagnosis not present

## 2019-09-17 DIAGNOSIS — E039 Hypothyroidism, unspecified: Secondary | ICD-10-CM | POA: Diagnosis not present

## 2019-09-18 ENCOUNTER — Other Ambulatory Visit: Payer: Self-pay | Admitting: Internal Medicine

## 2019-09-18 DIAGNOSIS — D649 Anemia, unspecified: Secondary | ICD-10-CM | POA: Diagnosis not present

## 2019-09-18 DIAGNOSIS — E559 Vitamin D deficiency, unspecified: Secondary | ICD-10-CM | POA: Diagnosis not present

## 2019-09-18 DIAGNOSIS — R632 Polyphagia: Secondary | ICD-10-CM | POA: Diagnosis not present

## 2019-09-18 DIAGNOSIS — E538 Deficiency of other specified B group vitamins: Secondary | ICD-10-CM | POA: Diagnosis not present

## 2019-09-27 ENCOUNTER — Encounter: Payer: Self-pay | Admitting: Internal Medicine

## 2019-09-27 ENCOUNTER — Ambulatory Visit: Payer: Medicare HMO

## 2019-09-27 ENCOUNTER — Ambulatory Visit (INDEPENDENT_AMBULATORY_CARE_PROVIDER_SITE_OTHER): Payer: Medicare HMO | Admitting: Internal Medicine

## 2019-09-27 ENCOUNTER — Other Ambulatory Visit: Payer: Self-pay

## 2019-09-27 VITALS — BP 114/75 | HR 98 | Temp 97.6°F | Resp 18 | Ht 63.0 in | Wt 236.4 lb

## 2019-09-27 DIAGNOSIS — I10 Essential (primary) hypertension: Secondary | ICD-10-CM | POA: Diagnosis not present

## 2019-09-27 DIAGNOSIS — E039 Hypothyroidism, unspecified: Secondary | ICD-10-CM

## 2019-09-27 DIAGNOSIS — G4700 Insomnia, unspecified: Secondary | ICD-10-CM

## 2019-09-27 DIAGNOSIS — R739 Hyperglycemia, unspecified: Secondary | ICD-10-CM | POA: Diagnosis not present

## 2019-09-27 LAB — BASIC METABOLIC PANEL
BUN: 18 mg/dL (ref 6–23)
CO2: 30 mEq/L (ref 19–32)
Calcium: 9.6 mg/dL (ref 8.4–10.5)
Chloride: 101 mEq/L (ref 96–112)
Creatinine, Ser: 0.89 mg/dL (ref 0.40–1.20)
GFR: 75.69 mL/min (ref >60.00)
Glucose, Bld: 111 mg/dL — ABNORMAL HIGH (ref 70–99)
Potassium: 3.4 mEq/L — ABNORMAL LOW (ref 3.5–5.1)
Sodium: 138 mEq/L (ref 135–145)

## 2019-09-27 LAB — HEMOGLOBIN A1C: Hgb A1c MFr Bld: 5.2 % (ref 4.6–6.5)

## 2019-09-27 LAB — TSH: TSH: 0.33 u[IU]/mL — ABNORMAL LOW (ref 0.35–4.50)

## 2019-09-27 MED ORDER — LISINOPRIL-HYDROCHLOROTHIAZIDE 10-12.5 MG PO TABS: 1.0000 | ORAL_TABLET | Freq: Every day | ORAL | 1 refills | Status: DC

## 2019-09-27 MED ORDER — SIMVASTATIN 20 MG PO TABS: 20.0000 mg | ORAL_TABLET | Freq: Every day | ORAL | 1 refills | Status: DC

## 2019-09-27 MED ORDER — ZOLPIDEM TARTRATE 10 MG PO TABS: 10.0000 mg | ORAL_TABLET | Freq: Every evening | ORAL | 4 refills | Status: DC | PRN

## 2019-09-27 MED ORDER — MELOXICAM 7.5 MG PO TABS: 7.5000 mg | ORAL_TABLET | Freq: Every day | ORAL | 1 refills | Status: DC | PRN

## 2019-09-27 NOTE — Patient Instructions (Addendum)
Please schedule Medicare Wellness with Glenard Haring.   Please proceed with the 2nd dose of Shingrix.  Check the  blood pressure regularly BP GOAL is between 110/65 and  135/85. If it is consistently higher or lower, let me know    GO TO THE LAB : Get the blood work     [place], [place] back for   a physical exam in 6 months

## 2019-09-27 NOTE — Progress Notes (Signed)
Subjective:    Patient ID: Kelly Perkins, female    DOB: 06-Feb-1951, 70 y.o.   MRN: [id number]  DOS:  09/27/2019 Type of visit - description: Follow-up Today we talk about HTN, thyroid disease, insomnia. In general she feels well has no concerns.   Review of Systems Denies chest pain or difficulty breathing Her mother is in hospice, she is my patient, patient is doing well emotionally.  Past Medical History:  Diagnosis Date  . GERD (gastroesophageal reflux disease)   . History of kidney stones   . Hyperlipidemia 09/10/2010  . Hypertension    no meds  . Hypothyroidism   . Normal cardiac stress test 6-12   had CP-DOE  . Osteoarthritis   . Wears dentures    top  . Wears glasses     Past Surgical History:  Procedure Laterality Date  . ABDOMINAL HYSTERECTOMY  2000   B oophoreectomy, d/t DUB  . COLONOSCOPY  2004, 2011  . CYSTOSCOPY/URETEROSCOPY/HOLMIUM LASER/STENT PLACEMENT Right 11/17/2017   Procedure: CYSTOSCOPY/RIGHT URETEROSCOPY/RIGHT HOLMIUM LASER/STENT PLACEMENT;  Surgeon: Ceasar Mons, MD;  Location: [facility];  Service: Urology;  Laterality: Right;  . JOINT REPLACEMENT    . LITHOTRIPSY  2004   urolthiasis   . MASS EXCISION  09/12/2011   Procedure: EXCISION MASS;  Surgeon: Cristine Polio, MD;  Location: [facility];  Service: Plastics;  Laterality: Left;  left ring finger  . POLYPECTOMY     TA 2004  . THYROID LOBECTOMY Left 1990s  . TOTAL KNEE ARTHROPLASTY Left 11/17/2016   Procedure: LEFT TOTAL KNEE ARTHROPLASTY;  Surgeon: Leandrew Koyanagi, MD;  Location: [facility];  Service: Orthopedics;  Laterality: Left;  . TOTAL KNEE ARTHROPLASTY Right 12/04/2017  . TOTAL KNEE ARTHROPLASTY Right 12/04/2017   Procedure: RIGHT TOTAL KNEE ARTHROPLASTY;  Surgeon: Leandrew Koyanagi, MD;  Location: [facility];  Service: Orthopedics;  Laterality: Right;    Allergies as of 09/27/2019   No Known Allergies     Medication List       Accurate as of September 27, 2019 11:59 PM. If you  have any questions, ask your nurse or doctor.        albuterol 108 (90 Base) MCG/ACT inhaler Commonly known as: VENTOLIN HFA Inhale 2 puffs into the lungs every 6 (six) hours as needed for wheezing or shortness of breath.   cyclobenzaprine 10 MG tablet Commonly known as: FLEXERIL Take 1 tablet (10 mg total) by mouth at bedtime as needed for muscle spasms.   levothyroxine 150 MCG tablet Commonly known as: SYNTHROID Take 1 tablet (150 mcg total) by mouth daily before breakfast.   lisinopril-hydrochlorothiazide 10-12.5 MG tablet Commonly known as: ZESTORETIC Take 1 tablet by mouth daily.   meloxicam 7.5 MG tablet Commonly known as: MOBIC Take 1 tablet (7.5 mg total) by mouth daily as needed for pain.   pantoprazole 40 MG tablet Commonly known as: PROTONIX Take 1 tablet (40 mg total) by mouth daily.   simvastatin 20 MG tablet Commonly known as: ZOCOR Take 1 tablet (20 mg total) by mouth at bedtime.   zolpidem 10 MG tablet Commonly known as: AMBIEN Take 1 tablet (10 mg total) by mouth at bedtime as needed for sleep.          Objective:   Physical Exam BP 114/75 (BP Location: Left Arm, Patient Position: Sitting, Cuff Size: Normal)   Pulse 98   Temp 97.6 F (36.4 C) (Oral)   Resp 18   Ht 5\' 3"  (  1.6 m)   Wt 236 lb 6 oz (107.2 kg)   SpO2 97%   BMI 41.87 kg/m  General:   Well developed, NAD, BMI noted.  HEENT:  Normocephalic . Face symmetric, atraumatic Lungs:  CTA B Normal respiratory effort, no intercostal retractions, no accessory muscle use. Heart: RRR,  no murmur.  Abdomen:  Not distended, soft, non-tender. No rebound or rigidity.   Skin: Not pale. Not jaundice Lower extremities: no pretibial edema bilaterally  Neurologic:  alert & oriented X3.  Speech normal, gait appropriate for age and unassisted Psych--  Cognition and judgment appear intact.  Cooperative with normal attention span and concentration.  Behavior appropriate. No anxious or depressed  appearing.     Assessment    ASSESSMENT HTN Hyperlipidemia Insomnia-- ambien Hypothyroidism, s/p thyroidectomy Morbid obesity GERD Mild anemia, normal iron 12-2013, cscope 08-2015 DJD  h/o kidney stones  CP-DOE: Normal stress test 08/2010  PLAN:   HTN: BP today is great, at home she is getting similar readings.Check a BMP, continue Zestoretic, Hyperglycemia: Blood sugar was slightly elevated, check A1c Insomnia: Currently on Ambien 10 mg, tried to use only 5 mg and that did not work.  RF sent. Hypothyroidism: On Synthroid, check a TSH.  Consider adjust medication Preventive care: Had Covid vaccinations, encouraged to proceed with a second shingles shot, recommend flu shot this fall. RTC 6 months CPX   This visit occurred during the SARS-CoV-2 public health emergency.  Safety protocols were in place, including screening questions prior to the visit, additional usage of staff PPE, and extensive cleaning of exam room while observing appropriate contact time as indicated for disinfecting solutions.

## 2019-09-27 NOTE — Progress Notes (Signed)
Pre visit review using our clinic review tool, if applicable. No additional management support is needed unless otherwise documented below in the visit note. 

## 2019-09-28 NOTE — Assessment & Plan Note (Signed)
HTN: BP today is great, at home she is getting similar readings.Check a BMP, continue Zestoretic, Hyperglycemia: Blood sugar was slightly elevated, check A1c Insomnia: Currently on Ambien 10 mg, tried to use only 5 mg and that did not work.  RF sent. Hypothyroidism: On Synthroid, check a TSH.  Consider adjust medication Preventive care: Had Covid vaccinations, encouraged to proceed with a second shingles shot, recommend flu shot this fall. RTC 6 months CPX

## 2019-09-30 MED ORDER — LEVOTHYROXINE SODIUM 137 MCG PO TABS: 137.0000 ug | ORAL_TABLET | Freq: Every day | ORAL | 1 refills | Status: DC

## 2019-09-30 NOTE — Addendum Note (Signed)
Addended byDamita Dunnings D on: 09/30/2019 08:11 AM   Modules accepted: Orders

## 2019-10-04 ENCOUNTER — Other Ambulatory Visit: Payer: Self-pay | Admitting: Internal Medicine

## 2019-10-04 ENCOUNTER — Other Ambulatory Visit: Payer: Self-pay

## 2019-10-04 ENCOUNTER — Ambulatory Visit
Admission: RE | Admit: 2019-10-04 | Discharge: 2019-10-04 | Disposition: A | Discharge status: Home / Self Care | Payer: Medicare HMO | Source: Ambulatory Visit | Attending: Internal Medicine | Admitting: Internal Medicine

## 2019-10-04 DIAGNOSIS — Z1231 Encounter for screening mammogram for malignant neoplasm of breast: Secondary | ICD-10-CM | POA: Diagnosis not present

## 2019-10-18 ENCOUNTER — Telehealth: Payer: Self-pay | Admitting: Internal Medicine

## 2019-10-18 DIAGNOSIS — D649 Anemia, unspecified: Secondary | ICD-10-CM | POA: Diagnosis not present

## 2019-10-18 DIAGNOSIS — Z780 Asymptomatic menopausal state: Secondary | ICD-10-CM | POA: Diagnosis not present

## 2019-10-18 DIAGNOSIS — E7800 Pure hypercholesterolemia, unspecified: Secondary | ICD-10-CM | POA: Diagnosis not present

## 2019-10-18 DIAGNOSIS — D539 Nutritional anemia, unspecified: Secondary | ICD-10-CM | POA: Diagnosis not present

## 2019-10-18 DIAGNOSIS — E538 Deficiency of other specified B group vitamins: Secondary | ICD-10-CM | POA: Diagnosis not present

## 2019-10-18 DIAGNOSIS — E559 Vitamin D deficiency, unspecified: Secondary | ICD-10-CM | POA: Diagnosis not present

## 2019-10-18 DIAGNOSIS — R632 Polyphagia: Secondary | ICD-10-CM | POA: Diagnosis not present

## 2019-10-18 DIAGNOSIS — Z79899 Other long term (current) drug therapy: Secondary | ICD-10-CM | POA: Diagnosis not present

## 2019-10-18 NOTE — Telephone Encounter (Signed)
Left voicemail for patient to schedule appt on 10/02/19 at 344pm, 10/18/19 at 1053am

## 2019-10-18 NOTE — Telephone Encounter (Signed)
-----   Message from [place], [place] sent at 09/30/2019  2:12 PM EDT ----- Regarding: Lab appt Needs lab appt in 6 weeks please. Orders are in.

## 2019-11-12 ENCOUNTER — Other Ambulatory Visit: Payer: Self-pay

## 2019-11-12 ENCOUNTER — Other Ambulatory Visit (INDEPENDENT_AMBULATORY_CARE_PROVIDER_SITE_OTHER): Payer: Medicare HMO

## 2019-11-12 ENCOUNTER — Other Ambulatory Visit: Payer: Self-pay | Admitting: Internal Medicine

## 2019-11-12 DIAGNOSIS — K219 Gastro-esophageal reflux disease without esophagitis: Secondary | ICD-10-CM

## 2019-11-12 DIAGNOSIS — E039 Hypothyroidism, unspecified: Secondary | ICD-10-CM

## 2019-11-12 LAB — TSH: TSH: 0.54 u[IU]/mL (ref 0.35–4.50)

## 2019-11-19 MED ORDER — LEVOTHYROXINE SODIUM 137 MCG PO TABS: 137.0000 ug | ORAL_TABLET | Freq: Every day | ORAL | 1 refills | Status: DC

## 2019-11-19 NOTE — Addendum Note (Signed)
Addended byDamita Dunnings D on: 11/19/2019 07:48 AM   Modules accepted: Orders

## 2019-12-08 ENCOUNTER — Other Ambulatory Visit: Payer: Self-pay | Admitting: Internal Medicine

## 2019-12-28 DIAGNOSIS — R632 Polyphagia: Secondary | ICD-10-CM | POA: Diagnosis not present

## 2019-12-28 DIAGNOSIS — E559 Vitamin D deficiency, unspecified: Secondary | ICD-10-CM | POA: Diagnosis not present

## 2019-12-28 DIAGNOSIS — D649 Anemia, unspecified: Secondary | ICD-10-CM | POA: Diagnosis not present

## 2019-12-28 DIAGNOSIS — E538 Deficiency of other specified B group vitamins: Secondary | ICD-10-CM | POA: Diagnosis not present

## 2020-02-04 DIAGNOSIS — E559 Vitamin D deficiency, unspecified: Secondary | ICD-10-CM | POA: Diagnosis not present

## 2020-02-04 DIAGNOSIS — E663 Overweight: Secondary | ICD-10-CM | POA: Diagnosis not present

## 2020-02-04 DIAGNOSIS — Z6841 Body Mass Index (BMI) 40.0 and over, adult: Secondary | ICD-10-CM | POA: Diagnosis not present

## 2020-02-04 DIAGNOSIS — Z23 Encounter for immunization: Secondary | ICD-10-CM | POA: Diagnosis not present

## 2020-02-08 ENCOUNTER — Other Ambulatory Visit: Payer: Self-pay | Admitting: Internal Medicine

## 2020-03-09 ENCOUNTER — Telehealth: Payer: Self-pay | Admitting: Internal Medicine

## 2020-03-09 NOTE — Telephone Encounter (Signed)
Requesting: Ambien 10mg  Contract: None UDS: None Last Visit: 09/27/2019 Next Visit: 03/31/2020 Last Refill: 09/27/2019 #30 and 4RF  Please Advise

## 2020-03-09 NOTE — Telephone Encounter (Signed)
Noticed the patient gets phentermine from time to time, prescription sent for Ambien

## 2020-03-10 DIAGNOSIS — E538 Deficiency of other specified B group vitamins: Secondary | ICD-10-CM | POA: Diagnosis not present

## 2020-03-10 DIAGNOSIS — Z6841 Body Mass Index (BMI) 40.0 and over, adult: Secondary | ICD-10-CM | POA: Diagnosis not present

## 2020-03-10 DIAGNOSIS — E663 Overweight: Secondary | ICD-10-CM | POA: Diagnosis not present

## 2020-03-10 DIAGNOSIS — E559 Vitamin D deficiency, unspecified: Secondary | ICD-10-CM | POA: Diagnosis not present

## 2020-03-31 ENCOUNTER — Telehealth (INDEPENDENT_AMBULATORY_CARE_PROVIDER_SITE_OTHER): Payer: Medicare HMO | Admitting: Internal Medicine

## 2020-03-31 ENCOUNTER — Other Ambulatory Visit: Payer: Self-pay

## 2020-03-31 ENCOUNTER — Encounter: Payer: Self-pay | Admitting: Internal Medicine

## 2020-03-31 VITALS — BP 118/80 | Ht 63.0 in | Wt 224.0 lb

## 2020-03-31 DIAGNOSIS — I10 Essential (primary) hypertension: Secondary | ICD-10-CM

## 2020-03-31 DIAGNOSIS — E039 Hypothyroidism, unspecified: Secondary | ICD-10-CM

## 2020-03-31 DIAGNOSIS — E785 Hyperlipidemia, unspecified: Secondary | ICD-10-CM | POA: Diagnosis not present

## 2020-03-31 NOTE — Assessment & Plan Note (Signed)
HTN: Continue Zestoretic, ambulatory BPs never more than 120/80, check CMP, CBC High cholesterol: Good med compliance with simvastatin, check FLP. Hypothyroidism: On Synthroid, checking labs. Paresthesias: As described above, reassess at the next visit in person. Morbid obesity: Reassess on RTC Preventive care: Had a flu shot and COVID-vaccine x3, recommend to proceed with that Shingrix booster at her pharmacy. RTC for labs within few days, my staff will call and arrange RTC CPX 6 months

## 2020-03-31 NOTE — Progress Notes (Signed)
Pre visit review using our clinic review tool, if applicable. No additional management support is needed unless otherwise documented below in the visit note. 

## 2020-03-31 NOTE — Progress Notes (Signed)
Subjective:    Patient ID: Kelly Perkins, female    DOB: 04/25/1950, 70 y.o.   MRN: [id number]  DOS:  03/31/2020 Type of visit - description: Virtual Visit via Video Note  I connected with the above patient  by a video enabled telemedicine application and verified that I am speaking with the correct person using two identifiers.   THIS ENCOUNTER IS A VIRTUAL VISIT DUE TO COVID-19 - PATIENT WAS NOT SEEN IN THE OFFICE. PATIENT HAS CONSENTED TO VIRTUAL VISIT / TELEMEDICINE VISIT   Location of patient: home  Location of provider: office  Persons participating in the virtual visit: patient, provider   I discussed the limitations of evaluation and management by telemedicine and the availability of in person appointments. The patient expressed understanding and agreed to proceed.   Follow-up In general feels well. I review her previous labs. We reviewed her medications, good compliance. Ambulatory BPs very good, always less than 120.    Review of Systems Denies any major symptoms except that here lately has occasional feet paresthesias, different from her nocturnal cramps. Paresthesias can be with or without walking, sometimes at night. No claudication per se, no swelling.  Past Medical History:  Diagnosis Date  . GERD (gastroesophageal reflux disease)   . History of kidney stones   . Hyperlipidemia 09/10/2010  . Hypertension    no meds  . Hypothyroidism   . Normal cardiac stress test 6-12   had CP-DOE  . Osteoarthritis   . Wears dentures    top  . Wears glasses     Past Surgical History:  Procedure Laterality Date  . ABDOMINAL HYSTERECTOMY  2000   B oophoreectomy, d/t DUB  . COLONOSCOPY  2004, 2011  . CYSTOSCOPY/URETEROSCOPY/HOLMIUM LASER/STENT PLACEMENT Right 11/17/2017   Procedure: CYSTOSCOPY/RIGHT URETEROSCOPY/RIGHT HOLMIUM LASER/STENT PLACEMENT;  Surgeon: Ceasar Mons, MD;  Location: [facility];  Service: Urology;  Laterality: Right;  . JOINT REPLACEMENT     . LITHOTRIPSY  2004   urolthiasis   . MASS EXCISION  09/12/2011   Procedure: EXCISION MASS;  Surgeon: Cristine Polio, MD;  Location: [facility];  Service: Plastics;  Laterality: Left;  left ring finger  . POLYPECTOMY     TA 2004  . THYROID LOBECTOMY Left 1990s  . TOTAL KNEE ARTHROPLASTY Left 11/17/2016   Procedure: LEFT TOTAL KNEE ARTHROPLASTY;  Surgeon: Leandrew Koyanagi, MD;  Location: [facility];  Service: Orthopedics;  Laterality: Left;  . TOTAL KNEE ARTHROPLASTY Right 12/04/2017  . TOTAL KNEE ARTHROPLASTY Right 12/04/2017   Procedure: RIGHT TOTAL KNEE ARTHROPLASTY;  Surgeon: Leandrew Koyanagi, MD;  Location: [facility];  Service: Orthopedics;  Laterality: Right;    Allergies as of 03/31/2020   No Known Allergies     Medication List       Accurate as of March 31, 2020  9:29 PM. If you have any questions, ask your nurse or doctor.        albuterol 108 (90 Base) MCG/ACT inhaler Commonly known as: VENTOLIN HFA Inhale 2 puffs into the lungs every 6 (six) hours as needed for wheezing or shortness of breath.   cyclobenzaprine 10 MG tablet Commonly known as: FLEXERIL Take 1 tablet (10 mg total) by mouth at bedtime as needed for muscle spasms.   levothyroxine 137 MCG tablet Commonly known as: SYNTHROID TAKE 1 TABLET (137 MCG TOTAL) BY MOUTH DAILY BEFORE BREAKFAST.   lisinopril-hydrochlorothiazide 10-12.5 MG tablet Commonly known as: ZESTORETIC Take 1 tablet by mouth daily.  meloxicam 7.5 MG tablet Commonly known as: MOBIC Take 1 tablet (7.5 mg total) by mouth daily as needed for pain.   pantoprazole 40 MG tablet Commonly known as: PROTONIX Take 1 tablet (40 mg total) by mouth daily.   simvastatin 20 MG tablet Commonly known as: ZOCOR Take 1 tablet (20 mg total) by mouth at bedtime.   zolpidem 10 MG tablet Commonly known as: AMBIEN TAKE 1 TABLET BY MOUTH AT BEDTIME AS NEEDED FOR SLEEP.          Objective:   Physical Exam BP 118/80   Ht 5\' 3"  (1.6 m)   Wt 224  lb (101.6 kg)   BMI 39.68 kg/m  This is a virtual video assessment, she is alert oriented x3, in no distress.    Assessment      ASSESSMENT HTN Hyperlipidemia Insomnia-- ambien Hypothyroidism, s/p thyroidectomy Morbid obesity GERD Mild anemia, normal iron 12-2013, cscope 08-2015 DJD  h/o kidney stones  CP-DOE: Normal stress test 08/2010  PLAN:   HTN: Continue Zestoretic, ambulatory BPs never more than 120/80, check CMP, CBC High cholesterol: Good med compliance with simvastatin, check FLP. Hypothyroidism: On Synthroid, checking labs. Paresthesias: As described above, reassess at the next visit in person. Morbid obesity: Reassess on RTC Preventive care: Had a flu shot and COVID-vaccine x3, recommend to proceed with that Shingrix booster at her pharmacy. RTC for labs within few days, my staff will call and arrange RTC CPX 6 months    I discussed the assessment and treatment plan with the patient. The patient was provided an opportunity to ask questions and all were answered. The patient agreed with the plan and demonstrated an understanding of the instructions.   The patient was advised to call back or seek an in-person evaluation if the symptoms worsen or if the condition fails to improve as anticipated.

## 2020-04-09 ENCOUNTER — Other Ambulatory Visit: Payer: Self-pay | Admitting: Internal Medicine

## 2020-04-18 DIAGNOSIS — E559 Vitamin D deficiency, unspecified: Secondary | ICD-10-CM | POA: Diagnosis not present

## 2020-04-18 DIAGNOSIS — E538 Deficiency of other specified B group vitamins: Secondary | ICD-10-CM | POA: Diagnosis not present

## 2020-04-18 DIAGNOSIS — E663 Overweight: Secondary | ICD-10-CM | POA: Diagnosis not present

## 2020-04-18 DIAGNOSIS — Z6841 Body Mass Index (BMI) 40.0 and over, adult: Secondary | ICD-10-CM | POA: Diagnosis not present

## 2020-05-10 ENCOUNTER — Other Ambulatory Visit: Payer: Self-pay | Admitting: Internal Medicine

## 2020-05-11 ENCOUNTER — Telehealth: Payer: Self-pay | Admitting: Internal Medicine

## 2020-05-11 NOTE — Telephone Encounter (Signed)
Pt states she had her physical 3 weeks ago virtual and informed Dr. Larose Kells that she would need a refill on her [place]. Pharmacy states that they have not received a new rx yet[facility] [address]

## 2020-05-11 NOTE — Telephone Encounter (Signed)
Rx's declined at this time. See mychart message from 04/30/20 informing Pt she needs to return for blood ordered at virtual visit on 03/31/20.

## 2020-05-11 NOTE — Telephone Encounter (Signed)
Tried calling Pt back- again someone picked up and hung up.

## 2020-05-11 NOTE — Telephone Encounter (Signed)
Tried calling Pt to inform that she still needs blood work from 03/31/20 virtual visit- someone answered then disconnected the call.

## 2020-05-13 ENCOUNTER — Telehealth: Payer: Self-pay | Admitting: Internal Medicine

## 2020-05-13 NOTE — Telephone Encounter (Signed)
I have been trying to contact Pt to let her know that she never returned for her labs from 03/2020 visit. She has hung up twice when I called (see other telephone notes). Can you try to contact her please? Once scheduled I will send a refill request to Dr. Larose Kells.

## 2020-05-13 NOTE — Telephone Encounter (Signed)
Medication: zolpidem (AMBIEN) 10 MG tablet [[id number]]       Has the patient contacted their pharmacy?  (If no, request that the patient contact the pharmacy for the refill.) (If yes, when and what did the pharmacy advise?)     Preferred Pharmacy (with phone number or street name):  [facility] [address] [facility] [address]  [place], [place] [address]  Phone:  [phone] Fax:  [phone]     Agent: Please be advised that RX refills may take up to 3 business days. We ask that you follow-up with your pharmacy.

## 2020-05-14 MED ORDER — ZOLPIDEM TARTRATE 10 MG PO TABS: 10.0000 mg | ORAL_TABLET | Freq: Every evening | ORAL | 4 refills | Status: DC | PRN

## 2020-05-14 NOTE — Telephone Encounter (Signed)
PDMP okay, prescription sent 

## 2020-05-14 NOTE — Telephone Encounter (Signed)
Lab appt scheduled for monday

## 2020-05-14 NOTE — Addendum Note (Signed)
Addended by: Kathlene November E on: 05/14/2020 01:09 PM   Modules accepted: Orders

## 2020-05-14 NOTE — Telephone Encounter (Signed)
Requesting: Ambien 10mg  Contract:None NIO:EVOJ  Last Visit: 03/31/20 Next Visit: None Last Refill: 03/09/2020 #30 AND 1rf  Please Advise

## 2020-05-18 ENCOUNTER — Other Ambulatory Visit: Payer: Self-pay

## 2020-05-18 ENCOUNTER — Other Ambulatory Visit (INDEPENDENT_AMBULATORY_CARE_PROVIDER_SITE_OTHER): Payer: Medicare HMO

## 2020-05-18 DIAGNOSIS — E039 Hypothyroidism, unspecified: Secondary | ICD-10-CM

## 2020-05-18 DIAGNOSIS — E785 Hyperlipidemia, unspecified: Secondary | ICD-10-CM | POA: Diagnosis not present

## 2020-05-18 DIAGNOSIS — I10 Essential (primary) hypertension: Secondary | ICD-10-CM

## 2020-05-18 LAB — LIPID PANEL
Cholesterol: 156 mg/dL (ref 0–200)
HDL: 47.1 mg/dL (ref >39.00)
LDL Cholesterol: 94 mg/dL (ref 0–99)
NonHDL: 108.6
Total CHOL/HDL Ratio: 3
Triglycerides: 71 mg/dL (ref 0.0–149.0)
VLDL: 14.2 mg/dL (ref 0.0–40.0)

## 2020-05-18 LAB — COMPREHENSIVE METABOLIC PANEL
ALT: 17 U/L (ref 0–35)
AST: 17 U/L (ref 0–37)
Albumin: 4.1 g/dL (ref 3.5–5.2)
Alkaline Phosphatase: 91 U/L (ref 39–117)
BUN: 18 mg/dL (ref 6–23)
CO2: 32 mEq/L (ref 19–32)
Calcium: 9.5 mg/dL (ref 8.4–10.5)
Chloride: 103 mEq/L (ref 96–112)
Creatinine, Ser: 0.84 mg/dL (ref 0.40–1.20)
GFR: 69.89 mL/min (ref >60.00)
Glucose, Bld: 97 mg/dL (ref 70–99)
Potassium: 4.1 mEq/L (ref 3.5–5.1)
Sodium: 142 mEq/L (ref 135–145)
Total Bilirubin: 0.5 mg/dL (ref 0.2–1.2)
Total Protein: 6.9 g/dL (ref 6.0–8.3)

## 2020-05-18 LAB — CBC WITH DIFFERENTIAL/PLATELET
Basophils Absolute: 0.1 10*3/uL (ref 0.0–0.1)
Basophils Relative: 1.4 % (ref 0.0–3.0)
Eosinophils Absolute: 0.2 10*3/uL (ref 0.0–0.7)
Eosinophils Relative: 4.4 % (ref 0.0–5.0)
HCT: 34.2 % — ABNORMAL LOW (ref 36.0–46.0)
Hemoglobin: 11.3 g/dL — ABNORMAL LOW (ref 12.0–15.0)
Lymphocytes Relative: 38.9 % (ref 12.0–46.0)
Lymphs Abs: 1.7 10*3/uL (ref 0.7–4.0)
MCHC: 33.2 g/dL (ref 30.0–36.0)
MCV: 93 fl (ref 78.0–100.0)
Monocytes Absolute: 0.5 10*3/uL (ref 0.1–1.0)
Monocytes Relative: 11.6 % (ref 3.0–12.0)
Neutro Abs: 1.9 10*3/uL (ref 1.4–7.7)
Neutrophils Relative %: 43.7 % (ref 43.0–77.0)
Platelets: 201 10*3/uL (ref 150.0–400.0)
RBC: 3.67 Mil/uL — ABNORMAL LOW (ref 3.87–5.11)
RDW: 13.9 % (ref 11.5–15.5)
WBC: 4.3 10*3/uL (ref 4.0–10.5)

## 2020-05-18 LAB — TSH: TSH: 1.78 u[IU]/mL (ref 0.35–4.50)

## 2020-05-25 ENCOUNTER — Other Ambulatory Visit: Payer: Self-pay | Admitting: Internal Medicine

## 2020-05-26 ENCOUNTER — Encounter: Payer: Self-pay | Admitting: Internal Medicine

## 2020-06-11 ENCOUNTER — Other Ambulatory Visit: Payer: Self-pay | Admitting: Internal Medicine

## 2020-07-22 DIAGNOSIS — H524 Presbyopia: Secondary | ICD-10-CM | POA: Diagnosis not present

## 2020-08-14 ENCOUNTER — Other Ambulatory Visit: Payer: Self-pay | Admitting: Internal Medicine

## 2020-09-09 ENCOUNTER — Encounter: Payer: Self-pay | Admitting: Internal Medicine

## 2020-09-12 DIAGNOSIS — E559 Vitamin D deficiency, unspecified: Secondary | ICD-10-CM | POA: Diagnosis not present

## 2020-09-12 DIAGNOSIS — E538 Deficiency of other specified B group vitamins: Secondary | ICD-10-CM | POA: Diagnosis not present

## 2020-09-12 DIAGNOSIS — E8881 Metabolic syndrome: Secondary | ICD-10-CM | POA: Diagnosis not present

## 2020-09-12 DIAGNOSIS — Z6841 Body Mass Index (BMI) 40.0 and over, adult: Secondary | ICD-10-CM | POA: Diagnosis not present

## 2020-09-17 ENCOUNTER — Other Ambulatory Visit: Payer: Self-pay

## 2020-09-17 ENCOUNTER — Ambulatory Visit (AMBULATORY_SURGERY_CENTER): Payer: Medicare HMO

## 2020-09-17 VITALS — Ht 63.0 in | Wt 235.0 lb

## 2020-09-17 DIAGNOSIS — Z86010 Personal history of colonic polyps: Secondary | ICD-10-CM

## 2020-09-17 MED ORDER — NA SULFATE-K SULFATE-MG SULF 17.5-3.13-1.6 GM/177ML PO SOLN: 1.0000 | Freq: Once | ORAL | 0 refills | Status: AC

## 2020-09-17 NOTE — Progress Notes (Signed)
Pre visit completed via phone call; Patient verified name, DOB, and address;  No egg or soy allergy known to patient  No issues with past sedation with any surgeries or procedures Patient denies ever being told they had issues or difficulty with intubation  No FH of Malignant Hyperthermia No diet pills per patient No home 02 use per patient  No blood thinners per patient  Pt denies issues with constipation at this time;  No A fib or A flutter  EMMI video via [place] 19 guidelines implemented in PV today with Pt and RN  Pt is fully vaccinated for Covid   NO PA's for preps discussed with pt in PV today  Discussed with pt there will be an out-of-pocket cost for prep and that varies from $0 to 70 dollars   Due to the COVID-19 pandemic we are asking patients to follow certain guidelines.  Pt aware of COVID protocols and [facility] guidelines

## 2020-09-24 ENCOUNTER — Ambulatory Visit: Payer: Medicare HMO | Admitting: Plastic Surgery

## 2020-09-24 ENCOUNTER — Other Ambulatory Visit: Payer: Self-pay

## 2020-09-24 ENCOUNTER — Encounter: Payer: Self-pay | Admitting: Plastic Surgery

## 2020-09-24 VITALS — BP 124/84 | HR 77 | Ht 63.0 in | Wt 244.2 lb

## 2020-09-24 DIAGNOSIS — L989 Disorder of the skin and subcutaneous tissue, unspecified: Secondary | ICD-10-CM | POA: Diagnosis not present

## 2020-09-24 NOTE — Progress Notes (Signed)
Referring Provider Colon Branch, MD [address] [place],  [place] [address]   CC:  Chief Complaint  Patient presents with   consult      Kelly Perkins is an 70 y.o. female.  HPI: Patient presents with a bothersome lesion on her posterior neck.  Is been present for a number of years and gets caught on things like close.  She would like to be removed.  It is intermittently painful.  No Known Allergies  Outpatient Encounter Medications as of 09/24/2020  Medication Sig   cyclobenzaprine (FLEXERIL) 10 MG tablet Take 1 tablet (10 mg total) by mouth at bedtime as needed for muscle spasms.   levothyroxine (SYNTHROID) 137 MCG tablet Take 1 tablet (137 mcg total) by mouth daily before breakfast.   lisinopril-hydrochlorothiazide (ZESTORETIC) 10-12.5 MG tablet Take 1 tablet by mouth daily.   meloxicam (MOBIC) 7.5 MG tablet Take 1 tablet (7.5 mg total) by mouth daily as needed for pain.   pantoprazole (PROTONIX) 40 MG tablet Take 1 tablet (40 mg total) by mouth daily.   simvastatin (ZOCOR) 20 MG tablet Take 1 tablet (20 mg total) by mouth at bedtime.   zolpidem (AMBIEN) 10 MG tablet Take 1 tablet (10 mg total) by mouth at bedtime as needed. for sleep   No facility-administered encounter medications on file as of 09/24/2020.     Past Medical History:  Diagnosis Date   GERD (gastroesophageal reflux disease)    on meds   History of kidney stones    Hyperlipidemia 09/10/2010   on meds   Hypertension    on meds   Hypothyroidism    on meds   Normal cardiac stress test 08/2010   had CP-DOE   Osteoarthritis    generalizd   Wears dentures    top   Wears glasses     Past Surgical History:  Procedure Laterality Date   ABDOMINAL HYSTERECTOMY  2000   B oophoreectomy, d/t DUB   COLONOSCOPY  2017   JP-MAC-5 yr recall-poor prep-normal   CYSTOSCOPY/URETEROSCOPY/HOLMIUM LASER/STENT PLACEMENT Right 11/17/2017   Procedure: CYSTOSCOPY/RIGHT URETEROSCOPY/RIGHT HOLMIUM LASER/STENT  PLACEMENT;  Surgeon: Ceasar Mons, MD;  Location: [facility];  Service: Urology;  Laterality: Right;   JOINT REPLACEMENT     LITHOTRIPSY  2004   urolthiasis    MASS EXCISION  09/12/2011   Procedure: EXCISION MASS;  Surgeon: Cristine Polio, MD;  Location: [facility];  Service: Plastics;  Laterality: Left;  left ring finger   POLYPECTOMY     TA 2004   THYROIDECTOMY Left 2006   TOTAL KNEE ARTHROPLASTY Left 11/17/2016   Procedure: LEFT TOTAL KNEE ARTHROPLASTY;  Surgeon: Leandrew Koyanagi, MD;  Location: [facility];  Service: Orthopedics;  Laterality: Left;   TOTAL KNEE ARTHROPLASTY Right 12/04/2017   Procedure: RIGHT TOTAL KNEE ARTHROPLASTY;  Surgeon: Leandrew Koyanagi, MD;  Location: [facility];  Service: Orthopedics;  Laterality: Right;    Family History  Problem Relation Age of Onset   Hypertension Mother    Coronary artery disease Mother        onset?   Hypertension Sister    Diabetes Neg Hx    Colon cancer Neg Hx    Breast cancer Neg Hx    Stroke Neg Hx    Rectal cancer Neg Hx    Stomach cancer Neg Hx    Colon polyps Neg Hx     Social History   Social History Narrative   Single,  mom is one of my patients [name].     lives by herself     Review of Systems General: Denies fevers, chills, weight loss CV: Denies chest pain, shortness of breath, palpitations  Physical Exam Vitals with BMI 09/24/2020 09/17/2020 03/31/2020  Height _0  _1  _2   Weight 244 lbs 3 oz 235 lbs 224 lbs  BMI 43.27 44.62 86.38  Systolic 177 - 116  Diastolic 84 - 80  Pulse 77 - -    General:  No acute distress,  Alert and oriented, Non-Toxic, Normal speech and affect The posterior aspect of the right neck there is a skin tag-like pedunculated lesion.  Its about a centimeter in size.  It is pigmented.  No surrounding skin changes and well-defined borders.  Assessment/Plan Patient presents with a skin growth on the posterior aspect of her neck.  Symptomatic and I think is  reasonable to excise it.  We discussed excision and the risk that include bleeding, infection, damage to surrounding structures and need for additional procedures.  All her questions were answered we will plan to get this scheduled for her.  Kelly Perkins 09/24/2020, 12:48 PM

## 2020-09-25 ENCOUNTER — Other Ambulatory Visit: Payer: Self-pay | Admitting: Internal Medicine

## 2020-09-25 DIAGNOSIS — Z1231 Encounter for screening mammogram for malignant neoplasm of breast: Secondary | ICD-10-CM

## 2020-09-29 ENCOUNTER — Ambulatory Visit (INDEPENDENT_AMBULATORY_CARE_PROVIDER_SITE_OTHER): Payer: Medicare HMO | Admitting: Internal Medicine

## 2020-09-29 ENCOUNTER — Other Ambulatory Visit: Payer: Self-pay

## 2020-09-29 VITALS — BP 136/80 | HR 68 | Temp 98.1°F | Resp 18 | Ht 63.0 in | Wt 244.8 lb

## 2020-09-29 DIAGNOSIS — R202 Paresthesia of skin: Secondary | ICD-10-CM | POA: Diagnosis not present

## 2020-09-29 NOTE — Progress Notes (Signed)
Subjective:    Patient ID: Kelly Perkins, female    DOB: [birth year], 70 y.o.   MRN: [id number]  DOS:  09/29/2020 Type of visit - description: Acute  Her main concern is paresthesias, described as numbness, feet>hands, started several months ago (she already mentioned that when I saw her virtually 03-2020). The pain is worse when she stands up for a while, no worse when she walks. No worse at night.   Review of Systems Denies neck pain or back pain. No sciatica type of symptoms No claudication Very seldom has lower extremity edema  Past Medical History:  Diagnosis Date   GERD (gastroesophageal reflux disease)    on meds   History of kidney stones    Hyperlipidemia 09/10/2010   on meds   Hypertension    on meds   Hypothyroidism    on meds   Normal cardiac stress test 08/2010   had CP-DOE   Osteoarthritis    generalizd   Wears dentures    top   Wears glasses     Past Surgical History:  Procedure Laterality Date   ABDOMINAL HYSTERECTOMY  2000   B oophoreectomy, d/t DUB   COLONOSCOPY  2017   JP-MAC-5 yr recall-poor prep-normal   CYSTOSCOPY/URETEROSCOPY/HOLMIUM LASER/STENT PLACEMENT Right 11/17/2017   Procedure: CYSTOSCOPY/RIGHT URETEROSCOPY/RIGHT HOLMIUM LASER/STENT PLACEMENT;  Surgeon: Ceasar Mons, MD;  Location[facility];  Service: Urology;  Laterality: Right;   JOINT REPLACEMENT     LITHOTRIPSY  2004   urolthiasis    MASS EXCISION  09/12/2011   Procedure: EXCISION MASS;  Surgeon: Cristine Polio, MD;  Location: [facility];  Service: Plastics;  Laterality: Left;  left ring finger   POLYPECTOMY     TA 2004   THYROIDECTOMY Left 2006   TOTAL KNEE ARTHROPLASTY Left 11/17/2016   Procedure: LEFT TOTAL KNEE ARTHROPLASTY;  Surgeon: Leandrew Koyanagi, MD;  Location: [facility];  Service: Orthopedics;  Laterality: Left;   TOTAL KNEE ARTHROPLASTY Right 12/04/2017   Procedure: RIGHT TOTAL KNEE ARTHROPLASTY;  Surgeon: Leandrew Koyanagi, MD;  Location: [facility];   Service: Orthopedics;  Laterality: Right;    Allergies as of 09/29/2020   No Known Allergies      Medication List        Accurate as of September 29, 2020 11:59 PM. If you have any questions, ask your nurse or doctor.          STOP taking these medications    cyclobenzaprine 10 MG tablet Commonly known as: FLEXERIL Stopped by: Kathlene November, MD       TAKE these medications    levothyroxine 137 MCG tablet Commonly known as: SYNTHROID Take 1 tablet (137 mcg total) by mouth daily before breakfast.   lisinopril-hydrochlorothiazide 10-12.5 MG tablet Commonly known as: ZESTORETIC Take 1 tablet by mouth daily.   meloxicam 7.5 MG tablet Commonly known as: MOBIC Take 1 tablet (7.5 mg total) by mouth daily as needed for pain.   pantoprazole 40 MG tablet Commonly known as: PROTONIX Take 1 tablet (40 mg total) by mouth daily.   simvastatin 20 MG tablet Commonly known as: ZOCOR Take 1 tablet (20 mg total) by mouth at bedtime.   zolpidem 10 MG tablet Commonly known as: AMBIEN Take 1 tablet (10 mg total) by mouth at bedtime as needed. for sleep           Objective:   Physical Exam BP 136/80 (BP Location: Left Arm, Patient Position: Sitting, Cuff Size: Large)  Pulse 68   Temp 98.1 F (36.7 C) (Oral)   Resp 18   Ht _0  (1.6 m)   Wt 244 lb 12.8 oz (111 kg)   SpO2 99%   BMI 43.36 kg/m  General:   Well developed, NAD, BMI noted. HEENT:  Normocephalic . Face symmetric, atraumatic Lungs:  CTA B Normal respiratory effort, no intercostal retractions, no accessory muscle use. Heart: RRR,  no murmur.  Lower extremities: no pretibial edema bilaterally.  Good pedal pulses bilaterally Skin: Not pale. Not jaundice Neurologic:  alert & oriented X3.  Speech normal, gait appropriate for age and unassisted. Motor and DTR symmetric except for absent bilateral knee jerk (had TKRs) Psych--  Cognition and judgment appear intact.  Cooperative with normal attention span and  concentration.  Behavior appropriate. No anxious or depressed appearing.      Assessment     ASSESSMENT HTN Hyperlipidemia Insomnia-- ambien Hypothyroidism, s/p thyroidectomy Morbid obesity GERD Mild anemia, normal iron 12-2013, cscope 08-2015 DJD  h/o kidney stones  CP-DOE: Normal stress test 08/2010   PLAN: Paresthesias, feet>hand: As described above, thyroid disease is well controlled, last A1c 5.2, no history of vitamins deficiency. Etiology unclear, the patient really likes know where this is coming from. Plan: Refer to neurology RTC CPX at her convenience.  This visit occurred during the SARS-CoV-2 public health emergency.  Safety protocols were in place, including screening questions prior to the visit, additional usage of staff PPE, and extensive cleaning of exam room while observing appropriate contact time as indicated for disinfecting solutions.

## 2020-09-29 NOTE — Patient Instructions (Signed)
We will refer you to colleges.  Please arrange for a physical exam at your convenience

## 2020-09-30 ENCOUNTER — Encounter: Payer: Self-pay | Admitting: Neurology

## 2020-09-30 NOTE — Assessment & Plan Note (Signed)
  Paresthesias, feet>hand: As described above, thyroid disease is well controlled, last A1c 5.2, no history of vitamins deficiency. Etiology unclear, the patient really likes know where this is coming from. Plan: Refer to neurology RTC CPX at her convenience.

## 2020-10-01 ENCOUNTER — Encounter: Payer: Self-pay | Admitting: Internal Medicine

## 2020-10-01 ENCOUNTER — Other Ambulatory Visit: Payer: Self-pay

## 2020-10-01 ENCOUNTER — Ambulatory Visit (AMBULATORY_SURGERY_CENTER): Payer: Medicare HMO | Admitting: Internal Medicine

## 2020-10-01 VITALS — BP 101/71 | HR 57 | Temp 96.8°F | Resp 15 | Ht 63.0 in | Wt 235.0 lb

## 2020-10-01 DIAGNOSIS — D123 Benign neoplasm of transverse colon: Secondary | ICD-10-CM | POA: Diagnosis not present

## 2020-10-01 DIAGNOSIS — D120 Benign neoplasm of cecum: Secondary | ICD-10-CM

## 2020-10-01 DIAGNOSIS — D125 Benign neoplasm of sigmoid colon: Secondary | ICD-10-CM

## 2020-10-01 DIAGNOSIS — Z86010 Personal history of colonic polyps: Secondary | ICD-10-CM | POA: Diagnosis not present

## 2020-10-01 MED ORDER — SODIUM CHLORIDE 0.9 % IV SOLN: 500.0000 mL | Freq: Once | INTRAVENOUS | Status: DC

## 2020-10-01 NOTE — Progress Notes (Signed)
Called to room to assist during endoscopic procedure.  Patient ID and intended procedure confirmed with present staff. Received instructions for my participation in the procedure from the performing physician.  

## 2020-10-01 NOTE — Patient Instructions (Signed)
Handout on polyps given. ° °YOU HAD AN ENDOSCOPIC PROCEDURE TODAY AT THE [facility]:   Refer to the procedure report that was given to you for any specific questions about what was found during the examination.  If the procedure report does not answer your questions, please call your gastroenterologist to clarify.  If you requested that your care partner not be given the details of your procedure findings, then the procedure report has been included in a sealed envelope for you to review at your convenience later. ° °YOU SHOULD EXPECT: Some feelings of bloating in the abdomen. Passage of more gas than usual.  Walking can help get rid of the air that was put into your GI tract during the procedure and reduce the bloating. If you had a lower endoscopy (such as a colonoscopy or flexible sigmoidoscopy) you may notice spotting of blood in your stool or on the toilet paper. If you underwent a bowel prep for your procedure, you may not have a normal bowel movement for a few days. ° °Please Note:  You might notice some irritation and congestion in your nose or some drainage.  This is from the oxygen used during your procedure.  There is no need for concern and it should clear up in a day or so. ° °SYMPTOMS TO REPORT IMMEDIATELY: ° °Following lower endoscopy (colonoscopy or flexible sigmoidoscopy): ° Excessive amounts of blood in the stool ° Significant tenderness or worsening of abdominal pains ° Swelling of the abdomen that is new, acute ° Fever of 100°F or higher ° °For urgent or emergent issues, a gastroenterologist can be reached at any hour by calling [phone]. °Do not use MyChart messaging for urgent concerns.  ° ° °DIET:  We do recommend a small meal at first, but then you may proceed to your regular diet.  Drink plenty of fluids but you should avoid alcoholic beverages for 24 hours. ° °ACTIVITY:  You should plan to take it easy for the rest of today and you should NOT DRIVE or use heavy machinery  until tomorrow (because of the sedation medicines used during the test).   ° °FOLLOW UP: °Our staff will call the number listed on your records 48-72 hours following your procedure to check on you and address any questions or concerns that you may have regarding the information given to you following your procedure. If we do not reach you, we will leave a message.  We will attempt to reach you two times.  During this call, we will ask if you have developed any symptoms of COVID 19. If you develop any symptoms (ie: fever, flu-like symptoms, shortness of breath, cough etc.) before then, please call [phone].  If you test positive for Covid 19 in the 2 weeks post procedure, please call and report this information to us.   ° °If any biopsies were taken you will be contacted by phone or by letter within the next 1-3 weeks.  Please call us at [phone] if you have not heard about the biopsies in 3 weeks.  ° ° °SIGNATURES/CONFIDENTIALITY: °You and/or your care partner have signed paperwork which will be entered into your electronic medical record.  These signatures attest to the fact that that the information above on your After Visit Summary has been reviewed and is understood.  Full responsibility of the confidentiality of this discharge information lies with you and/or your care-partner.  °

## 2020-10-01 NOTE — Progress Notes (Signed)
pt tolerated well. VSS. awake and to recovery. Report given to RN.  

## 2020-10-01 NOTE — Progress Notes (Signed)
VS by [place]  I have reviewed the patient's medical history in detail and updated the computerized patient record.  

## 2020-10-01 NOTE — Op Note (Signed)
[place] Patient Name: Kelly Perkins Procedure Date: 10/01/2020 1:33 PM MRN: [id number] Endoscopist: Docia Chuck. Henrene Pastor , MD Age: 70 Referring MD:  Date of Birth: 01-13-1951 Gender: Female Account #: [id number] Procedure:                Colonoscopy with cold snare polypectomy x 4 Indications:              High risk colon cancer surveillance: Personal                            history of non-advanced adenoma (2004 with fair                            prep); 2011 (poor prep); 2017 (adequate prep). Medicines:                Monitored Anesthesia Care Procedure:                Pre-Anesthesia Assessment:                           - Prior to the procedure, a History and Physical                            was performed, and patient medications and                            allergies were reviewed. The patient's tolerance of                            previous anesthesia was also reviewed. The risks                            and benefits of the procedure and the sedation                            options and risks were discussed with the patient.                            All questions were answered, and informed consent                            was obtained. Prior Anticoagulants: The patient has                            taken no previous anticoagulant or antiplatelet                            agents. After reviewing the risks and benefits, the                            patient was deemed in satisfactory condition to                            undergo the procedure.  After obtaining informed consent, the colonoscope                            was passed under direct vision. Throughout the                            procedure, the patient's blood pressure, pulse, and                            oxygen saturations were monitored continuously. The                            CF [id number] #[id number] was introduced through the anus                            and  advanced to the the cecum, identified by                            appendiceal orifice and ileocecal valve. The                            ileocecal valve, appendiceal orifice, and rectum                            were photographed. The quality of the bowel                            preparation was excellent. The colonoscopy was                            performed without difficulty. The patient tolerated                            the procedure well. The bowel preparation used was                            more extensive with 2 days of clear liquids,                            magnesium citrate, and SUPREP via split dose                            instruction. Scope In: 1:55:02 PM Scope Out: 2:15:33 PM Scope Withdrawal Time: 0 hours 16 minutes 16 seconds  Total Procedure Duration: 0 hours 20 minutes 31 seconds  Findings:                 Four polyps were found in the sigmoid colon,                            transverse colon and cecum. The polyps were 2 to 4                            mm in size. These polyps were removed  with a cold                            snare. Resection and retrieval were complete.                           The exam was otherwise without abnormality on                            direct and retroflexion views. Internal hemorrhoids                            present. Complications:            No immediate complications. Estimated blood loss:                            None. Estimated Blood Loss:     Estimated blood loss: none. Impression:               - Four 2 to 4 mm polyps in the sigmoid colon, in                            the transverse colon and in the cecum, removed with                            a cold snare. Resected and retrieved.                           - The examination was otherwise normal on direct                            and retroflexion views. Internal hemorrhoids                            present. Recommendation:           - Repeat  colonoscopy in 5-7 years for surveillance.                            SAME EXTENSIVE PREP.                           - Patient has a contact number available for                            emergencies. The signs and symptoms of potential                            delayed complications were discussed with the                            patient. Return to normal activities tomorrow.                            Written discharge instructions were provided to the  patient.                           - Resume previous diet.                           - Continue present medications.                           - Await pathology results. Docia Chuck. Henrene Pastor, MD 10/01/2020 2:36:09 PM This report has been signed electronically.

## 2020-10-05 ENCOUNTER — Telehealth: Payer: Self-pay | Admitting: *Deleted

## 2020-10-05 NOTE — Telephone Encounter (Signed)
  Follow up Call-  Call back number 10/01/2020  Post procedure Call Back phone  # [phone]  Permission to leave phone message Yes  Some recent data might be hidden     Patient questions:  Do you have a fever, pain , or abdominal swelling? No. Pain Score  0 *  Have you tolerated food without any problems? Yes.    Have you been able to return to your normal activities? Yes.    Do you have any questions about your discharge instructions: Diet   No. Medications  No. Follow up visit  No.  Do you have questions or concerns about your Care? No.  Actions: * If pain score is 4 or above: No action needed, pain <4.  Have you developed a fever since your procedure? no  2.   Have you had an respiratory symptoms (SOB or cough) since your procedure? no  3.   Have you tested positive for COVID 19 since your procedure no  4.   Have you had any family members/close contacts diagnosed with the COVID 19 since your procedure?  no   If yes to any of these questions please route to Joylene John, RN and Joella Prince, RN

## 2020-10-08 ENCOUNTER — Encounter: Payer: Self-pay | Admitting: Internal Medicine

## 2020-10-09 ENCOUNTER — Telehealth: Payer: Self-pay | Admitting: Internal Medicine

## 2020-10-12 NOTE — Telephone Encounter (Signed)
Requesting: Ambien '10mg'$  Contract: None UDS: None Last Visit: 09/29/2020 Next Visit: None Last Refill: 05/14/2020 #30 and 4RF  Please Advise

## 2020-10-12 NOTE — Telephone Encounter (Signed)
PDMP okay, apparently she takes sporadically phentermine.  Prescription sent

## 2020-10-24 DIAGNOSIS — Z1339 Encounter for screening examination for other mental health and behavioral disorders: Secondary | ICD-10-CM | POA: Diagnosis not present

## 2020-10-24 DIAGNOSIS — E8881 Metabolic syndrome: Secondary | ICD-10-CM | POA: Diagnosis not present

## 2020-10-24 DIAGNOSIS — E538 Deficiency of other specified B group vitamins: Secondary | ICD-10-CM | POA: Diagnosis not present

## 2020-10-24 DIAGNOSIS — Z6841 Body Mass Index (BMI) 40.0 and over, adult: Secondary | ICD-10-CM | POA: Diagnosis not present

## 2020-10-24 DIAGNOSIS — E559 Vitamin D deficiency, unspecified: Secondary | ICD-10-CM | POA: Diagnosis not present

## 2020-10-24 DIAGNOSIS — Z1159 Encounter for screening for other viral diseases: Secondary | ICD-10-CM | POA: Diagnosis not present

## 2020-10-24 DIAGNOSIS — Z79899 Other long term (current) drug therapy: Secondary | ICD-10-CM | POA: Diagnosis not present

## 2020-10-24 DIAGNOSIS — Z1331 Encounter for screening for depression: Secondary | ICD-10-CM | POA: Diagnosis not present

## 2020-11-09 ENCOUNTER — Other Ambulatory Visit: Payer: Self-pay | Admitting: Internal Medicine

## 2020-11-19 ENCOUNTER — Ambulatory Visit: Payer: Medicare HMO

## 2020-11-19 ENCOUNTER — Other Ambulatory Visit: Payer: Self-pay | Admitting: Internal Medicine

## 2020-11-19 ENCOUNTER — Other Ambulatory Visit: Payer: Self-pay

## 2020-11-26 ENCOUNTER — Other Ambulatory Visit: Payer: Self-pay | Admitting: Internal Medicine

## 2020-11-26 DIAGNOSIS — K219 Gastro-esophageal reflux disease without esophagitis: Secondary | ICD-10-CM

## 2020-12-04 ENCOUNTER — Ambulatory Visit: Payer: Medicare HMO | Admitting: Neurology

## 2020-12-09 ENCOUNTER — Encounter: Payer: Self-pay | Admitting: Plastic Surgery

## 2020-12-09 ENCOUNTER — Other Ambulatory Visit: Payer: Self-pay

## 2020-12-09 ENCOUNTER — Ambulatory Visit: Payer: Medicare HMO | Admitting: Plastic Surgery

## 2020-12-09 VITALS — BP 118/76 | HR 65

## 2020-12-09 DIAGNOSIS — L989 Disorder of the skin and subcutaneous tissue, unspecified: Secondary | ICD-10-CM | POA: Diagnosis not present

## 2020-12-09 NOTE — Progress Notes (Signed)
Operative Note   DATE OF OPERATION: 12/09/2020  LOCATION:    SURGICAL DEPARTMENT: Plastic Surgery  PREOPERATIVE DIAGNOSES: Pedunculated skin lesion posterior neck  POSTOPERATIVE DIAGNOSES:  same  PROCEDURE:  Shave excision posterior neck skin lesion totaling 7 mm in size  SURGEON: Talmadge Coventry, MD  ANESTHESIA:  Local  COMPLICATIONS: None.   INDICATIONS FOR PROCEDURE:  The patient, Kelly Perkins is a 70 y.o. female born on 1950-06-03, is here for treatment of severe neck skin lesion MRN: [id number]  CONSENT:  Informed consent was obtained directly from the patient. Risks, benefits and alternatives were fully discussed. Specific risks including but not limited to bleeding, infection, hematoma, seroma, scarring, pain, infection, wound healing problems, and need for further surgery were all discussed. The patient did have an ample opportunity to have questions answered to satisfaction.   DESCRIPTION OF PROCEDURE:  Local anesthesia was administered. The patient's operative site was prepped and draped in a sterile fashion. A time out was performed and all information was confirmed to be correct.  The lesion was excised with a 15 blade.  Hemostasis was obtained.  Dressing was applied.  Skin lesion measured about 7 mm in diameter  The patient tolerated the procedure well.  There were no complications.

## 2020-12-16 ENCOUNTER — Ambulatory Visit
Admission: RE | Admit: 2020-12-16 | Discharge: 2020-12-16 | Disposition: A | Discharge status: Home / Self Care | Payer: Medicare HMO | Source: Ambulatory Visit | Attending: Internal Medicine | Admitting: Internal Medicine

## 2020-12-16 ENCOUNTER — Other Ambulatory Visit: Payer: Self-pay

## 2020-12-16 DIAGNOSIS — Z1231 Encounter for screening mammogram for malignant neoplasm of breast: Secondary | ICD-10-CM

## 2021-01-08 DIAGNOSIS — Z6841 Body Mass Index (BMI) 40.0 and over, adult: Secondary | ICD-10-CM | POA: Diagnosis not present

## 2021-01-08 DIAGNOSIS — E538 Deficiency of other specified B group vitamins: Secondary | ICD-10-CM | POA: Diagnosis not present

## 2021-01-08 DIAGNOSIS — Z0000 Encounter for general adult medical examination without abnormal findings: Secondary | ICD-10-CM | POA: Diagnosis not present

## 2021-01-08 DIAGNOSIS — E559 Vitamin D deficiency, unspecified: Secondary | ICD-10-CM | POA: Diagnosis not present

## 2021-01-08 DIAGNOSIS — E663 Overweight: Secondary | ICD-10-CM | POA: Diagnosis not present

## 2021-01-08 DIAGNOSIS — E8881 Metabolic syndrome: Secondary | ICD-10-CM | POA: Diagnosis not present

## 2021-01-08 DIAGNOSIS — D539 Nutritional anemia, unspecified: Secondary | ICD-10-CM | POA: Diagnosis not present

## 2021-01-24 ENCOUNTER — Other Ambulatory Visit: Payer: Self-pay | Admitting: Internal Medicine

## 2021-02-06 ENCOUNTER — Other Ambulatory Visit: Payer: Self-pay | Admitting: Internal Medicine

## 2021-02-06 DIAGNOSIS — K219 Gastro-esophageal reflux disease without esophagitis: Secondary | ICD-10-CM

## 2021-02-08 ENCOUNTER — Telehealth: Payer: Self-pay | Admitting: Internal Medicine

## 2021-02-08 MED ORDER — PANTOPRAZOLE SODIUM 40 MG PO TBEC: 40.0000 mg | DELAYED_RELEASE_TABLET | Freq: Every day | ORAL | 0 refills | Status: DC

## 2021-02-08 MED ORDER — ZOLPIDEM TARTRATE 10 MG PO TABS
10.0000 mg | ORAL_TABLET | Freq: Every evening | ORAL | 0 refills | Status: DC | PRN
Start: 2021-02-08 — End: 2021-03-12

## 2021-02-08 MED ORDER — LEVOTHYROXINE SODIUM 137 MCG PO TABS: 137.0000 ug | ORAL_TABLET | Freq: Every day | ORAL | 0 refills | Status: DC

## 2021-02-08 MED ORDER — LISINOPRIL-HYDROCHLOROTHIAZIDE 10-12.5 MG PO TABS: 1.0000 | ORAL_TABLET | Freq: Every day | ORAL | 0 refills | Status: DC

## 2021-02-08 MED ORDER — SIMVASTATIN 20 MG PO TABS: ORAL_TABLET | ORAL | 0 refills | Status: DC

## 2021-02-08 NOTE — Addendum Note (Signed)
Addended byDamita Dunnings D on: 02/08/2021 09:42 AM   Modules accepted: Orders

## 2021-02-08 NOTE — Telephone Encounter (Signed)
Medication:  pantoprazole (PROTONIX) 40 MG tablet [[id number]]  levothyroxine (SYNTHROID) 137 MCG tablet [[id number]] zolpidem (AMBIEN) 10 MG tablet [[id number]]   Has the patient contacted their pharmacy? Yes.   (If no, request that the patient contact the pharmacy for the refill.) (If yes, when and what did the pharmacy advise?) Appointment has to be scheduled for meds to be refilled    Preferred Pharmacy (with phone number or street name):  [facility] [place] [facility] [address]  [place], [place] [address]  Phone:  [phone]  Fax:  [phone]    Agent: Please be advised that RX refills may take up to 3 business days. We ask that you follow-up with your pharmacy.   Pt scheduled medication appointment for 12/12 @ 11.

## 2021-02-08 NOTE — Telephone Encounter (Signed)
She has a office visit scheduled with me soon, PDMP okay, Rx sent.

## 2021-02-08 NOTE — Telephone Encounter (Signed)
Requesting: Ambien 10mg  Contract: None UDS: None Last Visit: 09/29/20 Next Visit: 02/22/2021 Last Refill:10/12/20 #30 and 3RF  Other meds sent.   Please Advise

## 2021-02-17 ENCOUNTER — Other Ambulatory Visit: Payer: Self-pay | Admitting: Internal Medicine

## 2021-02-17 DIAGNOSIS — K219 Gastro-esophageal reflux disease without esophagitis: Secondary | ICD-10-CM

## 2021-02-19 ENCOUNTER — Ambulatory Visit: Payer: Medicare HMO | Admitting: Neurology

## 2021-02-20 ENCOUNTER — Other Ambulatory Visit: Payer: Self-pay | Admitting: Internal Medicine

## 2021-02-22 ENCOUNTER — Ambulatory Visit: Payer: Medicare HMO | Admitting: Internal Medicine

## 2021-02-22 ENCOUNTER — Encounter: Payer: Self-pay | Admitting: Internal Medicine

## 2021-02-22 NOTE — Telephone Encounter (Signed)
Appt later today.

## 2021-03-02 ENCOUNTER — Encounter: Payer: Self-pay | Admitting: Internal Medicine

## 2021-03-02 ENCOUNTER — Ambulatory Visit (INDEPENDENT_AMBULATORY_CARE_PROVIDER_SITE_OTHER): Payer: Medicare HMO | Admitting: Internal Medicine

## 2021-03-02 VITALS — BP 132/80 | HR 78 | Temp 98.0°F | Resp 18 | Ht 63.0 in | Wt 236.4 lb

## 2021-03-02 DIAGNOSIS — G4700 Insomnia, unspecified: Secondary | ICD-10-CM

## 2021-03-02 DIAGNOSIS — I10 Essential (primary) hypertension: Secondary | ICD-10-CM | POA: Diagnosis not present

## 2021-03-02 DIAGNOSIS — Z6841 Body Mass Index (BMI) 40.0 and over, adult: Secondary | ICD-10-CM

## 2021-03-02 DIAGNOSIS — K219 Gastro-esophageal reflux disease without esophagitis: Secondary | ICD-10-CM

## 2021-03-02 DIAGNOSIS — E039 Hypothyroidism, unspecified: Secondary | ICD-10-CM | POA: Diagnosis not present

## 2021-03-02 DIAGNOSIS — E785 Hyperlipidemia, unspecified: Secondary | ICD-10-CM | POA: Diagnosis not present

## 2021-03-02 DIAGNOSIS — Z0001 Encounter for general adult medical examination with abnormal findings: Secondary | ICD-10-CM

## 2021-03-02 DIAGNOSIS — Z0000 Encounter for general adult medical examination without abnormal findings: Secondary | ICD-10-CM | POA: Diagnosis not present

## 2021-03-02 MED ORDER — LISINOPRIL-HYDROCHLOROTHIAZIDE 10-12.5 MG PO TABS: 1.0000 | ORAL_TABLET | Freq: Every day | ORAL | 1 refills | Status: DC

## 2021-03-02 MED ORDER — HYDROCODONE-ACETAMINOPHEN 5-325 MG PO TABS: 1.0000 | ORAL_TABLET | Freq: Every day | ORAL | 0 refills | Status: DC | PRN

## 2021-03-02 MED ORDER — SIMVASTATIN 20 MG PO TABS: 20.0000 mg | ORAL_TABLET | Freq: Every day | ORAL | 1 refills | Status: DC

## 2021-03-02 MED ORDER — PANTOPRAZOLE SODIUM 40 MG PO TBEC: 40.0000 mg | DELAYED_RELEASE_TABLET | Freq: Every day | ORAL | 1 refills | Status: DC

## 2021-03-02 MED ORDER — LEVOTHYROXINE SODIUM 137 MCG PO TABS: 137.0000 ug | ORAL_TABLET | Freq: Every day | ORAL | 1 refills | Status: DC

## 2021-03-02 NOTE — Progress Notes (Signed)
Subjective:    Patient ID: Kelly Perkins, female    DOB: 08-19-1950, 70 y.o.   MRN: [id number]  DOS:  03/02/2021 Type of visit - description: CPX  Here for CPX Developed viral syndrome 10 days ago, COVID test negative, she thinks she had the flu. Overall she is better, still coughing some yellowish sputum.  Still somewhat weak.  Review of Systems  Other than above, a 14 point review of systems is negative     Past Medical History:  Diagnosis Date   GERD (gastroesophageal reflux disease)    on meds   History of kidney stones    Hyperlipidemia 09/10/2010   on meds   Hypertension    on meds   Hypothyroidism    on meds   Normal cardiac stress test 08/2010   had CP-DOE   Osteoarthritis    generalizd   Wears dentures    top   Wears glasses     Past Surgical History:  Procedure Laterality Date   ABDOMINAL HYSTERECTOMY  2000   B oophoreectomy, d/t DUB   COLONOSCOPY  2017   JP-MAC-5 yr recall-poor prep-normal   CYSTOSCOPY/URETEROSCOPY/HOLMIUM LASER/STENT PLACEMENT Right 11/17/2017   Procedure: CYSTOSCOPY/RIGHT URETEROSCOPY/RIGHT HOLMIUM LASER/STENT PLACEMENT;  Surgeon: Ceasar Mons, MD;  Location: [facility];  Service: Urology;  Laterality: Right;   JOINT REPLACEMENT     LITHOTRIPSY  2004   urolthiasis    MASS EXCISION  09/12/2011   Procedure: EXCISION MASS;  Surgeon: Cristine Polio, MD;  Location: [facility];  Service: Plastics;  Laterality: Left;  left ring finger   POLYPECTOMY     TA 2004   THYROIDECTOMY Left 2006   TOTAL KNEE ARTHROPLASTY Left 11/17/2016   Procedure: LEFT TOTAL KNEE ARTHROPLASTY;  Surgeon: Leandrew Koyanagi, MD;  Location: [facility];  Service: Orthopedics;  Laterality: Left;   TOTAL KNEE ARTHROPLASTY Right 12/04/2017   Procedure: RIGHT TOTAL KNEE ARTHROPLASTY;  Surgeon: Leandrew Koyanagi, MD;  Location: [facility];  Service: Orthopedics;  Laterality: Right;   Social History   Socioeconomic History   Marital status: Divorced    Spouse  name: Not on file   Number of children: 2   Years of education: Not on file   Highest education level: Not on file  Occupational History   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.     lives by herself   Social Determinants of Health   Financial Resource Strain: Not on file  Food Insecurity: Not on file  Transportation Needs: Not on file  Physical Activity: Not on file  Stress: Not on file  Social Connections: Not on file  Intimate Partner Violence: Not on file    Allergies as of 03/02/2021   No Known Allergies      Medication List        Accurate as of March 02, 2021 11:59 PM. If you have any questions, ask your nurse or doctor.          STOP  taking these medications    meloxicam 7.5 MG tablet Commonly known as: MOBIC Stopped by: Kathlene November, MD       TAKE these medications    HYDROcodone-acetaminophen 5-325 MG tablet Commonly known as: NORCO/VICODIN Take 1-2 tablets by mouth daily as needed. Started by: Kathlene November, MD   levothyroxine 137 MCG tablet Commonly known as: SYNTHROID Take 1 tablet (137 mcg total) by mouth daily before breakfast.   lisinopril-hydrochlorothiazide 10-12.5 MG tablet Commonly known as: ZESTORETIC Take 1 tablet by mouth daily.   pantoprazole 40 MG tablet Commonly known as: PROTONIX Take 1 tablet (40 mg total) by mouth daily.   simvastatin 20 MG tablet Commonly known as: ZOCOR Take 1 tablet (20 mg total) by mouth at bedtime. What changed:  how much to take how to take this when to take this additional  instructions Changed by: Kathlene November, MD   zolpidem 10 MG tablet Commonly known as: AMBIEN Take 1 tablet (10 mg total) by mouth at bedtime as needed for sleep.           Objective:   Physical Exam BP 132/80 (BP Location: Left Arm, Patient Position: Sitting, Cuff Size: Normal)    Pulse 78    Temp 98 F (36.7 C) (Oral)    Resp 18    Ht _0  (1.6 m)    Wt 236 lb 6 oz (107.2 kg)    SpO2 97%    BMI 41.87 kg/m  General: Well developed, NAD, BMI noted Neck: No  thyromegaly  HEENT:  Normocephalic . Face symmetric, atraumatic. Nose congested, no TTP of the sinus area. Lungs:  Minimal rhonchi if any, clear with cough. Normal respiratory effort, no intercostal retractions, no accessory muscle use. Heart: RRR,  no murmur.  Abdomen:  Not distended, soft, non-tender. No rebound or rigidity.   Lower extremities: no pretibial edema bilaterally  Skin: Exposed areas without rash. Not pale. Not jaundice Neurologic:  alert & oriented X3.  Speech normal, gait appropriate for age and unassisted Strength symmetric and appropriate for age.  Psych: Cognition and judgment appear intact.  Cooperative with normal attention span and concentration.  Behavior appropriate. No anxious or depressed appearing.     Assessment    ASSESSMENT HTN Hyperlipidemia Insomnia-- ambien Hypothyroidism, s/p thyroidectomy Morbid obesity GERD Mild anemia, normal iron 12-2013, cscope 08-2015 DJD  h/o kidney stones  CP-DOE: Normal stress test 08/2010   PLAN: Here for CPX HTN: BP today is very good, continue Zestoretic. Insomnia: On Ambien, contract signed. Hypothyroidism: On Synthroid, check labs DJD: Patient stopped meloxicam, it was ineffective.  She has multiple areas that hurt, denies persistent headache, fevers. Plan: Tylenol, hydrocodone 5 mg 1-2 tabs  daily if needed only.  Weight loss would also be extremely helpful.  See next. Morbid obesity: Diet exercise discussed, wellness clinic?  Info  provided. RTC 3 months  In addition to CPX, I addressed all her chronic medical problems including providing a new prescription for DJD advising about morbid obesity.  This visit occurred during the SARS-CoV-2 public health emergency.  Safety protocols were in place, including screening questions prior to the visit, additional usage of staff PPE, and extensive cleaning of exam room while observing appropriate contact time as indicated for disinfecting solutions.

## 2021-03-02 NOTE — Patient Instructions (Addendum)
Recommend to proceed with a COVID booster  Weight loss: Consider going to the wellness clinic.  For pain: Tylenol  500 mg OTC 2 tabs a day every 8 hours as needed for pain If pain is severe, instead of Tylenol take hydrocodone Ice as needed   Check the  blood pressure regularly BP GOAL is between 110/65 and  135/85. If it is consistently higher or lower, let me know     GO TO THE LAB : Get the blood work     [place], [place] back for   checkup in 3 months

## 2021-03-03 ENCOUNTER — Encounter: Payer: Self-pay | Admitting: Internal Medicine

## 2021-03-03 LAB — COMPREHENSIVE METABOLIC PANEL
ALT: 25 U/L (ref 0–35)
AST: 24 U/L (ref 0–37)
Albumin: 4.2 g/dL (ref 3.5–5.2)
Alkaline Phosphatase: 92 U/L (ref 39–117)
BUN: 14 mg/dL (ref 6–23)
CO2: 27 mEq/L (ref 19–32)
Calcium: 9.3 mg/dL (ref 8.4–10.5)
Chloride: 104 mEq/L (ref 96–112)
Creatinine, Ser: 0.81 mg/dL (ref 0.40–1.20)
GFR: 72.6 mL/min (ref >60.00)
Glucose, Bld: 88 mg/dL (ref 70–99)
Potassium: 4.1 mEq/L (ref 3.5–5.1)
Sodium: 142 mEq/L (ref 135–145)
Total Bilirubin: 0.7 mg/dL (ref 0.2–1.2)
Total Protein: 7.4 g/dL (ref 6.0–8.3)

## 2021-03-03 LAB — CBC WITH DIFFERENTIAL/PLATELET
Basophils Absolute: 0.1 10*3/uL (ref 0.0–0.1)
Basophils Relative: 1.2 % (ref 0.0–3.0)
Eosinophils Absolute: 0.1 10*3/uL (ref 0.0–0.7)
Eosinophils Relative: 2.1 % (ref 0.0–5.0)
HCT: 33.7 % — ABNORMAL LOW (ref 36.0–46.0)
Hemoglobin: 11.1 g/dL — ABNORMAL LOW (ref 12.0–15.0)
Lymphocytes Relative: 31.8 % (ref 12.0–46.0)
Lymphs Abs: 1.6 10*3/uL (ref 0.7–4.0)
MCHC: 33 g/dL (ref 30.0–36.0)
MCV: 91.5 fl (ref 78.0–100.0)
Monocytes Absolute: 0.6 10*3/uL (ref 0.1–1.0)
Monocytes Relative: 11.3 % (ref 3.0–12.0)
Neutro Abs: 2.7 10*3/uL (ref 1.4–7.7)
Neutrophils Relative %: 53.6 % (ref 43.0–77.0)
Platelets: 256 10*3/uL (ref 150.0–400.0)
RBC: 3.68 Mil/uL — ABNORMAL LOW (ref 3.87–5.11)
RDW: 13.4 % (ref 11.5–15.5)
WBC: 5.1 10*3/uL (ref 4.0–10.5)

## 2021-03-03 LAB — TSH: TSH: 1.13 u[IU]/mL (ref 0.35–5.50)

## 2021-03-03 NOTE — Assessment & Plan Note (Signed)
Here for CPX HTN: BP today is very good, continue Zestoretic. Insomnia: On Ambien, contract signed. Hypothyroidism: On Synthroid, check labs DJD: Patient stopped meloxicam, it was ineffective.  She has multiple areas that hurt, denies persistent headache, fevers. Plan: Tylenol, hydrocodone 5 mg 1-2 tabs  daily if needed only.  Weight loss would also be extremely helpful.  See next. Morbid obesity: Diet exercise discussed, wellness clinic?  Info provided. RTC 3 months

## 2021-03-03 NOTE — Assessment & Plan Note (Signed)
--  Td 06-2017 - Pneumonia shot 2015 - Prevnar 06-22-15 -s/p Zostavax:2016 and shingrix x 2 -COVID vaccines: Booster recommended -Had a flu shot --[facility]: cscope ~2011, poor prep. Colonoscopy again 08/2015, no biopsies, Cscope 09-2020, next per GI. --Per guidelines  no further Pap smears --Mammogram 12-2020 ([facility]) --DEXA wnl 06-2015 - Labs: CMP, CBC, TSH -Advance care planning package of information provided -Diet and exercise discussed

## 2021-03-10 ENCOUNTER — Institutional Professional Consult (permissible substitution): Payer: Medicare HMO | Admitting: Plastic Surgery

## 2021-03-12 ENCOUNTER — Telehealth: Payer: Self-pay | Admitting: Internal Medicine

## 2021-03-12 NOTE — Telephone Encounter (Signed)
Requesting: Ambien 10mg   Contract: 03/02/2021 UDS: None Last Visit: 03/02/2021 Next Visit: 05/31/2021 Last Refill: 02/08/2021 #30 and 0RF  Please Advise

## 2021-03-12 NOTE — Telephone Encounter (Signed)
PDMP reviewed, she is prescribed a small amount of phentermine elsewhere.  Prescription sent.

## 2021-04-10 DIAGNOSIS — E663 Overweight: Secondary | ICD-10-CM | POA: Diagnosis not present

## 2021-04-10 DIAGNOSIS — R0602 Shortness of breath: Secondary | ICD-10-CM | POA: Diagnosis not present

## 2021-04-10 DIAGNOSIS — E559 Vitamin D deficiency, unspecified: Secondary | ICD-10-CM | POA: Diagnosis not present

## 2021-04-10 DIAGNOSIS — E538 Deficiency of other specified B group vitamins: Secondary | ICD-10-CM | POA: Diagnosis not present

## 2021-04-10 DIAGNOSIS — Z79899 Other long term (current) drug therapy: Secondary | ICD-10-CM | POA: Diagnosis not present

## 2021-04-10 DIAGNOSIS — Z6841 Body Mass Index (BMI) 40.0 and over, adult: Secondary | ICD-10-CM | POA: Diagnosis not present

## 2021-04-10 DIAGNOSIS — E8881 Metabolic syndrome: Secondary | ICD-10-CM | POA: Diagnosis not present

## 2021-05-10 ENCOUNTER — Encounter: Payer: Self-pay | Admitting: Neurology

## 2021-05-10 ENCOUNTER — Ambulatory Visit: Payer: Medicare HMO | Admitting: Neurology

## 2021-05-10 ENCOUNTER — Other Ambulatory Visit: Payer: Self-pay

## 2021-05-10 VITALS — BP 126/84 | HR 82 | Ht 63.0 in | Wt 248.0 lb

## 2021-05-10 DIAGNOSIS — M5417 Radiculopathy, lumbosacral region: Secondary | ICD-10-CM | POA: Diagnosis not present

## 2021-05-10 NOTE — Patient Instructions (Addendum)
Start physical therapy for right leg numbness  Return to clinic, if your symptoms get worse or do not improve

## 2021-05-10 NOTE — Progress Notes (Signed)
[facility] Clinic Note - Initial Visit   Date: 05/10/21  RAYLINN TRICE MRN: [id number] DOB: [birth year]   Dear Dr. Drue Novel:  Thank you for your kind referral of Kelly Perkins for consultation of right leg numbness. Although her history is well known to you, please allow [place] to reiterate it for the purpose of our medical record. The patient was accompanied to the clinic by self.   History of Present Illness: Kelly Perkins is a 71 y.o. right-handed female with hypertension, hypothyroidism, and hyperlipidemia presenting for evaluation of numbness/tingling.   Starting around the summer of 2022, she began having episodic numbness/tingling involving the sole of the right foot and lateral thigh.  She denies similar symptoms in the left leg or hands.  No weakness in the right leg or back pain.  Symptoms are more noticeable when walking and alleviated by rest.  It occurs about twice per week and lasts 5-10 minutes.   Out-side paper records, electronic medical record, and images have been reviewed where available and summarized as:  Lab Results  Component Value Date   HGBA1C 5.2 09/27/2019   Lab Results  Component Value Date   VITAMINB12 592 08/08/2010   Lab Results  Component Value Date   TSH 1.13 03/02/2021   Lab Results  Component Value Date   ESRSEDRATE 65 (H) 11/16/2016    Past Medical History:  Diagnosis Date   GERD (gastroesophageal reflux disease)    on meds   History of kidney stones    Hyperlipidemia 09/10/2010   on meds   Hypertension    on meds   Hypothyroidism    on meds   Normal cardiac stress test 08/2010   had CP-DOE   Osteoarthritis    generalizd   Wears dentures    top   Wears glasses     Past Surgical History:  Procedure Laterality Date   ABDOMINAL HYSTERECTOMY  2000   B oophoreectomy, d/t DUB   COLONOSCOPY  2017   JP-MAC-5 yr recall-poor prep-normal   CYSTOSCOPY/URETEROSCOPY/HOLMIUM LASER/STENT PLACEMENT Right 11/17/2017    Procedure: CYSTOSCOPY/RIGHT URETEROSCOPY/RIGHT HOLMIUM LASER/STENT PLACEMENT;  Surgeon: Rene Paci, MD;  Location: [facility];  Service: Urology;  Laterality: Right;   JOINT REPLACEMENT     LITHOTRIPSY  2004   urolthiasis    MASS EXCISION  09/12/2011   Procedure: EXCISION MASS;  Surgeon: Louisa Second, MD;  Location: [facility];  Service: Plastics;  Laterality: Left;  left ring finger   POLYPECTOMY     TA 2004   THYROIDECTOMY Left 2006   TOTAL KNEE ARTHROPLASTY Left 11/17/2016   Procedure: LEFT TOTAL KNEE ARTHROPLASTY;  Surgeon: Tarry Kos, MD;  Location: [facility];  Service: Orthopedics;  Laterality: Left;   TOTAL KNEE ARTHROPLASTY Right 12/04/2017   Procedure: RIGHT TOTAL KNEE ARTHROPLASTY;  Surgeon: Tarry Kos, MD;  Location: [facility];  Service: Orthopedics;  Laterality: Right;     Medications:  Outpatient Encounter Medications as of 05/10/2021  Medication Sig   levothyroxine (SYNTHROID) 137 MCG tablet Take 1 tablet (137 mcg total) by mouth daily before breakfast.   lisinopril-hydrochlorothiazide (ZESTORETIC) 10-12.5 MG tablet Take 1 tablet by mouth daily.   pantoprazole (PROTONIX) 40 MG tablet Take 1 tablet (40 mg total) by mouth daily.   simvastatin (ZOCOR) 20 MG tablet Take 1 tablet (20 mg total) by mouth at bedtime.   zolpidem (AMBIEN) 10 MG tablet TAKE 1 TABLET BY MOUTH AT BEDTIME AS NEEDED FOR SLEEP.   [  DISCONTINUED] HYDROcodone-acetaminophen (NORCO/VICODIN) 5-325 MG tablet Take 1-2 tablets by mouth daily as needed. (Patient not taking: Reported on 05/10/2021)   No facility-administered encounter medications on file as of 05/10/2021.    Allergies: No Known Allergies  Family History: Family History  Problem Relation Age of Onset   Hypertension Mother    Coronary artery disease Mother        onset?   Hypertension Sister    Diabetes Neg Hx    Colon cancer Neg Hx    Breast cancer Neg Hx    Stroke Neg Hx    Rectal cancer Neg Hx    Stomach  cancer Neg Hx    Colon polyps Neg Hx     Social History: Social History   Tobacco Use   Smoking status: Never   Smokeless tobacco: Never  Vaping Use   Vaping Use: Never used  Substance Use Topics   Alcohol use: Not Currently    Alcohol/week: 1.0 standard drink    Types: 1 Standard drinks or equivalent per week   Drug use: Never   Social History   Social History Narrative   Single, mom is one of my patients [name].     lives by herself      Right Handed    Lives in a two story home    Vital Signs:  BP 126/84    Pulse 82    Ht 5\' 3"  (1.6 m)    Wt 248 lb (112.5 kg)    SpO2 98%    BMI 43.93 kg/m    Neurological Exam: MENTAL STATUS including orientation to time, place, person, recent and remote memory, attention span and concentration, language, and fund of knowledge is normal.  Speech is not dysarthric.  CRANIAL NERVES: II:  No visual field defects.    III-IV-VI: Pupils equal round and reactive to light.  Normal conjugate, extra-ocular eye movements in all directions of gaze.  No nystagmus.  No ptosis.   V:  Normal facial sensation.    VII:  Normal facial symmetry and movements.   VIII:  Normal hearing and vestibular function.   IX-X:  Normal palatal movement.   XI:  Normal shoulder shrug and head rotation.   XII:  Normal tongue strength and range of motion, no deviation or fasciculation.  MOTOR:  No atrophy, fasciculations or abnormal movements.  No pronator drift.   Upper Extremity:  Right  Left  Deltoid  5/5   5/5   Biceps  5/5   5/5   Triceps  5/5   5/5   Wrist extensors  5/5   5/5   Wrist flexors  5/5   5/5   Finger extensors  5/5   5/5   Finger flexors  5/5   5/5   Dorsal interossei  5/5   5/5   Abductor pollicis  5/5   5/5   Tone (Ashworth scale)  0  0   Lower Extremity:  Right  Left  Hip flexors  5/5   5/5   Knee flexors  5/5   5/5   Knee extensors  5/5   5/5   Dorsiflexors  5/5   5/5   Plantarflexors  5/5   5/5   Toe extensors  5/5   5/5    Toe flexors  5/5   5/5   Tone (Ashworth scale)  0  0   MSRs:  Right        Left  brachioradialis 2+  2+  biceps 2+  2+  triceps 2+  2+  patellar 2+  2+  ankle jerk 2+  2+  Hoffman no  no  plantar response down  down   SENSORY:  Normal and symmetric perception of light touch, pinprick, vibration, and proprioception.  Romberg's sign absent.   COORDINATION/GAIT: Normal finger-to- nose-finger and heel-to-shin.  Intact rapid alternating movements bilaterally.   Gait narrow based and stable. Tandem and stressed gait intact.    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  Symptoms are not suggestive of neuropathy.  Given that her paresthesias are very episodic, there is no role for medications such as gabapentin.  I recommend she start PT for low back strengthening.  If symptoms do not improve, MRI lumbar spine will be the next step.    Thank you for allowing me to participate in patient's care.  If I can answer any additional questions, I would be pleased to do so.    Sincerely,    Shelvia Fojtik K. Allena Katz, DO

## 2021-05-12 ENCOUNTER — Other Ambulatory Visit: Payer: Self-pay | Admitting: Internal Medicine

## 2021-05-22 DIAGNOSIS — E663 Overweight: Secondary | ICD-10-CM | POA: Diagnosis not present

## 2021-05-22 DIAGNOSIS — E8881 Metabolic syndrome: Secondary | ICD-10-CM | POA: Diagnosis not present

## 2021-05-22 DIAGNOSIS — E538 Deficiency of other specified B group vitamins: Secondary | ICD-10-CM | POA: Diagnosis not present

## 2021-05-22 DIAGNOSIS — E559 Vitamin D deficiency, unspecified: Secondary | ICD-10-CM | POA: Diagnosis not present

## 2021-05-22 DIAGNOSIS — Z6841 Body Mass Index (BMI) 40.0 and over, adult: Secondary | ICD-10-CM | POA: Diagnosis not present

## 2021-05-22 DIAGNOSIS — E611 Iron deficiency: Secondary | ICD-10-CM | POA: Diagnosis not present

## 2021-05-31 ENCOUNTER — Ambulatory Visit: Payer: Medicare HMO | Admitting: Internal Medicine

## 2021-06-11 ENCOUNTER — Ambulatory Visit: Payer: Medicare HMO | Admitting: Internal Medicine

## 2021-06-13 ENCOUNTER — Other Ambulatory Visit: Payer: Self-pay | Admitting: Internal Medicine

## 2021-06-16 ENCOUNTER — Ambulatory Visit: Payer: Medicare HMO | Admitting: Internal Medicine

## 2021-06-23 ENCOUNTER — Ambulatory Visit (INDEPENDENT_AMBULATORY_CARE_PROVIDER_SITE_OTHER): Payer: Medicare HMO | Admitting: Internal Medicine

## 2021-06-23 ENCOUNTER — Encounter: Payer: Self-pay | Admitting: Internal Medicine

## 2021-06-23 VITALS — BP 136/80 | HR 75 | Temp 97.7°F | Resp 18 | Ht 63.0 in | Wt 248.4 lb

## 2021-06-23 DIAGNOSIS — E785 Hyperlipidemia, unspecified: Secondary | ICD-10-CM

## 2021-06-23 DIAGNOSIS — E039 Hypothyroidism, unspecified: Secondary | ICD-10-CM | POA: Diagnosis not present

## 2021-06-23 DIAGNOSIS — G4700 Insomnia, unspecified: Secondary | ICD-10-CM | POA: Diagnosis not present

## 2021-06-23 LAB — LIPID PANEL
Cholesterol: 157 mg/dL (ref 0–200)
HDL: 45.8 mg/dL (ref >39.00)
LDL Cholesterol: 94 mg/dL (ref 0–99)
NonHDL: 111.69
Total CHOL/HDL Ratio: 3
Triglycerides: 86 mg/dL (ref 0.0–149.0)
VLDL: 17.2 mg/dL (ref 0.0–40.0)

## 2021-06-23 LAB — TSH: TSH: 0.67 u[IU]/mL (ref 0.35–5.50)

## 2021-06-23 NOTE — Progress Notes (Signed)
? ?Subjective:  ? ? Patient ID: Kelly Perkins, female    DOB: [birth year], 71 y.o.   MRN: [id number] ? ?DOS:  06/23/2021 ?Type of visit - description: f/u ? ?Since the last office visit is doing well. ?She had to stop hydrocodone because she developed some itching and also a ill-defined rash. ?Today we talk about her chronic medical issues. ? ?Review of Systems ?See above  ? ?Past Medical History:  ?Diagnosis Date  ? GERD (gastroesophageal reflux disease)   ? on meds  ? History of kidney stones   ? Hyperlipidemia 09/10/2010  ? on meds  ? Hypertension   ? on meds  ? Hypothyroidism   ? on meds  ? Normal cardiac stress test 08/2010  ? had CP-DOE  ? Osteoarthritis   ? generalizd  ? Wears dentures   ? top  ? Wears glasses   ? ? ?Past Surgical History:  ?Procedure Laterality Date  ? ABDOMINAL HYSTERECTOMY  2000  ? B oophoreectomy, d/t DUB  ? COLONOSCOPY  2017  ? [facility]-MAC-5 yr recall-poor prep-normal  ? CYSTOSCOPY/URETEROSCOPY/HOLMIUM LASER/STENT PLACEMENT Right 11/17/2017  ? Procedure: CYSTOSCOPY/RIGHT URETEROSCOPY/RIGHT HOLMIUM LASER/STENT PLACEMENT;  Surgeon: Ceasar Mons, MD;  Location: [facility];  Service: Urology;  Laterality: Right;  ? JOINT REPLACEMENT    ? LITHOTRIPSY  2004  ? urolthiasis   ? MASS EXCISION  09/12/2011  ? Procedure: EXCISION MASS;  Surgeon: Cristine Polio, MD;  Location: [facility];  Service: Plastics;  Laterality: Left;  left ring finger  ? POLYPECTOMY    ? TA 2004  ? THYROIDECTOMY Left 2006  ? TOTAL KNEE ARTHROPLASTY Left 11/17/2016  ? Procedure: LEFT TOTAL KNEE ARTHROPLASTY;  Surgeon: Leandrew Koyanagi, MD;  Location: [facility];  Service: Orthopedics;  Laterality: Left;  ? TOTAL KNEE ARTHROPLASTY Right 12/04/2017  ? Procedure: RIGHT TOTAL KNEE ARTHROPLASTY;  Surgeon: Leandrew Koyanagi, MD;  Location: [facility];  Service: Orthopedics;  Laterality: Right;  ? ? ?Current Outpatient Medications  ?Medication Instructions  ? levothyroxine (SYNTHROID) 137 MCG tablet TAKE 1 TABLET BY MOUTH DAILY  BEFORE BREAKFAST.  ? lisinopril-hydrochlorothiazide (ZESTORETIC) 10-12.5 MG tablet 1 tablet, Oral, Daily  ? pantoprazole (PROTONIX) 40 mg, Oral, Daily  ? simvastatin (ZOCOR) 20 mg, Oral, Daily at bedtime  ? zolpidem (AMBIEN) 10 MG tablet TAKE 1 TABLET BY MOUTH AT BEDTIME AS NEEDED FOR SLEEP.  ? ? ?   ?Objective:  ? Physical Exam ?BP 136/80 (BP Location: Left Arm, Patient Position: Sitting, Cuff Size: Normal)   Pulse 75   Temp 97.7 ?F (36.5 ?C) (Oral)   Resp 18   Ht _0  (1.6 m)   Wt 248 lb 6 oz (112.7 kg)   SpO2 99%   BMI 44.00 kg/m?  ?General:   ?Well developed, NAD, BMI noted. ?HEENT:  ?Normocephalic . Face symmetric, atraumatic ?Lungs:  ?CTA B ?Normal respiratory effort, no intercostal retractions, no accessory muscle use. ?Heart: RRR,  no murmur.  ?Lower extremities: no pretibial edema bilaterally  ?Skin: Not pale. Not jaundice ?Neurologic:  ?alert & oriented X3.  ?Speech normal, gait appropriate for age and unassisted ?Psych--  ?Cognition and judgment appear intact.  ?Cooperative with normal attention span and concentration.  ?Behavior appropriate. ?No anxious or depressed appearing.  ? ?   ?Assessment   ? ? ASSESSMENT ?HTN ?Hyperlipidemia ?Insomnia-- Lorrin Mais ?Hypothyroidism, s/p thyroidectomy ?Morbid obesity ?GERD ?Mild anemia, normal iron 12-2013, cscope 08-2015 ?DJD  ?h/o kidney stones  ?CP-DOE: Normal stress test 08/2010 ?  ?PLAN: ?  HTN: Looks very good, continue Zestoretic, last BMP satisfactory.  No change ?Hyperlipidemia: On simvastatin, check FLP. ?Insomnia: Well-controlled with Ambien, RF as needed. ?Hypothyroidism: On Synthroid, check TSH. ?Preventive care: Immunizations reviewed, recommend a COVID-vaccine booster.   ?RTC CPX 8 months ?  ?This visit occurred during the SARS-CoV-2 public health emergency.  Safety protocols were in place, including screening questions prior to the visit, additional usage of staff PPE, and extensive cleaning of exam room while observing appropriate contact time as  indicated for disinfecting solutions.  ? ?

## 2021-06-23 NOTE — Patient Instructions (Addendum)
Recommend to get the COVID-vaccine booster at your convenience ? ?Check the  blood pressure regularly ?BP GOAL is between 110/65 and  135/85. ?If it is consistently higher or lower, let me know ? ? ? ?GO TO THE LAB : Get the blood work   ? ? ?[place], [place] ?Come back for a physical exam by December 2023 ?

## 2021-06-23 NOTE — Assessment & Plan Note (Signed)
HTN: Looks very good, continue Zestoretic, last BMP satisfactory.  No change ?Hyperlipidemia: On simvastatin, check FLP. ?Insomnia: Well-controlled with Ambien, RF as needed. ?Hypothyroidism: On Synthroid, check TSH. ?Preventive care: Immunizations reviewed, recommend a COVID-vaccine booster.   ?RTC CPX 8 months ?

## 2021-07-12 ENCOUNTER — Telehealth: Payer: Self-pay | Admitting: Internal Medicine

## 2021-07-13 NOTE — Telephone Encounter (Signed)
Requesting: Ambien '10mg'$   ?Contract: 03/02/2021 ?UDS: None ?Last Visit: 06/23/21 ?Next Visit: 03/15/22 ?Last Refill: 03/12/2021 #30 and 3RF ? ?Please Advise ? ? ?Also- refill request for flexeril- no longer on med list. Please advise?  ?

## 2021-07-13 NOTE — Telephone Encounter (Signed)
PDMP review, Ambien and Flexeril refilled. ?

## 2021-07-27 ENCOUNTER — Telehealth: Payer: Self-pay

## 2021-07-27 NOTE — Telephone Encounter (Signed)
Called pt to schedule awv, lvm to call back. ?

## 2021-08-04 ENCOUNTER — Ambulatory Visit (INDEPENDENT_AMBULATORY_CARE_PROVIDER_SITE_OTHER): Payer: Medicare HMO

## 2021-08-04 DIAGNOSIS — Z780 Asymptomatic menopausal state: Secondary | ICD-10-CM | POA: Diagnosis not present

## 2021-08-04 DIAGNOSIS — Z0000 Encounter for general adult medical examination without abnormal findings: Secondary | ICD-10-CM

## 2021-08-04 NOTE — Patient Instructions (Signed)
Kelly Perkins , Thank you for taking time to come for your Medicare Wellness Visit. I appreciate your ongoing commitment to your health goals. Please review the following plan we discussed and let me know if I can assist you in the future.   Screening recommendations/referrals: Colonoscopy: 10/01/20 due 10/01/25 Mammogram: 12/16/20 due 12/16/21 Bone Density: ordered 08/04/21 Recommended yearly ophthalmology/optometry visit for glaucoma screening and checkup Recommended yearly dental visit for hygiene and checkup  Vaccinations: Influenza vaccine: up to date Pneumococcal vaccine: up to date Tdap vaccine: up to date Shingles vaccine: up to date   Covid-19:Due-May obtain vaccine at your local pharmacy.   Advanced directives: no, packet sent  Conditions/risks identified: see problem list  Next appointment: Follow up in one year for your annual wellness visit 08/08/21   Preventive Care 65 Years and Older, Female Preventive care refers to lifestyle choices and visits with your health care provider that can promote health and wellness. What does preventive care include? A yearly physical exam. This is also called an annual well check. Dental exams once or twice a year. Routine eye exams. Ask your health care provider how often you should have your eyes checked. Personal lifestyle choices, including: Daily care of your teeth and gums. Regular physical activity. Eating a healthy diet. Avoiding tobacco and drug use. Limiting alcohol use. Practicing safe sex. Taking low-dose aspirin every day. Taking vitamin and mineral supplements as recommended by your health care provider. What happens during an annual well check? The services and screenings done by your health care provider during your annual well check will depend on your age, overall health, lifestyle risk factors, and family history of disease. Counseling  Your health care provider may ask you questions about your: Alcohol use. Tobacco  use. Drug use. Emotional well-being. Home and relationship well-being. Sexual activity. Eating habits. History of falls. Memory and ability to understand (cognition). Work and work Statistician. Reproductive health. Screening  You may have the following tests or measurements: Height, weight, and BMI. Blood pressure. Lipid and cholesterol levels. These may be checked every 5 years, or more frequently if you are over 25 years old. Skin check. Lung cancer screening. You may have this screening every year starting at age 71 if you have a 30-pack-year history of smoking and currently smoke or have quit within the past 15 years. Fecal occult blood test (FOBT) of the stool. You may have this test every year starting at age 2. Flexible sigmoidoscopy or colonoscopy. You may have a sigmoidoscopy every 5 years or a colonoscopy every 10 years starting at age 19. Hepatitis C blood test. Hepatitis B blood test. Sexually transmitted disease (STD) testing. Diabetes screening. This is done by checking your blood sugar (glucose) after you have not eaten for a while (fasting). You may have this done every 1-3 years. Bone density scan. This is done to screen for osteoporosis. You may have this done starting at age 77. Mammogram. This may be done every 1-2 years. Talk to your health care provider about how often you should have regular mammograms. Talk with your health care provider about your test results, treatment options, and if necessary, the need for more tests. Vaccines  Your health care provider may recommend certain vaccines, such as: Influenza vaccine. This is recommended every year. Tetanus, diphtheria, and acellular pertussis (Tdap, Td) vaccine. You may need a Td booster every 10 years. Zoster vaccine. You may need this after age 23. Pneumococcal 13-valent conjugate (PCV13) vaccine. One dose is recommended after age  65. Pneumococcal polysaccharide (PPSV23) vaccine. One dose is recommended  after age 44. Talk to your health care provider about which screenings and vaccines you need and how often you need them. This information is not intended to replace advice given to you by your health care provider. Make sure you discuss any questions you have with your health care provider. Document Released: 03/27/2015 Document Revised: 11/18/2015 Document Reviewed: 12/30/2014 Elsevier Interactive Patient Education  2017 [employer] Prevention in the Home Falls can cause injuries. They can happen to people of all ages. There are many things you can do to make your home safe and to help prevent falls. What can I do on the outside of my home? Regularly fix the edges of walkways and driveways and fix any cracks. Remove anything that might make you trip as you walk through a door, such as a raised step or threshold. Trim any bushes or trees on the path to your home. Use bright outdoor lighting. Clear any walking paths of anything that might make someone trip, such as rocks or tools. Regularly check to see if handrails are loose or broken. Make sure that both sides of any steps have handrails. Any raised decks and porches should have guardrails on the edges. Have any leaves, snow, or ice cleared regularly. Use sand or salt on walking paths during winter. Clean up any spills in your garage right away. This includes oil or grease spills. What can I do in the bathroom? Use night lights. Install grab bars by the toilet and in the tub and shower. Do not use towel bars as grab bars. Use non-skid mats or decals in the tub or shower. If you need to sit down in the shower, use a plastic, non-slip stool. Keep the floor dry. Clean up any water that spills on the floor as soon as it happens. Remove soap buildup in the tub or shower regularly. Attach bath mats securely with double-sided non-slip rug tape. Do not have throw rugs and other things on the floor that can make you trip. What can I do  in the bedroom? Use night lights. Make sure that you have a light by your bed that is easy to reach. Do not use any sheets or blankets that are too big for your bed. They should not hang down onto the floor. Have a firm chair that has side arms. You can use this for support while you get dressed. Do not have throw rugs and other things on the floor that can make you trip. What can I do in the kitchen? Clean up any spills right away. Avoid walking on wet floors. Keep items that you use a lot in easy-to-reach places. If you need to reach something above you, use a strong step stool that has a grab bar. Keep electrical cords out of the way. Do not use floor polish or wax that makes floors slippery. If you must use wax, use non-skid floor wax. Do not have throw rugs and other things on the floor that can make you trip. What can I do with my stairs? Do not leave any items on the stairs. Make sure that there are handrails on both sides of the stairs and use them. Fix handrails that are broken or loose. Make sure that handrails are as long as the stairways. Check any carpeting to make sure that it is firmly attached to the stairs. Fix any carpet that is loose or worn. Avoid having throw rugs at  the top or bottom of the stairs. If you do have throw rugs, attach them to the floor with carpet tape. Make sure that you have a light switch at the top of the stairs and the bottom of the stairs. If you do not have them, ask someone to add them for you. What else can I do to help prevent falls? Wear shoes that: Do not have high heels. Have rubber bottoms. Are comfortable and fit you well. Are closed at the toe. Do not wear sandals. If you use a stepladder: Make sure that it is fully opened. Do not climb a closed stepladder. Make sure that both sides of the stepladder are locked into place. Ask someone to hold it for you, if possible. Clearly mark and make sure that you can see: Any grab bars or  handrails. First and last steps. Where the edge of each step is. Use tools that help you move around (mobility aids) if they are needed. These include: Canes. Walkers. Scooters. Crutches. Turn on the lights when you go into a dark area. Replace any light bulbs as soon as they burn out. Set up your furniture so you have a clear path. Avoid moving your furniture around. If any of your floors are uneven, fix them. If there are any pets around you, be aware of where they are. Review your medicines with your doctor. Some medicines can make you feel dizzy. This can increase your chance of falling. Ask your doctor what other things that you can do to help prevent falls. This information is not intended to replace advice given to you by your health care provider. Make sure you discuss any questions you have with your health care provider. Document Released: 12/25/2008 Document Revised: 08/06/2015 Document Reviewed: 04/04/2014 Elsevier Interactive Patient Education  2017 [employer].

## 2021-08-04 NOTE — Progress Notes (Addendum)
Subjective:   Kelly Perkins is a 71 y.o. female who presents for Medicare Annual (Subsequent) preventive examination.  I connected with  Melburn Popper on 08/04/21 by a audio enabled telemedicine application and verified that I am speaking with the correct person using two identifiers.  Patient Location: Home  Provider Location: Office/Clinic  I discussed the limitations of evaluation and management by telemedicine. The patient expressed understanding and agreed to proceed.   Review of Systems     Cardiac Risk Factors include: advanced age (>28mn, >>70women);hypertension;dyslipidemia     Objective:    There were no vitals filed for this visit. There is no height or weight on file to calculate BMI.     08/04/2021   11:03 AM 05/10/2021   11:18 AM 12/04/2017    3:11 PM 12/04/2017    8:11 AM 11/23/2017    9:24 AM 11/17/2017    4:03 PM 11/16/2017   11:48 PM  Advanced Directives  Does Patient Have a Medical Advance Directive? No No  No No No No  Would patient like information on creating a medical advance directive? No - Patient declined  Yes (Inpatient - patient defers creating a medical advance directive at this time) No - Patient declined No - Patient declined No - Patient declined     Current Medications (verified) Outpatient Encounter Medications as of 08/04/2021  Medication Sig   cyclobenzaprine (FLEXERIL) 10 MG tablet TAKE 1 TABLET BY MOUTH AT BEDTIME AS NEEDED FOR MUSCLE SPASMS   levothyroxine (SYNTHROID) 137 MCG tablet TAKE 1 TABLET BY MOUTH DAILY BEFORE BREAKFAST.   lisinopril-hydrochlorothiazide (ZESTORETIC) 10-12.5 MG tablet Take 1 tablet by mouth daily.   pantoprazole (PROTONIX) 40 MG tablet Take 1 tablet (40 mg total) by mouth daily.   simvastatin (ZOCOR) 20 MG tablet Take 1 tablet (20 mg total) by mouth at bedtime.   zolpidem (AMBIEN) 10 MG tablet TAKE 1 TABLET BY MOUTH AT BEDTIME AS NEEDED FOR SLEEP.   No facility-administered encounter medications on file as of  08/04/2021.    Allergies (verified) Hydrocodone bitartrate er   History: Past Medical History:  Diagnosis Date   GERD (gastroesophageal reflux disease)    on meds   History of kidney stones    Hyperlipidemia 09/10/2010   on meds   Hypertension    on meds   Hypothyroidism    on meds   Normal cardiac stress test 08/2010   had CP-DOE   Osteoarthritis    generalizd   Wears dentures    top   Wears glasses    Past Surgical History:  Procedure Laterality Date   ABDOMINAL HYSTERECTOMY  2000   B oophoreectomy, d/t DUB   COLONOSCOPY  2017   JP-MAC-5 yr recall-poor prep-normal   CYSTOSCOPY/URETEROSCOPY/HOLMIUM LASER/STENT PLACEMENT Right 11/17/2017   Procedure: CYSTOSCOPY/RIGHT URETEROSCOPY/RIGHT HOLMIUM LASER/STENT PLACEMENT;  Surgeon: WCeasar Mons MD;  Location: [facility];  Service: Urology;  Laterality: Right;   JOINT REPLACEMENT     LITHOTRIPSY  2004   urolthiasis    MASS EXCISION  09/12/2011   Procedure: EXCISION MASS;  Surgeon: GCristine Polio MD;  [facility] [facility]  Service: Plastics;  Laterality: Left;  left ring finger   POLYPECTOMY     TA 2004   THYROIDECTOMY Left 2006   TOTAL KNEE ARTHROPLASTY Left 11/17/2016   Procedure: LEFT TOTAL KNEE ARTHROPLASTY;  Surgeon: XLeandrew Koyanagi MD;  [place] [facility]  Service: Orthopedics;  Laterality: Left;   TOTAL KNEE ARTHROPLASTY Right 12/04/2017  Procedure: RIGHT TOTAL KNEE ARTHROPLASTY;  Surgeon: Leandrew Koyanagi, MD;  Location: [place];  Service: Orthopedics;  Laterality: Right;   Family History  Problem Relation Age of Onset   Hypertension Mother    Coronary artery disease Mother        onset?   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.     lives by herself      Right Handed    Lives in a two story home   Social Determinants of Health   Financial Resource Strain: Low Risk    Difficulty of Paying Living Expenses: Not hard at all  Food Insecurity: No Food Insecurity   Worried About Charity fundraiser in the Last Year: Never true   Arboriculturist in the Last Year: Never true  Transportation Needs: No Transportation Needs   Lack of Transportation (Medical): No   Lack of Transportation (Non-Medical): No  Physical Activity: Sufficiently Active   Days of Exercise per Week: 7 days   Minutes of Exercise per Session: 30 min  Stress: No Stress Concern Present   Feeling of Stress : Not at all  Social Connections: Moderately Isolated   Frequency of Communication with Friends and Family: More than three times a week   Frequency of Social Gatherings with Friends and Family: Once a week   Attends Religious Services: More than 4 times per year   Active Member of [employer] or Organizations: No   Attends Music therapist: Never   Marital Status: Divorced    Tobacco Counseling Counseling given: Not Answered   Clinical Intake:  Pre-visit preparation completed: Yes  Pain : No/denies pain     Nutritional Risks: None Diabetes: No  How often do you need to have someone help you when you read instructions, pamphlets, or other written materials from your  doctor or pharmacy?: 1 - Never  Diabetic?No  Interpreter Needed?: No  Information entered by :: [place] of Daily Living    08/04/2021   11:05 AM 03/02/2021    1:02 PM  In your present state of health, do you have any difficulty performing the following activities:  Hearing? 0 0  Vision? 0 0  Difficulty concentrating or making decisions? 0 0  Walking or climbing stairs? 0 0  Dressing or bathing? 0 0  Doing errands, shopping? 0 0  Preparing Food and eating ? N  Using the Toilet? N   In the past six months, have you accidently leaked urine? N   Do you have problems with loss of bowel control? N   Managing your Medications? N   Managing your Finances? N   Housekeeping or managing your Housekeeping? N     Patient Care Team: Colon Branch, MD as PCP - General Lovena Neighbours Conception Oms, MD as Consulting Physician (Urology) Leandrew Koyanagi, MD as Attending Physician (Orthopedic Surgery) Alda Berthold, DO as Consulting Physician (Neurology)  Indicate any recent Medical Services you may have received from other than [facility] providers in the past year (date may be approximate).     Assessment:   This is a routine wellness examination for [facility].  Hearing/Vision screen No results found.  Dietary issues and exercise activities discussed: Current Exercise Habits: Home exercise routine, Type of exercise: Other - see comments, Time (Minutes): 30, Frequency (Times/Week): 7, Weekly Exercise (Minutes/Week): 210, Intensity: Mild, Exercise limited by: None identified   Goals Addressed   None    Depression Screen    08/04/2021   11:03 AM 06/23/2021    1:11 PM 03/02/2021    1:00 PM 09/29/2020    2:36 PM 03/31/2020   12:23 PM 09/27/2019    1:33 PM 02/20/2018    1:50 PM  PHQ 2/9 Scores  PHQ - 2 Score 0 0 0 0 0 0 0    Fall Risk    08/04/2021   11:03 AM 06/23/2021    1:11 PM 05/10/2021   11:17 AM 03/02/2021    1:00 PM 09/29/2020    2:36 PM  [place] in the  past year? 0 0 0 0 0  Number falls in past yr: 0 0 0 0 0  Injury with Fall? 0 0 0 0 0  Risk for fall due to : No Fall Risks      Follow up Falls evaluation completed Falls evaluation completed  Falls evaluation completed     [place]:  Any stairs in or around the home? Yes  If so, are there any without handrails? No  Home free of loose throw rugs in walkways, pet beds, electrical cords, etc? Yes  Adequate lighting in your home to reduce risk of falls? Yes   ASSISTIVE DEVICES UTILIZED TO PREVENT FALLS:  Life alert? No  Use of a cane, walker or w/c? No  Grab bars in the bathroom? Yes  Shower chair or bench in shower? No  Elevated toilet seat or a handicapped toilet? Yes   TIMED UP AND GO:  Was the test performed? No .    Cognitive Function:        08/04/2021   11:07 AM  6CIT Screen  What Year? 0 points  What month? 0 points  What time? 0 points  Count back from 20 4 points  Months in reverse 0 points  Repeat phrase 2 points  Total Score 6 points    Immunizations Immunization History  Administered Date(s) Administered   Fluad Quad(high Dose 65+) 11/16/2018   Influenza Split 12/23/2013, 11/06/2014   Influenza, High Dose Seasonal PF 12/06/2017, 12/16/2020   Influenza-Unspecified 12/11/2015, 11/09/2016, 02/04/2020   Moderna Sars-Covid-2 Vaccination 04/12/2019, 05/10/2019, 01/18/2020   Pneumococcal Conjugate-13 06/22/2015   Pneumococcal Polysaccharide-23 01/03/2014, 11/16/2018   Td 04/13/2007, 07/03/2017   Zoster Recombinat (Shingrix) 02/14/2019, 12/16/2020   Zoster, Live 08/25/2014    TDAP status: Up to date  Flu Vaccine status: Up to  date  Pneumococcal vaccine status: Up to date  Covid-19 vaccine status: Information provided on how to obtain vaccines.   Qualifies for Shingles Vaccine? Yes   Zostavax completed No   Shingrix Completed?: Yes  Screening Tests Health Maintenance  Topic Date Due   COVID-19 Vaccine (4 -  Booster for Moderna series) 03/14/2020   INFLUENZA VACCINE  10/12/2021   MAMMOGRAM  12/16/2021   COLONOSCOPY (Pts 45-59yr Insurance coverage will need to be confirmed)  10/01/2025   TETANUS/TDAP  07/04/2027   Pneumonia Vaccine 71 Years old  Completed   DEXA SCAN  Completed   Hepatitis C Screening  Completed   Zoster Vaccines- Shingrix  Completed   HPV VACCINES  Aged Out    Health Maintenance  Health Maintenance Due  Topic Date Due   COVID-19 Vaccine (4 - Booster for Moderna series) 03/14/2020    Colorectal cancer screening: Type of screening: Colonoscopy. Completed 10/01/20. Repeat every 5 years  Mammogram status: Completed 12/16/20. Repeat every year  Bone Density status: Ordered 08/04/21. Pt provided with contact info and advised to call to schedule appt.  Lung Cancer Screening: (Low Dose CT Chest recommended if Age 73-80years, 30 pack-year currently smoking OR have quit w/in 15years.) does not qualify.   Lung Cancer Screening Referral: N/A  Additional Screening:  Hepatitis C Screening: does qualify; Completed 06/24/16  Vision Screening: Recommended annual ophthalmology exams for early detection of glaucoma and other disorders of the eye. Is the patient up to date with their annual eye exam?  No  Who is the provider or what is the name of the office in which the patient attends annual eye exams? [facility] If pt is not established with a provider, would they like to be referred to a provider to establish care? No .   Dental Screening: Recommended annual dental exams for proper oral hygiene  Community Resource Referral / Chronic Care Management: CRR required this visit?  No   CCM required this visit?  No      Plan:     I have personally reviewed and noted the following in the patient's chart:   Medical and social history Use of alcohol, tobacco or illicit drugs  Current medications and supplements including opioid prescriptions.  Functional ability and  status Nutritional status Physical activity Advanced directives List of other physicians Hospitalizations, surgeries, and ER visits in previous 12 months Vitals Screenings to include cognitive, depression, and falls Referrals and appointments  In addition, I have reviewed and discussed with patient certain preventive protocols, quality metrics, and best practice recommendations. A written personalized care plan for preventive services as well as general preventive health recommendations were provided to patient.   Due to this being a telephonic visit, the after visit summary with patients personalized plan was offered to patient via mail or my-chart.  per request, patient was mailed a copy of AVS.    SDuard BradyChism, [place]  08/04/2021   Nurse Notes: None   I have reviewed and agree with [facility] documentation.  JKathlene November MD

## 2021-08-31 ENCOUNTER — Other Ambulatory Visit: Payer: Self-pay | Admitting: Internal Medicine

## 2021-09-10 ENCOUNTER — Other Ambulatory Visit: Payer: Self-pay | Admitting: Internal Medicine

## 2021-09-10 DIAGNOSIS — K219 Gastro-esophageal reflux disease without esophagitis: Secondary | ICD-10-CM

## 2021-10-04 DIAGNOSIS — H2513 Age-related nuclear cataract, bilateral: Secondary | ICD-10-CM | POA: Diagnosis not present

## 2021-10-08 ENCOUNTER — Encounter: Payer: Self-pay | Admitting: Internal Medicine

## 2021-10-08 ENCOUNTER — Ambulatory Visit (INDEPENDENT_AMBULATORY_CARE_PROVIDER_SITE_OTHER): Payer: Medicare HMO | Admitting: Internal Medicine

## 2021-10-08 VITALS — BP 126/80 | HR 67 | Temp 98.0°F | Resp 18 | Ht 63.0 in | Wt 254.2 lb

## 2021-10-08 DIAGNOSIS — K219 Gastro-esophageal reflux disease without esophagitis: Secondary | ICD-10-CM | POA: Diagnosis not present

## 2021-10-08 DIAGNOSIS — F4321 Adjustment disorder with depressed mood: Secondary | ICD-10-CM | POA: Diagnosis not present

## 2021-10-08 MED ORDER — DICYCLOMINE HCL 10 MG PO CAPS: 10.0000 mg | ORAL_CAPSULE | Freq: Three times a day (TID) | ORAL | 0 refills | Status: DC

## 2021-10-08 NOTE — Progress Notes (Unsigned)
Subjective:    Patient ID: Kelly Perkins, female    DOB: 1951-02-02, 71 y.o.   MRN: [id number]  DOS:  10/08/2021 Type of visit - description: Acute Symptoms Started 3 weeks ago: Burping frequently, denies heartburn or abdominal pain. No change in the color of the stools No diarrhea, fever or chills Admits that she is eating larger portions lately  Review of Systems See above   Past Medical History:  Diagnosis Date   GERD (gastroesophageal reflux disease)    on meds   History of kidney stones    Hyperlipidemia 09/10/2010   on meds   Hypertension    on meds   Hypothyroidism    on meds   Normal cardiac stress test 08/2010   had CP-DOE   Osteoarthritis    generalizd   Wears dentures    top   Wears glasses     Past Surgical History:  Procedure Laterality Date   ABDOMINAL HYSTERECTOMY  2000   B oophoreectomy, d/t DUB   COLONOSCOPY  2017   JP-MAC-5 yr recall-poor prep-normal   CYSTOSCOPY/URETEROSCOPY/HOLMIUM LASER/STENT PLACEMENT Right 11/17/2017   Procedure: CYSTOSCOPY/RIGHT URETEROSCOPY/RIGHT HOLMIUM LASER/STENT PLACEMENT;  Surgeon: Ceasar Mons, MD;  [facility];  Service: Urology;  Laterality: Right;   JOINT REPLACEMENT     LITHOTRIPSY  2004   urolthiasis    MASS EXCISION  09/12/2011   Procedure: EXCISION MASS;  Surgeon: Cristine Polio, MD;  Location: [place];  Service: Plastics;  Laterality: Left;  left ring finger   POLYPECTOMY     TA 2004   THYROIDECTOMY Left 2006   TOTAL KNEE ARTHROPLASTY Left 11/17/2016   Procedure: LEFT TOTAL KNEE ARTHROPLASTY;  Surgeon: Leandrew Koyanagi, MD;  Location: [place];  Service: Orthopedics;  Laterality: Left;   TOTAL KNEE ARTHROPLASTY Right 12/04/2017   Procedure: RIGHT TOTAL KNEE ARTHROPLASTY;  Surgeon: Leandrew Koyanagi, MD;  Location: [place];  Service: Orthopedics;  Laterality: Right;    Current Outpatient Medications  Medication Instructions   cyclobenzaprine (FLEXERIL) 10 MG tablet TAKE 1  TABLET BY MOUTH AT BEDTIME AS NEEDED FOR MUSCLE SPASMS.   levothyroxine (SYNTHROID) 137 MCG tablet TAKE 1 TABLET BY MOUTH EVERY DAY BEFORE BREAKFAST   lisinopril-hydrochlorothiazide (ZESTORETIC) 10-12.5 MG tablet TAKE 1 TABLET BY MOUTH EVERY DAY   pantoprazole (PROTONIX) 40 MG tablet TAKE 1 TABLET BY MOUTH EVERY DAY   simvastatin (ZOCOR) 20 MG tablet TAKE 1 TABLET BY MOUTH EVERYDAY AT BEDTIME   zolpidem (AMBIEN) 10 MG tablet TAKE 1 TABLET BY MOUTH AT BEDTIME AS NEEDED FOR SLEEP.       Objective:   Physical Exam BP 126/80   Pulse 67   Temp 98 F (36.7 C) (Oral)   Resp 18   Ht _0  (1.6 m)   Wt 254 lb 4 oz (115.3 kg)   SpO2 98%   BMI 45.04 kg/m  General:   Well developed, NAD, BMI noted.  HEENT:  Normocephalic . Face symmetric, atraumatic Lungs:  CTA B Normal respiratory effort, no intercostal retractions, no accessory muscle use. Heart: RRR,  no murmur.  Abdomen:  Not distended, soft, non-tender. No rebound or rigidity.   Skin: Not pale. Not jaundice Lower extremities: no pretibial edema bilaterally  Neurologic:  alert & oriented X3.  Speech normal, gait appropriate for age and unassisted Psych--  Cognition and judgment appear intact.  Cooperative with normal attention span and concentration.  Behavior appropriate. No anxious or depressed appearing.  Assessment      ASSESSMENT HTN Hyperlipidemia Insomnia-- ambien Hypothyroidism, s/p thyroidectomy Morbid obesity GERD Mild anemia, normal iron 12-2013, cscope 08-2015 DJD  h/o kidney stones  CP-DOE: Normal stress test 08/2010   PLAN: GERD: Carries a diagnosis of GERD, no previous EGD presents with increased burping. No red flags, abdominal exam is benign, she admits she is eating larger portions. Recommend moderation on her diet, Bentyl for few days, call if not gradually better. Grieving: She lost her mother 2 years ago, she was my patient, she is really still missing her and that is one of the reasons she  is not eating healthy.  Her mother was a very nice lady, we had a conversation about her, support provided, knows to call if needs further help with the grieving process.

## 2021-10-08 NOTE — Patient Instructions (Addendum)
Take the medication called Bentyl (dicyclomine) with meals and as needed.  Call if the burping is not getting better soon  Watch your diet, try eating smaller portions  Recommend to proceed with covid booster (bivalent) at your pharmacy.  Flu shot this fall

## 2021-10-10 NOTE — Assessment & Plan Note (Signed)
GERD: Carries a diagnosis of GERD, no previous EGD; presents with increased burping. No red flags, abdominal exam is benign, she admits she is eating larger portions. Recommend moderation on her diet, Bentyl for few days, call if not gradually better. Grieving: pt lost her mother 2 years ago, she was my patient, she still miss her and that is one of the reasons she is not eating healthy.  Her mother was a very nice lady, we had a conversation about her, support provided, knows to call if needs further help with the grieving process.

## 2021-10-26 ENCOUNTER — Encounter (HOSPITAL_BASED_OUTPATIENT_CLINIC_OR_DEPARTMENT_OTHER): Payer: Self-pay

## 2021-10-26 ENCOUNTER — Ambulatory Visit (INDEPENDENT_AMBULATORY_CARE_PROVIDER_SITE_OTHER): Payer: Medicare HMO | Admitting: Internal Medicine

## 2021-10-26 ENCOUNTER — Encounter: Payer: Self-pay | Admitting: Internal Medicine

## 2021-10-26 ENCOUNTER — Emergency Department (HOSPITAL_BASED_OUTPATIENT_CLINIC_OR_DEPARTMENT_OTHER)
Admission: EM | Admit: 2021-10-26 | Discharge: 2021-10-26 | Disposition: A | Discharge status: Home / Self Care | Payer: Medicare HMO | Attending: Emergency Medicine | Admitting: Emergency Medicine

## 2021-10-26 ENCOUNTER — Other Ambulatory Visit: Payer: Self-pay

## 2021-10-26 ENCOUNTER — Emergency Department (HOSPITAL_BASED_OUTPATIENT_CLINIC_OR_DEPARTMENT_OTHER): Payer: Medicare HMO

## 2021-10-26 VITALS — BP 136/84 | HR 77 | Temp 98.4°F | Resp 18 | Ht 63.0 in | Wt 258.4 lb

## 2021-10-26 DIAGNOSIS — R0609 Other forms of dyspnea: Secondary | ICD-10-CM | POA: Diagnosis not present

## 2021-10-26 DIAGNOSIS — R079 Chest pain, unspecified: Secondary | ICD-10-CM

## 2021-10-26 DIAGNOSIS — R072 Precordial pain: Secondary | ICD-10-CM | POA: Insufficient documentation

## 2021-10-26 DIAGNOSIS — Z79899 Other long term (current) drug therapy: Secondary | ICD-10-CM | POA: Insufficient documentation

## 2021-10-26 DIAGNOSIS — R0602 Shortness of breath: Secondary | ICD-10-CM | POA: Diagnosis not present

## 2021-10-26 DIAGNOSIS — I10 Essential (primary) hypertension: Secondary | ICD-10-CM | POA: Insufficient documentation

## 2021-10-26 DIAGNOSIS — R0789 Other chest pain: Secondary | ICD-10-CM | POA: Diagnosis not present

## 2021-10-26 DIAGNOSIS — R61 Generalized hyperhidrosis: Secondary | ICD-10-CM | POA: Diagnosis not present

## 2021-10-26 LAB — BASIC METABOLIC PANEL
Anion gap: 8 (ref 5–15)
BUN: 21 mg/dL (ref 8–23)
CO2: 26 mmol/L (ref 22–32)
Calcium: 8.8 mg/dL — ABNORMAL LOW (ref 8.9–10.3)
Chloride: 104 mmol/L (ref 98–111)
Creatinine, Ser: 1.23 mg/dL — ABNORMAL HIGH (ref 0.44–1.00)
GFR, Estimated: 47 mL/min — ABNORMAL LOW (ref >60)
Glucose, Bld: 101 mg/dL — ABNORMAL HIGH (ref 70–99)
Potassium: 3.7 mmol/L (ref 3.5–5.1)
Sodium: 138 mmol/L (ref 135–145)

## 2021-10-26 LAB — CBC
HCT: 35.8 % — ABNORMAL LOW (ref 36.0–46.0)
Hemoglobin: 12.1 g/dL (ref 12.0–15.0)
MCH: 31.2 pg (ref 26.0–34.0)
MCHC: 33.8 g/dL (ref 30.0–36.0)
MCV: 92.3 fL (ref 80.0–100.0)
Platelets: 187 10*3/uL (ref 150–400)
RBC: 3.88 MIL/uL (ref 3.87–5.11)
RDW: 13.1 % (ref 11.5–15.5)
WBC: 5.1 10*3/uL (ref 4.0–10.5)
nRBC: 0 % (ref 0.0–0.2)

## 2021-10-26 LAB — TROPONIN I (HIGH SENSITIVITY)
Troponin I (High Sensitivity): 3 ng/L (ref <18)
Troponin I (High Sensitivity): 4 ng/L (ref <18)

## 2021-10-26 LAB — D-DIMER, QUANTITATIVE: D-Dimer, Quant: 0.49 ug/mL-FEU (ref 0.00–0.50)

## 2021-10-26 NOTE — ED Provider Notes (Signed)
[place] [facility] EMERGENCY DEPARTMENT Provider Note   CSN: [id number] Arrival date & time: 10/26/21  1403     History {Add pertinent medical, surgical, social history, OB history to HPI:1} Chief Complaint  Patient presents with   Chest Pain    Kelly Perkins is a 71 y.o. female.  She was sent in from her primary care doctor's office for evaluation of chest pain and shortness of breath its been going on for a week.  She said it is there most every day.  Worsens with climbing stairs.  1 time was associated with some diaphoresis.  Denies any cough or phlegm production no fevers or chills no nausea vomiting abdominal pain leg swelling leg pain.  No recent travel or recent medication changes.  No history of cardiac disease and non-smoker.  The history is provided by the patient.  Chest Pain Pain location:  Substernal area Pain quality: aching   Pain radiates to:  Upper back Pain severity:  Moderate Onset quality:  Gradual Duration:  1 week Timing:  Intermittent Progression:  Unchanged Chronicity:  New Relieved by:  None tried Worsened by:  Exertion Ineffective treatments:  None tried Associated symptoms: diaphoresis and shortness of breath   Associated symptoms: no abdominal pain, no cough, no fever, no headache, no nausea and no vomiting   Risk factors: high cholesterol and hypertension        Home Medications Prior to Admission medications   Medication Sig Start Date End Date Taking? Authorizing Provider  cyclobenzaprine (FLEXERIL) 10 MG tablet TAKE 1 TABLET BY MOUTH AT BEDTIME AS NEEDED FOR MUSCLE SPASMS. 09/13/21   Colon Branch, MD  dicyclomine (BENTYL) 10 MG capsule Take 1 capsule (10 mg total) by mouth 4 (four) times daily -  before meals and at bedtime. 10/08/21   Colon Branch, MD  levothyroxine (SYNTHROID) 137 MCG tablet TAKE 1 TABLET BY MOUTH EVERY DAY BEFORE BREAKFAST 09/13/21   Colon Branch, MD  lisinopril-hydrochlorothiazide (ZESTORETIC) 10-12.5 MG tablet TAKE 1 TABLET  BY MOUTH EVERY DAY 08/31/21   Colon Branch, MD  pantoprazole (PROTONIX) 40 MG tablet TAKE 1 TABLET BY MOUTH EVERY DAY 09/13/21   Colon Branch, MD  simvastatin (ZOCOR) 20 MG tablet TAKE 1 TABLET BY MOUTH EVERYDAY AT BEDTIME 08/31/21   Colon Branch, MD  zolpidem (AMBIEN) 10 MG tablet TAKE 1 TABLET BY MOUTH AT BEDTIME AS NEEDED FOR SLEEP. 07/13/21   Colon Branch, MD      Allergies    Hydrocodone bitartrate er    Review of Systems   Review of Systems  Constitutional:  Positive for diaphoresis. Negative for fever.  HENT:  Negative for sore throat.   Respiratory:  Positive for shortness of breath. Negative for cough.   Cardiovascular:  Positive for chest pain.  Gastrointestinal:  Negative for abdominal pain, nausea and vomiting.  Genitourinary:  Negative for dysuria.  Skin:  Negative for rash.  Neurological:  Negative for headaches.    Physical Exam Updated Vital Signs BP (!) 163/94 (BP Location: Left Arm)   Pulse 65   Temp 98.5 F (36.9 C) (Oral)   Resp 20   Ht '5\' 3"'$  (1.6 m)   Wt 117.2 kg   SpO2 98%   BMI 45.77 kg/m  Physical Exam Vitals and nursing note reviewed.  Constitutional:      General: She is not in acute distress.    Appearance: Normal appearance. She is well-developed. She is obese.  HENT:  Head: Normocephalic and atraumatic.  Eyes:     Conjunctiva/sclera: Conjunctivae normal.  Cardiovascular:     Rate and Rhythm: Normal rate and regular rhythm.     Heart sounds: Normal heart sounds. No murmur heard. Pulmonary:     Effort: Pulmonary effort is normal. No respiratory distress.     Breath sounds: Normal breath sounds.  Abdominal:     Palpations: Abdomen is soft.     Tenderness: There is no abdominal tenderness. There is no guarding or rebound.  Musculoskeletal:        General: No swelling. Normal range of motion.     Cervical back: Neck supple.     Right lower leg: No tenderness. No edema.     Left lower leg: No tenderness. No edema.  Skin:    General: Skin is  warm and dry.     Capillary Refill: Capillary refill takes less than 2 seconds.  Neurological:     General: No focal deficit present.     Mental Status: She is alert.     Gait: Gait normal.     ED Results / Procedures / Treatments   Labs (all labs ordered are listed, but only abnormal results are displayed) Labs Reviewed  BASIC METABOLIC PANEL - Abnormal; Notable for the following components:      Result Value   Glucose, Bld 101 (*)    Creatinine, Ser 1.23 (*)    Calcium 8.8 (*)    GFR, Estimated 47 (*)    All other components within normal limits  CBC - Abnormal; Notable for the following components:   HCT 35.8 (*)    All other components within normal limits  D-DIMER, QUANTITATIVE  TROPONIN I (HIGH SENSITIVITY)  TROPONIN I (HIGH SENSITIVITY)    EKG EKG Interpretation  Date/Time:  Tuesday October 26 2021 14:10:42 EDT Ventricular Rate:  66 PR Interval:  210 QRS Duration: 82 QT Interval:  398 QTC Calculation: 417 R Axis:   12 Text Interpretation: Sinus rhythm with 1st degree A-V block Cannot rule out Anterior infarct , age undetermined Abnormal ECG When compared with ECG of 23-Nov-2017 09:48, No significant change since last tracing Confirmed by Aletta Edouard [phone]) on 10/26/2021 2:55:17 PM  Radiology DG Chest 2 View  Result Date: 10/26/2021 CLINICAL DATA:  Chest pain EXAM: CHEST - 2 VIEW COMPARISON:  Previous studies including the examination of 08/08/2010 FINDINGS: Transverse diameter of heart is increased. Thoracic aorta is tortuous and ectatic. Lung fields are clear of any infiltrates or pulmonary edema. There is no pleural effusion or pneumothorax. IMPRESSION: Cardiomegaly. There are no signs of pulmonary edema or focal pulmonary consolidation. Electronically Signed   By: Elmer Picker M.D.   On: 10/26/2021 14:26    Procedures Procedures  {Document cardiac monitor, telemetry assessment procedure when appropriate:1}  Medications Ordered in ED Medications -  No data to display  ED Course/ Medical Decision Making/ A&P                           Medical Decision Making Amount and/or Complexity of Data Reviewed Labs: ordered. Radiology: ordered.   This patient complains of ***; this involves an extensive number of treatment Options and is a complaint that carries with it a high risk of complications and morbidity. The differential includes ***  I ordered, reviewed and interpreted labs, which included *** I ordered medication *** and reviewed PMP when indicated. I ordered imaging studies which included *** and I independently  visualized and interpreted imaging which showed *** Additional history obtained from *** Previous records obtained and reviewed *** I consulted *** and discussed lab and imaging findings and discussed disposition.  Cardiac monitoring reviewed, *** Social determinants considered, *** Critical Interventions: ***  After the interventions stated above, I reevaluated the patient and found *** Admission and further testing considered, ***   {Document critical care time when appropriate:1} {Document review of labs and clinical decision tools ie heart score, Chads2Vasc2 etc:1}  {Document your independent review of radiology images, and any outside records:1} {Document your discussion with family members, caretakers, and with consultants:1} {Document social determinants of health affecting pt's care:1} {Document your decision making why or why not admission, treatments were needed:1} Final Clinical Impression(s) / ED Diagnoses Final diagnoses:  None    Rx / DC Orders ED Discharge Orders     None

## 2021-10-26 NOTE — ED Triage Notes (Signed)
C/o intermittent midsternal chest pain radiating to back x 1 week with SOB.

## 2021-10-26 NOTE — ED Notes (Signed)
D/c paperwork reviewed with pt, including f/u care. Pt verbalized understanding, no questions or concerns at time of d/c. Ambulatory to ED exit.  

## 2021-10-26 NOTE — Patient Instructions (Signed)
Recommend to proceed with the following vaccines at your pharmacy:  Covid booster (bivalent) Flu shot this fall

## 2021-10-26 NOTE — Progress Notes (Signed)
Subjective:    Patient ID: Kelly Perkins, female    DOB: [birth year], 71 y.o.   MRN: [id number]  DOS:  10/26/2021 Type of visit - description: Acute  Was seen 2 weeks ago with burping, prescribed Bentyl. Current symptoms started 1 week ago: Chest pain on and off, located at the front chest, radiates to the back.  No neck or shoulder radiation. Associated with sweats sometimes but no nausea or vomiting. Is not particularly worse with exertion or eating. Is happening 2-3 times a day.  Last 5 minutes.  Also having shortness of breath, that is happening most of the time since the last week. She feels the best when she is resting in bed. + DOE after half block.  Used to be able to walk a lot longer.  Denies fever chills No lower extremity edema or calf pain No recent cough. No recent airplane trip or prolonged car trip.    Review of Systems See above   Past Medical History:  Diagnosis Date   GERD (gastroesophageal reflux disease)    on meds   History of kidney stones    Hyperlipidemia 09/10/2010   on meds   Hypertension    on meds   Hypothyroidism    on meds   Normal cardiac stress test 08/2010   had CP-DOE   Osteoarthritis    generalizd   Wears dentures    top   Wears glasses     Past Surgical History:  Procedure Laterality Date   ABDOMINAL HYSTERECTOMY  2000   B oophoreectomy, d/t DUB   COLONOSCOPY  2017   JP-MAC-5 yr recall-poor prep-normal   CYSTOSCOPY/URETEROSCOPY/HOLMIUM LASER/STENT PLACEMENT Right 11/17/2017   Procedure: CYSTOSCOPY/RIGHT URETEROSCOPY/RIGHT HOLMIUM LASER/STENT PLACEMENT;  Surgeon: Ceasar Mons, MD;  Location: [facility];  Service: Urology;  Laterality: Right;   JOINT REPLACEMENT     LITHOTRIPSY  2004   urolthiasis    MASS EXCISION  09/12/2011   Procedure: EXCISION MASS;  Surgeon: Cristine Polio, MD;  Location: [place];  Service: Plastics;  Laterality: Left;  left ring finger   POLYPECTOMY     TA 2004    THYROIDECTOMY Left 2006   TOTAL KNEE ARTHROPLASTY Left 11/17/2016   Procedure: LEFT TOTAL KNEE ARTHROPLASTY;  Surgeon: Leandrew Koyanagi, MD;  Location: [place];  Service: Orthopedics;  Laterality: Left;   TOTAL KNEE ARTHROPLASTY Right 12/04/2017   Procedure: RIGHT TOTAL KNEE ARTHROPLASTY;  Surgeon: Leandrew Koyanagi, MD;  Location: [place];  Service: Orthopedics;  Laterality: Right;    Current Outpatient Medications  Medication Instructions   cyclobenzaprine (FLEXERIL) 10 MG tablet TAKE 1 TABLET BY MOUTH AT BEDTIME AS NEEDED FOR MUSCLE SPASMS.   dicyclomine (BENTYL) 10 mg, Oral, 3 times daily before meals & bedtime   levothyroxine (SYNTHROID) 137 MCG tablet TAKE 1 TABLET BY MOUTH EVERY DAY BEFORE BREAKFAST   lisinopril-hydrochlorothiazide (ZESTORETIC) 10-12.5 MG tablet TAKE 1 TABLET BY MOUTH EVERY DAY   pantoprazole (PROTONIX) 40 MG tablet TAKE 1 TABLET BY MOUTH EVERY DAY   simvastatin (ZOCOR) 20 MG tablet TAKE 1 TABLET BY MOUTH EVERYDAY AT BEDTIME   zolpidem (AMBIEN) 10 MG tablet TAKE 1 TABLET BY MOUTH AT BEDTIME AS NEEDED FOR SLEEP.       Objective:   Physical Exam BP 136/84   Pulse 77   Temp 98.4 F (36.9 C) (Oral)   Resp 18   Ht _0  (1.6 m)   Wt 258 lb 6 oz (117.2 kg)  SpO2 97%   BMI 45.77 kg/m  General:   Well developed, NAD, BMI noted.  HEENT:  Normocephalic . Face symmetric, atraumatic Lungs:  CTA B Normal respiratory effort, no intercostal retractions, no accessory muscle use. Heart: RRR,  no murmur.  Abdomen:  Not distended, soft, non-tender. No rebound or rigidity.   Skin: Not pale. Not jaundice Lower extremities: no pretibial edema bilaterally  Neurologic:  alert & oriented X3.  Speech normal, gait appropriate for age and unassisted Psych--  Cognition and judgment appear intact.  Cooperative with normal attention span and concentration.  Behavior appropriate. No anxious or depressed appearing.     Assessment    ASSESSMENT HTN Hyperlipidemia Insomnia--  ambien Hypothyroidism, s/p thyroidectomy Morbid obesity GERD Mild anemia, normal iron 12-2013, cscope 08-2015 DJD  h/o kidney stones  CP-DOE: Normal stress test 08/2010   PLAN: Chest pain, shortness of breath: New onset on the last week, CV RF includes HTN, hyperlipidemia, morbid obesity. EKG today:NSR, low voltages, no acute changes. Currently chest pain-free. Advised patient, this could be angina. Case discussed with the ER physician who accepted to see the patient.  Patient agreeable.

## 2021-10-26 NOTE — Assessment & Plan Note (Signed)
Chest pain, shortness of breath: New onset on the last week, CV RF includes HTN, hyperlipidemia, morbid obesity. EKG today:NSR, low voltages, no acute changes. Currently chest pain-free. Advised patient, this could be angina. Case discussed with the ER physician who accepted to see the patient.  Patient agreeable.

## 2021-10-26 NOTE — ED Notes (Signed)
2nd troponin sent to lab.

## 2021-10-26 NOTE — Discharge Instructions (Signed)
You are seen in the emergency department for evaluation of chest pain and shortness of breath.  You had blood work EKG and a chest x-ray that did not show an obvious explanation for your symptoms.  There is no evidence of heart attack.  Please follow-up with your primary care doctor.  Return to the emergency department if any worsening or concerning symptoms.

## 2021-10-26 NOTE — ED Notes (Signed)
Spoke with Maggie in lab to add on d-dimer

## 2021-12-14 ENCOUNTER — Other Ambulatory Visit: Payer: Self-pay | Admitting: Internal Medicine

## 2021-12-14 DIAGNOSIS — Z1231 Encounter for screening mammogram for malignant neoplasm of breast: Secondary | ICD-10-CM

## 2022-01-10 ENCOUNTER — Telehealth: Payer: Self-pay

## 2022-01-10 MED ORDER — ZOLPIDEM TARTRATE 10 MG PO TABS: 10.0000 mg | ORAL_TABLET | Freq: Every evening | ORAL | 2 refills | Status: DC | PRN

## 2022-01-10 NOTE — Telephone Encounter (Signed)
Requesting: Ambien '10mg'$   Contract: 03/02/2021 UDS: None Last Visit: 10/26/21 Next Visit: 03/15/22 Last Refill: 07/13/21 #30 and 5RF  Please Advise

## 2022-01-10 NOTE — Telephone Encounter (Signed)
PDMP ok, rx sent

## 2022-01-13 ENCOUNTER — Ambulatory Visit: Payer: Medicare HMO

## 2022-02-24 ENCOUNTER — Other Ambulatory Visit: Payer: Self-pay | Admitting: Internal Medicine

## 2022-02-27 IMAGING — MG MM DIGITAL SCREENING BILAT W/ TOMO AND CAD
6 of 12 series · 6 of 36 positions shown · non-contrast
Comparison: Previous exam(s).

ACR Breast Density Category a: The breast tissue is almost entirely
fatty.

CLINICAL DATA: Screening.

EXAM:
DIGITAL SCREENING BILATERAL MAMMOGRAM WITH TOMOSYNTHESIS AND CAD
TECHNIQUE: Bilateral screening digital craniocaudal and mediolateral oblique
mammograms were obtained. Bilateral screening digital breast
tomosynthesis was performed. The images were evaluated with
computer-aided detection.

[R CV synth-2D]
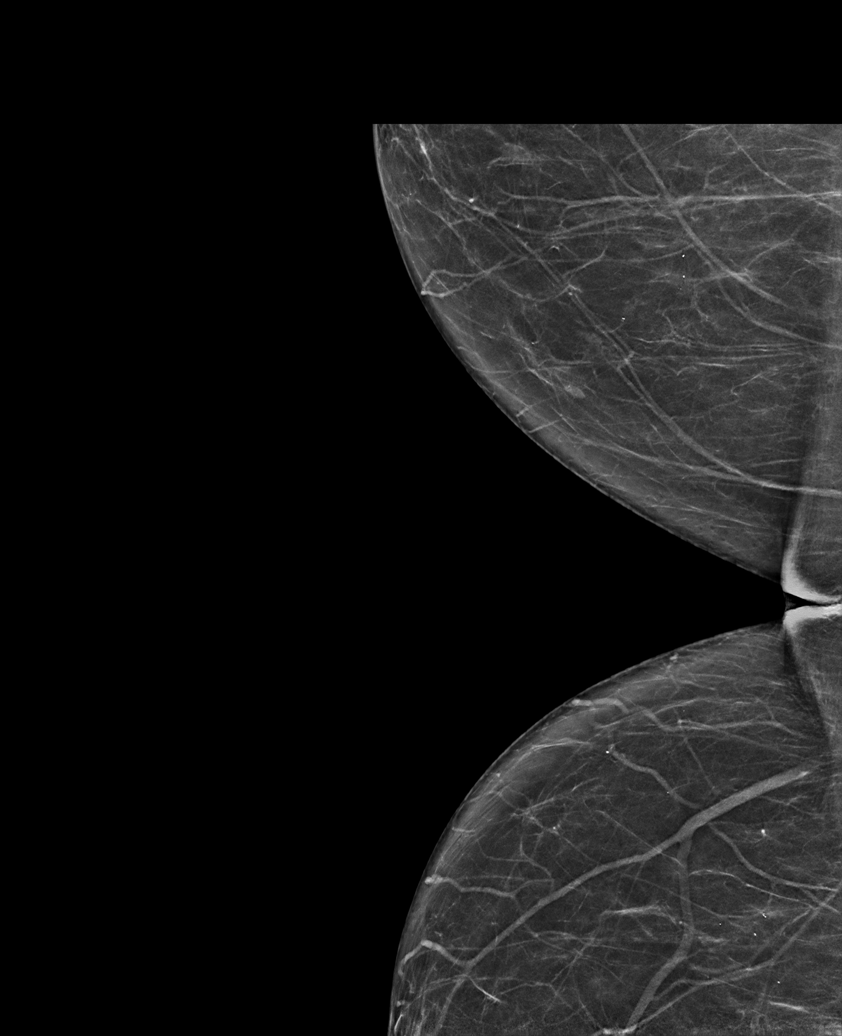

[R CC synth-2D]
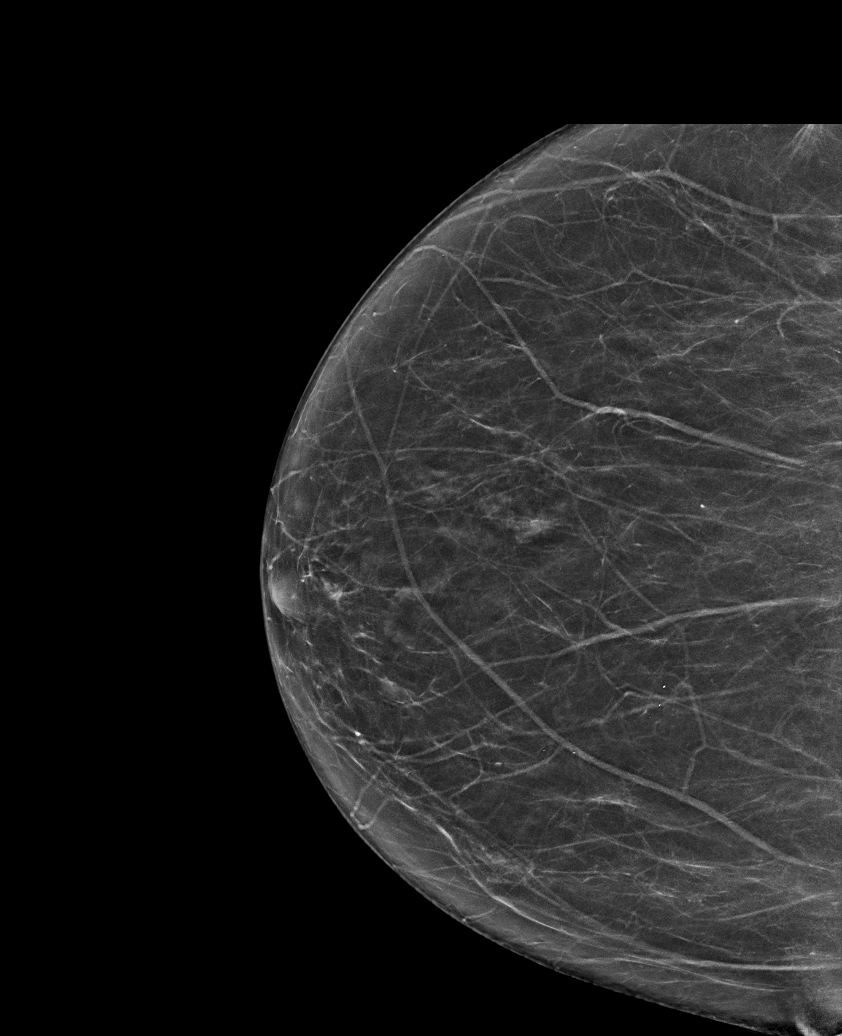

[L MLO synth-2D]
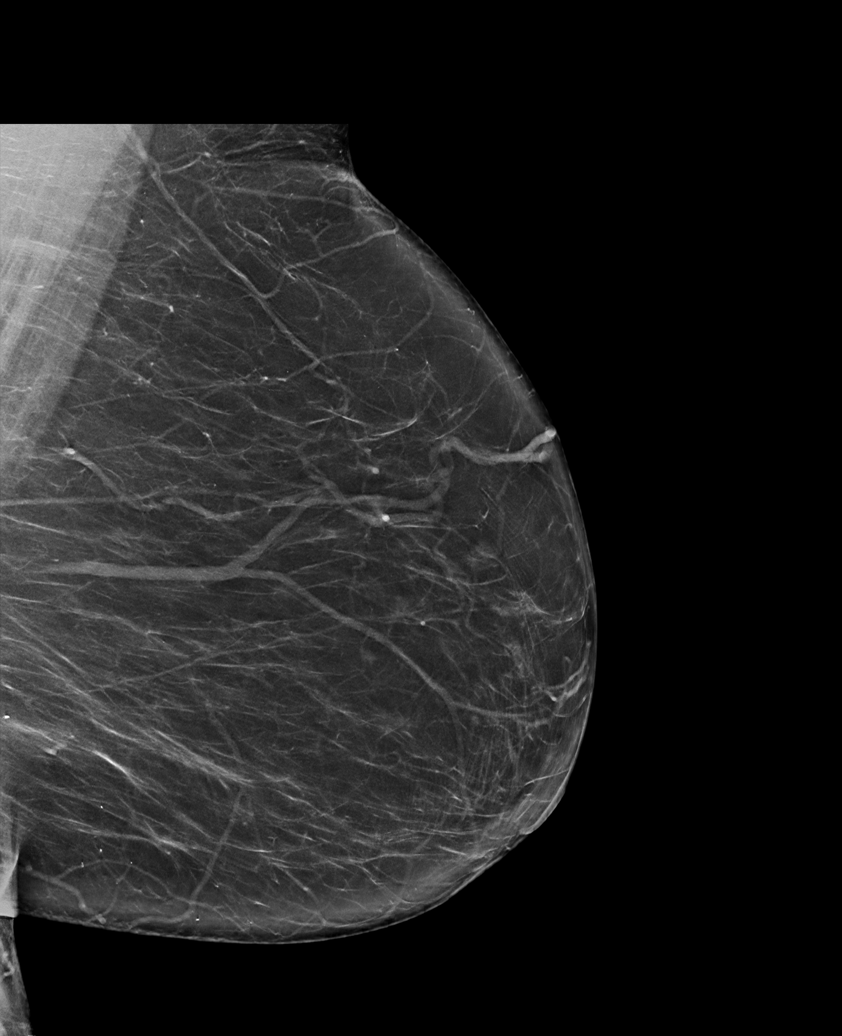

[R MLO synth-2D (1 of 2)]
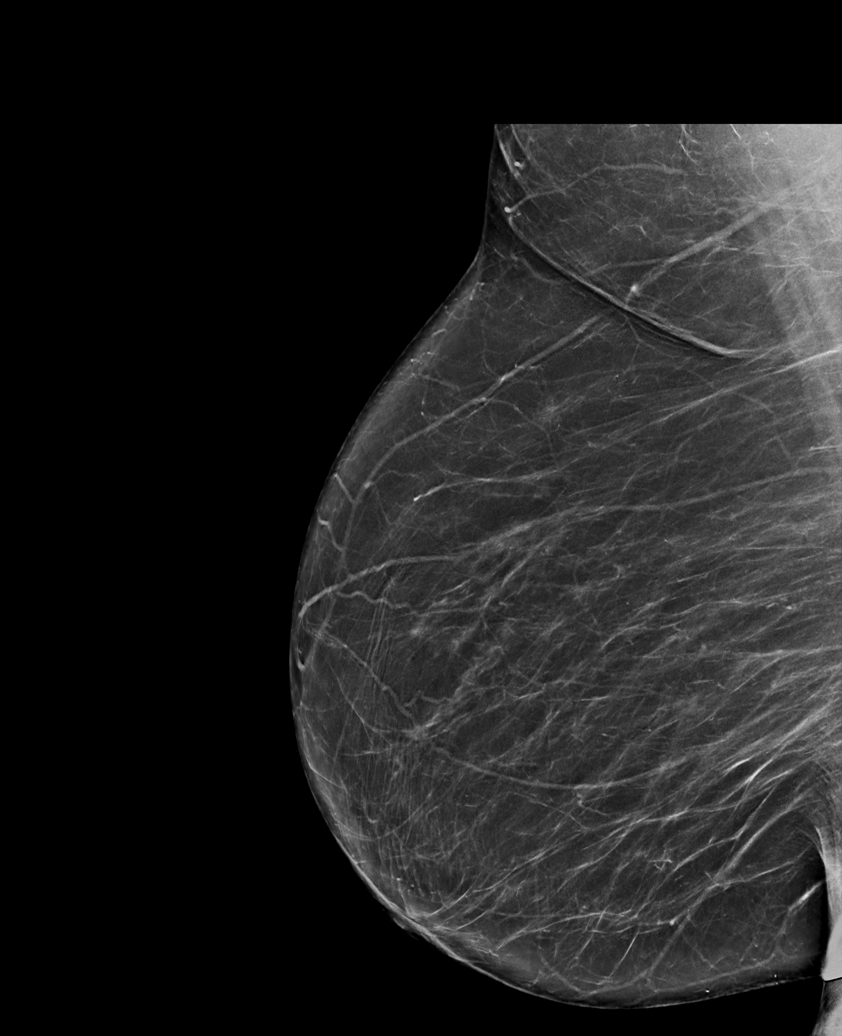

[L CC synth-2D]
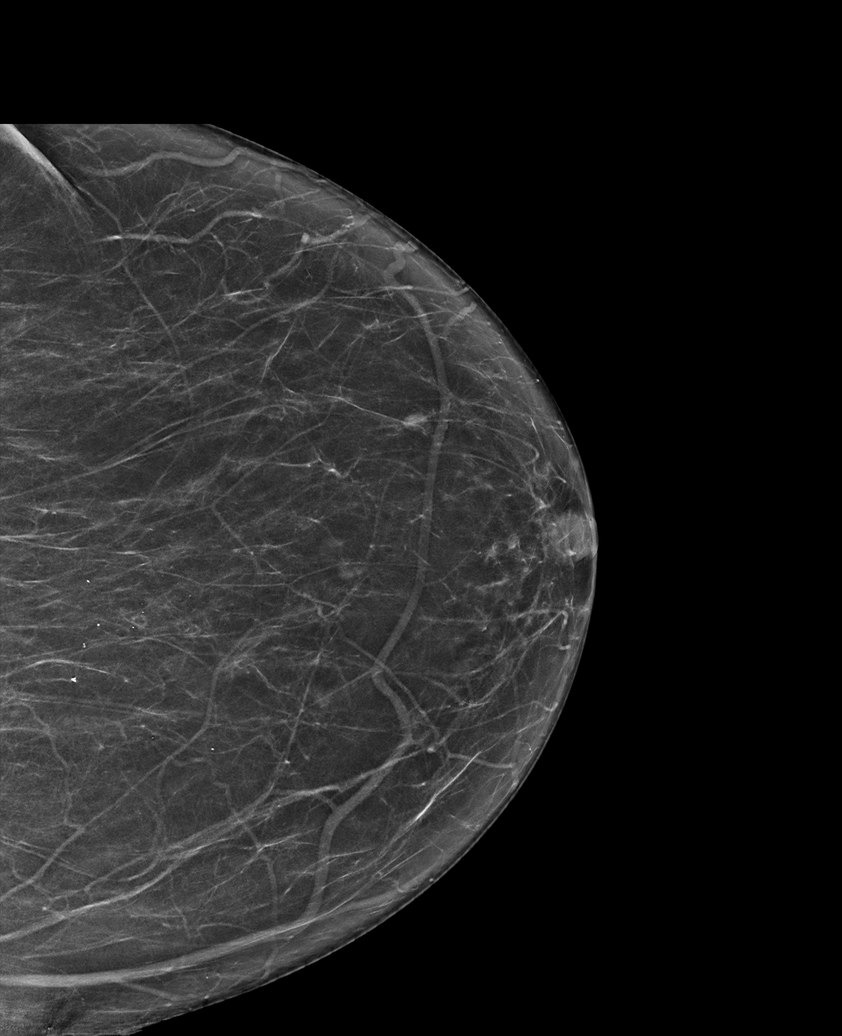

[R MLO synth-2D (2 of 2)]
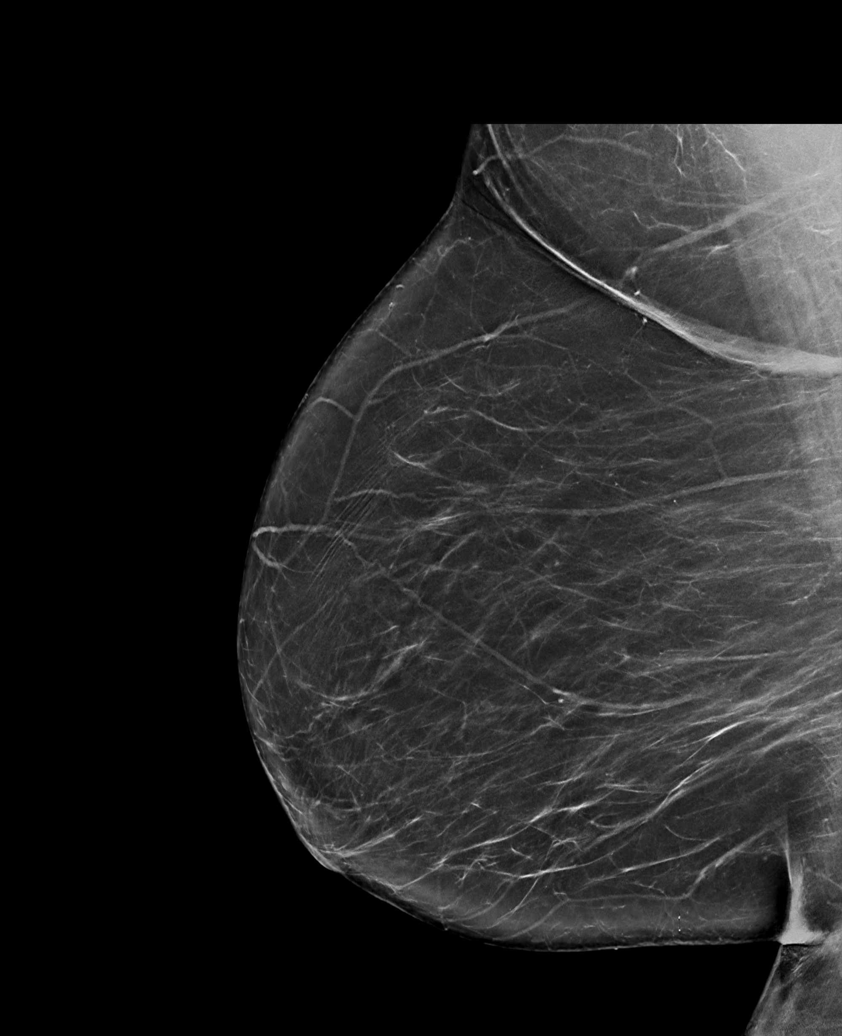

[6 of 36 positions shown; findings below may reference images not displayed]

FINDINGS: There are no findings suspicious for malignancy.
IMPRESSION: No mammographic evidence of malignancy. A result letter of this
screening mammogram will be mailed directly to the patient.

RECOMMENDATION:
Screening mammogram in one year. (Code:0E-3-N98)

BI-RADS CATEGORY  1: Negative.

## 2022-03-11 ENCOUNTER — Ambulatory Visit
Admission: RE | Admit: 2022-03-11 | Discharge: 2022-03-11 | Disposition: A | Discharge status: Home / Self Care | Payer: Medicare HMO | Source: Ambulatory Visit | Attending: Internal Medicine | Admitting: Internal Medicine

## 2022-03-11 DIAGNOSIS — Z1231 Encounter for screening mammogram for malignant neoplasm of breast: Secondary | ICD-10-CM

## 2022-03-15 ENCOUNTER — Ambulatory Visit (INDEPENDENT_AMBULATORY_CARE_PROVIDER_SITE_OTHER): Payer: Medicare HMO | Admitting: Internal Medicine

## 2022-03-15 ENCOUNTER — Encounter: Payer: Self-pay | Admitting: Internal Medicine

## 2022-03-15 VITALS — BP 130/78 | HR 82 | Temp 97.5°F | Resp 16 | Ht 63.0 in | Wt 251.4 lb

## 2022-03-15 DIAGNOSIS — R079 Chest pain, unspecified: Secondary | ICD-10-CM | POA: Diagnosis not present

## 2022-03-15 DIAGNOSIS — Z0000 Encounter for general adult medical examination without abnormal findings: Secondary | ICD-10-CM

## 2022-03-15 DIAGNOSIS — E039 Hypothyroidism, unspecified: Secondary | ICD-10-CM

## 2022-03-15 DIAGNOSIS — R0609 Other forms of dyspnea: Secondary | ICD-10-CM

## 2022-03-15 DIAGNOSIS — Z23 Encounter for immunization: Secondary | ICD-10-CM | POA: Diagnosis not present

## 2022-03-15 DIAGNOSIS — Z0001 Encounter for general adult medical examination with abnormal findings: Secondary | ICD-10-CM

## 2022-03-15 DIAGNOSIS — Z780 Asymptomatic menopausal state: Secondary | ICD-10-CM | POA: Diagnosis not present

## 2022-03-15 DIAGNOSIS — I10 Essential (primary) hypertension: Secondary | ICD-10-CM

## 2022-03-15 DIAGNOSIS — E785 Hyperlipidemia, unspecified: Secondary | ICD-10-CM | POA: Diagnosis not present

## 2022-03-15 LAB — LIPID PANEL
Cholesterol: 177 mg/dL (ref 0–200)
HDL: 51.5 mg/dL (ref >39.00)
LDL Cholesterol: 108 mg/dL — ABNORMAL HIGH (ref 0–99)
NonHDL: 125.53
Total CHOL/HDL Ratio: 3
Triglycerides: 89 mg/dL (ref 0.0–149.0)
VLDL: 17.8 mg/dL (ref 0.0–40.0)

## 2022-03-15 LAB — COMPREHENSIVE METABOLIC PANEL
ALT: 22 U/L (ref 0–35)
AST: 18 U/L (ref 0–37)
Albumin: 4.1 g/dL (ref 3.5–5.2)
Alkaline Phosphatase: 98 U/L (ref 39–117)
BUN: 15 mg/dL (ref 6–23)
CO2: 28 mEq/L (ref 19–32)
Calcium: 9.5 mg/dL (ref 8.4–10.5)
Chloride: 105 mEq/L (ref 96–112)
Creatinine, Ser: 0.91 mg/dL (ref 0.40–1.20)
GFR: 62.68 mL/min (ref >60.00)
Glucose, Bld: 114 mg/dL — ABNORMAL HIGH (ref 70–99)
Potassium: 4.2 mEq/L (ref 3.5–5.1)
Sodium: 141 mEq/L (ref 135–145)
Total Bilirubin: 0.5 mg/dL (ref 0.2–1.2)
Total Protein: 6.7 g/dL (ref 6.0–8.3)

## 2022-03-15 LAB — TSH: TSH: 2.71 u[IU]/mL (ref 0.35–5.50)

## 2022-03-15 NOTE — Progress Notes (Signed)
Subjective:    Patient ID: Kelly Perkins, female    DOB: 04-29-1950, 72 y.o.   MRN: [id number]  DOS:  03/15/2022 Type of visit - description: cpx  For CPX. At the last visit, complained of chest pain, DOE: Went to the ER, workup negative, symptoms resolved. In general feeling well.   Review of Systems  Other than above, a 14 point review of systems is negative     Past Medical History:  Diagnosis Date   GERD (gastroesophageal reflux disease)    on meds   History of kidney stones    Hyperlipidemia 09/10/2010   on meds   Hypertension    on meds   Hypothyroidism    on meds   Normal cardiac stress test 08/2010   had CP-DOE   Osteoarthritis    generalizd   Wears dentures    top   Wears glasses     Past Surgical History:  Procedure Laterality Date   ABDOMINAL HYSTERECTOMY  2000   B oophoreectomy, d/t DUB   COLONOSCOPY  2017   JP-MAC-5 yr recall-poor prep-normal   CYSTOSCOPY/URETEROSCOPY/HOLMIUM LASER/STENT PLACEMENT Right 11/17/2017   Procedure: CYSTOSCOPY/RIGHT URETEROSCOPY/RIGHT HOLMIUM LASER/STENT PLACEMENT;  Surgeon: Ceasar Mons, MD;  Location: [facility];  Service: Urology;  Laterality: Right;   JOINT REPLACEMENT     LITHOTRIPSY  2004   urolthiasis    MASS EXCISION  09/12/2011   Procedure: EXCISION MASS;  Surgeon: Cristine Polio, MD;  Location: [facility];  Service: Plastics;  Laterality: Left;  left ring finger   POLYPECTOMY     TA 2004   THYROIDECTOMY Left 2006   TOTAL KNEE ARTHROPLASTY Left 11/17/2016   Procedure: LEFT TOTAL KNEE ARTHROPLASTY;  Surgeon: Leandrew Koyanagi, MD;  Location: [place];  Service: Orthopedics;  Laterality: Left;   TOTAL KNEE ARTHROPLASTY Right 12/04/2017   Procedure: RIGHT TOTAL KNEE ARTHROPLASTY;  Surgeon: Leandrew Koyanagi, MD;  Location: [place];  Service: Orthopedics;  Laterality: Right;   Social History   Socioeconomic History   Marital status: Divorced    Spouse name: Not on file   Number of children: 2    Years of education: Not on file   Highest education level: Not on file  Occupational History   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.     lives by herself      Right Handed    Lives in a two story home   Social Determinants of Health   Financial Resource Strain: Low Risk  (08/04/2021)   Overall Financial Resource Strain (CARDIA)    Difficulty of Paying Living Expenses: Not hard at all  Food Insecurity: No Food Insecurity (08/04/2021)   Hunger Vital Sign    Worried About Running Out of Food in the Last Year: Never true    Ran Out of Food in the Last Year: Never true  Transportation Needs: No Transportation Needs (08/04/2021)   PRAPARE - Hydrologist (  Medical): No    Lack of Transportation (Non-Medical): No  Physical Activity: Sufficiently Active (08/04/2021)   Exercise Vital Sign    Days of Exercise per Week: 7 days    Minutes of Exercise per Session: 30 min  Stress: No Stress Concern Present (08/04/2021)   [place]    Feeling of Stress : Not at all  Social Connections: Moderately Isolated (08/04/2021)   Social Connection and Isolation Panel [NHANES]    Frequency of Communication with Friends and Family: More than three times a week    Frequency of Social Gatherings with Friends and Family: Once a week    Attends Religious Services: More than 4 times per year    Active Member of [employer] or Organizations: No    Attends Archivist Meetings: Never     Marital Status: Divorced  Human resources officer Violence: Not At Risk (08/04/2021)   Humiliation, Afraid, Rape, and Kick questionnaire    Fear of Current or Ex-Partner: No    Emotionally Abused: No    Physically Abused: No    Sexually Abused: No     Current Outpatient Medications  Medication Instructions   cyclobenzaprine (FLEXERIL) 10 MG tablet TAKE 1 TABLET BY MOUTH AT BEDTIME AS NEEDED FOR MUSCLE SPASMS.   dicyclomine (BENTYL) 10 mg, Oral, 3 times daily before meals & bedtime   levothyroxine (SYNTHROID) 137 MCG tablet TAKE 1 TABLET BY MOUTH EVERY DAY BEFORE BREAKFAST   lisinopril-hydrochlorothiazide (ZESTORETIC) 10-12.5 MG tablet 1 tablet, Oral, Daily   pantoprazole (PROTONIX) 40 MG tablet TAKE 1 TABLET BY MOUTH EVERY DAY   simvastatin (ZOCOR) 20 mg, Oral, Daily at bedtime   zolpidem (AMBIEN) 10 mg, Oral, At bedtime PRN, for sleep       Objective:   Physical Exam BP 130/78 (BP Location: Right Arm, Patient Position: Sitting, Cuff Size: Normal)   Pulse 82   Temp (!) 97.5 F (36.4 C) (Oral)   Resp 16   Ht _0  (1.6 m)   Wt 251 lb 6.4 oz (114 kg)   SpO2 98%   BMI 44.53 kg/m  General: Well developed, NAD, BMI noted Neck: No  thyromegaly  HEENT:  Normocephalic . Face symmetric, atraumatic Lungs:  CTA B Normal respiratory effort, no intercostal retractions, no accessory muscle use. Heart: RRR,  no murmur.  Abdomen:  Not distended, soft, non-tender. No rebound or rigidity.   Lower extremities: no pretibial edema bilaterally  Skin: Exposed areas without rash. Not pale. Not jaundice Neurologic:  alert & oriented X3.  Speech normal, gait appropriate for age and unassisted Strength symmetric and appropriate for age.  Psych: Cognition and judgment appear intact.  Cooperative with normal attention span and concentration.  Behavior appropriate. No anxious or depressed appearing.     Assessment   ASSESSMENT HTN Hyperlipidemia Insomnia-- ambien Hypothyroidism, s/p  thyroidectomy Morbid obesity GERD Mild anemia, normal iron 12-2013, cscope 08-2015 DJD  h/o kidney stones  CP-DOE: Normal stress test 08/2010   PLAN: Here for CPX HTN: BP today is very good, on Zestoretic, check labs Hyperlipidemia: On simvastatin, no apparent side effects, check FLP Insomnia: On Ambien, 10 mg as needed, decreased to 5 mg?  Declined to reduce dose, in the past a lower dose did not work for her.  Will reassess th Chest pain, DOE: See LOV, was seen at the ER , w/u reviewed:  creatinine was 1.2 was slightly high, CBC okay, troponin negative, chest x-ray with cardiomegaly but no pleural  effusion, D-dimer negative.  No further symptoms, she does have FH of CAD.  Plan: Myoview. Hypothyroidism: On Synthroid, check TSH Morbid obesity: Diet and exercise discussed RTC 4 months

## 2022-03-15 NOTE — Patient Instructions (Addendum)
Will schedule a stress test to check your heart  Stop by the first floor and schedule a bone density test  Take vitamin D 2000 units every day (over-the-counter)   Check the  blood pressure regularly BP GOAL is between 110/65 and  135/85. If it is consistently higher or lower, let me know      GO TO THE LAB : Get the blood work     [place], [place] back for a checkup in  4 months     "[place] of attorney" ,  "Living will" (Advance care planning documents)  If you already have a living will or healthcare power of attorney, is recommended you bring the copy to be scanned in your chart.   The document will be available to all the doctors you see in the system.  Advance care planning is a process that supports adults in  understanding and sharing their preferences regarding future medical care.  The patient's preferences are recorded in documents called Advance Directives and the can be modified at any time while the patient is in full mental capacity.   If you don't have one, please consider create one.      More information at: [email or web address]

## 2022-03-15 NOTE — Assessment & Plan Note (Addendum)
--  Td 06-2017 - PNM 23: 2015 and 2020 - PNM 13 06-22-15 -s/p Zostavax: 2016 and shingrix x 2 - RSV d/w pt -COVID vaccines: Booster recommended, states she will proceed. -Fu shot today --CCS: cscope ~2011, poor prep. Colonoscopy again 08/2015, no biopsies, Cscope 09-2020, 5 years per GI letter --Per guidelines  no further Pap smears --Mammogram done last week per pt  --DEXA wnl 06-2015, order DEXA, recommend vitamin D - Labs: CMP, CBC, TSH -Healthcare POA: See AVS -Diet and exercise discussed

## 2022-03-15 NOTE — Assessment & Plan Note (Signed)
Here for CPX HTN: BP today is very good, on Zestoretic, check labs Hyperlipidemia: On simvastatin, no apparent side effects, check FLP Insomnia: On Ambien, 10 mg as needed, decreased to 5 mg?  Declined to reduce dose, in the past a lower dose did not work for her.  Will reassess th Chest pain, DOE: See LOV, was seen at the ER , w/u reviewed:  creatinine was 1.2 was slightly high, CBC okay, troponin negative, chest x-ray with cardiomegaly but no pleural effusion, D-dimer negative.  No further symptoms, she does have FH of CAD.  Plan: Myoview. Hypothyroidism: On Synthroid, check TSH Morbid obesity: Diet and exercise discussed RTC 4 months

## 2022-03-16 MED ORDER — SIMVASTATIN 40 MG PO TABS: 40.0000 mg | ORAL_TABLET | Freq: Every day | ORAL | 1 refills | Status: DC

## 2022-03-16 NOTE — Addendum Note (Signed)
Addended by: Damita Dunnings D on: 03/16/2022 11:35 AM   Modules accepted: Orders

## 2022-03-31 ENCOUNTER — Ambulatory Visit (HOSPITAL_BASED_OUTPATIENT_CLINIC_OR_DEPARTMENT_OTHER)
Admission: RE | Admit: 2022-03-31 | Discharge: 2022-03-31 | Disposition: A | Discharge status: Home / Self Care | Payer: Medicare HMO | Source: Ambulatory Visit | Attending: Internal Medicine | Admitting: Internal Medicine

## 2022-03-31 DIAGNOSIS — Z780 Asymptomatic menopausal state: Secondary | ICD-10-CM | POA: Insufficient documentation

## 2022-04-10 ENCOUNTER — Telehealth: Payer: Self-pay | Admitting: Internal Medicine

## 2022-04-11 NOTE — Telephone Encounter (Signed)
PDMP reviewed, she is getting phentermine from another clinic, last visit was earlier this month, BP was okay.  Ambien refilled

## 2022-04-11 NOTE — Telephone Encounter (Signed)
Requesting: Ambien '10mg'$   Contract: 03/23/22 UDS: Ambien only Last Visit: 03/15/22 Next Visit: 07/22/22 Last Refill: 01/10/22 #30 and 2RF  Please Advise

## 2022-05-30 ENCOUNTER — Telehealth (HOSPITAL_COMMUNITY): Payer: Self-pay | Admitting: *Deleted

## 2022-05-30 NOTE — Telephone Encounter (Signed)
Left message on voicemail per DPR in reference to upcoming appointment scheduled on  06/06/22 with detailed instructions given per Myocardial Perfusion Study Information Sheet for the test. LM to arrive 15 minutes early, and that it is imperative to arrive on time for appointment to keep from having the test rescheduled. If you need to cancel or reschedule your appointment, please call the office within 24 hours of your appointment. Failure to do so may result in a cancellation of your appointment, and a $50 no show fee. Phone number given for call back for any questions. Kirstie Peri

## 2022-06-04 ENCOUNTER — Other Ambulatory Visit: Payer: Self-pay | Admitting: Internal Medicine

## 2022-06-04 DIAGNOSIS — K219 Gastro-esophageal reflux disease without esophagitis: Secondary | ICD-10-CM

## 2022-06-06 ENCOUNTER — Ambulatory Visit (HOSPITAL_COMMUNITY): Payer: Medicare HMO | Attending: Cardiology

## 2022-06-06 DIAGNOSIS — R079 Chest pain, unspecified: Secondary | ICD-10-CM | POA: Insufficient documentation

## 2022-06-06 DIAGNOSIS — R0609 Other forms of dyspnea: Secondary | ICD-10-CM | POA: Insufficient documentation

## 2022-06-06 MED ORDER — TECHNETIUM TC 99M TETROFOSMIN IV KIT
31.9000 | PACK | Freq: Once | INTRAVENOUS | Status: AC | PRN
  Administered 2022-06-06: 31.9 via INTRAVENOUS

## 2022-06-06 MED ORDER — REGADENOSON 0.4 MG/5ML IV SOLN
0.4000 mg | Freq: Once | INTRAVENOUS | Status: AC
  Administered 2022-06-06: 0.4 mg via INTRAVENOUS

## 2022-06-07 ENCOUNTER — Ambulatory Visit (HOSPITAL_COMMUNITY): Payer: Medicare HMO

## 2022-06-07 LAB — MYOCARDIAL PERFUSION IMAGING
LV dias vol: 80 mL (ref 46–106)
LV sys vol: 34 mL
Nuc Stress EF: 57 %
Peak HR: 86 {beats}/min
Rest HR: 74 {beats}/min
Rest Nuclear Isotope Dose: 32.2 mCi
SDS: 0
SRS: 0
SSS: 0
ST Depression (mm): 0 mm
Stress Nuclear Isotope Dose: 31.9 mCi
TID: 0.99

## 2022-06-07 MED ORDER — TECHNETIUM TC 99M TETROFOSMIN IV KIT
32.2000 | PACK | Freq: Once | INTRAVENOUS | Status: AC | PRN
  Administered 2022-06-07: 32.2 via INTRAVENOUS

## 2022-06-10 ENCOUNTER — Other Ambulatory Visit: Payer: Self-pay | Admitting: Internal Medicine

## 2022-06-11 ENCOUNTER — Other Ambulatory Visit: Payer: Self-pay | Admitting: Internal Medicine

## 2022-06-16 DIAGNOSIS — E039 Hypothyroidism, unspecified: Secondary | ICD-10-CM | POA: Diagnosis not present

## 2022-06-16 DIAGNOSIS — R7309 Other abnormal glucose: Secondary | ICD-10-CM | POA: Diagnosis not present

## 2022-06-16 DIAGNOSIS — N951 Menopausal and female climacteric states: Secondary | ICD-10-CM | POA: Diagnosis not present

## 2022-06-16 DIAGNOSIS — Z6841 Body Mass Index (BMI) 40.0 and over, adult: Secondary | ICD-10-CM | POA: Diagnosis not present

## 2022-06-28 DIAGNOSIS — E039 Hypothyroidism, unspecified: Secondary | ICD-10-CM | POA: Diagnosis not present

## 2022-06-28 DIAGNOSIS — Z6841 Body Mass Index (BMI) 40.0 and over, adult: Secondary | ICD-10-CM | POA: Diagnosis not present

## 2022-06-28 DIAGNOSIS — R768 Other specified abnormal immunological findings in serum: Secondary | ICD-10-CM | POA: Diagnosis not present

## 2022-06-28 DIAGNOSIS — Z1331 Encounter for screening for depression: Secondary | ICD-10-CM | POA: Diagnosis not present

## 2022-06-28 DIAGNOSIS — R7309 Other abnormal glucose: Secondary | ICD-10-CM | POA: Diagnosis not present

## 2022-06-28 DIAGNOSIS — Z1339 Encounter for screening examination for other mental health and behavioral disorders: Secondary | ICD-10-CM | POA: Diagnosis not present

## 2022-07-01 ENCOUNTER — Ambulatory Visit (HOSPITAL_BASED_OUTPATIENT_CLINIC_OR_DEPARTMENT_OTHER)
Admission: RE | Admit: 2022-07-01 | Discharge: 2022-07-01 | Disposition: A | Discharge status: Home / Self Care | Payer: Medicare HMO | Source: Ambulatory Visit | Attending: Family | Admitting: Family

## 2022-07-01 ENCOUNTER — Ambulatory Visit (INDEPENDENT_AMBULATORY_CARE_PROVIDER_SITE_OTHER): Payer: Medicare HMO | Admitting: Family

## 2022-07-01 ENCOUNTER — Encounter: Payer: Self-pay | Admitting: Family

## 2022-07-01 VITALS — BP 126/86 | HR 96 | Temp 97.5°F | Resp 18 | Ht 63.0 in | Wt 263.0 lb

## 2022-07-01 DIAGNOSIS — R109 Unspecified abdominal pain: Secondary | ICD-10-CM | POA: Insufficient documentation

## 2022-07-01 DIAGNOSIS — R0789 Other chest pain: Secondary | ICD-10-CM | POA: Diagnosis not present

## 2022-07-01 MED ORDER — MELOXICAM 15 MG PO TABS: 15.0000 mg | ORAL_TABLET | Freq: Every day | ORAL | 0 refills | Status: DC

## 2022-07-01 NOTE — Progress Notes (Signed)
Kelly Perkins is a 72 y.o. female with the following history as recorded in [employer]:  Patient Active Problem List   Diagnosis Date Noted   Body mass index 40.0-44.9, adult 12/15/2017   Morbid obesity 07/03/2017   S/P TKR (total knee replacement), right 11/17/2016   PCP NOTES >>>>>>>>>>> 06/22/2015   Annual physical exam 06/09/2014   GERD (gastroesophageal reflux disease) 06/09/2014   Hyperlipidemia 09/10/2010   Anemia 08/11/2010   HTN (hypertension) 02/27/2008   ECHOCARDIOGRAM, ABNORMAL 02/27/2008   Hypothyroidism 04/13/2007   INSOMNIA, CHRONIC 04/13/2007   Osteoarthritis 04/13/2007    Current Outpatient Medications  Medication Sig Dispense Refill   cyclobenzaprine (FLEXERIL) 10 MG tablet Take 1 tablet (10 mg total) by mouth at bedtime as needed for muscle spasms. 90 tablet 1   dicyclomine (BENTYL) 10 MG capsule Take 1 capsule (10 mg total) by mouth 4 (four) times daily -  before meals and at bedtime. 40 capsule 0   levothyroxine (SYNTHROID) 137 MCG tablet Take 1 tablet (137 mcg total) by mouth daily before breakfast. 90 tablet 1   lisinopril-hydrochlorothiazide (ZESTORETIC) 10-12.5 MG tablet Take 1 tablet by mouth daily. 90 tablet 1   meloxicam (MOBIC) 15 MG tablet Take 1 tablet (15 mg total) by mouth daily. 30 tablet 0   pantoprazole (PROTONIX) 40 MG tablet Take 1 tablet (40 mg total) by mouth daily. 90 tablet 1   simvastatin (ZOCOR) 40 MG tablet Take 1 tablet (40 mg total) by mouth at bedtime. 90 tablet 1   zolpidem (AMBIEN) 10 MG tablet TAKE 1 TABLET (10 MG TOTAL) BY MOUTH AT BEDTIME AS NEEDED. FOR SLEEP 30 tablet 2   No current facility-administered medications for this visit.    Allergies: Hydrocodone bitartrate er  Past Medical History:  Diagnosis Date   GERD (gastroesophageal reflux disease)    on meds   History of kidney stones    Hyperlipidemia 09/10/2010   on meds   Hypertension    on meds   Hypothyroidism    on meds   Normal cardiac stress test 08/2010   had  CP-DOE   Osteoarthritis    generalizd   Wears dentures    top   Wears glasses     Past Surgical History:  Procedure Laterality Date   ABDOMINAL HYSTERECTOMY  2000   B oophoreectomy, d/t DUB   COLONOSCOPY  2017   JP-MAC-5 yr recall-poor prep-normal   CYSTOSCOPY/URETEROSCOPY/HOLMIUM LASER/STENT PLACEMENT Right 11/17/2017   Procedure: CYSTOSCOPY/RIGHT URETEROSCOPY/RIGHT HOLMIUM LASER/STENT PLACEMENT;  Surgeon: Rene Paci, MD;  Location: [facility];  Service: Urology;  Laterality: Right;   JOINT REPLACEMENT     LITHOTRIPSY  2004   urolthiasis    MASS EXCISION  09/12/2011   Procedure: EXCISION MASS;  Surgeon: Louisa Second, MD;  Location: [facility];  Service: Plastics;  Laterality: Left;  left ring finger   POLYPECTOMY     TA 2004   THYROIDECTOMY Left 2006   TOTAL KNEE ARTHROPLASTY Left 11/17/2016   Procedure: LEFT TOTAL KNEE ARTHROPLASTY;  Surgeon: Tarry Kos, MD;  Location: [facility];  Service: Orthopedics;  Laterality: Left;   TOTAL KNEE ARTHROPLASTY Right 12/04/2017   Procedure: RIGHT TOTAL KNEE ARTHROPLASTY;  Surgeon: Tarry Kos, MD;  Location: [facility];  Service: Orthopedics;  Laterality: Right;    Family History  Problem Relation Age of Onset   Hypertension Mother    Coronary artery disease Mother        onset?   Hypertension Sister  Diabetes Neg Hx    Colon cancer Neg Hx    Breast cancer Neg Hx    Stroke Neg Hx    Rectal cancer Neg Hx    Stomach cancer Neg Hx    Colon polyps Neg Hx     Social History   Tobacco Use   Smoking status: Never   Smokeless tobacco: Never  Substance Use Topics   Alcohol use: Not Currently    Alcohol/week: 1.0 standard drink of alcohol    Types: 1 Standard drinks or equivalent per week    Subjective:   Right flank pain x 3 days; worse with certain movements; no associated GI symptoms; notes she had physical exam earlier this week with weight loss provider and EKG/ U/A were normal there; Denies any chest  pain, shortness of breath, blurred vision or headache Did take Mobic and got some relief; appointment today is at 11:30 and patient notes 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  Head: Normocephalic and atraumatic  Eyes: Sclera and conjunctiva clear; pupils round and reactive to light; extraocular movements intact  Ears: External normal; canals clear; tympanic membranes normal  Oropharynx: Pink, supple. No suspicious lesions  Neck: Supple without thyromegaly, adenopathy  Lungs: Respirations unlabored; clear to auscultation bilaterally without wheeze, rales, rhonchi  CVS exam: normal rate and regular rhythm.  Neurologic: Alert and oriented; speech intact; face symmetrical; moves all extremities well; CNII-XII intact without focal deficit   Assessment:  1. Flank pain     Plan:  Patient had cardiac stress test at end of March which was normal; she notes she had EKG, labs and U/A earlier this week with another provider which was reportedly normal; she notes she cannot stay for extended testing today- will update STAT CXR;  Symptoms are more consistent with muscular source based on presentation- update refill for her Mobic and encouraged to use Flexeril twice a day; follow up worse, no better.   No follow-ups on file.  Orders Placed This Encounter  Procedures   DG Chest 2 View    Standing Status:   Future    Number of Occurrences:   1    Standing Expiration Date:   07/01/2023    Order Specific Question:   Reason for Exam (SYMPTOM  OR DIAGNOSIS REQUIRED)    Answer:   atypical chest pain    Order Specific Question:   Preferred imaging location?    Answer:   [facility]    Requested  Prescriptions   Signed Prescriptions Disp Refills   meloxicam (MOBIC) 15 MG tablet 30 tablet 0    Sig: Take 1 tablet (15 mg total) by mouth daily.

## 2022-07-06 DIAGNOSIS — Z6841 Body Mass Index (BMI) 40.0 and over, adult: Secondary | ICD-10-CM | POA: Diagnosis not present

## 2022-07-06 DIAGNOSIS — R7309 Other abnormal glucose: Secondary | ICD-10-CM | POA: Diagnosis not present

## 2022-07-11 ENCOUNTER — Telehealth: Payer: Self-pay | Admitting: Internal Medicine

## 2022-07-12 NOTE — Telephone Encounter (Signed)
Pdmp ok, rx sent  ? ?

## 2022-07-12 NOTE — Telephone Encounter (Signed)
Requesting: Ambien 10mg   Contract: 03/23/22 UDS: Ambien only Last Visit: 03/15/22 Next Visit: 07/22/22 Last Refill: 04/11/22 #30 and 2RF   Please Advise

## 2022-07-22 ENCOUNTER — Ambulatory Visit (INDEPENDENT_AMBULATORY_CARE_PROVIDER_SITE_OTHER): Payer: Medicare HMO | Admitting: Internal Medicine

## 2022-07-22 ENCOUNTER — Ambulatory Visit (HOSPITAL_BASED_OUTPATIENT_CLINIC_OR_DEPARTMENT_OTHER)
Admission: RE | Admit: 2022-07-22 | Discharge: 2022-07-22 | Disposition: A | Discharge status: Home / Self Care | Payer: Medicare HMO | Source: Ambulatory Visit | Attending: Internal Medicine | Admitting: Internal Medicine

## 2022-07-22 VITALS — BP 138/80 | HR 83 | Temp 97.7°F | Resp 18 | Ht 63.0 in | Wt 266.1 lb

## 2022-07-22 DIAGNOSIS — E785 Hyperlipidemia, unspecified: Secondary | ICD-10-CM

## 2022-07-22 DIAGNOSIS — R109 Unspecified abdominal pain: Secondary | ICD-10-CM | POA: Diagnosis not present

## 2022-07-22 DIAGNOSIS — I10 Essential (primary) hypertension: Secondary | ICD-10-CM | POA: Diagnosis not present

## 2022-07-22 DIAGNOSIS — R8271 Bacteriuria: Secondary | ICD-10-CM | POA: Diagnosis not present

## 2022-07-22 DIAGNOSIS — N200 Calculus of kidney: Secondary | ICD-10-CM | POA: Diagnosis not present

## 2022-07-22 LAB — URINALYSIS, ROUTINE W REFLEX MICROSCOPIC
Bilirubin Urine: NEGATIVE
Hgb urine dipstick: NEGATIVE
Ketones, ur: NEGATIVE
Nitrite: NEGATIVE
Specific Gravity, Urine: 1.01 (ref 1.000–1.030)
Total Protein, Urine: NEGATIVE
Urine Glucose: NEGATIVE
Urobilinogen, UA: 1 (ref 0.0–1.0)
pH: 7 (ref 5.0–8.0)

## 2022-07-22 LAB — CBC WITH DIFFERENTIAL/PLATELET
Basophils Absolute: 0.1 10*3/uL (ref 0.0–0.1)
Basophils Relative: 0.9 % (ref 0.0–3.0)
Eosinophils Absolute: 0.4 10*3/uL (ref 0.0–0.7)
Eosinophils Relative: 5.7 % — ABNORMAL HIGH (ref 0.0–5.0)
HCT: 33.9 % — ABNORMAL LOW (ref 36.0–46.0)
Hemoglobin: 11.4 g/dL — ABNORMAL LOW (ref 12.0–15.0)
Lymphocytes Relative: 31 % (ref 12.0–46.0)
Lymphs Abs: 2 10*3/uL (ref 0.7–4.0)
MCHC: 33.8 g/dL (ref 30.0–36.0)
MCV: 92.9 fl (ref 78.0–100.0)
Monocytes Absolute: 0.7 10*3/uL (ref 0.1–1.0)
Monocytes Relative: 10.5 % (ref 3.0–12.0)
Neutro Abs: 3.3 10*3/uL (ref 1.4–7.7)
Neutrophils Relative %: 51.9 % (ref 43.0–77.0)
Platelets: 192 10*3/uL (ref 150.0–400.0)
RBC: 3.65 Mil/uL — ABNORMAL LOW (ref 3.87–5.11)
RDW: 14.2 % (ref 11.5–15.5)
WBC: 6.3 10*3/uL (ref 4.0–10.5)

## 2022-07-22 LAB — BASIC METABOLIC PANEL
BUN: 21 mg/dL (ref 6–23)
CO2: 30 mEq/L (ref 19–32)
Calcium: 9.3 mg/dL (ref 8.4–10.5)
Chloride: 101 mEq/L (ref 96–112)
Creatinine, Ser: 0.9 mg/dL (ref 0.40–1.20)
GFR: 63.36 mL/min (ref >60.00)
Glucose, Bld: 105 mg/dL — ABNORMAL HIGH (ref 70–99)
Potassium: 3.9 mEq/L (ref 3.5–5.1)
Sodium: 140 mEq/L (ref 135–145)

## 2022-07-22 LAB — AST: AST: 22 U/L (ref 0–37)

## 2022-07-22 LAB — LIPID PANEL
Cholesterol: 158 mg/dL (ref 0–200)
HDL: 45.9 mg/dL (ref >39.00)
LDL Cholesterol: 86 mg/dL (ref 0–99)
NonHDL: 112.23
Total CHOL/HDL Ratio: 3
Triglycerides: 130 mg/dL (ref 0.0–149.0)
VLDL: 26 mg/dL (ref 0.0–40.0)

## 2022-07-22 LAB — ALT: ALT: 20 U/L (ref 0–35)

## 2022-07-22 NOTE — Patient Instructions (Addendum)
Tylenol  500 mg OTC 2 tabs a day every 8 hours as needed for pain   Check the  blood pressure regularly BP GOAL is between 110/65 and  135/85. If it is consistently higher or lower, let me know    GO TO THE LAB : Get the blood work     GO TO THE FRONT DESK, PLEASE SCHEDULE YOUR APPOINTMENTS Come back for a checkup in 3 months  STOP BY THE FIRST FLOOR:  get the XR     Vaccines I recommend: Covid booster RSV vaccine

## 2022-07-22 NOTE — Progress Notes (Signed)
Subjective:    Patient ID: Kelly Perkins, female    DOB: 06-Mar-1951, 72 y.o.   MRN: [id number]  DOS:  07/22/2022 Type of visit - description: f/u  Here for a routine visit however the main focus of the visit was right flank pain. This started approximately April 12. Location is right flank, worse when she twists in certain ways or when she elevated her right arm. Denies any injury or fall. No rash No nausea vomiting. No abdominal pain, no blood in the stools or blood in the urine. Taking meloxicam and Flexeril without much help.  Seen at this office 07/01/2022, chest x-ray was nonacute.  Denies SS CP and DOE.  Review of Systems See above   Past Medical History:  Diagnosis Date   GERD (gastroesophageal reflux disease)    on meds   History of kidney stones    Hyperlipidemia 09/10/2010   on meds   Hypertension    on meds   Hypothyroidism    on meds   Normal cardiac stress test 08/2010   had CP-DOE   Osteoarthritis    generalizd   Wears dentures    top   Wears glasses     Past Surgical History:  Procedure Laterality Date   ABDOMINAL HYSTERECTOMY  2000   B oophoreectomy, d/t DUB   COLONOSCOPY  2017   JP-MAC-5 yr recall-poor prep-normal   CYSTOSCOPY/URETEROSCOPY/HOLMIUM LASER/STENT PLACEMENT Right 11/17/2017   Procedure: CYSTOSCOPY/RIGHT URETEROSCOPY/RIGHT HOLMIUM LASER/STENT PLACEMENT;  Surgeon: Rene Paci, MD;  Location: [facility];  Service: Urology;  Laterality: Right;   JOINT REPLACEMENT     LITHOTRIPSY  2004   urolthiasis    MASS EXCISION  09/12/2011   Procedure: EXCISION MASS;  Surgeon: Louisa Second, MD;  Location: [facility];  Service: Plastics;  Laterality: Left;  left ring finger   POLYPECTOMY     TA 2004   THYROIDECTOMY Left 2006   TOTAL KNEE ARTHROPLASTY Left 11/17/2016   Procedure: LEFT TOTAL KNEE ARTHROPLASTY;  Surgeon: Tarry Kos, MD;  Location: [facility];  Service: Orthopedics;  Laterality: Left;   TOTAL KNEE  ARTHROPLASTY Right 12/04/2017   Procedure: RIGHT TOTAL KNEE ARTHROPLASTY;  Surgeon: Tarry Kos, MD;  Location: [facility];  Service: Orthopedics;  Laterality: Right;    Current Outpatient Medications  Medication Instructions   cyclobenzaprine (FLEXERIL) 10 mg, Oral, At bedtime PRN   dicyclomine (BENTYL) 10 mg, Oral, 3 times daily before meals & bedtime   levothyroxine (SYNTHROID) 137 mcg, Oral, Daily before breakfast   lisinopril-hydrochlorothiazide (ZESTORETIC) 10-12.5 MG tablet 1 tablet, Oral, Daily   meloxicam (MOBIC) 15 mg, Oral, Daily   pantoprazole (PROTONIX) 40 mg, Oral, Daily   simvastatin (ZOCOR) 40 mg, Oral, Daily at bedtime   zolpidem (AMBIEN) 10 mg, Oral, At bedtime PRN, for sleep       Objective:   Physical Exam BP (!) 146/84   Pulse 83   Temp 97.7 F (36.5 C) (Oral)   Resp 18   Ht 5\' 3"  (1.6 m)   Wt 266 lb 2 oz (120.7 kg)   SpO2 96%   BMI 47.14 kg/m  General:   Well developed, NAD, BMI noted.  HEENT:  Normocephalic . Face symmetric, atraumatic Lungs:  CTA B Normal respiratory effort, no intercostal retractions, no accessory muscle use. Heart: RRR,  no murmur.  Abdomen:  Not distended, soft, non-tender. No rebound or rigidity. MSK: No TTP at the thoracolumbar spine. Skin: Not pale. Not jaundice Lower extremities: no  pretibial edema bilaterally  Neurologic:  alert & oriented X3.  Speech normal, gait appropriate for age and unassisted. Transferring: Patient reports pain when she got on the examining table.   Psych--  Cognition and judgment appear intact.  Cooperative with normal attention span and concentration.  Behavior appropriate. No anxious or depressed appearing.     Assessment     ASSESSMENT HTN Hyperlipidemia Insomnia-- ambien Hypothyroidism, s/p thyroidectomy Morbid obesity GERD Mild anemia, normal iron 12-2013, cscope 08-2015 DJD  h/o kidney stones  CP-DOE: Normal stress test 08/2010, Normal stress test 06/07/2022   PLAN: Flank  pain: Seems to be MSK in nature.  Will get x-ray of the thoracic spine.  For completeness we will get a UA urine culture. Meloxicam and Flexeril not working.  Recommend to stop. Intolerant to hydrocodone. Plan: Orthopedic referral, Tylenol.    Addendum, declined  Ortho referral thus a agreed on Tylenol, heating pad and call in 2 weeks if not better. HTN: Seems well-controlled, continue Zestoretic.  Check a BMP and CBC High cholesterol: Based on last FLP, simvastatin was increased from 20 to 40 mg.  AST ALT FLP. CP, DOE: See last visit, Myoview negative, symptoms resolved RTC 3 months

## 2022-07-22 NOTE — Assessment & Plan Note (Signed)
Flank pain: Seems to be MSK in nature.  Will get x-ray of the thoracic spine.  For completeness we will get a UA urine culture. Meloxicam and Flexeril not working.  Recommend to stop. Intolerant to hydrocodone. Plan: Orthopedic referral, Tylenol.    Addendum, declined  Ortho referral thus a agreed on Tylenol, heating pad and call in 2 weeks if not better. HTN: Seems well-controlled, continue Zestoretic.  Check a BMP and CBC High cholesterol: Based on last FLP, simvastatin was increased from 20 to 40 mg.  AST ALT FLP. CP, DOE: See last visit, Myoview negative, symptoms resolved RTC 3 months

## 2022-07-23 LAB — URINE CULTURE
MICRO NUMBER:: 14940964
Result:: NO GROWTH
SPECIMEN QUALITY:: ADEQUATE

## 2022-07-26 NOTE — Addendum Note (Signed)
Addended byConrad Lake Davis D on: 07/26/2022 04:55 PM   Modules accepted: Orders

## 2022-07-28 ENCOUNTER — Other Ambulatory Visit (INDEPENDENT_AMBULATORY_CARE_PROVIDER_SITE_OTHER): Payer: Medicare HMO

## 2022-07-28 DIAGNOSIS — R8271 Bacteriuria: Secondary | ICD-10-CM

## 2022-07-28 LAB — URINALYSIS, ROUTINE W REFLEX MICROSCOPIC
Bilirubin Urine: NEGATIVE
Hgb urine dipstick: NEGATIVE
Ketones, ur: NEGATIVE
Leukocytes,Ua: NEGATIVE
Nitrite: NEGATIVE
Specific Gravity, Urine: 1.025 (ref 1.000–1.030)
Total Protein, Urine: NEGATIVE
Urine Glucose: NEGATIVE
Urobilinogen, UA: 1 (ref 0.0–1.0)
pH: 6 (ref 5.0–8.0)

## 2022-07-29 LAB — URINE CULTURE
MICRO NUMBER:: 14965603
Result:: NO GROWTH
SPECIMEN QUALITY:: ADEQUATE

## 2022-07-31 ENCOUNTER — Other Ambulatory Visit: Payer: Self-pay | Admitting: Family

## 2022-08-09 ENCOUNTER — Ambulatory Visit: Payer: Medicare HMO

## 2022-08-29 ENCOUNTER — Other Ambulatory Visit: Payer: Self-pay | Admitting: Internal Medicine

## 2022-08-31 ENCOUNTER — Ambulatory Visit (INDEPENDENT_AMBULATORY_CARE_PROVIDER_SITE_OTHER): Payer: Medicare HMO | Admitting: *Deleted

## 2022-08-31 DIAGNOSIS — Z0000 Encounter for general adult medical examination without abnormal findings: Secondary | ICD-10-CM

## 2022-08-31 NOTE — Progress Notes (Signed)
Subjective:   Kelly Perkins is a 72 y.o. female who presents for Medicare Annual (Subsequent) preventive examination.  Visit Complete: Virtual  I connected with  Kelly Perkins on 08/31/22 by a audio enabled telemedicine application and verified that I am speaking with the correct person using two identifiers.  Patient Location: Home  Provider Location: Office/Clinic  I discussed the limitations of evaluation and management by telemedicine. The patient expressed understanding and agreed to proceed.  Patient Medicare AWV questionnaire was completed by the patient on 08/30/22; I have confirmed that all information answered by patient is correct and no changes since this date.  Review of Systems     Cardiac Risk Factors include: advanced age (>23men, >27 women);dyslipidemia;hypertension     Objective:    Today's Vitals   There is no height or weight on file to calculate BMI.     08/31/2022   11:02 AM 10/26/2021    2:07 PM 08/04/2021   11:03 AM 05/10/2021   11:18 AM 12/04/2017    3:11 PM 12/04/2017    8:11 AM 11/23/2017    9:24 AM  Advanced Directives  Does Patient Have a Medical Advance Directive? No No No No  No No  Would patient like information on creating a medical advance directive? No - Patient declined No - Patient declined No - Patient declined  Yes (Inpatient - patient defers creating a medical advance directive at this time) No - Patient declined No - Patient declined    Current Medications (verified) Outpatient Encounter Medications as of 08/31/2022  Medication Sig   cyclobenzaprine (FLEXERIL) 10 MG tablet Take 1 tablet (10 mg total) by mouth at bedtime as needed for muscle spasms.   dicyclomine (BENTYL) 10 MG capsule Take 1 capsule (10 mg total) by mouth 4 (four) times daily -  before meals and at bedtime.   levothyroxine (SYNTHROID) 137 MCG tablet Take 1 tablet (137 mcg total) by mouth daily before breakfast.   lisinopril-hydrochlorothiazide (ZESTORETIC) 10-12.5 MG  tablet Take 1 tablet by mouth daily.   meloxicam (MOBIC) 15 MG tablet Take 1 tablet (15 mg total) by mouth daily as needed for pain.   pantoprazole (PROTONIX) 40 MG tablet Take 1 tablet (40 mg total) by mouth daily.   simvastatin (ZOCOR) 40 MG tablet Take 1 tablet (40 mg total) by mouth at bedtime.   zolpidem (AMBIEN) 10 MG tablet TAKE 1 TABLET (10 MG TOTAL) BY MOUTH AT BEDTIME AS NEEDED FOR SLEEP   No facility-administered encounter medications on file as of 08/31/2022.    Allergies (verified) Hydrocodone bitartrate er   History: Past Medical History:  Diagnosis Date   GERD (gastroesophageal reflux disease)    on meds   History of kidney stones    Hyperlipidemia 09/10/2010   on meds   Hypertension    on meds   Hypothyroidism    on meds   Normal cardiac stress test 08/2010   had CP-DOE   Osteoarthritis    generalizd   Wears dentures    top   Wears glasses    Past Surgical History:  Procedure Laterality Date   ABDOMINAL HYSTERECTOMY  2000   B oophoreectomy, d/t DUB   COLONOSCOPY  2017   JP-MAC-5 yr recall-poor prep-normal   CYSTOSCOPY/URETEROSCOPY/HOLMIUM LASER/STENT PLACEMENT Right 11/17/2017   Procedure: CYSTOSCOPY/RIGHT URETEROSCOPY/RIGHT HOLMIUM LASER/STENT PLACEMENT;  Surgeon: Rene Paci, MD;  Location: [facility];  Service: Urology;  Laterality: Right;   JOINT REPLACEMENT     LITHOTRIPSY  2004  urolthiasis    MASS EXCISION  09/12/2011   Procedure: EXCISION MASS;  Surgeon: Louisa Second, MD;  Location: [facility];  Service: Plastics;  Laterality: Left;  left ring finger   POLYPECTOMY     TA 2004   THYROIDECTOMY Left 2006   TOTAL KNEE ARTHROPLASTY Left 11/17/2016   Procedure: LEFT TOTAL KNEE ARTHROPLASTY;  Surgeon: Tarry Kos, MD;  Location: [facility];  Service: Orthopedics;  Laterality: Left;   TOTAL KNEE ARTHROPLASTY Right 12/04/2017   Procedure: RIGHT TOTAL KNEE ARTHROPLASTY;  Surgeon: Tarry Kos, MD;  Location: [facility];  Service:  Orthopedics;  Laterality: Right;   Family History  Problem Relation Age of Onset   Hypertension Mother    Coronary artery disease Mother        onset?   Hypertension Sister    Diabetes Neg Hx    Colon cancer Neg Hx    Breast cancer Neg Hx    Stroke Neg Hx    Rectal cancer Neg Hx    Stomach cancer Neg Hx    Colon polyps Neg Hx    Social History   Socioeconomic History   Marital status: Divorced    Spouse name: Not on file   Number of children: 2   Years of education: Not on file   Highest education level: GED or equivalent  Occupational History   Occupation: school bus driver. Retired-- Scientist, research (physical sciences)  Tobacco Use   Smoking status: Never   Smokeless tobacco: Never  Vaping Use   Vaping Use: Never used  Substance and Sexual Activity   Alcohol use: Not Currently    Alcohol/week: 1.0 standard drink of alcohol    Types: 1 Standard drinks or equivalent per week   Drug use: Never   Sexual activity: Not Currently  Other Topics Concern   Not on file  Social History Narrative   Single, mom was one of my patients [name].     lives by herself      Right Handed    Lives in a two story home   Social Determinants of Health   Financial Resource Strain: Low Risk  (08/30/2022)   Overall Financial Resource Strain (CARDIA)    Difficulty of Paying Living Expenses: Not hard at all  Food Insecurity: No Food Insecurity (08/30/2022)   Hunger Vital Sign    Worried About Running Out of Food in the Last Year: Never true    Ran Out of Food in the Last Year: Never true  Transportation Needs: No Transportation Needs (08/30/2022)   PRAPARE - Administrator, Civil Service (Medical): No    Lack of Transportation (Non-Medical): No  Physical Activity: Insufficiently Active (08/30/2022)   Exercise Vital Sign    Days of Exercise per Week: 3 days    Minutes of Exercise per Session: 30 min  Stress: No Stress Concern Present (08/30/2022)   [employer] of Occupational Health -  Occupational Stress Questionnaire    Feeling of Stress : Not at all  Social Connections: Moderately Isolated (08/30/2022)   Social Connection and Isolation Panel [NHANES]    Frequency of Communication with Friends and Family: More than three times a week    Frequency of Social Gatherings with Friends and Family: Twice a week    Attends Religious Services: 1 to 4 times per year    Active Member of [employer] or Organizations: Patient declined    Attends Banker Meetings: Never    Marital Status: Divorced  Tobacco Counseling Counseling given: Not Answered   Clinical Intake:  Pre-visit preparation completed: Yes  Pain : No/denies pain  Nutritional Risks: None Diabetes: No  How often do you need to have someone help you when you read instructions, pamphlets, or other written materials from your doctor or pharmacy?: 1 - Never  Interpreter Needed?: No  Information entered by :: Donne Anon, CMA   Activities of Daily Living    08/30/2022   11:41 AM  In your present state of health, do you have any difficulty performing the following activities:  Hearing? 0  Vision? 0  Difficulty concentrating or making decisions? 0  Walking or climbing stairs? 0  Dressing or bathing? 0  Doing errands, shopping? 0  Preparing Food and eating ? N  Using the Toilet? N  In the past six months, have you accidently leaked urine? N  Do you have problems with loss of bowel control? N  Managing your Medications? N  Managing your Finances? N  Housekeeping or managing your Housekeeping? N    Patient Care Team: Wanda Plump, MD as PCP - General Liliane Shi Dorian Furnace, MD as Consulting Physician (Urology) Tarry Kos, MD as Attending Physician (Orthopedic Surgery) Glendale Chard, DO as Consulting Physician (Neurology)  Indicate any recent Medical Services you may have received from other than [facility] providers in the past year (date may be approximate).     Assessment:   This is a  routine wellness examination for [facility].  Hearing/Vision screen No results found.  Dietary issues and exercise activities discussed:     Goals Addressed   None    Depression Screen    08/31/2022   11:05 AM 07/22/2022   10:40 AM 07/01/2022   11:20 AM 03/15/2022    9:59 AM 10/08/2021    8:27 AM 08/04/2021   11:03 AM 06/23/2021    1:11 PM  PHQ 2/9 Scores  PHQ - 2 Score 0 0 0 0 0 0 0  PHQ- 9 Score    0       Fall Risk    08/30/2022   11:41 AM 07/22/2022   10:40 AM 07/01/2022   11:20 AM 03/15/2022    9:59 AM 10/08/2021    8:27 AM  Fall Risk   Falls in the past year? 0 0 0 0 0  Number falls in past yr: 0 0 0 0 0  Injury with Fall? 0 0 0 0 0  Risk for fall due to : No Fall Risks      Follow up Falls evaluation completed Falls evaluation completed Falls evaluation completed Falls evaluation completed Falls evaluation completed    MEDICARE RISK AT HOME:  Medicare Risk at Home - 08/31/22 1103     Any stairs in or around the home? Yes    If so, are there any without handrails? No    Home free of loose throw rugs in walkways, pet beds, electrical cords, etc? Yes    Adequate lighting in your home to reduce risk of falls? Yes    Life alert? No    Use of a cane, walker or w/c? No    Grab bars in the bathroom? Yes    Shower chair or bench in shower? Yes   has a little stool   Elevated toilet seat or a handicapped toilet? Yes   comfort height            TIMED UP AND GO:  Was the test performed?  No  Cognitive Function:        08/31/2022   11:06 AM 08/04/2021   11:07 AM  6CIT Screen  What Year? 0 points 0 points  What month? 0 points 0 points  What time? 0 points 0 points  Count back from 20 0 points 4 points  Months in reverse 0 points 0 points  Repeat phrase 6 points 2 points  Total Score 6 points 6 points    Immunizations Immunization History  Administered Date(s) Administered   Fluad Quad(high Dose 65+) 11/16/2018, 03/15/2022   Influenza Split 12/23/2013,  11/06/2014   Influenza, High Dose Seasonal PF 12/06/2017, 12/16/2020   Influenza-Unspecified 12/11/2015, 11/09/2016, 02/04/2020   Moderna Sars-Covid-2 Vaccination 04/12/2019, 05/10/2019, 01/18/2020   Pneumococcal Conjugate-13 06/22/2015   Pneumococcal Polysaccharide-23 01/03/2014, 11/16/2018   Td 04/13/2007, 07/03/2017   Zoster Recombinat (Shingrix) 02/14/2019, 12/16/2020   Zoster, Live 08/25/2014    TDAP status: Up to date  Flu Vaccine status: Up to date  Pneumococcal vaccine status: Up to date  Covid-19 vaccine status: Information provided on how to obtain vaccines.   Qualifies for Shingles Vaccine? Yes   Zostavax completed Yes   Shingrix Completed?: Yes  Screening Tests Health Maintenance  Topic Date Due   COVID-19 Vaccine (4 - 2023-24 season) 11/12/2021   Medicare Annual Wellness (AWV)  08/05/2022   INFLUENZA VACCINE  10/13/2022   MAMMOGRAM  03/12/2023   Colonoscopy  10/01/2025   DTaP/Tdap/Td (3 - Tdap) 07/04/2027   Pneumonia Vaccine 14+ Years old  Completed   DEXA SCAN  Completed   Hepatitis C Screening  Completed   Zoster Vaccines- Shingrix  Completed   HPV VACCINES  Aged Out    Health Maintenance  Health Maintenance Due  Topic Date Due   COVID-19 Vaccine (4 - 2023-24 season) 11/12/2021   Medicare Annual Wellness (AWV)  08/05/2022    Colorectal cancer screening: Type of screening: Colonoscopy. Completed 10/01/20. Repeat every 5 years  Mammogram status: Completed 03/11/22. Repeat every year  Bone Density status: Completed 03/31/22. Results reflect: Bone density results: NORMAL. Repeat every 2 years.  Lung Cancer Screening: (Low Dose CT Chest recommended if Age 70-80 years, 20 pack-year currently smoking OR have quit w/in 15years.) does not qualify.   Additional Screening:  Hepatitis C Screening: does qualify; Completed 06/24/16  Vision Screening: Recommended annual ophthalmology exams for early detection of glaucoma and other disorders of the eye. Is the  patient up to date with their annual eye exam?  Yes  Who is the provider or what is the name of the office in which the patient attends annual eye exams? [facility] If pt is not established with a provider, would they like to be referred to a provider to establish care? No .   Dental Screening: Recommended annual dental exams for proper oral hygiene  Diabetic Foot Exam: N/a  Community Resource Referral / Chronic Care Management: CRR required this visit?  No   CCM required this visit?  No     Plan:     I have personally reviewed and noted the following in the patient's chart:   Medical and social history Use of alcohol, tobacco or illicit drugs  Current medications and supplements including opioid prescriptions. Patient is not currently taking opioid prescriptions. Functional ability and status Nutritional status Physical activity Advanced directives List of other physicians Hospitalizations, surgeries, and ER visits in previous 12 months Vitals Screenings to include cognitive, depression, and falls Referrals and appointments  In addition, I have reviewed and discussed with  patient certain preventive protocols, quality metrics, and best practice recommendations. A written personalized care plan for preventive services as well as general preventive health recommendations were provided to patient.     Donne Anon, CMA   08/31/2022   After Visit Summary: (MyChart) Due to this being a telephonic visit, the after visit summary with patients personalized plan was offered to patient via MyChart   Nurse Notes: None

## 2022-08-31 NOTE — Patient Instructions (Signed)
[name] , Thank you for taking time to come for your Medicare Wellness Visit. I appreciate your ongoing commitment to your health goals. Please review the following plan we discussed and let me know if I can assist you in the future.     This is a list of the screening recommended for you and due dates:  Health Maintenance  Topic Date Due   COVID-19 Vaccine (4 - 2023-24 season) 11/12/2021   Flu Shot  10/13/2022   Mammogram  03/12/2023   Medicare Annual Wellness Visit  08/31/2023   Colon Cancer Screening  10/01/2025   DTaP/Tdap/Td vaccine (3 - Tdap) 07/04/2027   Pneumonia Vaccine  Completed   DEXA scan (bone density measurement)  Completed   Hepatitis C Screening  Completed   Zoster (Shingles) Vaccine  Completed   HPV Vaccine  Aged Out    Next appointment: Follow up in one year for your annual wellness visit.   Preventive Care 74 Years and Older, Female Preventive care refers to lifestyle choices and visits with your health care provider that can promote health and wellness. What does preventive care include? A yearly physical exam. This is also called an annual well check. Dental exams once or twice a year. Routine eye exams. Ask your health care provider how often you should have your eyes checked. Personal lifestyle choices, including: Daily care of your teeth and gums. Regular physical activity. Eating a healthy diet. Avoiding tobacco and drug use. Limiting alcohol use. Practicing safe sex. Taking low-dose aspirin every day. Taking vitamin and mineral supplements as recommended by your health care provider. What happens during an annual well check? The services and screenings done by your health care provider during your annual well check will depend on your age, overall health, lifestyle risk factors, and family history of disease. Counseling  Your health care provider may ask you questions about your: Alcohol use. Tobacco use. Drug use. Emotional  well-being. Home and relationship well-being. Sexual activity. Eating habits. History of falls. Memory and ability to understand (cognition). Work and work Astronomer. Reproductive health. Screening  You may have the following tests or measurements: Height, weight, and BMI. Blood pressure. Lipid and cholesterol levels. These may be checked every 5 years, or more frequently if you are over 36 years old. Skin check. Lung cancer screening. You may have this screening every year starting at age 55 if you have a 30-pack-year history of smoking and currently smoke or have quit within the past 15 years. Fecal occult blood test (FOBT) of the stool. You may have this test every year starting at age 39. Flexible sigmoidoscopy or colonoscopy. You may have a sigmoidoscopy every 5 years or a colonoscopy every 10 years starting at age 12. Hepatitis C blood test. Hepatitis B blood test. Sexually transmitted disease (STD) testing. Diabetes screening. This is done by checking your blood sugar (glucose) after you have not eaten for a while (fasting). You may have this done every 1-3 years. Bone density scan. This is done to screen for osteoporosis. You may have this done starting at age 53. Mammogram. This may be done every 1-2 years. Talk to your health care provider about how often you should have regular mammograms. Talk with your health care provider about your test results, treatment options, and if necessary, the need for more tests. Vaccines  Your health care provider may recommend certain vaccines, such as: Influenza vaccine. This is recommended every year. Tetanus, diphtheria, and acellular pertussis (Tdap, Td) vaccine. You may  need a Td booster every 10 years. Zoster vaccine. You may need this after age 12. Pneumococcal 13-valent conjugate (PCV13) vaccine. One dose is recommended after age 68. Pneumococcal polysaccharide (PPSV23) vaccine. One dose is recommended after age 63. Talk to your  health care provider about which screenings and vaccines you need and how often you need them. This information is not intended to replace advice given to you by your health care provider. Make sure you discuss any questions you have with your health care provider. Document Released: 03/27/2015 Document Revised: 11/18/2015 Document Reviewed: 12/30/2014 Elsevier Interactive Patient Education  2017 [employer].  Fall Prevention in the Home Falls can cause injuries. They can happen to people of all ages. There are many things you can do to make your home safe and to help prevent falls. What can I do on the outside of my home? Regularly fix the edges of walkways and driveways and fix any cracks. Remove anything that might make you trip as you walk through a door, such as a raised step or threshold. Trim any bushes or trees on the path to your home. Use bright outdoor lighting. Clear any walking paths of anything that might make someone trip, such as rocks or tools. Regularly check to see if handrails are loose or broken. Make sure that both sides of any steps have handrails. Any raised decks and porches should have guardrails on the edges. Have any leaves, snow, or ice cleared regularly. Use sand or salt on walking paths during winter. Clean up any spills in your garage right away. This includes oil or grease spills. What can I do in the bathroom? Use night lights. Install grab bars by the toilet and in the tub and shower. Do not use towel bars as grab bars. Use non-skid mats or decals in the tub or shower. If you need to sit down in the shower, use a plastic, non-slip stool. Keep the floor dry. Clean up any water that spills on the floor as soon as it happens. Remove soap buildup in the tub or shower regularly. Attach bath mats securely with double-sided non-slip rug tape. Do not have throw rugs and other things on the floor that can make you trip. What can I do in the bedroom? Use night  lights. Make sure that you have a light by your bed that is easy to reach. Do not use any sheets or blankets that are too big for your bed. They should not hang down onto the floor. Have a firm chair that has side arms. You can use this for support while you get dressed. Do not have throw rugs and other things on the floor that can make you trip. What can I do in the kitchen? Clean up any spills right away. Avoid walking on wet floors. Keep items that you use a lot in easy-to-reach places. If you need to reach something above you, use a strong step stool that has a grab bar. Keep electrical cords out of the way. Do not use floor polish or wax that makes floors slippery. If you must use wax, use non-skid floor wax. Do not have throw rugs and other things on the floor that can make you trip. What can I do with my stairs? Do not leave any items on the stairs. Make sure that there are handrails on both sides of the stairs and use them. Fix handrails that are broken or loose. Make sure that handrails are as long as the stairways.  Check any carpeting to make sure that it is firmly attached to the stairs. Fix any carpet that is loose or worn. Avoid having throw rugs at the top or bottom of the stairs. If you do have throw rugs, attach them to the floor with carpet tape. Make sure that you have a light switch at the top of the stairs and the bottom of the stairs. If you do not have them, ask someone to add them for you. What else can I do to help prevent falls? Wear shoes that: Do not have high heels. Have rubber bottoms. Are comfortable and fit you well. Are closed at the toe. Do not wear sandals. If you use a stepladder: Make sure that it is fully opened. Do not climb a closed stepladder. Make sure that both sides of the stepladder are locked into place. Ask someone to hold it for you, if possible. Clearly mark and make sure that you can see: Any grab bars or handrails. First and last  steps. Where the edge of each step is. Use tools that help you move around (mobility aids) if they are needed. These include: Canes. Walkers. Scooters. Crutches. Turn on the lights when you go into a dark area. Replace any light bulbs as soon as they burn out. Set up your furniture so you have a clear path. Avoid moving your furniture around. If any of your floors are uneven, fix them. If there are any pets around you, be aware of where they are. Review your medicines with your doctor. Some medicines can make you feel dizzy. This can increase your chance of falling. Ask your doctor what other things that you can do to help prevent falls. This information is not intended to replace advice given to you by your health care provider. Make sure you discuss any questions you have with your health care provider. Document Released: 12/25/2008 Document Revised: 08/06/2015 Document Reviewed: 04/04/2014 Elsevier Interactive Patient Education  2017 [employer].

## 2022-09-17 ENCOUNTER — Other Ambulatory Visit: Payer: Self-pay | Admitting: Internal Medicine

## 2022-09-19 DIAGNOSIS — Z6841 Body Mass Index (BMI) 40.0 and over, adult: Secondary | ICD-10-CM | POA: Diagnosis not present

## 2022-09-19 DIAGNOSIS — R0602 Shortness of breath: Secondary | ICD-10-CM | POA: Diagnosis not present

## 2022-09-19 DIAGNOSIS — E663 Overweight: Secondary | ICD-10-CM | POA: Diagnosis not present

## 2022-10-06 DIAGNOSIS — H2513 Age-related nuclear cataract, bilateral: Secondary | ICD-10-CM | POA: Diagnosis not present

## 2022-10-12 ENCOUNTER — Telehealth: Payer: Self-pay | Admitting: Internal Medicine

## 2022-10-12 NOTE — Telephone Encounter (Signed)
PDMP okay, Rx sent 

## 2022-10-12 NOTE — Telephone Encounter (Signed)
Requesting: Ambien 10mg   Contract: 08/05/22 UDS: None Last Visit: 07/22/22 Next Visit: 10/24/22  Last Refill: 07/12/22 #30 and 2RF   Please Advise

## 2022-10-24 ENCOUNTER — Ambulatory Visit (INDEPENDENT_AMBULATORY_CARE_PROVIDER_SITE_OTHER): Payer: Medicare HMO | Admitting: Internal Medicine

## 2022-10-24 ENCOUNTER — Encounter: Payer: Self-pay | Admitting: Internal Medicine

## 2022-10-24 VITALS — BP 136/86 | HR 69 | Temp 97.6°F | Resp 18 | Ht 63.0 in | Wt 267.1 lb

## 2022-10-24 DIAGNOSIS — R202 Paresthesia of skin: Secondary | ICD-10-CM | POA: Diagnosis not present

## 2022-10-24 DIAGNOSIS — R200 Anesthesia of skin: Secondary | ICD-10-CM | POA: Diagnosis not present

## 2022-10-24 DIAGNOSIS — M79672 Pain in left foot: Secondary | ICD-10-CM

## 2022-10-24 NOTE — Assessment & Plan Note (Signed)
Flank pain: see LOV. Resolved Left hand numbness, right arm numbness: We talk about possibly doing further evaluation versus use a CTS splinter at the left hand and observation.  She elected this option.  Will get a OTC CTS brace and let me know if not better. Left plantar pain: Stepped on a piece of glass 2 weeks ago, still hurting sometimes with walking.  On exam he has dark, 3 to 4 mm area of induration.  inner wart?  FB?  Others?  Refer to podiatry for possible excision   Obesity: Likely to discuss pharmacological treatment, injectables are likely very expensive, she could qualify for Contrave, information provided.   We did talk about a healthy diet. Other medical problems seem stable. Vaccine advice provided RTC 03-2023 CPX.

## 2022-10-24 NOTE — Patient Instructions (Addendum)
Vaccines I recommend: Covid booster Flu shot this fall  Check the  blood pressure regularly Blood pressure goal:  between 110/65 and  135/85. If it is consistently higher or lower, let me know  For the numbness at the left hand: Get a over-the-counter carpal tunnel syndrome splint check, use it every 9, call if not gradually better   Read more about the medication called Contrave, may help you to lose weight  Watch your diet closely, eat less carbohydrates (bread, pasta, potato, sweet tea, cake) and instead eat more fruits, vegetables and lean meats.    GO TO THE FRONT DESK, PLEASE SCHEDULE YOUR APPOINTMENTS Come back for a physical exam by January 2025    Please read:  This is a summary of the advice provided today.  Please read if you have questions about today's visit.  You can always call us back for more information.  If you have MyChart, please use it. Let the APP send you alerts and  reminders.   If you are not planning to use MyChart, please deactivate it.  If you have it and don't use it, you won't see my comments about your results.  If I order blood work, x-rays or referrals and you do not see your results or don't hear about the referrals within a week:  please call my office.

## 2022-10-24 NOTE — Progress Notes (Signed)
Subjective:    Patient ID: Kelly Perkins, female    DOB: [birth year], 72 y.o.   MRN: [id number]  DOS:  10/24/2022 Type of visit - description: Follow-up  Discussed  several issues.  See LOV, flank pain resolved.  Has occasional numbness at the right arm, denies neck pain or weakness.  Also occasional numbness at the left fingers, typically the second third and fourth fingers. Denies elbow or shoulder pains.  Likes to discuss weight loss management  About 2 weeks ago she stepped on a glass, still have some pain on the left plantar area.   Review of Systems See above   Past Medical History:  Diagnosis Date   GERD (gastroesophageal reflux disease)    on meds   History of kidney stones    Hyperlipidemia 09/10/2010   on meds   Hypertension    on meds   Hypothyroidism    on meds   Normal cardiac stress test 08/2010   had CP-DOE   Osteoarthritis    generalizd   Wears dentures    top   Wears glasses     Past Surgical History:  Procedure Laterality Date   ABDOMINAL HYSTERECTOMY  2000   B oophoreectomy, d/t DUB   COLONOSCOPY  2017   JP-MAC-5 yr recall-poor prep-normal   CYSTOSCOPY/URETEROSCOPY/HOLMIUM LASER/STENT PLACEMENT Right 11/17/2017   Procedure: CYSTOSCOPY/RIGHT URETEROSCOPY/RIGHT HOLMIUM LASER/STENT PLACEMENT;  Surgeon: Rene Paci, MD;  Location: [facility];  Service: Urology;  Laterality: Right;   JOINT REPLACEMENT     LITHOTRIPSY  2004   urolthiasis    MASS EXCISION  09/12/2011   Procedure: EXCISION MASS;  Surgeon: Louisa Second, MD;  Location: [facility];  Service: Plastics;  Laterality: Left;  left ring finger   POLYPECTOMY     TA 2004   THYROIDECTOMY Left 2006   TOTAL KNEE ARTHROPLASTY Left 11/17/2016   Procedure: LEFT TOTAL KNEE ARTHROPLASTY;  Surgeon: Tarry Kos, MD;  Location: [facility];  Service: Orthopedics;  Laterality: Left;   TOTAL KNEE ARTHROPLASTY Right 12/04/2017   Procedure: RIGHT TOTAL KNEE ARTHROPLASTY;   Surgeon: Tarry Kos, MD;  Location: [facility];  Service: Orthopedics;  Laterality: Right;    Current Outpatient Medications  Medication Instructions   cyclobenzaprine (FLEXERIL) 10 mg, Oral, At bedtime PRN   dicyclomine (BENTYL) 10 mg, Oral, 3 times daily before meals & bedtime   levothyroxine (SYNTHROID) 137 mcg, Oral, Daily before breakfast   lisinopril-hydrochlorothiazide (ZESTORETIC) 10-12.5 MG tablet 1 tablet, Oral, Daily   meloxicam (MOBIC) 15 mg, Oral, Daily PRN   pantoprazole (PROTONIX) 40 mg, Oral, Daily   simvastatin (ZOCOR) 40 mg, Oral, Daily at bedtime   zolpidem (AMBIEN) 10 mg, Oral, At bedtime PRN, for sleep       Objective:   Physical Exam BP 136/86   Pulse 69   Temp 97.6 F (36.4 C) (Oral)   Resp 18   Ht 5\' 3"  (1.6 m)   Wt 267 lb 2 oz (121.2 kg)   SpO2 95%   BMI 47.32 kg/m  General:   Well developed, NAD, BMI noted. HEENT:  Normocephalic . Face symmetric, atraumatic Neck: No TTP at the cervical spine. Lungs:  CTA B Normal respiratory effort, no intercostal retractions, no accessory muscle use. Heart: RRR,  no murmur.  Lower extremities: no pretibial edema bilaterally  Skin: Left plantar area, laterally, has a 3 mm hyperpigmented spot which is a little harder.  Neurologic:  alert & oriented X3.  Speech normal, gait  appropriate for age and unassisted. Motor and DTR symmetric (knees not tested) Psych--  Cognition and judgment appear intact.  Cooperative with normal attention span and concentration.  Behavior appropriate. No anxious or depressed appearing.      Assessment    ASSESSMENT HTN Hyperlipidemia Insomnia-- ambien Hypothyroidism, s/p thyroidectomy Morbid obesity GERD Mild anemia, normal iron 12-2013, cscope 08-2015 DJD  h/o kidney stones  CP-DOE: Normal stress test 08/2010, Normal stress test 06/07/2022   PLAN: Flank pain: see LOV. Resolved Left hand numbness, right arm numbness: We talk about possibly doing further evaluation versus  use a CTS splinter at the left hand and observation.  She elected this option.  Will get a OTC CTS brace and let me know if not better. Left plantar pain: Stepped on a piece of glass 2 weeks ago, still hurting sometimes with walking.  On exam he has dark, 3 to 4 mm area of induration.  inner wart?  FB?  Others?  Refer to podiatry for possible excision   Obesity: Likely to discuss pharmacological treatment, injectables are likely very expensive, she could qualify for Contrave, information provided.   We did talk about a healthy diet. Other medical problems seem stable. Vaccine advice provided RTC 03-2023 CPX.

## 2022-10-25 ENCOUNTER — Telehealth: Payer: Self-pay | Admitting: Internal Medicine

## 2022-10-25 NOTE — Telephone Encounter (Signed)
Pt states she would like to go on the weight loss medication pcp recommended. She does not remember the name.

## 2022-10-26 MED ORDER — CONTRAVE 8-90 MG PO TB12: ORAL_TABLET | ORAL | 0 refills | Status: DC

## 2022-10-26 NOTE — Telephone Encounter (Signed)
Contrave discussed at OV on 10/24/22, patient requesting prescription.

## 2022-10-26 NOTE — Telephone Encounter (Signed)
Advise patient: I sent a prescription to the pharmacy. Is a starting package, we will start with 1 tablet a day and gradually increase the dose. Let us know if she has side effects call in 1 month to let us know how she is doing

## 2022-10-26 NOTE — Telephone Encounter (Signed)
Spoke with patient who states medication is over $600 which she cannot afford.  Advised to visit website for potential coupon and would be on the lookout for PA.

## 2022-10-31 ENCOUNTER — Other Ambulatory Visit: Payer: Self-pay | Admitting: Internal Medicine

## 2022-10-31 ENCOUNTER — Other Ambulatory Visit (HOSPITAL_COMMUNITY): Payer: Self-pay

## 2022-10-31 NOTE — Telephone Encounter (Signed)
FYIFrancine Perkins doesn't cover wt loss medications.

## 2022-10-31 NOTE — Telephone Encounter (Signed)
Earnest Conroy, CPhT  You20 minutes ago (12:22 PM)    It looks like no PA is required.  I check to see if Patient qualified for copay card but she does not qualify due to her having [employer] and there is no PAP. Patient may need another treatment option. Thanks.

## 2022-10-31 NOTE — Telephone Encounter (Signed)
Let the patient know.

## 2022-11-01 NOTE — Telephone Encounter (Signed)
Mychart message sent.

## 2022-11-03 ENCOUNTER — Ambulatory Visit: Payer: Medicare HMO | Admitting: Podiatry

## 2022-11-03 ENCOUNTER — Ambulatory Visit (INDEPENDENT_AMBULATORY_CARE_PROVIDER_SITE_OTHER): Payer: Medicare HMO

## 2022-11-03 DIAGNOSIS — M779 Enthesopathy, unspecified: Secondary | ICD-10-CM

## 2022-11-03 DIAGNOSIS — S90852A Superficial foreign body, left foot, initial encounter: Secondary | ICD-10-CM

## 2022-11-03 NOTE — Patient Instructions (Signed)

## 2022-11-03 NOTE — Progress Notes (Signed)
Subjective:   Patient ID: Kelly Perkins, female   DOB: 72 y.o.   MRN: [id number]   HPI Chief Complaint  Patient presents with   Foot Pain    Patient believes she stepped on a piece of glass about a month ago. She has a dark spot to the left arch and she will have some pain to that specific area. She is not diabetic.    72 year old female presents the office today with above concerns.  She states that she thought all the glass came out but she is obviously tenderness to the area.  No treatment.  No drainage or pus that she reports.  It is still sore mostly with pressure.  [place] up todate  Review of Systems  All other systems reviewed and are negative.  Past Medical History:  Diagnosis Date   GERD (gastroesophageal reflux disease)    on meds   History of kidney stones    Hyperlipidemia 09/10/2010   on meds   Hypertension    on meds   Hypothyroidism    on meds   Normal cardiac stress test 08/2010   had CP-DOE   Osteoarthritis    generalizd   Wears dentures    top   Wears glasses     Past Surgical History:  Procedure Laterality Date   ABDOMINAL HYSTERECTOMY  2000   B oophoreectomy, d/t DUB   COLONOSCOPY  2017   JP-MAC-5 yr recall-poor prep-normal   CYSTOSCOPY/URETEROSCOPY/HOLMIUM LASER/STENT PLACEMENT Right 11/17/2017   Procedure: CYSTOSCOPY/RIGHT URETEROSCOPY/RIGHT HOLMIUM LASER/STENT PLACEMENT;  Surgeon: Rene Paci, MD;  Location: [facility];  Service: Urology;  Laterality: Right;   JOINT REPLACEMENT     LITHOTRIPSY  2004   urolthiasis    MASS EXCISION  09/12/2011   Procedure: EXCISION MASS;  Surgeon: Louisa Second, MD;  Location: [facility];  Service: Plastics;  Laterality: Left;  left ring finger   POLYPECTOMY     TA 2004   THYROIDECTOMY Left 2006   TOTAL KNEE ARTHROPLASTY Left 11/17/2016   Procedure: LEFT TOTAL KNEE ARTHROPLASTY;  Surgeon: Tarry Kos, MD;  Location: [facility];  Service: Orthopedics;  Laterality: Left;   TOTAL KNEE  ARTHROPLASTY Right 12/04/2017   Procedure: RIGHT TOTAL KNEE ARTHROPLASTY;  Surgeon: Tarry Kos, MD;  Location: [facility];  Service: Orthopedics;  Laterality: Right;     Current Outpatient Medications:    meloxicam (MOBIC) 15 MG tablet, Take 1 tablet (15 mg total) by mouth daily as needed for pain., Disp: 30 tablet, Rfl: 1   cyclobenzaprine (FLEXERIL) 10 MG tablet, Take 1 tablet (10 mg total) by mouth at bedtime as needed for muscle spasms., Disp: 90 tablet, Rfl: 1   dicyclomine (BENTYL) 10 MG capsule, Take 1 capsule (10 mg total) by mouth 4 (four) times daily -  before meals and at bedtime., Disp: 40 capsule, Rfl: 0   levothyroxine (SYNTHROID) 137 MCG tablet, Take 1 tablet (137 mcg total) by mouth daily before breakfast., Disp: 90 tablet, Rfl: 1   lisinopril-hydrochlorothiazide (ZESTORETIC) 10-12.5 MG tablet, Take 1 tablet by mouth daily., Disp: 90 tablet, Rfl: 1   Naltrexone-buPROPion HCl ER (CONTRAVE) 8-90 MG TB12, Start 1 tablet every morning for 7 days, then 1 tablet twice daily for 7 days, then 2 tablets every morning and one every evening, Disp: 120 tablet, Rfl: 0   pantoprazole (PROTONIX) 40 MG tablet, Take 1 tablet (40 mg total) by mouth daily., Disp: 90 tablet, Rfl: 1   simvastatin (ZOCOR) 40 MG  tablet, Take 1 tablet (40 mg total) by mouth at bedtime., Disp: 90 tablet, Rfl: 1   zolpidem (AMBIEN) 10 MG tablet, TAKE 1 TABLET BY MOUTH AT BEDTIME AS NEEDED FOR SLEEP., Disp: 30 tablet, Rfl: 3  Allergies  Allergen Reactions   Hydrocodone Bitartrate Er     Itchy-rash mild           Objective:  Physical Exam  General: AAO x3, NAD  Dermatological: On the plantar aspect of the foot is an area of hyperkeratotic tissue.  There is some dried blood present and upon debridement was able to visualize a small piece of glass.  Is able to debride this without any complications or bleeding and this appeared to be superficial.  Underlying skin intact without any puncture wound after debridement.   There is no fluctuation or crepitation.  Vascular: Dorsalis Pedis artery and Posterior Tibial artery pedal pulses are 2/4 bilateral with immedate capillary fill time. There is no pain with calf compression, swelling, warmth, erythema.   Neruologic: Grossly intact via light touch bilateral.   Musculoskeletal: Tenderness to the skin lesion, foreign body after debridement improved symptoms.     Assessment:   72 year old female with superficial foreign body left foot     Plan:  -Treatment options discussed including all alternatives, risks, and complications -Etiology of symptoms were discussed -X-rays were obtained and reviewed with the patient.  3 views left foot were obtained.  No subacute fracture.  No definitive foreign body noted. -Discharge with the hyperkeratotic lesion to identify the area of the glass.  I was able to remove a small piece of glass.  After debridement there was no open lesion not able to palpate any further foreign object.  I cleaned area with alcohol.  Antibiotic ointment dressing applied.  Discussed if symptoms persist we will need to get advanced imaging to further evaluate for any residual foreign body.  Vivi Barrack DPM

## 2022-11-07 ENCOUNTER — Other Ambulatory Visit: Payer: Self-pay | Admitting: Internal Medicine

## 2022-11-26 ENCOUNTER — Other Ambulatory Visit: Payer: Self-pay | Admitting: Internal Medicine

## 2022-11-26 DIAGNOSIS — E663 Overweight: Secondary | ICD-10-CM | POA: Diagnosis not present

## 2022-11-26 DIAGNOSIS — K219 Gastro-esophageal reflux disease without esophagitis: Secondary | ICD-10-CM

## 2022-11-26 DIAGNOSIS — E538 Deficiency of other specified B group vitamins: Secondary | ICD-10-CM | POA: Diagnosis not present

## 2022-11-26 DIAGNOSIS — Z6841 Body Mass Index (BMI) 40.0 and over, adult: Secondary | ICD-10-CM | POA: Diagnosis not present

## 2022-12-08 ENCOUNTER — Other Ambulatory Visit: Payer: Self-pay | Admitting: Internal Medicine

## 2022-12-31 ENCOUNTER — Other Ambulatory Visit: Payer: Self-pay | Admitting: Internal Medicine

## 2023-01-05 ENCOUNTER — Other Ambulatory Visit: Payer: Self-pay | Admitting: Internal Medicine

## 2023-02-06 ENCOUNTER — Telehealth: Payer: Self-pay | Admitting: Internal Medicine

## 2023-02-07 NOTE — Telephone Encounter (Signed)
PDMP okay, Rx sent 

## 2023-02-07 NOTE — Telephone Encounter (Signed)
Requesting: Ambien 10mg   Contract: 08/05/22 UDS: Ambien only Last Visit: 10/24/22 Next Visit: 03/17/23 Last Refill: 10/12/22 #30 and 3RF  Please Advise

## 2023-03-05 ENCOUNTER — Other Ambulatory Visit: Payer: Self-pay | Admitting: Internal Medicine

## 2023-03-08 ENCOUNTER — Other Ambulatory Visit: Payer: Self-pay | Admitting: Internal Medicine

## 2023-03-13 ENCOUNTER — Other Ambulatory Visit: Payer: Self-pay | Admitting: Internal Medicine

## 2023-03-13 DIAGNOSIS — Z1231 Encounter for screening mammogram for malignant neoplasm of breast: Secondary | ICD-10-CM

## 2023-03-14 ENCOUNTER — Ambulatory Visit
Admission: RE | Admit: 2023-03-14 | Discharge: 2023-03-14 | Disposition: A | Discharge status: Home / Self Care | Payer: Medicare HMO | Source: Ambulatory Visit | Attending: Internal Medicine | Admitting: Internal Medicine

## 2023-03-14 DIAGNOSIS — Z1231 Encounter for screening mammogram for malignant neoplasm of breast: Secondary | ICD-10-CM

## 2023-03-17 ENCOUNTER — Ambulatory Visit (INDEPENDENT_AMBULATORY_CARE_PROVIDER_SITE_OTHER): Payer: Medicare HMO | Admitting: Internal Medicine

## 2023-03-17 ENCOUNTER — Encounter: Payer: Self-pay | Admitting: Internal Medicine

## 2023-03-17 ENCOUNTER — Telehealth: Payer: Self-pay | Admitting: Internal Medicine

## 2023-03-17 ENCOUNTER — Other Ambulatory Visit (HOSPITAL_BASED_OUTPATIENT_CLINIC_OR_DEPARTMENT_OTHER): Payer: Self-pay

## 2023-03-17 VITALS — BP 122/76 | HR 72 | Temp 98.2°F | Resp 18 | Ht 63.0 in | Wt 260.1 lb

## 2023-03-17 DIAGNOSIS — E039 Hypothyroidism, unspecified: Secondary | ICD-10-CM

## 2023-03-17 DIAGNOSIS — I10 Essential (primary) hypertension: Secondary | ICD-10-CM | POA: Diagnosis not present

## 2023-03-17 DIAGNOSIS — E785 Hyperlipidemia, unspecified: Secondary | ICD-10-CM

## 2023-03-17 DIAGNOSIS — M25511 Pain in right shoulder: Secondary | ICD-10-CM

## 2023-03-17 DIAGNOSIS — J9801 Acute bronchospasm: Secondary | ICD-10-CM | POA: Diagnosis not present

## 2023-03-17 DIAGNOSIS — Z0000 Encounter for general adult medical examination without abnormal findings: Secondary | ICD-10-CM

## 2023-03-17 DIAGNOSIS — Z23 Encounter for immunization: Secondary | ICD-10-CM

## 2023-03-17 DIAGNOSIS — Z0001 Encounter for general adult medical examination with abnormal findings: Secondary | ICD-10-CM

## 2023-03-17 LAB — CBC WITH DIFFERENTIAL/PLATELET
Basophils Absolute: 0 10*3/uL (ref 0.0–0.1)
Basophils Relative: 0.8 % (ref 0.0–3.0)
Eosinophils Absolute: 0.2 10*3/uL (ref 0.0–0.7)
Eosinophils Relative: 4.4 % (ref 0.0–5.0)
HCT: 34.6 % — ABNORMAL LOW (ref 36.0–46.0)
Hemoglobin: 11.5 g/dL — ABNORMAL LOW (ref 12.0–15.0)
Lymphocytes Relative: 33.6 % (ref 12.0–46.0)
Lymphs Abs: 1.7 10*3/uL (ref 0.7–4.0)
MCHC: 33.2 g/dL (ref 30.0–36.0)
MCV: 94.2 fL (ref 78.0–100.0)
Monocytes Absolute: 0.8 10*3/uL (ref 0.1–1.0)
Monocytes Relative: 16.4 % — ABNORMAL HIGH (ref 3.0–12.0)
Neutro Abs: 2.3 10*3/uL (ref 1.4–7.7)
Neutrophils Relative %: 44.8 % (ref 43.0–77.0)
Platelets: 217 10*3/uL (ref 150.0–400.0)
RBC: 3.67 Mil/uL — ABNORMAL LOW (ref 3.87–5.11)
RDW: 14 % (ref 11.5–15.5)
WBC: 5.1 10*3/uL (ref 4.0–10.5)

## 2023-03-17 LAB — BASIC METABOLIC PANEL
BUN: 20 mg/dL (ref 6–23)
CO2: 28 meq/L (ref 19–32)
Calcium: 9.3 mg/dL (ref 8.4–10.5)
Chloride: 107 meq/L (ref 96–112)
Creatinine, Ser: 0.9 mg/dL (ref 0.40–1.20)
GFR: 63.07 mL/min (ref >60.00)
Glucose, Bld: 106 mg/dL — ABNORMAL HIGH (ref 70–99)
Potassium: 4.3 meq/L (ref 3.5–5.1)
Sodium: 143 meq/L (ref 135–145)

## 2023-03-17 LAB — TSH: TSH: 0.88 u[IU]/mL (ref 0.35–5.50)

## 2023-03-17 LAB — LIPID PANEL
Cholesterol: 160 mg/dL (ref 0–200)
HDL: 41.8 mg/dL (ref >39.00)
LDL Cholesterol: 103 mg/dL — ABNORMAL HIGH (ref 0–99)
NonHDL: 118.34
Total CHOL/HDL Ratio: 4
Triglycerides: 75 mg/dL (ref 0.0–149.0)
VLDL: 15 mg/dL (ref 0.0–40.0)

## 2023-03-17 MED ORDER — COMIRNATY 30 MCG/0.3ML IM SUSY
0.3000 mL | PREFILLED_SYRINGE | Freq: Once | INTRAMUSCULAR | 0 refills | Status: AC
  Filled 2023-03-17: qty 0.3, 1d supply, fill #0

## 2023-03-17 MED ORDER — ALBUTEROL SULFATE HFA 108 (90 BASE) MCG/ACT IN AERS: 2.0000 | INHALATION_SPRAY | Freq: Four times a day (QID) | RESPIRATORY_TRACT | 1 refills | Status: DC | PRN

## 2023-03-17 MED ORDER — BUDESONIDE-FORMOTEROL FUMARATE 160-4.5 MCG/ACT IN AERO: 2.0000 | INHALATION_SPRAY | Freq: Two times a day (BID) | RESPIRATORY_TRACT | 3 refills | Status: DC

## 2023-03-17 NOTE — Patient Instructions (Addendum)
 Vaccines I recommend: Covid booster  Start Symbicort : 2 puffs twice daily for the next 2 months. If the wheezing continue let me know If cost is very high and you are unable to afford it, let me know.  We are referring you to sports medicine for right shoulder pain Only take 1 meloxicam  a day. You can also take Tylenol   500 mg OTC 2 tabs a day every 8 hours as needed for pain  Please call the healthy weight and wellness office   Check the  blood pressure regularly Blood pressure goal:  between 110/65 and  135/85. If it is consistently higher or lower, let me know     GO TO THE LAB : Get the blood work     Next visit with me in 3 months    Please schedule it at the front desk       Health Care Power of attorney ,  Living will (Advance care planning documents)  If you already have a living will or healthcare power of attorney, is recommended you bring the copy to be scanned in your chart.   The document will be available to all the doctors you see in the system.  Advance care planning is a process that supports adults in  understanding and sharing their preferences regarding future medical care.  The patient's preferences are recorded in documents called Advance Directives and the can be modified at any time while the patient is in full mental capacity.   If you don't have one, please consider create one.      More information at: [email or web address]

## 2023-03-17 NOTE — Addendum Note (Signed)
 Addended by: Wanda Plump on: 03/17/2023 04:39 PM   Modules accepted: Orders

## 2023-03-17 NOTE — Addendum Note (Signed)
 Addended by: Conrad Beltrami D on: 03/17/2023 04:41 PM   Modules accepted: Orders

## 2023-03-17 NOTE — Assessment & Plan Note (Signed)
 Here for CPX -Td 06-2017 - PNM 23: 2015 and 2020 - PNM 13 06-22-15 -s/p Zostavax , shingrix  and RSV - Flu shot today - Recommend a COVID booster if not done recently   --CCS: cscope ~2011, poor prep. Colonoscopy again 08/2015, no biopsies, Cscope 09-2020, 5 years per GI letter --Per guidelines  no further Pap smears --MMG 02/2022 [facility]  , had one last week per pt  --DEXA  2017, DEXA 03-2022: Normal, rec Vit D - Labs: BMP FLP CBC   TSH -Healthcare POA: See AVS -Diet and exercise discuss

## 2023-03-17 NOTE — Telephone Encounter (Signed)
 Pt states the symbicort is too expensive. Please send in something else.

## 2023-03-17 NOTE — Telephone Encounter (Signed)
 Advised patient, due to cost we will switch her albuterol: 2 puffs every 6 hours. Recommend to do inhalers 4 times a day for the next 2 to 3 days then only as needed for wheezing.

## 2023-03-17 NOTE — Assessment & Plan Note (Signed)
 Here for CPX HTN: On Zestoretic , BP is very good, recommend to check ambulatory BPs at home, checking a BMP and CBC Hypothyroidism: On Synthroid , check TSH. High cholesterol: On simvastatin , checking FLP, last LFTs normal. Right shoulder pain: Mostly nocturnal, refer to sports medicine, recommend to take only 1 meloxicam  a day and use Tylenol  as needed. Morbid obesity: Was unable to afford Contrave , information about the wellness center provided. Bronchospasm: No history of asthma but in the past she was prescribed inhaler, she is wheezing for the last few weeks, recommend to use Symbicort  for 2 months, call if not better. Addendum: Symbicort  was expensive, switch to albuterol  RTC 3 months.

## 2023-03-17 NOTE — Telephone Encounter (Signed)
 Tried calling Pt- no answer, unable to leave vm.

## 2023-03-17 NOTE — Progress Notes (Signed)
 Subjective:    Patient ID: Kelly Perkins, female    DOB: 12-Jun-1950, 73 y.o.   MRN: [id number]  DOS:  03/17/2023 Type of visit - description: CPX  Here for CPX.  Chronic medical problems addressed.  Also, reports wheezing, only in the mornings,  started few weeks ago, no wheezing when she lays down. No fever or chills.  No chest pain or difficulty breathing.  No lower extremity edema. She had a cold 2 weeks, still having some mild cough but wheezing proceeded the cold.  Also c/o shoulder pain, mostly at night, for the last 2 months. Denies neck injury or fall. Takes meloxicam  often, ~ 2 tablets daily (1 tablet is not enough).  Review of Systems  Other than above, a 14 point review of systems is negative     Past Medical History:  Diagnosis Date   GERD (gastroesophageal reflux disease)    on meds   History of kidney stones    Hyperlipidemia 09/10/2010   on meds   Hypertension    on meds   Hypothyroidism    on meds   Normal cardiac stress test 08/2010   had CP-DOE   Osteoarthritis    generalizd   Wears dentures    top   Wears glasses     Past Surgical History:  Procedure Laterality Date   ABDOMINAL HYSTERECTOMY  2000   B oophoreectomy, d/t DUB   COLONOSCOPY  2017   JP-MAC-5 yr recall-poor prep-normal   CYSTOSCOPY/URETEROSCOPY/HOLMIUM LASER/STENT PLACEMENT Right 11/17/2017   Procedure: CYSTOSCOPY/RIGHT URETEROSCOPY/RIGHT HOLMIUM LASER/STENT PLACEMENT;  Surgeon: Devere Lonni Righter, MD;  Location: [facility];  Service: Urology;  Laterality: Right;   JOINT REPLACEMENT     LITHOTRIPSY  2004   urolthiasis    MASS EXCISION  09/12/2011   Procedure: EXCISION MASS;  Surgeon: Elna Pick, MD;  Location: [facility];  Service: Plastics;  Laterality: Left;  left ring finger   POLYPECTOMY     TA 2004   THYROIDECTOMY Left 2006   TOTAL KNEE ARTHROPLASTY Left 11/17/2016   Procedure: LEFT TOTAL KNEE ARTHROPLASTY;  Surgeon: Jerri Kay HERO, MD;  Location: [facility];  Service: Orthopedics;  Laterality: Left;   TOTAL KNEE ARTHROPLASTY Right 12/04/2017   Procedure: RIGHT TOTAL KNEE ARTHROPLASTY;  Surgeon: Jerri Kay HERO, MD;  Location: [facility];  Service: Orthopedics;  Laterality: Right;   Social History   Socioeconomic History   Marital status: Divorced    Spouse name: Not on file   Number of children: 2   Years of education: Not on file   Highest education level: GED or equivalent  Occupational History   Occupation: school bus driver. Retired-- Scientist, Research (physical Sciences)  Tobacco Use   Smoking status: Never   Smokeless tobacco: Never  Vaping Use   Vaping status: Never Used  Substance and Sexual Activity   Alcohol  use: Not Currently    Alcohol /week: 1.0 standard drink of alcohol     Types: 1 Standard drinks or equivalent per week   Drug use: Never   Sexual activity: Not Currently  Other Topics Concern   Not on file  Social History Narrative   Single, mom was one of my patients Ms. Ronal Cook.     lives by herself      Right Handed    Lives in a two story home   Social Drivers of Health   Financial Resource Strain: Low Risk  (03/14/2023)   Overall Financial Resource Strain (CARDIA)    Difficulty of  Paying Living Expenses: Not hard at all  Food Insecurity: No Food Insecurity (03/14/2023)   Hunger Vital Sign    Worried About Running Out of Food in the Last Year: Never true    Ran Out of Food in the Last Year: Never true  Transportation Needs: No Transportation Needs (03/14/2023)   PRAPARE - Administrator, Civil Service (Medical): No    Lack of Transportation (Non-Medical): No  Physical Activity: Insufficiently Active (03/14/2023)   Exercise Vital Sign    Days of Exercise per Week: 1 day    Minutes of Exercise per Session: 20 min  Stress: No Stress Concern Present (03/14/2023)   [employer] of Occupational Health - Occupational Stress Questionnaire    Feeling of Stress : Not at all  Social Connections: Socially Isolated (03/14/2023)    Social Connection and Isolation Panel [NHANES]    Frequency of Communication with Friends and Family: Twice a week    Frequency of Social Gatherings with Friends and Family: Once a week    Attends Religious Services: Never    Database Administrator or Organizations: No    Attends Banker Meetings: Never    Marital Status: Divorced  Catering Manager Violence: Not At Risk (08/31/2022)   Humiliation, Afraid, Rape, and Kick questionnaire    Fear of Current or Ex-Partner: No    Emotionally Abused: No    Physically Abused: No    Sexually Abused: No     Current Outpatient Medications  Medication Instructions   albuterol  (VENTOLIN  HFA) 108 (90 Base) MCG/ACT inhaler 2 puffs, Inhalation, Every 6 hours PRN   COVID-19 mRNA vaccine, Pfizer, (COMIRNATY ) syringe 0.3 mLs, Intramuscular,  Once   cyclobenzaprine  (FLEXERIL ) 10 mg, Oral, At bedtime PRN   dicyclomine  (BENTYL ) 10 mg, Oral, 3 times daily before meals & bedtime   levothyroxine  (SYNTHROID ) 137 mcg, Oral, Daily before breakfast   lisinopril -hydrochlorothiazide  (ZESTORETIC ) 10-12.5 MG tablet 1 tablet, Oral, Daily   meloxicam  (MOBIC ) 15 MG tablet TAKE 1 TABLET BY MOUTH EVERY DAY AS NEEDED FOR PAIN   pantoprazole  (PROTONIX ) 40 mg, Oral, Daily   simvastatin  (ZOCOR ) 40 mg, Oral, Daily at bedtime   zolpidem  (AMBIEN ) 10 MG tablet TAKE 1 TABLET BY MOUTH EVERY DAY AT BEDTIME AS NEEDED FOR SLEEP       Objective:   Physical Exam BP 122/76   Pulse 72   Temp 98.2 F (36.8 C) (Oral)   Resp 18   Ht 5' 3 (1.6 m)   Wt 260 lb 2 oz (118 kg)   SpO2 94%   BMI 46.08 kg/m  General: Well developed, NAD, BMI noted Neck: No  thyromegaly  HEENT:  Normocephalic . Face symmetric, atraumatic Lungs:  Clear bilaterally, few rhonchi and end expiratory wheezes with forced cough. Normal respiratory effort, no intercostal retractions, no accessory muscle use. Heart: RRR,  no murmur.  Abdomen:  Not distended, soft, non-tender. No rebound or  rigidity.   Lower extremities: no pretibial edema bilaterally MSK: Shoulders symmetric, range of motion normal with some pain at the right shoulder Skin: Exposed areas without rash. Not pale. Not jaundice Neurologic:  alert & oriented X3.  Speech normal, gait appropriate for age and unassisted Strength symmetric and appropriate for age.  Psych: Cognition and judgment appear intact.  Cooperative with normal attention span and concentration.  Behavior appropriate. No anxious or depressed appearing.     Assessment     ASSESSMENT HTN Hyperlipidemia Insomnia-- ambien  Hypothyroidism, s/p thyroidectomy Morbid obesity GERD  Mild anemia, normal iron 12-2013, cscope 08-2015 DJD  h/o kidney stones  CP-DOE: Normal stress test 08/2010, Normal stress test 06/07/2022   PLAN: Here for CPX -Td 06-2017 - PNM 23: 2015 and 2020 - PNM 13 06-22-15 -s/p Zostavax , shingrix  and RSV - Flu shot today - Recommend a COVID booster if not done recently   --CCS: cscope ~2011, poor prep. Colonoscopy again 08/2015, no biopsies, Cscope 09-2020, 5 years per GI letter --Per guidelines  no further Pap smears --MMG 02/2022 [facility]  , had one last week per pt  --DEXA  2017, DEXA 03-2022: Normal, rec Vit D - Labs: BMP FLP CBC   TSH -Healthcare POA: See AVS -Diet and exercise discuss HTN: On Zestoretic , BP is very good, recommend to check ambulatory BPs at home, checking a BMP and CBC Hypothyroidism: On Synthroid , check TSH. High cholesterol: On simvastatin , checking FLP, last LFTs normal. Right shoulder pain: Mostly nocturnal, refer to sports medicine, recommend to take only 1 meloxicam  a day and use Tylenol  as needed. Morbid obesity: Was unable to afford Contrave , information about the wellness center provided. Bronchospasm: No history of asthma but in the past she was prescribed inhaler, she is wheezing for the last few weeks, recommend to use Symbicort  for 2 months, call if not better. Addendum: Symbicort  was  expensive, switch to albuterol  RTC 3 months.

## 2023-03-23 ENCOUNTER — Other Ambulatory Visit: Payer: Self-pay | Admitting: Internal Medicine

## 2023-03-23 DIAGNOSIS — K219 Gastro-esophageal reflux disease without esophagitis: Secondary | ICD-10-CM

## 2023-03-27 NOTE — Telephone Encounter (Signed)
 Copied from CRM [id number]. Topic: Clinical - Prescription Issue >> Mar 27, 2023 12:44 PM Melissa C wrote: Reason for RMF:ejupzwu'd pharmacy called- they are switching manufacturers for her levothyroxine  (SYNTHROID ) 137 MCG tablet and she may need to do lab work after she finishes this supply of medication. Any questions and you can advise with pharmacy please. Thank you

## 2023-05-03 ENCOUNTER — Other Ambulatory Visit: Payer: Self-pay | Admitting: Internal Medicine

## 2023-05-04 ENCOUNTER — Other Ambulatory Visit: Payer: Self-pay | Admitting: Internal Medicine

## 2023-05-25 ENCOUNTER — Other Ambulatory Visit: Payer: Self-pay

## 2023-05-25 ENCOUNTER — Ambulatory Visit: Admitting: Family Medicine

## 2023-05-25 ENCOUNTER — Ambulatory Visit (INDEPENDENT_AMBULATORY_CARE_PROVIDER_SITE_OTHER)

## 2023-05-25 VITALS — BP 124/82 | HR 87 | Ht 63.0 in | Wt 265.0 lb

## 2023-05-25 DIAGNOSIS — M25511 Pain in right shoulder: Secondary | ICD-10-CM | POA: Diagnosis not present

## 2023-05-25 DIAGNOSIS — M5011 Cervical disc disorder with radiculopathy,  high cervical region: Secondary | ICD-10-CM | POA: Diagnosis not present

## 2023-05-25 DIAGNOSIS — M19011 Primary osteoarthritis, right shoulder: Secondary | ICD-10-CM | POA: Diagnosis not present

## 2023-05-25 DIAGNOSIS — G8929 Other chronic pain: Secondary | ICD-10-CM | POA: Diagnosis not present

## 2023-05-25 DIAGNOSIS — M5412 Radiculopathy, cervical region: Secondary | ICD-10-CM | POA: Diagnosis not present

## 2023-05-25 DIAGNOSIS — M4802 Spinal stenosis, cervical region: Secondary | ICD-10-CM | POA: Diagnosis not present

## 2023-05-25 NOTE — Patient Instructions (Addendum)
 Thank you for coming in today.   Please get an Xray today before you leave   I've referred you to Physical Therapy.  Let us know if you don't hear from them in one week.   Check back in 8 weeks

## 2023-05-25 NOTE — Progress Notes (Signed)
 Rubin Payor, PhD, LAT, ATC acting as a scribe for Kelly Graham, MD.  Kelly Perkins is a 73 y.o. female who presents to [facility] at [facility] today for R shoulder pain ongoing since Nov. No injury of fall. Pain is disturbing her sleep at night. Pt locates pain to all over the R shoulder w/ some pain along the R-side of her neck. She works as a Surveyor, mining for [employer].   Radiates: yes- into upper arm, w/ tingling Aggravates: nothing  Treatments tried: meloxicam, Tylenol  Pertinent review of systems: no fever or chills  Relevant historical information: Hypertension.  Hypothyroidism.  Right knee replacement (Dr Roda Shutters)    Exam:  BP 124/82   Pulse 87   Ht 5\' 3"  (1.6 m)   Wt 265 lb (120.2 kg)   SpO2 95%   BMI 46.94 kg/m  General: Well Developed, well nourished, and in no acute distress.   MSK: C-spine: Normal appearing Decreased cervical motion. Tender palpation right cervical paraspinal musculature and trapezius. Upper extremity strength is intact. Reflexes are intact Mildly positive right-sided Spurling's test.  Right shoulder normal-appearing normal motion some pain with functional internal rotation no pain with abduction. Intact strength. Mildly positive Hawkins and Neer's test.  Negative Yergason's and speeds test.    Lab and Radiology Results  X-ray images C-spine and right shoulder obtained today personally and independently interpreted.  C-spine: Mild DDD.  No acute fractures are visible.  Right shoulder: AC DJD.  No severe glenohumeral DJD.  No acute fractures are present.  Await formal radiology review     Assessment and Plan: 73 y.o. female with chronic multifactorial right shoulder and neck pain.  Patient does have evidence of rotator cuff tendinopathy and periscapular dysfunction.  She may have some cervical radiculopathy but this is less dominant.  This has been a chronic ongoing issue for over 2 months now  which has not resolved with relative rest a bit of home exercise program and meloxicam.  Plan for trial of physical therapy.  Will select the [facility] care location as she has seen Dr. Roda Shutters there before.  Recheck in about 2 months.  Return sooner if needed.   PDMP not reviewed this encounter. Orders Placed This Encounter  Procedures   DG Shoulder Right    Standing Status:   Future    Number of Occurrences:   1    Expiration Date:   05/24/2024    Reason for Exam (SYMPTOM  OR DIAGNOSIS REQUIRED):   right shoulder pain    Preferred imaging location?:   [facility] [place]   DG Cervical Spine 2 or 3 views    Standing Status:   Future    Number of Occurrences:   1    Expiration Date:   05/24/2024    Reason for Exam (SYMPTOM  OR DIAGNOSIS REQUIRED):   cervical radiculopathy    Preferred imaging location?:   [facility]   Ambulatory referral to Physical Therapy    Referral Priority:   Routine    Referral Type:   Physical Medicine    Referral Reason:   Specialty Services Required    Requested Specialty:   Physical Therapy    Number of Visits Requested:   1   No orders of the defined types were placed in this encounter.    Discussed warning signs or symptoms. Please see discharge instructions. Patient expresses understanding.   The above documentation has been reviewed and  is accurate and complete Kelly Perkins, M.D.

## 2023-06-01 ENCOUNTER — Other Ambulatory Visit: Payer: Self-pay | Admitting: Internal Medicine

## 2023-06-04 ENCOUNTER — Other Ambulatory Visit: Payer: Self-pay | Admitting: Internal Medicine

## 2023-06-05 ENCOUNTER — Telehealth: Payer: Self-pay | Admitting: Internal Medicine

## 2023-06-06 NOTE — Telephone Encounter (Signed)
 Requesting: Ambien 10mg   Contract: 08/05/22 UDS: Ambien only Last Visit: 03/17/23 Next Visit: 06/16/23 Last Refill: 02/07/23 #30 and 3RF   Please Advise

## 2023-06-06 NOTE — Telephone Encounter (Signed)
 PDMP okay, Rx sent

## 2023-06-10 ENCOUNTER — Other Ambulatory Visit: Payer: Self-pay | Admitting: Internal Medicine

## 2023-06-12 ENCOUNTER — Encounter: Payer: Self-pay | Admitting: Family Medicine

## 2023-06-12 NOTE — Progress Notes (Signed)
Right shoulder shows mild arthritis

## 2023-06-12 NOTE — Progress Notes (Signed)
 X-ray of your cervical spine shows mild to medium arthritis changes multiple locations in the spine.

## 2023-06-16 ENCOUNTER — Ambulatory Visit: Payer: Medicare HMO | Admitting: Internal Medicine

## 2023-06-20 ENCOUNTER — Ambulatory Visit: Admitting: Physical Therapy

## 2023-06-20 ENCOUNTER — Encounter: Payer: Self-pay | Admitting: Physical Therapy

## 2023-06-20 ENCOUNTER — Telehealth: Payer: Self-pay | Admitting: Family Medicine

## 2023-06-20 DIAGNOSIS — G8929 Other chronic pain: Secondary | ICD-10-CM

## 2023-06-20 DIAGNOSIS — M6281 Muscle weakness (generalized): Secondary | ICD-10-CM | POA: Diagnosis not present

## 2023-06-20 DIAGNOSIS — M25511 Pain in right shoulder: Secondary | ICD-10-CM

## 2023-06-20 DIAGNOSIS — R293 Abnormal posture: Secondary | ICD-10-CM

## 2023-06-20 NOTE — Therapy (Signed)
 OUTPATIENT PHYSICAL THERAPY EVALUATION   Patient Name: Kelly Perkins MRN: [id number] DOB:1950-04-08, 73 y.o., female Today's Date: 06/20/2023  END OF SESSION:  PT End of Session - 06/20/23 1213     Visit Number 1    Number of Visits 6    Date for PT Re-Evaluation 08/01/23    Authorization Type [employer] $25 copay    Progress Note Due on Visit 10    PT Start Time 1115    PT Stop Time 1200    PT Time Calculation (min) 45 min    Activity Tolerance Patient tolerated treatment well    Behavior During Therapy [facility] for tasks assessed/performed             Past Medical History:  Diagnosis Date   GERD (gastroesophageal reflux disease)    on meds   History of kidney stones    Hyperlipidemia 09/10/2010   on meds   Hypertension    on meds   Hypothyroidism    on meds   Normal cardiac stress test 08/2010   had CP-DOE   Osteoarthritis    generalizd   Wears dentures    top   Wears glasses    Past Surgical History:  Procedure Laterality Date   ABDOMINAL HYSTERECTOMY  2000   B oophoreectomy, d/t DUB   COLONOSCOPY  2017   JP-MAC-5 yr recall-poor prep-normal   CYSTOSCOPY/URETEROSCOPY/HOLMIUM LASER/STENT PLACEMENT Right 11/17/2017   Procedure: CYSTOSCOPY/RIGHT URETEROSCOPY/RIGHT HOLMIUM LASER/STENT PLACEMENT;  Surgeon: Rene Paci, MD;  Location: [facility];  Service: Urology;  Laterality: Right;   JOINT REPLACEMENT     LITHOTRIPSY  2004   urolthiasis    MASS EXCISION  09/12/2011   Procedure: EXCISION MASS;  Surgeon: Louisa Second, MD;  Location: [facility];  Service: Plastics;  Laterality: Left;  left ring finger   POLYPECTOMY     TA 2004   THYROIDECTOMY Left 2006   TOTAL KNEE ARTHROPLASTY Left 11/17/2016   Procedure: LEFT TOTAL KNEE ARTHROPLASTY;  Surgeon: Tarry Kos, MD;  Location: [facility];  Service: Orthopedics;  Laterality: Left;   TOTAL KNEE ARTHROPLASTY Right 12/04/2017   Procedure: RIGHT TOTAL KNEE ARTHROPLASTY;  Surgeon: Tarry Kos, MD;   Location: [facility];  Service: Orthopedics;  Laterality: Right;   Patient Active Problem List   Diagnosis Date Noted   Body mass index 40.0-44.9, adult (HCC) 12/15/2017   Morbid obesity (HCC) 07/03/2017   S/P TKR (total knee replacement), right 11/17/2016   PCP NOTES >>>>>>>>>>> 06/22/2015   Annual physical exam 06/09/2014   GERD (gastroesophageal reflux disease) 06/09/2014   Hyperlipidemia 09/10/2010   Anemia 08/11/2010   HTN (hypertension) 02/27/2008   ECHOCARDIOGRAM, ABNORMAL 02/27/2008   Hypothyroidism 04/13/2007   INSOMNIA, CHRONIC 04/13/2007   Osteoarthritis 04/13/2007    PCP: Wanda Plump, MD  REFERRING PROVIDER: Rodolph Bong, MD  REFERRING DIAG: [phone] (ICD-10-CM) - Chronic right shoulder pain M54.12 (ICD-10-CM) - Cervical radiculopathy  Rationale for Evaluation and Treatment: Rehabilitation  THERAPY DIAG:  Chronic right shoulder pain - Plan: PT plan of care cert/re-cert  Muscle weakness (generalized) - Plan: PT plan of care cert/re-cert  Abnormal posture - Plan: PT plan of care cert/re-cert  ONSET DATE: Nov 2024   SUBJECTIVE:  SUBJECTIVE STATEMENT: Pt c/o R shoulder pain ongoing since Nov. No injury of fall. Pain is disturbing her sleep at night. Pt locates pain to all over the R shoulder w/ some pain along the R-side of her neck and down the arm and occasionally into the fingers.  PERTINENT HISTORY:  GERD, HLD, HTN, OA, bil TKA  PAIN:  Are you having pain? Yes: NPRS scale: 0-10/10 Pain location: Rt shoulder and neck pain Pain description: sharp, stabbing Aggravating factors: unsure, worse at night, lying on Rt shoulder Relieving factors: medication (meloxicam, tylenol)  PRECAUTIONS:  None  RED FLAGS: None   WEIGHT BEARING RESTRICTIONS:  No  FALLS:  Has patient  fallen in last 6 months? No  LIVING ENVIRONMENT: Lives with: lives alone Lives in: House/apartment  OCCUPATION:  School bus driver for [employer]  PLOF:  Independent and Leisure: walking  PATIENT GOALS:  Improve pain   OBJECTIVE:  Note: Objective measures were completed at Evaluation unless otherwise noted.  DIAGNOSTIC FINDINGS:  X-rays: cervical spine shows mild to medium arthritis changes multiple locations in the spine.  Right shoulder shows mild arthritis      PATIENT SURVEYS:  Patient-Specific Activity Scoring Scheme  "0" represents "unable to perform." "10" represents "able to perform at prior level. 0 1 2 3 4 5 6 7 8 9  10 (Date and Score)   Activity Eval     1. Sleeping on Rt side 5    2. Driving the school bus 5    3. Vacuuming 5   Score 5    Total score = sum of the activity scores/number of activities Minimum detectable change (90%CI) for average score = 2 points Minimum detectable change (90%CI) for single activity score = 3 points    COGNITIVE STATUS: Within functional limits for tasks assessed   SENSATION: WFL  POSTURE:  rounded shoulders, forward head, and increased thoracic kyphosis  HAND DOMINANCE:  Right  GAIT: 06/20/23 Comments: independent   PALPATION: 06/20/23 tender to palpation to Rt proximal biceps tendon; lateral shoulder, infraspinatus, upper trap   CERVICAL ROM:   ROM A/PROM (deg) eval  Flexion 48  Extension 46 (pain)  Right lateral flexion 40  Left lateral flexion 29 (pain on Lt)  Right rotation 42  Left rotation 51   (Blank rows = not tested)   UPPER EXTREMITY ROM:  ROM Right eval Left eval  Shoulder flexion 161 (supine) 169 (sitting with pain)   Shoulder extension    Shoulder abduction 70 (supine) 112 (sitting)   Shoulder adduction    Shoulder extension    Shoulder internal rotation FIR: T11/12 with pain FIR: T11/12  Shoulder external rotation 29 (supine)   Elbow flexion    Elbow extension    Wrist  flexion    Wrist extension    Wrist ulnar deviation    Wrist radial deviation    Wrist pronation    Wrist supination     (Blank rows = not tested)   UPPER EXTREMITY MMT:  MMT Right eval Left eval  Shoulder flexion 3/5 with pain   Shoulder extension    Shoulder abduction 3/5 with pain   Shoulder adduction    Shoulder extension    Shoulder internal rotation 4/5 with pain   Shoulder external rotation 3/5 with pain   Middle trapezius    Lower trapezius    Elbow flexion    Elbow extension    Wrist flexion    Wrist extension    Wrist ulnar deviation  Wrist radial deviation    Wrist pronation    Wrist supination    Grip strength     (Blank rows = not tested)    SPECIAL TESTS:  06/20/23 Upper Extremity Impingement tests: Hawkins/Kennedy impingement test: positive  and Biceps assessment: Speed's test: positive    TREATMENT:                                                                                                                              DATE:  06/20/23 TherEx See HEP - demonstrated with trial reps performed PRN, min cues for comprehension    Self Care Educated on PT POC and clinical findings    PATIENT EDUCATION:  Education details: HEP Person educated: Patient Education method: Programmer, multimedia, Facilities manager, and Handouts Education comprehension: verbalized understanding, returned demonstration, and needs further education  HOME EXERCISE PROGRAM: Access Code: [email or web address] URL: [email or web address] Date: 06/20/2023 Prepared by: Moshe Cipro  Exercises - Standing Backward Shoulder Rolls  - 2 x daily - 7 x weekly - 1 sets - 10 reps - Standing Scapular Retraction  - 2 x daily - 7 x weekly - 1 sets - 10 reps - 5 sec hold - Shoulder External Rotation and Scapular Retraction  - 1 x daily - 7 x weekly - 1 sets - 10 reps - 1-2 sec hold - Seated Scalenes Stretch  - 1 x daily - 7 x weekly - 3 sets - 10 reps   ASSESSMENT:  CLINICAL  IMPRESSION: Patient is a 73 y.o. female who was seen today for physical therapy evaluation and treatment for Rt shoulder and neck pain. She demonstrates mild ROM limitations, decreased strength, postural abnormalities and continued pain affecting functional mobility.  She will benefit from PT to address deficits listed.     OBJECTIVE IMPAIRMENTS: decreased ROM, decreased strength, increased fascial restrictions, increased muscle spasms, impaired UE functional use, postural dysfunction, and pain.   ACTIVITY LIMITATIONS: carrying, lifting, sleeping, bed mobility, bathing, dressing, reach over head, and hygiene/grooming  PARTICIPATION LIMITATIONS: meal prep, cleaning, laundry, driving, shopping, community activity, and occupation  PERSONAL FACTORS: 3+ comorbidities: GERD, HLD, HTN, OA, bil TKA  are also affecting patient's functional outcome.   REHAB POTENTIAL: Good  CLINICAL DECISION MAKING: Evolving/moderate complexity  EVALUATION COMPLEXITY: Moderate   GOALS: Goals reviewed with patient? Yes  SHORT TERM GOALS: Target date: 07/11/2023  Independent with initial HEP Goal status: INITIAL   LONG TERM GOALS: Target date: 08/01/2023  Independent with final HEP Goal status: INITIAL  2.  PSFS score improved by 2 points Goal status: INITIAL  3.  Rt shoulder ROM improved to WNL without increase in pain for improved function Goal status: INIITAL  4.  Report pain < 4/10 with driving and reaching activities for improved function Goal status: INITIAL  5.  Rt shoulder strength improved to 4/5 for improved ADLs and work responsibilities Goal status: INITIAL    PLAN:  PT FREQUENCY: 1x/week  PT DURATION: 6 weeks  PLANNED INTERVENTIONS: 97164- PT Re-evaluation, 97110-Therapeutic exercises, 97530- Therapeutic activity, [id number]- Neuromuscular re-education, 97535- Self Care, 16109- Manual therapy, [phone]- Aquatic Therapy, [phone]- Electrical stimulation (unattended), 97035- Ultrasound, 91478-  Ionotophoresis 4mg /ml Dexamethasone, Patient/Family education, Taping, Dry Needling, Joint mobilization, Cryotherapy, and Moist heat.  PLAN FOR NEXT SESSION: Review HEP, manual/modalities PRN for pain, shoulder strengthening   NEXT MD VISIT: 07/20/23   Clarita Crane, PT, DPT 06/20/23 12:16 PM    Referring diagnosis? M25.511,G89.29, M54.12 Treatment diagnosis? (if different than referring diagnosis) M25.511, G89.29, M62.81, R29.3 What was this (referring dx) caused by? []  Surgery []  Fall [x]  Ongoing issue []  Arthritis []  Other: ____________  Laterality: [x]  Rt []  Lt []  Both  Check all possible CPT codes:  *CHOOSE 10 OR LESS*    See Planned Interventions listed in the Plan section of the Evaluation.

## 2023-06-20 NOTE — Telephone Encounter (Signed)
 Patient called back. Can we reach out again to discuss?

## 2023-06-20 NOTE — Telephone Encounter (Signed)
 Called pt and reviewed XR results. Pt will continue with PT for an additional 4 visits and then call us with an update on sx management.

## 2023-06-20 NOTE — Telephone Encounter (Signed)
 Patient called to return a call from [facility].

## 2023-06-20 NOTE — Telephone Encounter (Signed)
 Spoke with patient to review results earlier today.

## 2023-06-27 ENCOUNTER — Ambulatory Visit (INDEPENDENT_AMBULATORY_CARE_PROVIDER_SITE_OTHER): Admitting: Internal Medicine

## 2023-06-27 ENCOUNTER — Encounter: Payer: Self-pay | Admitting: Internal Medicine

## 2023-06-27 VITALS — BP 126/82 | HR 83 | Temp 98.5°F | Resp 18 | Ht 63.0 in | Wt 267.0 lb

## 2023-06-27 DIAGNOSIS — I10 Essential (primary) hypertension: Secondary | ICD-10-CM | POA: Diagnosis not present

## 2023-06-27 DIAGNOSIS — E039 Hypothyroidism, unspecified: Secondary | ICD-10-CM

## 2023-06-27 DIAGNOSIS — R739 Hyperglycemia, unspecified: Secondary | ICD-10-CM | POA: Diagnosis not present

## 2023-06-27 NOTE — Patient Instructions (Signed)
 INSTRUCTIONS  FOR TODAY   Check the  blood pressure regularly Blood pressure goal:  between 110/65 and  135/85. If it is consistently higher or lower, let me know     GO TO THE LAB : Get the blood work     Next office visit for a checkup in 6 months Please make an appointment before you leave today Depending on your blood or XRs results it might be necessary to come back sooner

## 2023-06-27 NOTE — Progress Notes (Unsigned)
 Subjective:    Patient ID: Kelly Perkins, female    DOB: 1951/02/05, 73 y.o.   MRN: [id number]  DOS:  06/27/2023 Type of visit - description: 66-month follow-up  Chronic medical problems reviewed. In general feeling well. Still has shoulder pain, doing physical therapy. No recent bronchospasm. No recent ambulatory BPs  Review of Systems See above   Past Medical History:  Diagnosis Date   GERD (gastroesophageal reflux disease)    on meds   History of kidney stones    Hyperlipidemia 09/10/2010   on meds   Hypertension    on meds   Hypothyroidism    on meds   Normal cardiac stress test 08/2010   had CP-DOE   Osteoarthritis    generalizd   Wears dentures    top   Wears glasses     Past Surgical History:  Procedure Laterality Date   ABDOMINAL HYSTERECTOMY  2000   B oophoreectomy, d/t DUB   COLONOSCOPY  2017   JP-MAC-5 yr recall-poor prep-normal   CYSTOSCOPY/URETEROSCOPY/HOLMIUM LASER/STENT PLACEMENT Right 11/17/2017   Procedure: CYSTOSCOPY/RIGHT URETEROSCOPY/RIGHT HOLMIUM LASER/STENT PLACEMENT;  Surgeon: Rene Paci, MD;  Location: [facility];  Service: Urology;  Laterality: Right;   JOINT REPLACEMENT     LITHOTRIPSY  2004   urolthiasis    MASS EXCISION  09/12/2011   Procedure: EXCISION MASS;  Surgeon: Louisa Second, MD;  Location: [facility];  Service: Plastics;  Laterality: Left;  left ring finger   POLYPECTOMY     TA 2004   THYROIDECTOMY Left 2006   TOTAL KNEE ARTHROPLASTY Left 11/17/2016   Procedure: LEFT TOTAL KNEE ARTHROPLASTY;  Surgeon: Tarry Kos, MD;  Location: [facility];  Service: Orthopedics;  Laterality: Left;   TOTAL KNEE ARTHROPLASTY Right 12/04/2017   Procedure: RIGHT TOTAL KNEE ARTHROPLASTY;  Surgeon: Tarry Kos, MD;  Location: [facility];  Service: Orthopedics;  Laterality: Right;    Current Outpatient Medications  Medication Instructions   albuterol (VENTOLIN HFA) 108 (90 Base) MCG/ACT inhaler 2 puffs, Inhalation, Every  6 hours PRN   cyclobenzaprine (FLEXERIL) 10 mg, Oral, At bedtime PRN   dicyclomine (BENTYL) 10 mg, Oral, 3 times daily before meals & bedtime   levothyroxine (SYNTHROID) 137 mcg, Oral, Daily before breakfast   lisinopril-hydrochlorothiazide (ZESTORETIC) 10-12.5 MG tablet 1 tablet, Oral, Daily   meloxicam (MOBIC) 15 mg, Oral, Daily PRN   pantoprazole (PROTONIX) 40 mg, Oral, Daily   simvastatin (ZOCOR) 40 mg, Oral, Daily at bedtime   zolpidem (AMBIEN) 10 mg, Oral, At bedtime PRN, for sleep       Objective:   Physical Exam BP 126/82   Pulse 83   Temp 98.5 F (36.9 C) (Oral)   Resp 18   Ht 5\' 3"  (1.6 m)   Wt 267 lb (121.1 kg)   SpO2 97%   BMI 47.30 kg/m  General:   Well developed, NAD, BMI noted. HEENT:  Normocephalic . Face symmetric, atraumatic Lungs:  CTA B Normal respiratory effort, no intercostal retractions, no accessory muscle use. Heart: RRR,  no murmur.  Lower extremities: no pretibial edema bilaterally  Skin: Not pale. Not jaundice Neurologic:  alert & oriented X3.  Speech normal, gait appropriate for age and unassisted Psych--  Cognition and judgment appear intact.  Cooperative with normal attention span and concentration.  Behavior appropriate. No anxious or depressed appearing.      Assessment   ASSESSMENT HTN Hyperlipidemia Insomnia-- ambien Hypothyroidism, s/p thyroidectomy Morbid obesity GERD Mild anemia, normal iron  12-2013, cscope 08-2015 DJD  h/o kidney stones  CP-DOE: Normal stress test 08/2010, Normal stress test 06/07/2022   PLAN: HTN: Good compliance with Zestoretic, BP today satisfactory, recommend to start checking BPs, check a BMP. Hypothyroidism: On Synthroid, check TSH Shoulder pain: Saw sports medicine, doing physical therapy, on meloxicam 1 a day. Bronchospasm, not an issue at this point, reports she has albuterol just in case. RTC 6 months routine checkup

## 2023-06-28 LAB — BASIC METABOLIC PANEL WITH GFR
BUN: 16 mg/dL (ref 6–23)
CO2: 26 meq/L (ref 19–32)
Calcium: 9.5 mg/dL (ref 8.4–10.5)
Chloride: 104 meq/L (ref 96–112)
Creatinine, Ser: 0.94 mg/dL (ref 0.40–1.20)
GFR: 59.74 mL/min — ABNORMAL LOW (ref >60.00)
Glucose, Bld: 123 mg/dL — ABNORMAL HIGH (ref 70–99)
Potassium: 4 meq/L (ref 3.5–5.1)
Sodium: 141 meq/L (ref 135–145)

## 2023-06-28 LAB — TSH: TSH: 1.58 u[IU]/mL (ref 0.35–5.50)

## 2023-06-28 NOTE — Assessment & Plan Note (Signed)
 HTN: Good compliance with Zestoretic, BP today satisfactory, recommend to start checking BPs, check a BMP. Hypothyroidism: On Synthroid, check TSH Shoulder pain: Saw sports medicine, doing physical therapy, on meloxicam 1 a day. Bronchospasm, not an issue at this point, reports she has albuterol just in case. RTC 6 months routine checkup

## 2023-06-29 ENCOUNTER — Encounter: Payer: Self-pay | Admitting: Internal Medicine

## 2023-06-29 NOTE — Addendum Note (Signed)
 Addended by: Lashea Goda D on: 06/29/2023 04:03 PM   Modules accepted: Orders

## 2023-07-07 ENCOUNTER — Ambulatory Visit: Admitting: Physical Therapy

## 2023-07-07 ENCOUNTER — Encounter: Payer: Self-pay | Admitting: Physical Therapy

## 2023-07-07 DIAGNOSIS — G8929 Other chronic pain: Secondary | ICD-10-CM | POA: Diagnosis not present

## 2023-07-07 DIAGNOSIS — M25511 Pain in right shoulder: Secondary | ICD-10-CM | POA: Diagnosis not present

## 2023-07-07 DIAGNOSIS — R293 Abnormal posture: Secondary | ICD-10-CM | POA: Diagnosis not present

## 2023-07-07 DIAGNOSIS — M6281 Muscle weakness (generalized): Secondary | ICD-10-CM | POA: Diagnosis not present

## 2023-07-07 NOTE — Therapy (Signed)
 OUTPATIENT PHYSICAL THERAPY TREATMENT   Patient Name: Kelly Perkins MRN: [id number] DOB:October 25, 1950, 73 y.o., female Today's Date: 07/07/2023  END OF SESSION:  PT End of Session - 07/07/23 1149     Visit Number 2    Number of Visits 6    Date for PT Re-Evaluation 08/01/23    Authorization Type [employer] $25 copay    PT Start Time 1145    PT Stop Time 1225    PT Time Calculation (min) 40 min    Activity Tolerance Patient tolerated treatment well    Behavior During Therapy WFL for tasks assessed/performed              Past Medical History:  Diagnosis Date   GERD (gastroesophageal reflux disease)    on meds   History of kidney stones    Hyperlipidemia 09/10/2010   on meds   Hypertension    on meds   Hypothyroidism    on meds   Normal cardiac stress test 08/2010   had CP-DOE   Osteoarthritis    generalizd   Wears dentures    top   Wears glasses    Past Surgical History:  Procedure Laterality Date   ABDOMINAL HYSTERECTOMY  2000   B oophoreectomy, d/t DUB   COLONOSCOPY  2017   JP-MAC-5 yr recall-poor prep-normal   CYSTOSCOPY/URETEROSCOPY/HOLMIUM LASER/STENT PLACEMENT Right 11/17/2017   Procedure: CYSTOSCOPY/RIGHT URETEROSCOPY/RIGHT HOLMIUM LASER/STENT PLACEMENT;  Surgeon: Adelbert Homans, MD;  Location: [facility];  Service: Urology;  Laterality: Right;   JOINT REPLACEMENT     LITHOTRIPSY  2004   urolthiasis    MASS EXCISION  09/12/2011   Procedure: EXCISION MASS;  Surgeon: Phyllis Breeze, MD;  Location: [facility];  Service: Plastics;  Laterality: Left;  left ring finger   POLYPECTOMY     TA 2004   THYROIDECTOMY Left 2006   TOTAL KNEE ARTHROPLASTY Left 11/17/2016   Procedure: LEFT TOTAL KNEE ARTHROPLASTY;  Surgeon: Wes Hamman, MD;  Location: [facility];  Service: Orthopedics;  Laterality: Left;   TOTAL KNEE ARTHROPLASTY Right 12/04/2017   Procedure: RIGHT TOTAL KNEE ARTHROPLASTY;  Surgeon: Wes Hamman, MD;  Location: [facility];  Service:  Orthopedics;  Laterality: Right;   Patient Active Problem List   Diagnosis Date Noted   Body mass index 40.0-44.9, adult (HCC) 12/15/2017   Morbid obesity (HCC) 07/03/2017   S/P TKR (total knee replacement), right 11/17/2016   PCP NOTES >>>>>>>>>>> 06/22/2015   Annual physical exam 06/09/2014   GERD (gastroesophageal reflux disease) 06/09/2014   Hyperlipidemia 09/10/2010   Anemia 08/11/2010   HTN (hypertension) 02/27/2008   ECHOCARDIOGRAM, ABNORMAL 02/27/2008   Hypothyroidism 04/13/2007   INSOMNIA, CHRONIC 04/13/2007   Osteoarthritis 04/13/2007    PCP: Ezell Hollow, MD  REFERRING PROVIDER: Syliva Even, MD  REFERRING DIAG: [phone] (ICD-10-CM) - Chronic right shoulder pain M54.12 (ICD-10-CM) - Cervical radiculopathy  Rationale for Evaluation and Treatment: Rehabilitation  THERAPY DIAG:  Chronic right shoulder pain  Muscle weakness (generalized)  Abnormal posture  ONSET DATE: Nov 2024   SUBJECTIVE:  SUBJECTIVE STATEMENT:   Shoulder hasn't been bothering me this week, I'm surprised. Regular with HEP. At this point shoulder really only bothers me when I'm sleeping/laying on it at night, vacuuming and driving are much easier.     EVAL: Pt c/o R shoulder pain ongoing since Nov. No injury of fall. Pain is disturbing her sleep at night. Pt locates pain to all over the R shoulder w/ some pain along the R-side of her neck and down the arm and occasionally into the fingers.  PERTINENT HISTORY:  GERD, HLD, HTN, OA, bil TKA  PAIN:  Are you having pain? No 0/10  PRECAUTIONS:  None  RED FLAGS: None   WEIGHT BEARING RESTRICTIONS:  No  FALLS:  Has patient fallen in last 6 months? No  LIVING ENVIRONMENT: Lives with: lives alone Lives in: House/apartment  OCCUPATION:  School  bus driver for [employer]  PLOF:  Independent and Leisure: walking  PATIENT GOALS:  Improve pain   OBJECTIVE:  Note: Objective measures were completed at Evaluation unless otherwise noted.  DIAGNOSTIC FINDINGS:  X-rays: cervical spine shows mild to medium arthritis changes multiple locations in the spine.  Right shoulder shows mild arthritis      PATIENT SURVEYS:  Patient-Specific Activity Scoring Scheme  "0" represents "unable to perform." "10" represents "able to perform at prior level. 0 1 2 3 4 5 6 7 8 9  10 (Date and Score)   Activity Eval     1. Sleeping on Rt side 5    2. Driving the school bus 5    3. Vacuuming 5   Score 5    Total score = sum of the activity scores/number of activities Minimum detectable change (90%CI) for average score = 2 points Minimum detectable change (90%CI) for single activity score = 3 points    COGNITIVE STATUS: Within functional limits for tasks assessed   SENSATION: WFL  POSTURE:  rounded shoulders, forward head, and increased thoracic kyphosis  HAND DOMINANCE:  Right  GAIT: 06/20/23 Comments: independent   PALPATION: 06/20/23 tender to palpation to Rt proximal biceps tendon; lateral shoulder, infraspinatus, upper trap   CERVICAL ROM:   ROM A/PROM (deg) eval  Flexion 48  Extension 46 (pain)  Right lateral flexion 40  Left lateral flexion 29 (pain on Lt)  Right rotation 42  Left rotation 51   (Blank rows = not tested)   UPPER EXTREMITY ROM:  ROM Right eval Left eval  Shoulder flexion 161 (supine) 169 (sitting with pain)   Shoulder extension    Shoulder abduction 70 (supine) 112 (sitting)   Shoulder adduction    Shoulder extension    Shoulder internal rotation FIR: T11/12 with pain FIR: T11/12  Shoulder external rotation 29 (supine)   Elbow flexion    Elbow extension    Wrist flexion    Wrist extension    Wrist ulnar deviation    Wrist radial deviation    Wrist pronation    Wrist supination      (Blank rows = not tested)   UPPER EXTREMITY MMT:  MMT Right eval Left eval  Shoulder flexion 3/5 with pain   Shoulder extension    Shoulder abduction 3/5 with pain   Shoulder adduction    Shoulder extension    Shoulder internal rotation 4/5 with pain   Shoulder external rotation 3/5 with pain   Middle trapezius    Lower trapezius    Elbow flexion    Elbow extension    Wrist flexion  Wrist extension    Wrist ulnar deviation    Wrist radial deviation    Wrist pronation    Wrist supination    Grip strength     (Blank rows = not tested)    SPECIAL TESTS:  06/20/23 Upper Extremity Impingement tests: Hawkins/Kennedy impingement test: positive  and Biceps assessment: Speed's test: positive    TREATMENT:                                                                                                                              DATE:   07/07/23  UBE L2.5 x4 min forward/4 min backward for w/u and tissue perfusion 3 way reaches yellow TB x5 on the R, increased pain after 5 reps so stopped   Supine shoulder flexion 2# 2x10  Supine serratus punches 2# 2x10  Sidelying shoulder ABD 0# to 90* x10 Sidelying shoulder ER towel pinched to side 0# x10  Corner pec stretch 2x30 seconds  UT stretch 2x30 seconds B Levator stretch 2x30 seconds B  Thoracic flexion/extension for postural mobility x10       06/20/23 TherEx See HEP - demonstrated with trial reps performed PRN, min cues for comprehension    Self Care Educated on PT POC and clinical findings    PATIENT EDUCATION:  Education details: HEP Person educated: Patient Education method: Programmer, multimedia, Facilities manager, and Handouts Education comprehension: verbalized understanding, returned demonstration, and needs further education  HOME EXERCISE PROGRAM:  Access Code: [email or web address] URL: [email or web address] Date: 07/07/2023 Prepared by: Terrel Ferries  Exercises - Standing Backward Shoulder Rolls  - 2 x  daily - 7 x weekly - 1 sets - 10 reps - Standing Scapular Retraction  - 2 x daily - 7 x weekly - 1 sets - 10 reps - 5 sec hold - Shoulder External Rotation and Scapular Retraction  - 1 x daily - 7 x weekly - 1 sets - 10 reps - 1-2 sec hold - Seated Scalenes Stretch  - 1 x daily - 7 x weekly - 3 sets - 10 reps - Supine Shoulder Flexion with Free Weight  - 1 x daily - 4-5 x weekly - 2 sets - 10 reps - Single Arm Serratus Punches in Supine with Dumbbell  - 1 x daily - 4-5 x weekly - 2 sets - 10 reps   ASSESSMENT:  CLINICAL IMPRESSION:   Pt arrives today doing well, she has had no pain in her shoulder recently. We progressed all exercises and activities as tolerated today, did fairly well with first full tx session. Does seem to have a large pec spasm that may be aggravating her pain. Will progress challenges as appropriate.      EVAL: Patient is a 73 y.o. female who was seen today for physical therapy evaluation and treatment for Rt shoulder and neck pain. She demonstrates mild ROM limitations, decreased strength, postural abnormalities and continued pain affecting functional mobility.  She will benefit from PT to address deficits  listed.     OBJECTIVE IMPAIRMENTS: decreased ROM, decreased strength, increased fascial restrictions, increased muscle spasms, impaired UE functional use, postural dysfunction, and pain.   ACTIVITY LIMITATIONS: carrying, lifting, sleeping, bed mobility, bathing, dressing, reach over head, and hygiene/grooming  PARTICIPATION LIMITATIONS: meal prep, cleaning, laundry, driving, shopping, community activity, and occupation  PERSONAL FACTORS: 3+ comorbidities: GERD, HLD, HTN, OA, bil TKA  are also affecting patient's functional outcome.   REHAB POTENTIAL: Good  CLINICAL DECISION MAKING: Evolving/moderate complexity  EVALUATION COMPLEXITY: Moderate   GOALS: Goals reviewed with patient? Yes  SHORT TERM GOALS: Target date: 07/11/2023  Independent with initial  HEP Goal status: INITIAL   LONG TERM GOALS: Target date: 08/01/2023  Independent with final HEP Goal status: INITIAL  2.  PSFS score improved by 2 points Goal status: INITIAL  3.  Rt shoulder ROM improved to WNL without increase in pain for improved function Goal status: INIITAL  4.  Report pain < 4/10 with driving and reaching activities for improved function Goal status: INITIAL  5.  Rt shoulder strength improved to 4/5 for improved ADLs and work responsibilities Goal status: INITIAL    PLAN:  PT FREQUENCY: 1x/week  PT DURATION: 6 weeks  PLANNED INTERVENTIONS: 97164- PT Re-evaluation, 97110-Therapeutic exercises, 97530- Therapeutic activity, [id number]- Neuromuscular re-education, 97535- Self Care, 09811- Manual therapy, [id number]- Aquatic Therapy, B1478- Electrical stimulation (unattended), 97035- Ultrasound, 29562- Ionotophoresis 4mg /ml Dexamethasone , Patient/Family education, Taping, Dry Needling, Joint mobilization, Cryotherapy, and Moist heat.  PLAN FOR NEXT SESSION:  manual/modalities PRN for pain, shoulder strengthening, regular HEP updates    NEXT MD VISIT: 07/20/23  Terrel Ferries, PT, DPT 07/07/23 12:25 PM    Referring diagnosis? M25.511,G89.29, M54.12 Treatment diagnosis? (if different than referring diagnosis) M25.511, G89.29, M62.81, R29.3 What was this (referring dx) caused by? []  Surgery []  Fall [x]  Ongoing issue []  Arthritis []  Other: ____________  Laterality: [x]  Rt []  Lt []  Both  Check all possible CPT codes:  *CHOOSE 10 OR LESS*    See Planned Interventions listed in the Plan section of the Evaluation.

## 2023-07-12 ENCOUNTER — Telehealth: Payer: Self-pay | Admitting: Rehabilitative and Restorative Service Providers"

## 2023-07-12 ENCOUNTER — Encounter: Admitting: Rehabilitative and Restorative Service Providers"

## 2023-07-12 NOTE — Telephone Encounter (Signed)
 Front desk attempted to call patient after mychart indicated patient picked no for attending appointment (doesn't cancel appointment in epic system).  Unable to reach patient at that time.    Bonna Bustard, PT, DPT, OCS, ATC 07/12/23  12:02 PM

## 2023-07-19 ENCOUNTER — Ambulatory Visit: Admitting: Rehabilitative and Restorative Service Providers"

## 2023-07-19 ENCOUNTER — Encounter: Payer: Self-pay | Admitting: Rehabilitative and Restorative Service Providers"

## 2023-07-19 DIAGNOSIS — R293 Abnormal posture: Secondary | ICD-10-CM

## 2023-07-19 DIAGNOSIS — G8929 Other chronic pain: Secondary | ICD-10-CM | POA: Diagnosis not present

## 2023-07-19 DIAGNOSIS — M25511 Pain in right shoulder: Secondary | ICD-10-CM | POA: Diagnosis not present

## 2023-07-19 DIAGNOSIS — M6281 Muscle weakness (generalized): Secondary | ICD-10-CM

## 2023-07-19 NOTE — Therapy (Signed)
 OUTPATIENT PHYSICAL THERAPY TREATMENT   Patient Name: Kelly Perkins MRN: [id number] DOB:01/16/1951, 73 y.o., female Today's Date: 07/19/2023  END OF SESSION:  PT End of Session - 07/19/23 1149     Visit Number 3    Number of Visits 6    Date for PT Re-Evaluation 08/01/23    Authorization Type [employer] $25 copay    Authorization Time Period 06/20/2023 - 08/01/2023    PT Start Time 1146    PT Stop Time 1224    PT Time Calculation (min) 38 min    Activity Tolerance Patient tolerated treatment well    Behavior During Therapy [facility] for tasks assessed/performed               Past Medical History:  Diagnosis Date   GERD (gastroesophageal reflux disease)    on meds   History of kidney stones    Hyperlipidemia 09/10/2010   on meds   Hypertension    on meds   Hypothyroidism    on meds   Normal cardiac stress test 08/2010   had CP-DOE   Osteoarthritis    generalizd   Wears dentures    top   Wears glasses    Past Surgical History:  Procedure Laterality Date   ABDOMINAL HYSTERECTOMY  2000   B oophoreectomy, d/t DUB   COLONOSCOPY  2017   JP-MAC-5 yr recall-poor prep-normal   CYSTOSCOPY/URETEROSCOPY/HOLMIUM LASER/STENT PLACEMENT Right 11/17/2017   Procedure: CYSTOSCOPY/RIGHT URETEROSCOPY/RIGHT HOLMIUM LASER/STENT PLACEMENT;  Surgeon: Adelbert Homans, MD;  Location: [facility];  Service: Urology;  Laterality: Right;   JOINT REPLACEMENT     LITHOTRIPSY  2004   urolthiasis    MASS EXCISION  09/12/2011   Procedure: EXCISION MASS;  Surgeon: Phyllis Breeze, MD;  Location: [facility];  Service: Plastics;  Laterality: Left;  left ring finger   POLYPECTOMY     TA 2004   THYROIDECTOMY Left 2006   TOTAL KNEE ARTHROPLASTY Left 11/17/2016   Procedure: LEFT TOTAL KNEE ARTHROPLASTY;  Surgeon: Wes Hamman, MD;  Location: [facility];  Service: Orthopedics;  Laterality: Left;   TOTAL KNEE ARTHROPLASTY Right 12/04/2017   Procedure: RIGHT TOTAL KNEE ARTHROPLASTY;  Surgeon:  Wes Hamman, MD;  Location: [facility];  Service: Orthopedics;  Laterality: Right;   Patient Active Problem List   Diagnosis Date Noted   Body mass index 40.0-44.9, adult (HCC) 12/15/2017   Morbid obesity (HCC) 07/03/2017   S/P TKR (total knee replacement), right 11/17/2016   PCP NOTES >>>>>>>>>>> 06/22/2015   Annual physical exam 06/09/2014   GERD (gastroesophageal reflux disease) 06/09/2014   Hyperlipidemia 09/10/2010   Anemia 08/11/2010   HTN (hypertension) 02/27/2008   ECHOCARDIOGRAM, ABNORMAL 02/27/2008   Hypothyroidism 04/13/2007   INSOMNIA, CHRONIC 04/13/2007   Osteoarthritis 04/13/2007    PCP: Ezell Hollow, MD  REFERRING PROVIDER: Syliva Even, MD  REFERRING DIAG: [phone] (ICD-10-CM) - Chronic right shoulder pain M54.12 (ICD-10-CM) - Cervical radiculopathy  Rationale for Evaluation and Treatment: Rehabilitation  THERAPY DIAG:  Chronic right shoulder pain  Muscle weakness (generalized)  Abnormal posture  ONSET DATE: Nov 2024   SUBJECTIVE:  SUBJECTIVE STATEMENT:   Shoulder hasn't been bothering me this week, I'm surprised. Regular with HEP. At this point shoulder really only bothers me when I'm sleeping/laying on it at night, vacuuming and driving are much easier.     EVAL: Pt c/o R shoulder pain ongoing since Nov. No injury of fall. Pain is disturbing her sleep at night. Pt locates pain to all over the R shoulder w/ some pain along the R-side of her neck and down the arm and occasionally into the fingers.  PERTINENT HISTORY:  GERD, HLD, HTN, OA, bil TKA  PAIN:   NPRS scale: 10/10 Pain location: Rt neck pain Pain description: sore/throbbing Aggravating factors: last night started, lying Rt side Relieving factors: nothing specific   PRECAUTIONS:  None  RED  FLAGS: None   WEIGHT BEARING RESTRICTIONS:  No  FALLS:  Has patient fallen in last 6 months? No  LIVING ENVIRONMENT: Lives with: lives alone Lives in: House/apartment  OCCUPATION:  School bus driver for [employer]  PLOF:  Independent and Leisure: walking  PATIENT GOALS:  Improve pain   OBJECTIVE:  Note: Objective measures were completed at Evaluation unless otherwise noted.  DIAGNOSTIC FINDINGS:  X-rays: cervical spine shows mild to medium arthritis changes multiple locations in the spine.  Right shoulder shows mild arthritis      PATIENT SURVEYS:  Patient-Specific Activity Scoring Scheme  "0" represents "unable to perform." "10" represents "able to perform at prior level. 0 1 2 3 4 5 6 7 8 9  10 (Date and Score)   Activity Eval  06/20/2023  07/19/2023  1. Sleeping on Rt side 5 5   2. Driving the school bus 5  10  3. Vacuuming 5 6  Score 5 7 avg   Total score = sum of the activity scores/number of activities Minimum detectable change (90%CI) for average score = 2 points Minimum detectable change (90%CI) for single activity score = 3 points    COGNITIVE STATUS: 06/20/2023 Within functional limits for tasks assessed   SENSATION: 06/20/2023 [facility]  POSTURE:  06/20/2023 rounded shoulders, forward head, and increased thoracic kyphosis  HAND DOMINANCE:  06/20/2023 Right  GAIT: 06/20/23 Comments: independent   PALPATION: 07/19/2023: noted tenderness and tightness with trigger points in Rt upper trap and Rt infraspinatus c concordant symptoms.   06/20/23 tender to palpation to Rt proximal biceps tendon; lateral shoulder, infraspinatus, upper trap   CERVICAL ROM:   ROM A/PROM (deg) Eval 06/20/2023  Flexion 48  Extension 46 (pain)  Right lateral flexion 40  Left lateral flexion 29 (pain on Lt)  Right rotation 42  Left rotation 51   (Blank rows = not tested)   UPPER EXTREMITY ROM:  ROM Right Eval 06/20/2023 Left Eval 06/20/2023  Shoulder flexion 161  (supine) 169 (sitting with pain)   Shoulder extension    Shoulder abduction 70 (supine) 112 (sitting)   Shoulder adduction    Shoulder extension    Shoulder internal rotation FIR: T11/12 with pain FIR: T11/12  Shoulder external rotation 29 (supine)   Elbow flexion    Elbow extension    Wrist flexion    Wrist extension    Wrist ulnar deviation    Wrist radial deviation    Wrist pronation    Wrist supination     (Blank rows = not tested)   UPPER EXTREMITY MMT:  MMT Right Eval 06/20/2023 Left Eval 06/20/2023  Shoulder flexion 3/5 with pain   Shoulder extension    Shoulder abduction 3/5 with pain  Shoulder adduction    Shoulder extension    Shoulder internal rotation 4/5 with pain   Shoulder external rotation 3/5 with pain   Middle trapezius    Lower trapezius    Elbow flexion    Elbow extension    Wrist flexion    Wrist extension    Wrist ulnar deviation    Wrist radial deviation    Wrist pronation    Wrist supination    Grip strength     (Blank rows = not tested)    SPECIAL TESTS:  06/20/23 Upper Extremity Impingement tests: Hawkins/Kennedy impingement test: positive  and Biceps assessment: Speed's test: positive                   TREATMENT        DATE: 07/19/2023 Therex:  UBE fwd/back 4 mins each way lvl 2.5 for ROM/tissue perfusion.  1 min rest between directions. Upper trap stretch 15 sec x 3 Rt   Chest corner stretch 15 sec x 3   Neuro re-ed (Postural awareness, activation and mobility) Seated scapular retraction 2 sec hold x 10 Standing green band rows c scapular retraction 2 x 10 Standing bilateral gh ext green band 2 x 10   Additional time spent for verbal and visual cues for exercise.   Manual Percussive device to Rt upper trap, Rt infraspinatus.  Discussion while performing about possible purchase for home if desired.    TREATMENT        DATE: 07/07/23 UBE L2.5 x4 min forward/4 min backward for w/u and tissue perfusion 3 way reaches yellow TB  x5 on the R, increased pain after 5 reps so stopped   Supine shoulder flexion 2# 2x10  Supine serratus punches 2# 2x10  Sidelying shoulder ABD 0# to 90* x10 Sidelying shoulder ER towel pinched to side 0# x10  Corner pec stretch 2x30 seconds  UT stretch 2x30 seconds B Levator stretch 2x30 seconds B  Thoracic flexion/extension for postural mobility x10   TREATMENT        DATE: 06/20/23 TherEx See HEP - demonstrated with trial reps performed PRN, min cues for comprehension    Self Care Educated on PT POC and clinical findings    PATIENT EDUCATION:  Education details: HEP Person educated: Patient Education method: Programmer, multimedia, Facilities manager, and Handouts Education comprehension: verbalized understanding, returned demonstration, and needs further education  HOME EXERCISE PROGRAM:  Access Code: [email or web address] URL: [email or web address] Date: 07/07/2023 Prepared by: Terrel Ferries  Exercises - Standing Backward Shoulder Rolls  - 2 x daily - 7 x weekly - 1 sets - 10 reps - Standing Scapular Retraction  - 2 x daily - 7 x weekly - 1 sets - 10 reps - 5 sec hold - Shoulder External Rotation and Scapular Retraction  - 1 x daily - 7 x weekly - 1 sets - 10 reps - 1-2 sec hold - Seated Scalenes Stretch  - 1 x daily - 7 x weekly - 3 sets - 10 reps - Supine Shoulder Flexion with Free Weight  - 1 x daily - 4-5 x weekly - 2 sets - 10 reps - Single Arm Serratus Punches in Supine with Dumbbell  - 1 x daily - 4-5 x weekly - 2 sets - 10 reps   ASSESSMENT:  CLINICAL IMPRESSION: Arrival today with noted increase in symptoms, mainly related to Rt upper trap/ posterior shoulder tightness/myofascial trigger points.  Treated with good success in clinic with percussive device with good overall tolerance to use.  Continued focus in clinic on posterior chain activation and postural support activation to help reduce load on tight musculature.   Arrival today with more symptoms but generally  reported improvement compared to eval with patient specific functional scale assessment.   OBJECTIVE IMPAIRMENTS: decreased ROM, decreased strength, increased fascial restrictions, increased muscle spasms, impaired UE functional use, postural dysfunction, and pain.   ACTIVITY LIMITATIONS: carrying, lifting, sleeping, bed mobility, bathing, dressing, reach over head, and hygiene/grooming  PARTICIPATION LIMITATIONS: meal prep, cleaning, laundry, driving, shopping, community activity, and occupation  PERSONAL FACTORS: 3+ comorbidities: GERD, HLD, HTN, OA, bil TKA  are also affecting patient's functional outcome.   REHAB POTENTIAL: Good  CLINICAL DECISION MAKING: Evolving/moderate complexity  EVALUATION COMPLEXITY: Moderate   GOALS: Goals reviewed with patient? Yes  SHORT TERM GOALS: Target date: 07/11/2023  Independent with initial HEP Goal status: met   LONG TERM GOALS: Target date: 08/01/2023  Independent with final HEP Goal status: on going 07/19/2023  2.  PSFS score improved by 2 points Goal status: Met for average, not met for sleeping. 07/19/2023  3.  Rt shoulder ROM improved to WNL without increase in pain for improved function Goal status: on going 07/19/2023  4.  Report pain < 4/10 with driving and reaching activities for improved function Goal status: on going 07/19/2023  5.  Rt shoulder strength improved to 4/5 for improved ADLs and work responsibilities Goal status: on going 07/19/2023    PLAN:  PT FREQUENCY: 1x/week  PT DURATION: 6 weeks  PLANNED INTERVENTIONS: 97164- PT Re-evaluation, 97110-Therapeutic exercises, 97530- Therapeutic activity, 97112- Neuromuscular re-education, 97535- Self Care, 16109- Manual therapy, [id number]- Aquatic Therapy, G0283- Electrical stimulation (unattended), 97035- Ultrasound, 60454- Ionotophoresis 4mg /ml Dexamethasone , Patient/Family education, Taping, Dry Needling, Joint mobilization, Cryotherapy, and Moist heat.  PLAN FOR NEXT SESSION:   Percussive device use if indicated/desired   Bonna Bustard, PT, DPT, OCS, ATC 07/19/23  12:25 PM      Referring diagnosis? M25.511,G89.29, M54.12 Treatment diagnosis? (if different than referring diagnosis) M25.511, G89.29, M62.81, R29.3 What was this (referring dx) caused by? []  Surgery []  Fall [x]  Ongoing issue []  Arthritis []  Other: ____________  Laterality: [x]  Rt []  Lt []  Both  Check all possible CPT codes:  *CHOOSE 10 OR LESS*    See Planned Interventions listed in the Plan section of the Evaluation.

## 2023-07-20 ENCOUNTER — Other Ambulatory Visit: Payer: Self-pay

## 2023-07-20 ENCOUNTER — Ambulatory Visit: Admitting: Family Medicine

## 2023-07-20 ENCOUNTER — Ambulatory Visit (INDEPENDENT_AMBULATORY_CARE_PROVIDER_SITE_OTHER)

## 2023-07-20 VITALS — BP 124/70 | HR 88 | Ht 63.0 in | Wt 269.0 lb

## 2023-07-20 DIAGNOSIS — M25511 Pain in right shoulder: Secondary | ICD-10-CM

## 2023-07-20 DIAGNOSIS — M25552 Pain in left hip: Secondary | ICD-10-CM | POA: Diagnosis not present

## 2023-07-20 DIAGNOSIS — M160 Bilateral primary osteoarthritis of hip: Secondary | ICD-10-CM | POA: Diagnosis not present

## 2023-07-20 DIAGNOSIS — M7062 Trochanteric bursitis, left hip: Secondary | ICD-10-CM

## 2023-07-20 DIAGNOSIS — G8929 Other chronic pain: Secondary | ICD-10-CM

## 2023-07-20 NOTE — Progress Notes (Signed)
 I, Leone Ralphs am a scribe for Dr. Garlan Juniper, MD.  Kelly Perkins is a 73 y.o. female who presents to [facility] today for f/u R shoulder pain and cervical radiculopathy. Pt was last seen by Dr. Alease Hunter on 06/12/23 and was referred to PT, completing 3 visits.  Today, pt reports that she had therapy on it yesterday so it feels good today. Patient states that she has a hurt in her left hip. Been going on for about a week. Can hardly get out of bed in the morning time.   Dx imaging: 05/25/23 R shoulder & c-spine XR  Pertinent review of systems: No fevers or chills  Relevant historical information: Hypertension   Exam:  BP 124/70   Pulse 88   Ht 5\' 3"  (1.6 m)   Wt 269 lb (122 kg)   SpO2 96%   BMI 47.65 kg/m  General: Well Developed, well nourished, and in no acute distress.   MSK: Right shoulder normal-appearing improving range of motion lacks full abduction.  Left hip: Normal-appearing Normal motion. Tender palpation greater trochanter. Decreased strength abduction 4/5.  External rotation strength is intact.   Lab and Radiology Results  Procedure: Real-time Ultrasound Guided Injection of left lateral hip greater trochanter bursa Device: Philips Affiniti 50G/GE Logiq Images permanently stored and available for review in PACS Verbal informed consent obtained.  Discussed risks and benefits of procedure. Warned about infection, bleeding, hyperglycemia damage to structures among others. Patient expresses understanding and agreement Time-out conducted.   Noted no overlying erythema, induration, or other signs of local infection.   Skin prepped in a sterile fashion.   Local anesthesia: Topical Ethyl chloride.   With sterile technique and under real time ultrasound guidance: 40 mg of Kenalog  and 2 mL of Marcaine  injected into greater trochanter bursa. Fluid seen entering the bursa.   Completed without difficulty   Pain immediately resolved  suggesting accurate placement of the medication.   Advised to call if fevers/chills, erythema, induration, drainage, or persistent bleeding.   Images permanently stored and available for review in the ultrasound unit.  Impression: Technically successful ultrasound guided injection.    X-ray images left hip obtained today personally and independently interpreted. Left hip osteoarthritis.  No acute fractures are visible. Await formal radiology review     Assessment and Plan: 73 y.o. female with left lateral hip pain due to trochanteric bursitis.  Plan for injection and physical therapy.  She already is engaged in physical therapy for her right shoulder and is improving.   Right shoulder pain improving with PT.  Plan to continue PT and home exercise program.  Recheck in 6 weeks.   PDMP not reviewed this encounter. Orders Placed This Encounter  Procedures   US  LIMITED JOINT SPACE STRUCTURES LOW LEFT(NO LINKED CHARGES)    Reason for Exam (SYMPTOM  OR DIAGNOSIS REQUIRED):   lt hip pain    Preferred imaging location?:   [facility]   DG HIP UNILAT WITH PELVIS 2-3 VIEWS LEFT    Standing Status:   Future    Number of Occurrences:   1    Expiration Date:   07/19/2024    Reason for Exam (SYMPTOM  OR DIAGNOSIS REQUIRED):   hip pain    Preferred imaging location?:   [facility]   Ambulatory referral to Physical Therapy    Referral Priority:   Routine    Referral Type:   Physical Medicine    Referral  Reason:   Specialty Services Required    Requested Specialty:   Physical Therapy    Number of Visits Requested:   1   No orders of the defined types were placed in this encounter.    Discussed warning signs or symptoms. Please see discharge instructions. Patient expresses understanding.   The above documentation has been reviewed and is accurate and complete Garlan Juniper, M.D.

## 2023-07-20 NOTE — Patient Instructions (Signed)
 Thank you for coming in today. Injected left hip today. Xray on the way out Referral to Physical Therapy for left hip. Follow up in 4 to 6 weeks.

## 2023-07-26 ENCOUNTER — Ambulatory Visit: Payer: Self-pay | Admitting: Family Medicine

## 2023-07-26 ENCOUNTER — Encounter: Payer: Self-pay | Admitting: Physical Therapy

## 2023-07-26 ENCOUNTER — Ambulatory Visit: Admitting: Physical Therapy

## 2023-07-26 DIAGNOSIS — R293 Abnormal posture: Secondary | ICD-10-CM

## 2023-07-26 DIAGNOSIS — G8929 Other chronic pain: Secondary | ICD-10-CM | POA: Diagnosis not present

## 2023-07-26 DIAGNOSIS — M6281 Muscle weakness (generalized): Secondary | ICD-10-CM

## 2023-07-26 DIAGNOSIS — M25511 Pain in right shoulder: Secondary | ICD-10-CM

## 2023-07-26 NOTE — Progress Notes (Signed)
 Left hip x-ray shows medium arthritis of both hip joints.

## 2023-07-26 NOTE — Therapy (Signed)
 OUTPATIENT PHYSICAL THERAPY TREATMENT DISCHARGE SUMMARY   Patient Name: Kelly Perkins MRN: [id number] DOB:07-01-1950, 73 y.o., female Today's Date: 07/26/2023  END OF SESSION:  PT End of Session - 07/26/23 1143     Visit Number 4    Number of Visits 6    Date for PT Re-Evaluation 08/01/23    Authorization Type [employer] $25 copay    Authorization Time Period 06/20/2023 - 08/01/2023    PT Start Time 1143    PT Stop Time 1209    PT Time Calculation (min) 26 min    Activity Tolerance Patient tolerated treatment well    Behavior During Therapy [facility] for tasks assessed/performed                Past Medical History:  Diagnosis Date   GERD (gastroesophageal reflux disease)    on meds   History of kidney stones    Hyperlipidemia 09/10/2010   on meds   Hypertension    on meds   Hypothyroidism    on meds   Normal cardiac stress test 08/2010   had CP-DOE   Osteoarthritis    generalizd   Wears dentures    top   Wears glasses    Past Surgical History:  Procedure Laterality Date   ABDOMINAL HYSTERECTOMY  2000   B oophoreectomy, d/t DUB   COLONOSCOPY  2017   JP-MAC-5 yr recall-poor prep-normal   CYSTOSCOPY/URETEROSCOPY/HOLMIUM LASER/STENT PLACEMENT Right 11/17/2017   Procedure: CYSTOSCOPY/RIGHT URETEROSCOPY/RIGHT HOLMIUM LASER/STENT PLACEMENT;  Surgeon: Adelbert Homans, MD;  Location: [facility];  Service: Urology;  Laterality: Right;   JOINT REPLACEMENT     LITHOTRIPSY  2004   urolthiasis    MASS EXCISION  09/12/2011   Procedure: EXCISION MASS;  Surgeon: Phyllis Breeze, MD;  Location: [facility];  Service: Plastics;  Laterality: Left;  left ring finger   POLYPECTOMY     TA 2004   THYROIDECTOMY Left 2006   TOTAL KNEE ARTHROPLASTY Left 11/17/2016   Procedure: LEFT TOTAL KNEE ARTHROPLASTY;  Surgeon: Wes Hamman, MD;  Location: [facility];  Service: Orthopedics;  Laterality: Left;   TOTAL KNEE ARTHROPLASTY Right 12/04/2017   Procedure: RIGHT TOTAL KNEE  ARTHROPLASTY;  Surgeon: Wes Hamman, MD;  Location: [facility];  Service: Orthopedics;  Laterality: Right;   Patient Active Problem List   Diagnosis Date Noted   Body mass index 40.0-44.9, adult (HCC) 12/15/2017   Morbid obesity (HCC) 07/03/2017   S/P TKR (total knee replacement), right 11/17/2016   PCP NOTES >>>>>>>>>>> 06/22/2015   Annual physical exam 06/09/2014   GERD (gastroesophageal reflux disease) 06/09/2014   Hyperlipidemia 09/10/2010   Anemia 08/11/2010   HTN (hypertension) 02/27/2008   ECHOCARDIOGRAM, ABNORMAL 02/27/2008   Hypothyroidism 04/13/2007   INSOMNIA, CHRONIC 04/13/2007   Osteoarthritis 04/13/2007    PCP: Ezell Hollow, MD  REFERRING PROVIDER: Syliva Even, MD  REFERRING DIAG: [id number] (ICD-10-CM) - Chronic right shoulder pain M54.12 (ICD-10-CM) - Cervical radiculopathy; M25.552 (ICD-10-CM) - Left hip pain   Rationale for Evaluation and Treatment: Rehabilitation  THERAPY DIAG:  Chronic right shoulder pain  Muscle weakness (generalized)  Abnormal posture  ONSET DATE: Nov 2024   SUBJECTIVE:  SUBJECTIVE STATEMENT: Has a new referral for her hip but would like to defer for now since the injection helped and she has no pain.  Shoulder is better today; but last week was more uncomfortable.  Thinks she's ready to wrap up with PT.   EVAL: Pt c/o R shoulder pain ongoing since Nov. No injury of fall. Pain is disturbing her sleep at night. Pt locates pain to all over the R shoulder w/ some pain along the R-side of her neck and down the arm and occasionally into the fingers.  PERTINENT HISTORY:  GERD, HLD, HTN, OA, bil TKA  PAIN:   NPRS scale: 10/10 Pain location: Rt neck pain Pain description: sore/throbbing Aggravating factors: last night started, lying Rt side Relieving  factors: nothing specific   PRECAUTIONS:  None  RED FLAGS: None   WEIGHT BEARING RESTRICTIONS:  No  FALLS:  Has patient fallen in last 6 months? No  LIVING ENVIRONMENT: Lives with: lives alone Lives in: House/apartment  OCCUPATION:  School bus driver for [employer]  PLOF:  Independent and Leisure: walking  PATIENT GOALS:  Improve pain   OBJECTIVE:  Note: Objective measures were completed at Evaluation unless otherwise noted.  DIAGNOSTIC FINDINGS:  X-rays: cervical spine shows mild to medium arthritis changes multiple locations in the spine.  Right shoulder shows mild arthritis      PATIENT SURVEYS:  Patient-Specific Activity Scoring Scheme  "0" represents "unable to perform." "10" represents "able to perform at prior level. 0 1 2 3 4 5 6 7 8 9  10 (Date and Score)   Activity Eval  06/20/2023  07/19/2023 07/26/23  1. Sleeping on Rt side 5 5  7   2. Driving the school bus 5  10 10   3. Vacuuming 5 6 8   Score 5 7 avg 8.33   Total score = sum of the activity scores/number of activities Minimum detectable change (90%CI) for average score = 2 points Minimum detectable change (90%CI) for single activity score = 3 points    COGNITIVE STATUS: 06/20/2023 Within functional limits for tasks assessed   SENSATION: 06/20/2023 [facility]  POSTURE:  06/20/2023 rounded shoulders, forward head, and increased thoracic kyphosis  HAND DOMINANCE:  06/20/2023 Right  GAIT: 06/20/23 Comments: independent   PALPATION: 07/19/2023: noted tenderness and tightness with trigger points in Rt upper trap and Rt infraspinatus c concordant symptoms.   06/20/23 tender to palpation to Rt proximal biceps tendon; lateral shoulder, infraspinatus, upper trap   CERVICAL ROM:   ROM A/PROM (deg) Eval 06/20/2023  Flexion 48  Extension 46 (pain)  Right lateral flexion 40  Left lateral flexion 29 (pain on Lt)  Right rotation 42  Left rotation 51   (Blank rows = not tested)   UPPER EXTREMITY  ROM:  ROM Right Eval 06/20/2023 Left Eval 06/20/2023 Right 07/26/23 (Sitting)  Shoulder flexion 161 (supine) 169 (sitting with pain)  160  Shoulder extension     Shoulder abduction 70 (supine) 112 (sitting)  165  Shoulder adduction     Shoulder extension     Shoulder internal rotation FIR: T11/12 with pain FIR: T11/12 FIR: T8  Shoulder external rotation 29 (supine)  90   (Blank rows = not tested)   UPPER EXTREMITY MMT:  MMT Right Eval 06/20/2023 Left Eval 06/20/2023  Shoulder flexion 3/5 with pain 4/5  Shoulder extension    Shoulder abduction 3/5 with pain 4/5  Shoulder adduction    Shoulder extension    Shoulder internal rotation 4/5 with pain 5/5  Shoulder external  rotation 3/5 with pain 5/5   (Blank rows = not tested)    SPECIAL TESTS:  06/20/23 Upper Extremity Impingement tests: Hawkins/Kennedy impingement test: positive  and Biceps assessment: Speed's test: positive                   TREATMENT 07/26/23 TherEx MMT and ROM assessment - see above for details Discussed current HEP as well as recommendations to continue HEP and could also shift to more general strengthening program if she continues to have decreased pain.    Self Care Educated on percussive device and precautions as well as current progress    07/19/2023 Therex:  UBE fwd/back 4 mins each way lvl 2.5 for ROM/tissue perfusion.  1 min rest between directions. Upper trap stretch 15 sec x 3 Rt   Chest corner stretch 15 sec x 3   Neuro re-ed (Postural awareness, activation and mobility) Seated scapular retraction 2 sec hold x 10 Standing green band rows c scapular retraction 2 x 10 Standing bilateral gh ext green band 2 x 10   Additional time spent for verbal and visual cues for exercise.   Manual Percussive device to Rt upper trap, Rt infraspinatus.  Discussion while performing about possible purchase for home if desired.    07/07/23 UBE L2.5 x4 min forward/4 min backward for w/u and tissue  perfusion 3 way reaches yellow TB x5 on the R, increased pain after 5 reps so stopped   Supine shoulder flexion 2# 2x10  Supine serratus punches 2# 2x10  Sidelying shoulder ABD 0# to 90* x10 Sidelying shoulder ER towel pinched to side 0# x10  Corner pec stretch 2x30 seconds  UT stretch 2x30 seconds B Levator stretch 2x30 seconds B  Thoracic flexion/extension for postural mobility x10   06/20/23 TherEx See HEP - demonstrated with trial reps performed PRN, min cues for comprehension    Self Care Educated on PT POC and clinical findings    PATIENT EDUCATION:  Education details: HEP Person educated: Patient Education method: Programmer, multimedia, Facilities manager, and Handouts Education comprehension: verbalized understanding, returned demonstration, and needs further education  HOME EXERCISE PROGRAM:  Access Code: [email or web address] URL: [email or web address] Date: 07/07/2023 Prepared by: Terrel Ferries  Exercises - Standing Backward Shoulder Rolls  - 2 x daily - 7 x weekly - 1 sets - 10 reps - Standing Scapular Retraction  - 2 x daily - 7 x weekly - 1 sets - 10 reps - 5 sec hold - Shoulder External Rotation and Scapular Retraction  - 1 x daily - 7 x weekly - 1 sets - 10 reps - 1-2 sec hold - Seated Scalenes Stretch  - 1 x daily - 7 x weekly - 3 sets - 10 reps - Supine Shoulder Flexion with Free Weight  - 1 x daily - 4-5 x weekly - 2 sets - 10 reps - Single Arm Serratus Punches in Supine with Dumbbell  - 1 x daily - 4-5 x weekly - 2 sets - 10 reps   ASSESSMENT:  CLINICAL IMPRESSION: Pt has met all goals at this time and is ready for d/c today from PT.  She plans to continue with strengthening program.  Will d/c PT today.  Arrival today with more symptoms but generally reported improvement compared to eval with patient specific functional scale assessment.   OBJECTIVE IMPAIRMENTS: decreased ROM, decreased strength, increased fascial restrictions, increased muscle spasms, impaired  UE functional use, postural dysfunction, and pain.   ACTIVITY LIMITATIONS: carrying, lifting, sleeping, bed mobility, bathing, dressing,  reach over head, and hygiene/grooming  PARTICIPATION LIMITATIONS: meal prep, cleaning, laundry, driving, shopping, community activity, and occupation  PERSONAL FACTORS: 3+ comorbidities: GERD, HLD, HTN, OA, bil TKA are also affecting patient's functional outcome.   REHAB POTENTIAL: Good  CLINICAL DECISION MAKING: Evolving/moderate complexity  EVALUATION COMPLEXITY: Moderate   GOALS: Goals reviewed with patient? Yes  SHORT TERM GOALS: Target date: 07/11/2023  Independent with initial HEP Goal status: met   LONG TERM GOALS: Target date: 08/01/2023  Independent with final HEP Goal status: MET 07/26/23  2.  PSFS score improved by 2 points Goal status: MET 07/26/23  3.  Rt shoulder ROM improved to WNL without increase in pain for improved function Goal status: MET 07/26/23  4.  Report pain < 4/10 with driving and reaching activities for improved function Goal status: MET 07/26/23  5.  Rt shoulder strength improved to 4/5 for improved ADLs and work responsibilities Goal status: MET 07/26/23    PLAN:  PT FREQUENCY: 1x/week  PT DURATION: 6 weeks  PLANNED INTERVENTIONS: 97164- PT Re-evaluation, 97110-Therapeutic exercises, 97530- Therapeutic activity, 97112- Neuromuscular re-education, 97535- Self Care, 16109- Manual therapy, [id number]- Aquatic Therapy, U0454- Electrical stimulation (unattended), 97035- Ultrasound, 09811- Ionotophoresis 4mg /ml Dexamethasone , Patient/Family education, Taping, Dry Needling, Joint mobilization, Cryotherapy, and Moist heat.  PLAN FOR NEXT SESSION:  d/c PT today    Marley Simmers, PT, DPT 07/26/23 12:15 PM    PHYSICAL THERAPY DISCHARGE SUMMARY  Visits from Start of Care: 4  Current functional level related to goals / functional outcomes: See above   Remaining deficits: See above   Education /  Equipment: HEP   Patient agrees to discharge. Patient goals were met. Patient is being discharged due to meeting the stated rehab goals.  Marley Simmers, PT, DPT 07/26/23 12:15 PM  [facility] [address] [place], [place], [address] Phone: [phone]   Fax:  [phone]

## 2023-08-15 ENCOUNTER — Encounter: Payer: Self-pay | Admitting: Family Medicine

## 2023-08-15 ENCOUNTER — Ambulatory Visit (INDEPENDENT_AMBULATORY_CARE_PROVIDER_SITE_OTHER): Admitting: Family Medicine

## 2023-08-15 VITALS — BP 105/56 | HR 88 | Ht 63.0 in | Wt 262.0 lb

## 2023-08-15 DIAGNOSIS — R202 Paresthesia of skin: Secondary | ICD-10-CM | POA: Diagnosis not present

## 2023-08-15 DIAGNOSIS — R200 Anesthesia of skin: Secondary | ICD-10-CM | POA: Diagnosis not present

## 2023-08-15 LAB — HEMOGLOBIN A1C: Hgb A1c MFr Bld: 5.9 % (ref 4.6–6.5)

## 2023-08-15 LAB — B12 AND FOLATE PANEL
Folate: >23.2 ng/mL (ref >5.9)
Vitamin B-12: 297 pg/mL (ref 211–911)

## 2023-08-15 NOTE — Progress Notes (Signed)
 Acute Office Visit  Subjective:     Patient ID: Kelly Perkins, female    DOB: [birth year], 73 y.o.   MRN: [id number]  Chief Complaint  Patient presents with   Medical Management of Chronic Issues    HPI Patient is in today for bilateral foot numbness/tingling.   Discussed the use of AI scribe software for clinical note transcription with the patient, who gave verbal consent to proceed.  History of Present Illness Kelly Perkins is a 73 year old female who presents with tingling and numbness in her feet.  She has been experiencing tingling and numbness in both feet for the past couple of weeks, primarily near her toes. The symptoms are more pronounced during activities such as grocery shopping. Over the past weekend, the tingling persisted even while at home and was more pronounced in her right foot.  She describes the sensation as a 'weird feeling' and has no swelling, loss of sensation, or feeling like she might trip and fall. The tingling occasionally extends slightly up her leg, but she does not experience any shooting pain down her legs like sciatica.  Her glucose was noted to be a little high during a previous visit, and A1c was ordered, but not completed.   No swelling, coldness, or feet fully falling asleep. She confirms feeling touch and being able to wiggle her toes without difficulty.      Lab Results  Component Value Date   HGBA1C 5.2 09/27/2019      ROS All review of systems negative except what is listed in the HPI      Objective:    BP (!) 105/56   Pulse 88   Ht 5\' 3"  (1.6 m)   Wt 262 lb (118.8 kg)   SpO2 98%   BMI 46.41 kg/m    Physical Exam Vitals reviewed.  Constitutional:      Appearance: Normal appearance.  Cardiovascular:     Pulses: Normal pulses.          Posterior tibial pulses are 2+ on the right side and 2+ on the left side.  Musculoskeletal:        General: No swelling or tenderness. Normal range of motion.     Right foot:  Normal range of motion.     Left foot: Normal range of motion.  Feet:     Right foot:     Skin integrity: Skin integrity normal.     Left foot:     Skin integrity: Skin integrity normal.  Skin:    General: Skin is warm and dry.     Findings: No bruising, erythema or rash.  Neurological:     Mental Status: She is alert and oriented to person, place, and time.  Psychiatric:        Mood and Affect: Mood normal.        Behavior: Behavior normal.        Thought Content: Thought content normal.        Judgment: Judgment normal.     No results found for any visits on 08/15/23.      Assessment & Plan:   Problem List Items Addressed This Visit   None Visit Diagnoses       Numbness and tingling of both feet    -  Primary   Relevant Orders   B12 and Folate Panel   Hemoglobin A1c      Assessment & Plan Foot numbness/tingling - Order A1c and vitamin B12 tests. -  Already established with sports medicine if needed. - Advise well-fitting shoes, stretching, massaging feet. - Discuss neurologist referral for nerve studies if symptoms worsen. - Instruct to report severe symptoms.        No orders of the defined types were placed in this encounter.   Return if symptoms worsen or fail to improve.  Everlina Hock, NP

## 2023-08-16 ENCOUNTER — Ambulatory Visit: Payer: Self-pay | Admitting: Family Medicine

## 2023-08-17 ENCOUNTER — Ambulatory Visit: Admitting: Family Medicine

## 2023-09-03 ENCOUNTER — Other Ambulatory Visit: Payer: Self-pay | Admitting: Internal Medicine

## 2023-09-13 ENCOUNTER — Encounter

## 2023-09-17 ENCOUNTER — Other Ambulatory Visit: Payer: Self-pay | Admitting: Internal Medicine

## 2023-09-17 DIAGNOSIS — K219 Gastro-esophageal reflux disease without esophagitis: Secondary | ICD-10-CM

## 2023-09-20 ENCOUNTER — Ambulatory Visit: Admitting: *Deleted

## 2023-09-20 VITALS — Ht 63.0 in | Wt 262.0 lb

## 2023-09-20 DIAGNOSIS — Z0000 Encounter for general adult medical examination without abnormal findings: Secondary | ICD-10-CM

## 2023-09-20 NOTE — Patient Instructions (Signed)
 [name] , Thank you for taking time out of your busy schedule to complete your Annual Wellness Visit with me. I enjoyed our conversation and look forward to speaking with you again next year. I, as well as your care team,  appreciate your ongoing commitment to your health goals. Please review the following plan we discussed and let me know if I can assist you in the future. Your Game plan/ To Do List    Follow up Visits: Next Medicare AWV with our clinical staff: 09/20/24 9:40    Next Office Visit with your provider: 12/27/23 11:20  Clinician Recommendations:  Aim for 30 minutes of exercise or brisk walking, 6-8 glasses of water, and 5 servings of fruits and vegetables each day.       This is a list of the screening recommended for you and due dates:  Health Maintenance  Topic Date Due   Medicare Annual Wellness Visit  08/31/2023   COVID-19 Vaccine (5 - Moderna risk 2024-25 season) 09/17/2024*   Flu Shot  10/13/2023   Mammogram  03/13/2025   Colon Cancer Screening  10/01/2025   DTaP/Tdap/Td vaccine (4 - Td or Tdap) 09/13/2033   Pneumococcal Vaccine for age over 37  Completed   DEXA scan (bone density measurement)  Completed   Hepatitis C Screening  Completed   Zoster (Shingles) Vaccine  Completed   Hepatitis B Vaccine  Aged Out   HPV Vaccine  Aged Out   Meningitis B Vaccine  Aged Out  *Topic was postponed. The date shown is not the original due date.    I have mailed a set of advanced directives to you. Please let me know if you do not receive them within 1 week. Once completed and notarized, you may return a copy of your Advanced Directive(s) by either of the following:  Advanced directives: (Copy Requested) Please bring a copy of your health care power of attorney and living will to the office to be added to your chart at your convenience. You can mail to [facility] [address] [place], [place] [address] or email to [email or web address] Advance Care  Planning is important because it:  [x]  Makes sure you receive the medical care that is consistent with your values, goals, and preferences  [x]  It provides guidance to your family and loved ones and reduces their decisional burden about whether or not they are making the right decisions based on your wishes.  Follow the link provided in your after visit summary or read over the paperwork we have mailed to you to help you started getting your Advance Directives in place. If you need assistance in completing these, please reach out to us  so that we can help you!  See attachments for Preventive Care and Fall Prevention Tips.

## 2023-09-20 NOTE — Progress Notes (Signed)
 Subjective:   Kelly Perkins is a 73 y.o. who presents for a Medicare Wellness preventive visit.  As a reminder, Annual Wellness Visits don't include a physical exam, and some assessments may be limited, especially if this visit is performed virtually. We may recommend an in-person follow-up visit with your provider if needed.  Visit Complete: Virtual I connected with  Kelly Perkins on 09/20/23 by a audio enabled telemedicine application and verified that I am speaking with the correct person using two identifiers.  Patient Location: Home  Provider Location: Office/Clinic  I discussed the limitations of evaluation and management by telemedicine. The patient expressed understanding and agreed to proceed.  Vital Signs: Because this visit was a virtual/telehealth visit, some criteria may be missing or patient reported. Any vitals not documented were not able to be obtained and vitals that have been documented are patient reported.  VideoDeclined- This patient declined Librarian, academic. Therefore the visit was completed with audio only.  Persons Participating in Visit: Patient.  AWV Questionnaire: No: Patient Medicare AWV questionnaire was not completed prior to this visit.  Cardiac Risk Factors include: advanced age (>68men, >86 women);hypertension;dyslipidemia     Objective:    Today's Vitals   09/20/23 0934  Weight: 262 lb (118.8 kg)  Height: 5' 3 (1.6 m)   Body mass index is 46.41 kg/m.     06/20/2023   11:15 AM 08/31/2022   11:02 AM 10/26/2021    2:07 PM 08/04/2021   11:03 AM 05/10/2021   11:18 AM 12/04/2017    3:11 PM 12/04/2017    8:11 AM  Advanced Directives  Does Patient Have a Medical Advance Directive? No No No No No  No   Would patient like information on creating a medical advance directive? No - Patient declined No - Patient declined No - Patient declined No - Patient declined  Yes (Inpatient - patient defers creating a medical advance  directive at this time)  No - Patient declined      Data saved with a previous flowsheet row definition    Current Medications (verified) Outpatient Encounter Medications as of 09/20/2023  Medication Sig   albuterol  (VENTOLIN  HFA) 108 (90 Base) MCG/ACT inhaler Inhale 2 puffs into the lungs every 6 (six) hours as needed for wheezing or shortness of breath.   cyclobenzaprine  (FLEXERIL ) 10 MG tablet Take 1 tablet (10 mg total) by mouth at bedtime as needed for muscle spasms.   dicyclomine  (BENTYL ) 10 MG capsule Take 1 capsule (10 mg total) by mouth 4 (four) times daily -  before meals and at bedtime.   levothyroxine  (SYNTHROID ) 137 MCG tablet TAKE 1 TABLET BY MOUTH DAILY BEFORE BREAKFAST.   lisinopril -hydrochlorothiazide  (ZESTORETIC ) 10-12.5 MG tablet Take 1 tablet by mouth daily.   meloxicam  (MOBIC ) 15 MG tablet Take 1 tablet (15 mg total) by mouth daily as needed for pain.   pantoprazole  (PROTONIX ) 40 MG tablet Take 1 tablet (40 mg total) by mouth daily.   simvastatin  (ZOCOR ) 40 MG tablet Take 1 tablet (40 mg total) by mouth at bedtime.   zolpidem  (AMBIEN ) 10 MG tablet TAKE 1 TABLET BY MOUTH AT BEDTIME AS NEEDED FOR SLEEP   No facility-administered encounter medications on file as of 09/20/2023.    Allergies (verified) Hydrocodone  bitartrate er   History: Past Medical History:  Diagnosis Date   GERD (gastroesophageal reflux disease)    on meds   History of kidney stones    Hyperlipidemia 09/10/2010   on meds  Hypertension    on meds   Hypothyroidism    on meds   Normal cardiac stress test 08/2010   had CP-DOE   Osteoarthritis    generalizd   Wears dentures    top   Wears glasses    Past Surgical History:  Procedure Laterality Date   ABDOMINAL HYSTERECTOMY  2000   B oophoreectomy, d/t DUB   COLONOSCOPY  2017   JP-MAC-5 yr recall-poor prep-normal   CYSTOSCOPY/URETEROSCOPY/HOLMIUM LASER/STENT PLACEMENT Right 11/17/2017   Procedure: CYSTOSCOPY/RIGHT URETEROSCOPY/RIGHT  HOLMIUM LASER/STENT PLACEMENT;  Surgeon: Devere Lonni Righter, MD;  Location: [facility];  Service: Urology;  Laterality: Right;   JOINT REPLACEMENT     LITHOTRIPSY  2004   urolthiasis    MASS EXCISION  09/12/2011   Procedure: EXCISION MASS;  Surgeon: Elna Pick, MD;  Location[facility];  Service: Plastics;  Laterality: Left;  left ring finger   POLYPECTOMY     TA 2004   THYROIDECTOMY Left 2006   TOTAL KNEE ARTHROPLASTY Left 11/17/2016   Procedure: LEFT TOTAL KNEE ARTHROPLASTY;  Surgeon: Jerri Kay HERO, MD;  Location: [facility];  Service: Orthopedics;  Laterality: Left;   TOTAL KNEE ARTHROPLASTY Right 12/04/2017   Procedure: RIGHT TOTAL KNEE ARTHROPLASTY;  Surgeon: Jerri Kay HERO, MD;  Location: [facility];  Service: Orthopedics;  Laterality: Right;   Family History  Problem Relation Age of Onset   Hypertension Mother    Coronary artery disease Mother        onset?   Hypertension Sister    Diabetes Neg Hx    Colon cancer Neg Hx    Breast cancer Neg Hx    Stroke Neg Hx    Rectal cancer Neg Hx    Stomach cancer Neg Hx    Colon polyps Neg Hx    Social History   Socioeconomic History   Marital status: Divorced    Spouse name: Not on file   Number of children: 2   Years of education: Not on file   Highest education level: GED or equivalent  Occupational History   Occupation: school bus driver. Retired-- Scientist, research (physical sciences)  Tobacco Use   Smoking status: Never   Smokeless tobacco: Never  Vaping Use   Vaping status: Never Used  Substance and Sexual Activity   Alcohol  use: Not Currently    Alcohol /week: 1.0 standard drink of alcohol     Types: 1 Standard drinks or equivalent per week   Drug use: Never   Sexual activity: Not Currently  Other Topics Concern   Not on file  Social History Narrative   Single, mom was one of my patients Ms. Kelly Cook.     lives by herself      Right Handed    Lives in a two story home   Social Drivers of Health   Financial Resource Strain:  Low Risk  (09/20/2023)   Overall Financial Resource Strain (CARDIA)    Difficulty of Paying Living Expenses: Not hard at all  Food Insecurity: No Food Insecurity (09/20/2023)   Hunger Vital Sign    Worried About Running Out of Food in the Last Year: Never true    Ran Out of Food in the Last Year: Never true  Transportation Needs: No Transportation Needs (09/20/2023)   PRAPARE - Administrator, Civil Service (Medical): No    Lack of Transportation (Non-Medical): No  Physical Activity: Insufficiently Active (09/20/2023)   Exercise Vital Sign    Days of Exercise per Week: 2  days    Minutes of Exercise per Session: 20 min  Stress: No Stress Concern Present (09/20/2023)   [employer] of Occupational Health - Occupational Stress Questionnaire    Feeling of Stress: Not at all  Social Connections: Socially Isolated (09/20/2023)   Social Connection and Isolation Panel    Frequency of Communication with Friends and Family: More than three times a week    Frequency of Social Gatherings with Friends and Family: Once a week    Attends Religious Services: Never    Database administrator or Organizations: No    Attends Engineer, structural: Never    Marital Status: Divorced    Tobacco Counseling Counseling given: Not Answered    Clinical Intake:  Pre-visit preparation completed: Yes  Pain : No/denies pain     BMI - recorded: 46.41 Nutritional Status: BMI > 30  Obese Nutritional Risks: Other (Comment) Diabetes: No  Lab Results  Component Value Date   HGBA1C 5.9 08/15/2023   HGBA1C 5.2 09/27/2019   HGBA1C 5.4 07/03/2017     How often do you need to have someone help you when you read instructions, pamphlets, or other written materials from your doctor or pharmacy?: 1 - Never What is the last grade level you completed in school?: GED  Interpreter Needed?: No  Information entered by :: Lolita Libra, CMA   Activities of Daily Living     09/20/2023    9:42  AM  In your present state of health, do you have any difficulty performing the following activities:  Hearing? 0  Vision? 0  Difficulty concentrating or making decisions? 0  Walking or climbing stairs? 0  Dressing or bathing? 0  Doing errands, shopping? 0  Preparing Food and eating ? N  Using the Toilet? N  In the past six months, have you accidently leaked urine? N  Do you have problems with loss of bowel control? N  Managing your Medications? N  Managing your Finances? N  Housekeeping or managing your Housekeeping? N    Patient Care Team: Amon Aloysius BRAVO, MD as PCP - General Devere Lonni Righter, MD as Consulting Physician (Urology) Jerri Kay HERO, MD as Attending Physician (Orthopedic Surgery) Patel, Donika K, DO as Consulting Physician (Neurology) [facility] (Ophthalmology)  I have updated your Care Teams any recent Medical Services you may have received from other providers in the past year.     Assessment:   This is a routine wellness examination for [facility].  Hearing/Vision screen Hearing Screening - Comments:: Denies hearing difficulties.  Vision Screening - Comments:: Sees [facility] and due for exam.   Goals Addressed               This Visit's Progress     Patient Stated (pt-stated)        To get out of debt and stop ordering from [employer].       Depression Screen     09/20/2023    9:44 AM 06/27/2023    3:41 PM 03/17/2023   11:02 AM 08/31/2022   11:05 AM 07/22/2022   10:40 AM 07/01/2022   11:20 AM 03/15/2022    9:59 AM  PHQ 2/9 Scores  PHQ - 2 Score 0 0 0 0 0 0 0  PHQ- 9 Score       0    Fall Risk     09/20/2023    9:52 AM 06/27/2023    3:41 PM 03/17/2023  11:02 AM 08/30/2022   11:41 AM 07/22/2022   10:40 AM  Fall Risk   Falls in the past year? 0 0 0 0 0  Number falls in past yr: 0 0 0 0 0  Injury with Fall? 0 0 0 0 0  Risk for fall due to : No Fall Risks   No Fall Risks   Follow up Education provided  Falls evaluation completed;Education provided Falls evaluation completed;Education provided Falls evaluation completed Falls evaluation completed    MEDICARE RISK AT HOME:  Medicare Risk at Home Any stairs in or around the home?: Yes If so, are there any without handrails?: No Home free of loose throw rugs in walkways, pet beds, electrical cords, etc?: Yes Adequate lighting in your home to reduce risk of falls?: Yes Life alert?: No Use of a cane, walker or w/c?: No Grab bars in the bathroom?: Yes Shower chair or bench in shower?: Yes Elevated toilet seat or a handicapped toilet?: No  TIMED UP AND GO:  Was the test performed?  No,audio  Cognitive Function: 6CIT completed        09/20/2023    9:48 AM 08/31/2022   11:06 AM 08/04/2021   11:07 AM  6CIT Screen  What Year? 0 points 0 points 0 points  What month? 0 points 0 points 0 points  What time? 0 points 0 points 0 points  Count back from 20 0 points 0 points 4 points  Months in reverse 0 points 0 points 0 points  Repeat phrase 4 points 6 points 2 points  Total Score 4 points 6 points 6 points    Immunizations Immunization History  Administered Date(s) Administered   Fluad Quad(high Dose 65+) 11/16/2018, 03/15/2022   Fluad Trivalent(High Dose 65+) 03/17/2023   Influenza Split 12/23/2013, 11/06/2014   Influenza, High Dose Seasonal PF 12/06/2017, 12/16/2020   Influenza-Unspecified 12/11/2015, 11/09/2016, 02/04/2020   Moderna Sars-Covid-2 Vaccination 04/12/2019, 05/10/2019, 01/18/2020   Pfizer(Comirnaty )Fall Seasonal Vaccine 12 years and older 03/17/2023   Pneumococcal Conjugate-13 06/22/2015   Pneumococcal Polysaccharide-23 01/03/2014, 11/16/2018   Respiratory Syncytial Virus Vaccine,Recomb Aduvanted(Arexvy) 09/27/2022   Td 04/13/2007, 07/03/2017   Tdap 09/14/2023   Zoster Recombinant(Shingrix ) 02/14/2019, 12/16/2020, 09/27/2022   Zoster, Live 08/25/2014    Screening Tests Health Maintenance  Topic Date Due    COVID-19 Vaccine (5 - Moderna risk 2024-25 season) 09/17/2024 (Originally 09/14/2023)   INFLUENZA VACCINE  10/13/2023   Medicare Annual Wellness (AWV)  09/19/2024   MAMMOGRAM  03/13/2025   Colonoscopy  10/01/2025   DTaP/Tdap/Td (4 - Td or Tdap) 09/13/2033   Pneumococcal Vaccine: 50+ Years  Completed   DEXA SCAN  Completed   Hepatitis C Screening  Completed   Zoster Vaccines- Shingrix   Completed   Hepatitis B Vaccines  Aged Out   HPV VACCINES  Aged Out   Meningococcal B Vaccine  Aged Out    Health Maintenance  There are no preventive care reminders to display for this patient.  Health Maintenance Items Addressed: All HM up to date.  Additional Screening:  Vision Screening: Recommended annual ophthalmology exams for early detection of glaucoma and other disorders of the eye. Would you like a referral to an eye doctor? No    Dental Screening: Recommended annual dental exams for proper oral hygiene  Community Resource Referral / Chronic Care Management: CRR required this visit?  No   CCM required this visit?  No   Plan:    I have personally reviewed and noted the following in the patient's  chart:   Medical and social history Use of alcohol , tobacco or illicit drugs  Current medications and supplements including opioid prescriptions. Patient is not currently taking opioid prescriptions. Functional ability and status Nutritional status Physical activity Advanced directives List of other physicians Hospitalizations, surgeries, and ER visits in previous 12 months Vitals Screenings to include cognitive, depression, and falls Referrals and appointments  In addition, I have reviewed and discussed with patient certain preventive protocols, quality metrics, and best practice recommendations. A written personalized care plan for preventive services as well as general preventive health recommendations were provided to patient.   Lolita Libra, CMA   09/20/2023   After Visit  Summary: (MyChart) Due to this being a telephonic visit, the after visit summary with patients personalized plan was offered to patient via MyChart   Notes: Nothing significant to report at this time.

## 2023-10-06 ENCOUNTER — Other Ambulatory Visit: Payer: Self-pay | Admitting: Internal Medicine

## 2023-10-09 ENCOUNTER — Ambulatory Visit: Payer: Self-pay | Admitting: Family Medicine

## 2023-10-09 ENCOUNTER — Ambulatory Visit (HOSPITAL_BASED_OUTPATIENT_CLINIC_OR_DEPARTMENT_OTHER)
Admission: RE | Admit: 2023-10-09 | Discharge: 2023-10-09 | Disposition: A | Discharge status: Home / Self Care | Source: Ambulatory Visit | Attending: Family Medicine | Admitting: Family Medicine

## 2023-10-09 ENCOUNTER — Ambulatory Visit: Payer: Self-pay

## 2023-10-09 ENCOUNTER — Ambulatory Visit (INDEPENDENT_AMBULATORY_CARE_PROVIDER_SITE_OTHER): Admitting: Family Medicine

## 2023-10-09 VITALS — BP 132/50 | HR 72 | Ht 63.0 in | Wt 269.0 lb

## 2023-10-09 DIAGNOSIS — R200 Anesthesia of skin: Secondary | ICD-10-CM

## 2023-10-09 DIAGNOSIS — R202 Paresthesia of skin: Secondary | ICD-10-CM

## 2023-10-09 DIAGNOSIS — M4316 Spondylolisthesis, lumbar region: Secondary | ICD-10-CM | POA: Diagnosis not present

## 2023-10-09 DIAGNOSIS — M545 Low back pain, unspecified: Secondary | ICD-10-CM

## 2023-10-09 DIAGNOSIS — G8929 Other chronic pain: Secondary | ICD-10-CM

## 2023-10-09 DIAGNOSIS — M47816 Spondylosis without myelopathy or radiculopathy, lumbar region: Secondary | ICD-10-CM | POA: Diagnosis not present

## 2023-10-09 DIAGNOSIS — N200 Calculus of kidney: Secondary | ICD-10-CM | POA: Diagnosis not present

## 2023-10-09 MED ORDER — GABAPENTIN 300 MG PO CAPS: 300.0000 mg | ORAL_CAPSULE | Freq: Two times a day (BID) | ORAL | 3 refills | Status: DC

## 2023-10-09 NOTE — Telephone Encounter (Signed)
 FYI Only or Action Required?: FYI only for provider.  Patient was last seen in primary care on 08/15/2023 by Almarie Waddell NOVAK, NP.  Called Nurse Triage reporting Foot Swelling.  Symptoms began several weeks ago.  Interventions attempted: Nothing.  Symptoms are: unchanged.  Triage Disposition: See Physician Within 24 Hours  Patient/caregiver understands and will follow disposition?: Yes                        Copied from CRM #[id number]. Topic: Clinical - Red Word Triage >> Oct 09, 2023  7:34 AM Berneda FALCON wrote: Red Word that prompted transfer to Nurse Triage: Pt states she has some back pain, tingling in legs and feet keep swelling up off and on for a couple of weeks. Not getting any better Reason for Disposition  [1] MODERATE leg swelling (e.g., swelling extends up to knees) AND [2] new-onset or getting worse  Answer Assessment - Initial Assessment Questions 1. ONSET: When did the swelling start? (e.g., minutes, hours, days)     On and off for 2 weeks 2. LOCATION: What part of the leg is swollen?  Are both legs swollen or just one leg?     Both feet 3. SEVERITY: How bad is the swelling? (e.g., localized; mild, moderate, severe)      mild 4. REDNESS: Is there redness or signs of infection?     denies 5. PAIN: Is the swelling painful to touch? If Yes, ask: How painful is it?   (Scale 1-10; mild, moderate or severe)     Pain back 9/10 6. FEVER: Do you have a fever? If Yes, ask: What is it, how was it measured, and when did it start?      denies 7. CAUSE: What do you think is causing the leg swelling?     no 8. MEDICAL HISTORY: Do you have a history of blood clots (e.g., DVT), cancer, heart failure, kidney disease, or liver failure?     denies 9. RECURRENT SYMPTOM: Have you had leg swelling before? If Yes, ask: When was the last time? What happened that time?     denies 10. OTHER SYMPTOMS: Do you have any other symptoms? (e.g., chest  pain, difficulty breathing)       no 11. PREGNANCY: Is there any chance you are pregnant? When was your last menstrual period?       na  Protocols used: Leg Swelling and Edema-A-AH

## 2023-10-09 NOTE — Progress Notes (Signed)
 Acute Office Visit  Subjective:     Patient ID: Kelly Perkins, female    DOB: [birth year], 73 y.o.   MRN: [id number]   HPI Patient is in today for feet numbness/tingling/pain.   Discussed the use of AI scribe software for clinical note transcription with the patient, who gave verbal consent to proceed.  History of Present Illness Kelly Perkins is a 73 year old female who presents with numbness and tingling in her feet.  She has been experiencing numbness and tingling in her feet since her last visit in June. The sensation is sometimes worse in one foot than the other, with her toes feeling tight. Occasionally, the numbness and tingling extend up her legs. Her feet swell at the end of the day, particularly in hot weather, and she sometimes uses compression socks to manage the swelling. She has been taking B12 supplements since her last visit due to a slightly low B12 level. Her A1c from prior labs was in the prediabetes range. She has not been on any nerve pain medications.  She reports significant back pain, rated as a 9 out of 10 in severity. The pain is described as 'hurting stuff' in her back, sometimes making it difficult to get out of bed in the morning. The pain moves from side to side and is exacerbated by prolonged sitting. No recent injuries or falls, and no pain shooting down her legs. She is currently taking meloxicam  for pain management.       ROS All review of systems negative except what is listed in the HPI      Objective:    BP (!) 132/50   Pulse 72   Ht 5' 3 (1.6 m)   Wt 269 lb (122 kg)   SpO2 100%   BMI 47.65 kg/m    Physical Exam Vitals reviewed.  Constitutional:      General: She is not in acute distress.    Appearance: Normal appearance. She is obese. She is not ill-appearing.  Cardiovascular:     Pulses:          Dorsalis pedis pulses are 2+ on the right side and 2+ on the left side.  Feet:     Right foot:     Skin integrity: No ulcer, skin  breakdown or erythema.     Left foot:     Skin integrity: No ulcer, skin breakdown or erythema.     Comments: Intact sensation to touch bilateral feet; no pitting edema today  Skin:    General: Skin is warm and dry.  Neurological:     Mental Status: She is alert and oriented to person, place, and time.  Psychiatric:        Mood and Affect: Mood normal.        Behavior: Behavior normal.        Thought Content: Thought content normal.        Judgment: Judgment normal.         No results found for any visits on 10/09/23.      Assessment & Plan:   Problem List Items Addressed This Visit   None Visit Diagnoses       Numbness and tingling of both feet    -  Primary Chronic numbness and tingling in feet and legs. Borderline low B12, prediabetes range A1c. Suspected neuropathy, further evaluation needed. - Refer to neurology for nerve conduction studies. - Continue B12 supplementation. - Prescribe gabapentin , start one capsule daily for a  week, then increase to twice daily if tolerated. Monitor for drowsiness or dizziness.   Relevant Medications   gabapentin  (NEURONTIN ) 300 MG capsule   Other Relevant Orders   Ambulatory referral to Neurology     Chronic bilateral low back pain, unspecified whether sciatica present     Chronic bilateral back pain, possibly related to disc issues or nerve impingement. X-ray planned for structural evaluation. - Order a back x-ray. - Keep spine specialist appointment on August 20th.   Relevant Medications   gabapentin  (NEURONTIN ) 300 MG capsule   Other Relevant Orders   DG Lumbar Spine 2-3 Views (Completed)          Meds ordered this encounter  Medications   gabapentin  (NEURONTIN ) 300 MG capsule    Sig: Take 1 capsule (300 mg total) by mouth 2 (two) times daily. Start with once a day for the first week to see if tolerating.    Dispense:  60 capsule    Refill:  3    Supervising Provider:   DOMENICA BLACKBIRD A [[id number]]    Return if  symptoms worsen or fail to improve.  Kelly Perkins Mon, NP

## 2023-10-20 ENCOUNTER — Telehealth: Payer: Self-pay

## 2023-10-20 NOTE — Telephone Encounter (Signed)
 Copied from CRM [id number]. Topic: Clinical - Medication Question >> Oct 20, 2023  3:09 PM Taleah C wrote: Reason for CRM: pt called to ask Dr. Amon If he can prescribe her a different medication because the Meloxicam  is not working for her arthritis. Please advise.

## 2023-10-22 NOTE — Telephone Encounter (Signed)
 Please call and advise patient: Stop meloxicam  Celebrex  200 mg 1 tablet daily as needed, send a prescription for 3 months. Follow the same GI precautions like with any other NSAID. Encouraged the use of Tylenol  500 mg 2 tablets every 8 hours as needed. Keep appointment with orthopedics.

## 2023-10-23 ENCOUNTER — Ambulatory Visit: Admitting: Neurology

## 2023-10-23 ENCOUNTER — Encounter: Payer: Self-pay | Admitting: Neurology

## 2023-10-23 VITALS — BP 173/98 | HR 82 | Ht 63.0 in | Wt 276.0 lb

## 2023-10-23 DIAGNOSIS — R200 Anesthesia of skin: Secondary | ICD-10-CM | POA: Diagnosis not present

## 2023-10-23 DIAGNOSIS — R202 Paresthesia of skin: Secondary | ICD-10-CM

## 2023-10-23 MED ORDER — CELECOXIB 200 MG PO CAPS: 200.0000 mg | ORAL_CAPSULE | Freq: Every day | ORAL | 0 refills | Status: DC | PRN

## 2023-10-23 NOTE — Progress Notes (Signed)
 Follow-up Visit   Date: 10/23/2023    Kelly Perkins MRN: [id number] DOB: May 06, 1950    Kelly Perkins is a 73 y.o. right-handed female with hypertension, hyperlipidemia, and hypothyroidism returning to the clinic for follow-up of bilateral feet tingling.  The patient was accompanied to the clinic by self.   IMPRESSION/PLAN: Bilateral feet and lower extremity paresthesias.  Neurological exam shows normal distal sensation and strength.  Reflexes are diminished throughout.  Although neuropathy is possible, it would be unusual to involve the lower leg and feet at the same time as this tends to be a length-dependent process.  She may have left lumbar radiculopathy and will be seeing spine specialist for this.  To evaluate her paresthesias, NCS/EMG of the legs will be ordered.   --------------------------------------------- UPDATE 10/23/2023:  For the past several month, she has numbness/tingling in the toes up to her knees, which is constant.  She sometimes has difficulty in the right leg which prohibits her from walking.  She also complaints of left leg pain.  She has tried meloxicam  and celebrex  for pain.  She also tried gabapentin  which she stopped due to side effects (diarrhea).  She started vitamin B12 in July but has not noticed any change.   Medications:  Current Outpatient Medications on File Prior to Visit  Medication Sig Dispense Refill   albuterol  (VENTOLIN  HFA) 108 (90 Base) MCG/ACT inhaler Inhale 2 puffs into the lungs every 6 (six) hours as needed for wheezing or shortness of breath. 18 g 5   celecoxib  (CELEBREX ) 200 MG capsule Take 1 capsule (200 mg total) by mouth daily as needed. 90 capsule 0   cyclobenzaprine  (FLEXERIL ) 10 MG tablet Take 1 tablet (10 mg total) by mouth at bedtime as needed for muscle spasms. 90 tablet 0   dicyclomine  (BENTYL ) 10 MG capsule Take 1 capsule (10 mg total) by mouth 4 (four) times daily -  before meals and at bedtime. 40 capsule 0    levothyroxine  (SYNTHROID ) 137 MCG tablet TAKE 1 TABLET BY MOUTH DAILY BEFORE BREAKFAST. 90 tablet 1   lisinopril -hydrochlorothiazide  (ZESTORETIC ) 10-12.5 MG tablet Take 1 tablet by mouth daily. 90 tablet 1   pantoprazole  (PROTONIX ) 40 MG tablet Take 1 tablet (40 mg total) by mouth daily. 90 tablet 1   simvastatin  (ZOCOR ) 40 MG tablet Take 1 tablet (40 mg total) by mouth at bedtime. 90 tablet 1   zolpidem  (AMBIEN ) 10 MG tablet TAKE 1 TABLET BY MOUTH EVERY DAY AT BEDTIME AS NEEDED FOR SLEEP 30 tablet 1   gabapentin  (NEURONTIN ) 300 MG capsule Take 1 capsule (300 mg total) by mouth 2 (two) times daily. Start with once a day for the first week to see if tolerating. (Patient not taking: Reported on 10/23/2023) 60 capsule 3   No current facility-administered medications on file prior to visit.    Allergies:  Allergies  Allergen Reactions   Hydrocodone  Bitartrate Er     Itchy-rash mild    Vital Signs:  BP (!) 173/98   Pulse 82   Ht 5' 3 (1.6 m)   Wt 276 lb (125.2 kg)   SpO2 98%   BMI 48.89 kg/m   Neurological Exam: MENTAL STATUS including orientation to time, place, person, recent and remote memory, attention span and concentration, language, and fund of knowledge is normal.  Speech is not dysarthric.  CRANIAL NERVES:  No visual field defects.  Pupils equal round and reactive to light.  Normal conjugate, extra-ocular eye movements in all directions of  gaze.  No ptosis.  Face is symmetric. Palate elevates symmetrically.  Tongue is midline.  MOTOR:  Motor strength is 5/5 in all extremities.  No atrophy, fasciculations or abnormal movements.  No pronator drift.  Tone is normal.    MSRs:  Reflexes are 1+/4 throughout.  SENSORY:  Intact to vibration, pin prick, and temperature throughout.  COORDINATION/GAIT:  Normal finger-to- nose-finger.  Intact rapid alternating movements bilaterally.  Gait wide-based, antalgic appearing, unassisted.   Data: Lab Results  Component Value Date   HGBA1C  5.9 08/15/2023   Lab Results  Component Value Date   VITAMINB12 297 08/15/2023   Lab Results  Component Value Date   FOLATE >23.2 08/15/2023     Thank you for allowing me to participate in patient's care.  If I can answer any additional questions, I would be pleased to do so.    Sincerely,    Barbi Kumagai K. Tobie, DO

## 2023-10-23 NOTE — Telephone Encounter (Signed)
 Patient notified and celebrex  sent in.

## 2023-10-23 NOTE — Patient Instructions (Signed)
Nerve testing of both legs.  ELECTROMYOGRAM AND NERVE CONDUCTION STUDIES (EMG/NCS) INSTRUCTIONS  How to Prepare The neurologist conducting the EMG will need to know if you have certain medical conditions. Tell the neurologist and other EMG lab personnel if you: . Have a pacemaker or any other electrical medical device . Take blood-thinning medications . Have hemophilia, a blood-clotting disorder that causes prolonged bleeding Bathing Take a shower or bath shortly before your exam in order to remove oils from your skin. Don't apply lotions or creams before the exam.  What to Expect You'll likely be asked to change into a hospital gown for the procedure and lie down on an examination table. The following explanations can help you understand what will happen during the exam.  . Electrodes. The neurologist or a technician places surface electrodes at various locations on your skin depending on where you're experiencing symptoms. Or the neurologist may insert needle electrodes at different sites depending on your symptoms.  . Sensations. The electrodes will at times transmit a tiny electrical current that you may feel as a twinge or spasm. The needle electrode may cause discomfort or pain that usually ends shortly after the needle is removed. If you are concerned about discomfort or pain, you may want to talk to the neurologist about taking a short break during the exam.  . Instructions. During the needle EMG, the neurologist will assess whether there is any spontaneous electrical activity when the muscle is at rest - activity that isn't present in healthy muscle tissue - and the degree of activity when you slightly contract the muscle.  He or she will give you instructions on resting and contracting a muscle at appropriate times. Depending on what muscles and nerves the neurologist is examining, he or she may ask you to change positions during the exam.  After your EMG You may experience some  temporary, minor bruising where the needle electrode was inserted into your muscle. This bruising should fade within several days. If it persists, contact your primary care doctor.    

## 2023-10-25 ENCOUNTER — Encounter: Payer: Self-pay | Admitting: Emergency Medicine

## 2023-10-25 ENCOUNTER — Ambulatory Visit
Admission: EM | Admit: 2023-10-25 | Discharge: 2023-10-25 | Disposition: A | Discharge status: Home / Self Care | Attending: Family Medicine | Admitting: Family Medicine

## 2023-10-25 DIAGNOSIS — M7062 Trochanteric bursitis, left hip: Secondary | ICD-10-CM

## 2023-10-25 MED ORDER — KETOROLAC TROMETHAMINE 15 MG/ML IJ SOLN
15.0000 mg | Freq: Once | INTRAMUSCULAR | Status: AC
  Administered 2023-10-25 (×2): 15 mg via INTRAMUSCULAR

## 2023-10-25 MED ORDER — DEXAMETHASONE SODIUM PHOSPHATE 10 MG/ML IJ SOLN
10.0000 mg | Freq: Once | INTRAMUSCULAR | Status: AC
  Administered 2023-10-25 (×2): 10 mg via INTRAMUSCULAR

## 2023-10-25 NOTE — ED Triage Notes (Signed)
 Pt here for L hip pain x3 weeks. No recent injuries or recurrent hx of this pain. Aggravated with all movement. Relieved slightly if she sits straight up. Pain is 10/10. Reports no radiation into back or down leg. Pt states she recently saw her neurologist this week due to ongoing concerns numbness and tingling in her feet. Using tylenol  arthritis, icy hot, and heating pad with no relief.

## 2023-10-25 NOTE — Discharge Instructions (Signed)
 Meds ordered this encounter  Medications   dexamethasone  (DECADRON ) injection 10 mg   ketorolac  (TORADOL ) 15 MG/ML injection 15 mg

## 2023-10-25 NOTE — ED Provider Notes (Signed)
 [facility]   [id number] 10/25/23 Arrival Time: 1009  ASSESSMENT & PLAN:  1. Trochanteric bursitis of left hip    No indication for plain imaging at this time. Suspect trochanteric bursitis. Discussed.  Trial of: Meds ordered this encounter  Medications   dexamethasone  (DECADRON ) injection 10 mg   ketorolac  (TORADOL ) 15 MG/ML injection 15 mg   Work/school excuse note: provided. Recommend:  Follow-up Information     [facility].   Why: If worsening or failing to improve as anticipated. Contact information: [address] [place]  [address] [phone]                Activities as tolerated.  Reviewed expectations re: course of current medical issues. Questions answered. Outlined signs and symptoms indicating need for more acute intervention. Patient verbalized understanding. After Visit Summary given.  SUBJECTIVE: History from: patient. Kelly Perkins is a 73 y.o. female who reports non-traumatic non-radiating LEFT lateral hip pain; gradual onset; x 3 weeks; now fairly persistent; difficulty getting comfortable. Denies extremity sensation changes or weakness. Desc pain as aching. Worse at night. OTC analgesics without much help.  Past Surgical History:  Procedure Laterality Date   ABDOMINAL HYSTERECTOMY  2000   B oophoreectomy, d/t DUB   COLONOSCOPY  2017   JP-MAC-5 yr recall-poor prep-normal   CYSTOSCOPY/URETEROSCOPY/HOLMIUM LASER/STENT PLACEMENT Right 11/17/2017   Procedure: CYSTOSCOPY/RIGHT URETEROSCOPY/RIGHT HOLMIUM LASER/STENT PLACEMENT;  Surgeon: Devere Lonni Righter, MD;  Location: [facility];  Service: Urology;  Laterality: Right;   JOINT REPLACEMENT     LITHOTRIPSY  2004   urolthiasis    MASS EXCISION  09/12/2011   Procedure: EXCISION MASS;  Surgeon: Elna Pick, MD;  Location: [facility];  Service: Plastics;  Laterality: Left;  left ring finger   POLYPECTOMY     TA  2004   THYROIDECTOMY Left 2006   TOTAL KNEE ARTHROPLASTY Left 11/17/2016   Procedure: LEFT TOTAL KNEE ARTHROPLASTY;  Surgeon: Jerri Kay HERO, MD;  Location: [facility];  Service: Orthopedics;  Laterality: Left;   TOTAL KNEE ARTHROPLASTY Right 12/04/2017   Procedure: RIGHT TOTAL KNEE ARTHROPLASTY;  Surgeon: Jerri Kay HERO, MD;  Location: [facility];  Service: Orthopedics;  Laterality: Right;      OBJECTIVE:  Vitals:   10/25/23 1108 10/25/23 1114  BP: (!) 167/80 (!) 149/85  Pulse: 63   Resp: 16   Temp: 97.9 F (36.6 C)   TempSrc: Oral   SpO2: 96%     General appearance: alert; no distress HEENT: [place]; AT Neck: supple with FROM Resp: unlabored respirations Extremities: LLE: warm with well perfused appearance; TTP directly over greater trochanter distribution Skin: warm and dry; no visible rashes Neurologic: gait normal but favors LLE; normal sensation and strength of bilateral LE Psychological: alert and cooperative; normal mood and affect  Imaging: No results found.    Allergies  Allergen Reactions   Gabapentin      Swelling, breathing issues, diarrhea    Hydrocodone  Bitartrate Er     Itchy-rash mild    Past Medical History:  Diagnosis Date   GERD (gastroesophageal reflux disease)    on meds   History of kidney stones    Hyperlipidemia 09/10/2010   on meds   Hypertension    on meds   Hypothyroidism    on meds   Normal cardiac stress test 08/2010   had CP-DOE   Osteoarthritis    generalizd   Wears dentures    top  Wears glasses    Social History   Socioeconomic History   Marital status: Divorced    Spouse name: Not on file   Number of children: 2   Years of education: Not on file   Highest education level: GED or equivalent  Occupational History   Occupation: school bus driver. Retired-- Scientist, research (physical sciences)  Tobacco Use   Smoking status: Never   Smokeless tobacco: Never  Vaping Use   Vaping status: Never Used  Substance and Sexual Activity   Alcohol  use: Not Currently     Alcohol /week: 1.0 standard drink of alcohol     Types: 1 Standard drinks or equivalent per week   Drug use: Never   Sexual activity: Not Currently  Other Topics Concern   Not on file  Social History Narrative   Single, mom was one of my patients Ms. Ronal Cook.     lives by herself      Right Handed    Lives in a two story home   Lives alone    Social Drivers of Health   Financial Resource Strain: Low Risk  (10/09/2023)   Overall Financial Resource Strain (CARDIA)    Difficulty of Paying Living Expenses: Not hard at all  Food Insecurity: No Food Insecurity (10/09/2023)   Hunger Vital Sign    Worried About Running Out of Food in the Last Year: Never true    Ran Out of Food in the Last Year: Never true  Transportation Needs: No Transportation Needs (10/09/2023)   PRAPARE - Administrator, Civil Service (Medical): No    Lack of Transportation (Non-Medical): No  Physical Activity: Insufficiently Active (10/09/2023)   Exercise Vital Sign    Days of Exercise per Week: 3 days    Minutes of Exercise per Session: 30 min  Stress: No Stress Concern Present (09/20/2023)   [employer] of Occupational Health - Occupational Stress Questionnaire    Feeling of Stress: Not at all  Social Connections: Socially Isolated (10/09/2023)   Social Connection and Isolation Panel    Frequency of Communication with Friends and Family: Once a week    Frequency of Social Gatherings with Friends and Family: Twice a week    Attends Religious Services: Patient declined    Database administrator or Organizations: No    Attends Engineer, structural: Not on file    Marital Status: Divorced   Family History  Problem Relation Age of Onset   Hypertension Mother    Coronary artery disease Mother        onset?   Hypertension Sister    Diabetes Neg Hx    Colon cancer Neg Hx    Breast cancer Neg Hx    Stroke Neg Hx    Rectal cancer Neg Hx    Stomach cancer Neg Hx    Colon polyps Neg  Hx    Past Surgical History:  Procedure Laterality Date   ABDOMINAL HYSTERECTOMY  2000   B oophoreectomy, d/t DUB   COLONOSCOPY  2017   JP-MAC-5 yr recall-poor prep-normal   CYSTOSCOPY/URETEROSCOPY/HOLMIUM LASER/STENT PLACEMENT Right 11/17/2017   Procedure: CYSTOSCOPY/RIGHT URETEROSCOPY/RIGHT HOLMIUM LASER/STENT PLACEMENT;  Surgeon: Devere Lonni Righter, MD;  Location: [facility];  Service: Urology;  Laterality: Right;   JOINT REPLACEMENT     LITHOTRIPSY  2004   urolthiasis    MASS EXCISION  09/12/2011   Procedure: EXCISION MASS;  Surgeon: Elna Pick, MD;  Location: [facility];  Service: Plastics;  Laterality: Left;  left  ring finger   POLYPECTOMY     TA 2004   THYROIDECTOMY Left 2006   TOTAL KNEE ARTHROPLASTY Left 11/17/2016   Procedure: LEFT TOTAL KNEE ARTHROPLASTY;  Surgeon: Jerri Kay HERO, MD;  Location: [facility];  Service: Orthopedics;  Laterality: Left;   TOTAL KNEE ARTHROPLASTY Right 12/04/2017   Procedure: RIGHT TOTAL KNEE ARTHROPLASTY;  Surgeon: Jerri Kay HERO, MD;  Location: [facility];  Service: Orthopedics;  Laterality: Right;       Rolinda Rogue, MD 10/25/23 1141

## 2023-10-27 ENCOUNTER — Encounter: Payer: Self-pay | Admitting: Internal Medicine

## 2023-10-27 ENCOUNTER — Ambulatory Visit (INDEPENDENT_AMBULATORY_CARE_PROVIDER_SITE_OTHER): Admitting: Internal Medicine

## 2023-10-27 VITALS — BP 166/90 | HR 82 | Temp 97.5°F | Resp 18 | Ht 63.0 in | Wt 273.5 lb

## 2023-10-27 DIAGNOSIS — M5442 Lumbago with sciatica, left side: Secondary | ICD-10-CM | POA: Diagnosis not present

## 2023-10-27 DIAGNOSIS — E039 Hypothyroidism, unspecified: Secondary | ICD-10-CM

## 2023-10-27 DIAGNOSIS — I160 Hypertensive urgency: Secondary | ICD-10-CM

## 2023-10-27 DIAGNOSIS — M7062 Trochanteric bursitis, left hip: Secondary | ICD-10-CM

## 2023-10-27 DIAGNOSIS — R399 Unspecified symptoms and signs involving the genitourinary system: Secondary | ICD-10-CM | POA: Diagnosis not present

## 2023-10-27 DIAGNOSIS — I10 Essential (primary) hypertension: Secondary | ICD-10-CM | POA: Diagnosis not present

## 2023-10-27 DIAGNOSIS — R195 Other fecal abnormalities: Secondary | ICD-10-CM | POA: Diagnosis not present

## 2023-10-27 MED ORDER — AMLODIPINE BESYLATE 5 MG PO TABS: 5.0000 mg | ORAL_TABLET | Freq: Every day | ORAL | 1 refills | Status: DC

## 2023-10-27 NOTE — Assessment & Plan Note (Signed)
 Low back pain, left. Pain for the last 4 weeks, some radiation to the buttock and leg, she is tender at the trochanteric bursa. DDx radiculopathy versus trochanteric bursa.  Went to urgent care, got only brief relief with IM Toradol /steroid injection.  Has taken meloxicam , Celebrex : No help.  Also complaining of dark stools. Plan: Keep appointment with Ortho next week.  Stop NSAIDs.  Tylenol .  Check CBC. LUTS: Urinary frequency without other LUTS.  Check a UA urine culture.  No urinary incontinence on clinical grounds. HTN: BP noted to be elevated for the first time since 10/23/2023 at neurology, no readings prior to that.  Reports good compliance with Zestoretic . BP possibly elevated due to pain or NSAIDs. Plan: BMP, add amlodipine  (hopefully only temporarily). Dark stools: Without other GI symptoms, has been taking NSAIDs.  Stop NSAIDs, check CBC, call if she has stomach pain or stools continue to be dark. Hypothyroidism: On Synthroid , last TSH okay RTC 3 months

## 2023-10-27 NOTE — Patient Instructions (Addendum)
 Stop Celebrex , meloxicam . Take only Tylenol   500 mg OTC 2 tabs a day every 8 hours as needed for pain    Start checking your blood pressure regularly Blood pressure goal:  between 110/65 and  135/85. If it is consistently higher or lower, let me know     GO TO THE LAB :  Get the blood work   Your results will be posted on MyChart with my comments  Next office visit for a checkup in 3 months Please make an appointment before you leave today

## 2023-10-27 NOTE — Progress Notes (Signed)
 Subjective:    Patient ID: Kelly Perkins, female    DOB: 1950-05-15, 73 y.o.   MRN: [id number]  DOS:  10/27/2023 Type of visit - description: Follow-up  Chronic medical problems addressed.  Other issues addressed as well >>>>  4-week history of pain, initially only at the lower left back, now radiating to the left buttock, left hip and left posterior thigh. Increase with certain  movements.  Does not decrease by lying down. Denies any fall, no fever or chills. I ask about paresthesias and she has bilateral lower extremity paresthesias, not worse since the pain started.  NCS pending per neurology.  No urinary or bowel incontinence but reports urinary frequency without dysuria. Also report dark stools.  Denies epigastric pain, no nausea or vomiting.  No blood in the stools.  Review of Systems See above   Past Medical History:  Diagnosis Date   GERD (gastroesophageal reflux disease)    on meds   History of kidney stones    Hyperlipidemia 09/10/2010   on meds   Hypertension    on meds   Hypothyroidism    on meds   Normal cardiac stress test 08/2010   had CP-DOE   Osteoarthritis    generalizd   Wears dentures    top   Wears glasses     Past Surgical History:  Procedure Laterality Date   ABDOMINAL HYSTERECTOMY  2000   B oophoreectomy, d/t DUB   COLONOSCOPY  2017   JP-MAC-5 yr recall-poor prep-normal   CYSTOSCOPY/URETEROSCOPY/HOLMIUM LASER/STENT PLACEMENT Right 11/17/2017   Procedure: CYSTOSCOPY/RIGHT URETEROSCOPY/RIGHT HOLMIUM LASER/STENT PLACEMENT;  Surgeon: Devere Lonni Righter, MD;  Location: [facility];  Service: Urology;  Laterality: Right;   JOINT REPLACEMENT     LITHOTRIPSY  2004   urolthiasis    MASS EXCISION  09/12/2011   Procedure: EXCISION MASS;  Surgeon: Elna Pick, MD;  Location: [facility];  Service: Plastics;  Laterality: Left;  left ring finger   POLYPECTOMY     TA 2004   THYROIDECTOMY Left 2006   TOTAL KNEE ARTHROPLASTY  Left 11/17/2016   Procedure: LEFT TOTAL KNEE ARTHROPLASTY;  Surgeon: Jerri Kay HERO, MD;  Location: [facility];  Service: Orthopedics;  Laterality: Left;   TOTAL KNEE ARTHROPLASTY Right 12/04/2017   Procedure: RIGHT TOTAL KNEE ARTHROPLASTY;  Surgeon: Jerri Kay HERO, MD;  Location: [facility];  Service: Orthopedics;  Laterality: Right;    Current Outpatient Medications  Medication Instructions   albuterol  (VENTOLIN  HFA) 108 (90 Base) MCG/ACT inhaler 2 puffs, Inhalation, Every 6 hours PRN   celecoxib  (CELEBREX ) 200 mg, Oral, Daily PRN   cyclobenzaprine  (FLEXERIL ) 10 mg, Oral, At bedtime PRN   dicyclomine  (BENTYL ) 10 mg, Oral, 3 times daily before meals & bedtime   levothyroxine  (SYNTHROID ) 137 mcg, Oral, Daily before breakfast   lisinopril -hydrochlorothiazide  (ZESTORETIC ) 10-12.5 MG tablet 1 tablet, Oral, Daily   pantoprazole  (PROTONIX ) 40 mg, Oral, Daily   simvastatin  (ZOCOR ) 40 mg, Oral, Daily at bedtime   zolpidem  (AMBIEN ) 10 MG tablet TAKE 1 TABLET BY MOUTH EVERY DAY AT BEDTIME AS NEEDED FOR SLEEP       Objective:   Physical Exam BP (!) 183/98   Pulse 82   Temp (!) 97.5 F (36.4 C) (Oral)   Resp 18   Ht 5' 3 (1.6 m)   Wt 273 lb 8 oz (124.1 kg)   SpO2 95%   BMI 48.45 kg/m  General:   Well developed, NAD, BMI noted. HEENT:  Normocephalic . Face  symmetric, atraumatic MSK: No TTP at the lumbosacral spine or SI joint.  + TTP at the left trochanteric bursa. Lower extremities: no pretibial edema bilaterally  Skin: Not pale. Not jaundice Neurologic:  alert & oriented X3.  Speech normal, gait and transferring antalgic. Motor and DTR symmetric. Psych--  Cognition and judgment appear intact.  Cooperative with normal attention span and concentration.  Behavior appropriate. No anxious or depressed appearing.      Assessment     ASSESSMENT HTN Hyperlipidemia Insomnia-- ambien  Hypothyroidism, s/p thyroidectomy Morbid obesity GERD Mild anemia, normal iron 12-2013, cscope 08-2015 DJD   h/o kidney stones  CP-DOE: Normal stress test 08/2010, Normal stress test 06/07/2022   PLAN: Low back pain, left. Pain for the last 4 weeks, some radiation to the buttock and leg, she is tender at the trochanteric bursa. DDx radiculopathy versus trochanteric bursa.  Went to urgent care, got only brief relief with IM Toradol /steroid injection.  Has taken meloxicam , Celebrex : No help.  Also complaining of dark stools. Plan: Keep appointment with Ortho next week.  Stop NSAIDs.  Tylenol .  Check CBC. LUTS: Urinary frequency without other LUTS.  Check a UA urine culture.  No urinary incontinence on clinical grounds. HTN: BP noted to be elevated for the first time since 10/23/2023 at neurology, no readings prior to that.  Reports good compliance with Zestoretic . BP possibly elevated due to pain or NSAIDs. Plan: BMP, add amlodipine  (hopefully only temporarily). Dark stools: Without other GI symptoms, has been taking NSAIDs.  Stop NSAIDs, check CBC, call if she has stomach pain or stools continue to be dark. Hypothyroidism: On Synthroid , last TSH okay RTC 3 months

## 2023-10-29 ENCOUNTER — Ambulatory Visit: Payer: Self-pay | Admitting: Internal Medicine

## 2023-10-30 ENCOUNTER — Other Ambulatory Visit (INDEPENDENT_AMBULATORY_CARE_PROVIDER_SITE_OTHER)

## 2023-10-30 DIAGNOSIS — I160 Hypertensive urgency: Secondary | ICD-10-CM | POA: Diagnosis not present

## 2023-10-30 DIAGNOSIS — R195 Other fecal abnormalities: Secondary | ICD-10-CM

## 2023-10-30 LAB — URINE CULTURE
MICRO NUMBER:: 16838209
Result:: NO GROWTH
SPECIMEN QUALITY:: ADEQUATE

## 2023-10-30 LAB — URINALYSIS, ROUTINE W REFLEX MICROSCOPIC

## 2023-10-31 LAB — CBC WITH DIFFERENTIAL/PLATELET
Basophils Absolute: 0 K/uL (ref 0.0–0.1)
Basophils Relative: 0.5 % (ref 0.0–3.0)
Eosinophils Absolute: 0.2 K/uL (ref 0.0–0.7)
Eosinophils Relative: 3.6 % (ref 0.0–5.0)
HCT: 35 % — ABNORMAL LOW (ref 36.0–46.0)
Hemoglobin: 11.7 g/dL — ABNORMAL LOW (ref 12.0–15.0)
Lymphocytes Relative: 32.5 % (ref 12.0–46.0)
Lymphs Abs: 2.1 K/uL (ref 0.7–4.0)
MCHC: 33.3 g/dL (ref 30.0–36.0)
MCV: 93.8 fl (ref 78.0–100.0)
Monocytes Absolute: 0.7 K/uL (ref 0.1–1.0)
Monocytes Relative: 11.1 % (ref 3.0–12.0)
Neutro Abs: 3.4 K/uL (ref 1.4–7.7)
Neutrophils Relative %: 52.3 % (ref 43.0–77.0)
Platelets: 157 K/uL (ref 150.0–400.0)
RBC: 3.74 Mil/uL — ABNORMAL LOW (ref 3.87–5.11)
RDW: 13.6 % (ref 11.5–15.5)
WBC: 6.4 K/uL (ref 4.0–10.5)

## 2023-10-31 LAB — BASIC METABOLIC PANEL WITH GFR
BUN: 16 mg/dL (ref 6–23)
CO2: 26 meq/L (ref 19–32)
Calcium: 9.3 mg/dL (ref 8.4–10.5)
Chloride: 102 meq/L (ref 96–112)
Creatinine, Ser: 0.91 mg/dL (ref 0.40–1.20)
GFR: 61.97 mL/min (ref >60.00)
Glucose, Bld: 93 mg/dL (ref 70–99)
Potassium: 4 meq/L (ref 3.5–5.1)
Sodium: 138 meq/L (ref 135–145)

## 2023-11-01 ENCOUNTER — Ambulatory Visit: Admitting: Orthopedic Surgery

## 2023-11-01 ENCOUNTER — Other Ambulatory Visit (INDEPENDENT_AMBULATORY_CARE_PROVIDER_SITE_OTHER): Payer: Self-pay

## 2023-11-01 VITALS — BP 152/81 | HR 86 | Ht 63.0 in | Wt 174.0 lb

## 2023-11-01 DIAGNOSIS — M545 Low back pain, unspecified: Secondary | ICD-10-CM

## 2023-11-01 MED ORDER — METHYLPREDNISOLONE 4 MG PO TBPK
ORAL_TABLET | ORAL | 0 refills | Status: DC
Start: 2023-11-01 — End: 2023-11-14

## 2023-11-01 MED ORDER — PREGABALIN 75 MG PO CAPS
75.0000 mg | ORAL_CAPSULE | Freq: Two times a day (BID) | ORAL | 1 refills | Status: AC
Start: 2023-11-01 — End: 2023-12-31

## 2023-11-01 NOTE — Progress Notes (Signed)
 Orthopedic Spine Surgery Office Note  Assessment: Patient is a 73 y.o. female with ow back pain that radiates into the left lateral thigh and leg and into the right buttock. 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 Discussed that the conservative treatments include:  -activity modification  -physical therapy  -over the counter pain medications  -medrol  dosepak  -lumbar steroid injections -Patient has tried tylenol , intramuscular steroid injection  -Prescribed a Medrol  Dosepak and gabapentin  for additional pain relief.  Told her she can keep using the Tylenol  -If she is not doing any better at her next visit, will order an MRI of the lumbar spine to evaluate for radiculopathy -Patient should return to office in 6 weeks, x-rays at next visit: None   Patient expressed understanding of the plan and all questions were answered to the patient's satisfaction.   ___________________________________________________________________________   History:  Patient is a 73 y.o. female who presents today for lumbar spine.  Patient has had about 3 months of low back pain that radiates into her bilateral lower extremities.  She feels the going into the lateral aspect of her thigh and leg on the left. On the right, she feels it going into the buttock.  There was no trauma or injury that preceded the onset of the pain.  She said the pain is felt on a daily basis.  She notes it with activity and at rest.  She has not found relief with Tylenol  and an intramuscular steroid injection.   Weakness: denies Symptoms of imbalance: denies Paresthesias and numbness: denies Bowel or bladder incontinence: denies Saddle anesthesia: denies  Treatments tried: tylenol , intramuscular steroid injection   Review of systems: Denies fevers and chills, night sweats, unexplained  weight loss, history of cancer.  Has had pain that wakes her at night  Past medical history: HLD HTN Hypothyroidism GERD  Allergies: gabapentin , hydrocodone   Past surgical history:  Bilateral TKA Hysterectomy Lithotripsy  Social history: Denies use of nicotine product (smoking, vaping, patches, smokeless) Alcohol  use: denies Denies recreational drug use   Physical Exam:  BMI of 30.8  General: no acute distress, appears stated age Neurologic: alert, answering questions appropriately, following commands Respiratory: unlabored breathing on room air, symmetric chest rise Psychiatric: appropriate affect, normal cadence to speech   MSK (spine):  -Strength exam      Left  Right EHL    5/5  5/5 TA    5/5  5/5 GSC    5/5  5/5 Knee extension  5/5  5/5 Hip flexion   5/5  5/5  -Sensory exam    Sensation intact to light touch in L3-S1 nerve distributions of bilateral lower extremities  -Achilles DTR: 1/4 on the left, 1/4 on the right -Patellar tendon DTR: 1/4 on the left, 1/4 on the right  -Straight leg raise: negative bilaterally -Clonus: no beats bilaterally  -Left hip exam: no pain through range of motion -Right hip exam: no pain through rang of motion  Imaging: XRs of the lumbar spine from 11/01/2023 were independently reviewed and interpreted, showing disc height loss and spondylolisthesis at L4/5. No other significant degenerative changes seen. No fracture or dislocation seen.    Patient name: Kelly Perkins Patient MRN: [id number] Date of visit: 11/01/23

## 2023-11-06 ENCOUNTER — Other Ambulatory Visit: Payer: Self-pay | Admitting: Orthopedic Surgery

## 2023-11-14 ENCOUNTER — Encounter: Payer: Self-pay | Admitting: Internal Medicine

## 2023-11-14 ENCOUNTER — Ambulatory Visit: Admitting: Internal Medicine

## 2023-11-14 ENCOUNTER — Telehealth: Payer: Self-pay | Admitting: Orthopedic Surgery

## 2023-11-14 ENCOUNTER — Ambulatory Visit: Payer: Self-pay

## 2023-11-14 VITALS — BP 136/86 | HR 94 | Temp 98.4°F | Resp 20 | Ht 63.0 in | Wt 268.0 lb

## 2023-11-14 DIAGNOSIS — J9801 Acute bronchospasm: Secondary | ICD-10-CM

## 2023-11-14 DIAGNOSIS — J40 Bronchitis, not specified as acute or chronic: Secondary | ICD-10-CM | POA: Diagnosis not present

## 2023-11-14 DIAGNOSIS — J069 Acute upper respiratory infection, unspecified: Secondary | ICD-10-CM | POA: Diagnosis not present

## 2023-11-14 LAB — POCT INFLUENZA A/B
Influenza A, POC: NEGATIVE
Influenza B, POC: NEGATIVE

## 2023-11-14 LAB — POC COVID19 BINAXNOW: SARS Coronavirus 2 Ag: NEGATIVE

## 2023-11-14 MED ORDER — ALBUTEROL SULFATE HFA 108 (90 BASE) MCG/ACT IN AERS: 2.0000 | INHALATION_SPRAY | Freq: Four times a day (QID) | RESPIRATORY_TRACT | 5 refills | Status: AC | PRN

## 2023-11-14 MED ORDER — TRAMADOL HCL 50 MG PO TABS: 50.0000 mg | ORAL_TABLET | Freq: Four times a day (QID) | ORAL | 0 refills | Status: AC | PRN

## 2023-11-14 MED ORDER — PREDNISONE 10 MG PO TABS
ORAL_TABLET | ORAL | 0 refills | Status: DC
Start: 2023-11-14 — End: 2023-11-20

## 2023-11-14 MED ORDER — BUDESONIDE-FORMOTEROL FUMARATE 160-4.5 MCG/ACT IN AERO: 2.0000 | INHALATION_SPRAY | Freq: Two times a day (BID) | RESPIRATORY_TRACT | 3 refills | Status: AC

## 2023-11-14 MED ORDER — ALBUTEROL SULFATE HFA 108 (90 BASE) MCG/ACT IN AERS: 2.0000 | INHALATION_SPRAY | Freq: Four times a day (QID) | RESPIRATORY_TRACT | 5 refills | Status: DC | PRN

## 2023-11-14 MED ORDER — AZITHROMYCIN 250 MG PO TABS
ORAL_TABLET | ORAL | 0 refills | Status: DC
Start: 2023-11-14 — End: 2023-11-20

## 2023-11-14 NOTE — Assessment & Plan Note (Signed)
 Bronchitis, bronchospasm. Symptoms started 2 days ago, she has a history of reactive airway disease. Ran out of albuterol . Flu and COVID tests negative. Plan: Symbicort  twice daily, RF albuterol . Recently had a round of prednisone  about 2 weeks ago but at this point I think she needs more prednisone , see prescription. Mucinex  DM. Start Zithromax  if not gradually better See AVS. Follow-up scheduled for 12/27/2023

## 2023-11-14 NOTE — Telephone Encounter (Signed)
 Patient called and ask if she could get something for pain for her back. [phone]

## 2023-11-14 NOTE — Progress Notes (Signed)
 Subjective:    Patient ID: Kelly Perkins, female    DOB: 08-31-1950, 73 y.o.   MRN: [id number]  DOS:  11/14/2023 Type of visit - description: acute   Symptoms started 2 days ago: Cough, chest congestion, wheezing. Also some sputum production, yellow, no hemoptysis. Denies fever or aches (other than radicular pain). No nausea vomiting No chest pain or difficulty breathing.   Review of Systems See above   Past Medical History:  Diagnosis Date   GERD (gastroesophageal reflux disease)    on meds   History of kidney stones    Hyperlipidemia 09/10/2010   on meds   Hypertension    on meds   Hypothyroidism    on meds   Normal cardiac stress test 08/2010   had CP-DOE   Osteoarthritis    generalizd   Wears dentures    top   Wears glasses     Past Surgical History:  Procedure Laterality Date   ABDOMINAL HYSTERECTOMY  2000   B oophoreectomy, d/t DUB   COLONOSCOPY  2017   JP-MAC-5 yr recall-poor prep-normal   CYSTOSCOPY/URETEROSCOPY/HOLMIUM LASER/STENT PLACEMENT Right 11/17/2017   Procedure: CYSTOSCOPY/RIGHT URETEROSCOPY/RIGHT HOLMIUM LASER/STENT PLACEMENT;  Surgeon: Devere Lonni Righter, MD;  Location: [facility];  Service: Urology;  Laterality: Right;   JOINT REPLACEMENT     LITHOTRIPSY  2004   urolthiasis    MASS EXCISION  09/12/2011   Procedure: EXCISION MASS;  Surgeon: Elna Pick, MD;  Location: [facility];  Service: Plastics;  Laterality: Left;  left ring finger   POLYPECTOMY     TA 2004   THYROIDECTOMY Left 2006   TOTAL KNEE ARTHROPLASTY Left 11/17/2016   Procedure: LEFT TOTAL KNEE ARTHROPLASTY;  Surgeon: Jerri Kay HERO, MD;  Location: [facility];  Service: Orthopedics;  Laterality: Left;   TOTAL KNEE ARTHROPLASTY Right 12/04/2017   Procedure: RIGHT TOTAL KNEE ARTHROPLASTY;  Surgeon: Jerri Kay HERO, MD;  Location: [facility];  Service: Orthopedics;  Laterality: Right;    Current Outpatient Medications  Medication Instructions   albuterol  (VENTOLIN   HFA) 108 (90 Base) MCG/ACT inhaler 2 puffs, Inhalation, Every 6 hours PRN   amLODipine  (NORVASC ) 5 mg, Oral, Daily   azithromycin  (ZITHROMAX  Z-PAK) 250 MG tablet 2 tabs a day the first day, then 1 tab a day x 4 days   budesonide -formoterol  (SYMBICORT ) 160-4.5 MCG/ACT inhaler 2 puffs, Inhalation, 2 times daily   celecoxib  (CELEBREX ) 200 mg, Oral, Daily PRN   cyclobenzaprine  (FLEXERIL ) 10 mg, Oral, At bedtime PRN   dicyclomine  (BENTYL ) 10 mg, Oral, 3 times daily before meals & bedtime   levothyroxine  (SYNTHROID ) 137 mcg, Oral, Daily before breakfast   lisinopril -hydrochlorothiazide  (ZESTORETIC ) 10-12.5 MG tablet 1 tablet, Oral, Daily   pantoprazole  (PROTONIX ) 40 mg, Oral, Daily   predniSONE  (DELTASONE ) 10 MG tablet 3 tabs x 3 days, 2 tabs x 3 days, 1 tab x 3 days   pregabalin  (LYRICA ) 75 mg, Oral, 2 times daily   simvastatin  (ZOCOR ) 40 mg, Oral, Daily at bedtime   traMADol  (ULTRAM ) 50 mg, Oral, Every 6 hours PRN   zolpidem  (AMBIEN ) 10 MG tablet TAKE 1 TABLET BY MOUTH EVERY DAY AT BEDTIME AS NEEDED FOR SLEEP       Objective:   Physical Exam BP 136/86   Pulse 94   Temp 98.4 F (36.9 C) (Oral)   Resp 20   Ht 5' 3 (1.6 m)   Wt 268 lb (121.6 kg)   SpO2 97%   BMI 47.47 kg/m  General:  Well developed, NAD, BMI noted. HEENT:  Normocephalic . Face symmetric, atraumatic. TMs: Normal Nose: Very congested Throat: Not red, no white patches Lungs:  Bilateral rhonchi, + mild to moderate wheezes bilaterally Normal respiratory effort, no intercostal retractions, no accessory muscle use. Heart: RRR,  no murmur.  Lower extremities: no pretibial edema bilaterally  Skin: Not pale. Not jaundice Neurologic:  alert & oriented X3.  Speech normal, gait appropriate for age and unassisted Psych--  Cognition and judgment appear intact.  Cooperative with normal attention span and concentration.  Behavior appropriate. No anxious or depressed appearing.      Assessment      ASSESSMENT HTN Hyperlipidemia Insomnia-- ambien  Hypothyroidism, s/p thyroidectomy Morbid obesity GERD Mild anemia, normal iron 12-2013, cscope 08-2015 DJD  h/o kidney stones  CP-DOE: Normal stress test 08/2010, Normal stress test 06/07/2022  Reactive Airway Dz (RAD)  PLAN: Bronchitis, bronchospasm. Symptoms started 2 days ago, she has a history of reactive airway disease. Ran out of albuterol . Flu and COVID tests negative. Plan: Symbicort  twice daily, RF albuterol . Recently had a round of prednisone  about 2 weeks ago but at this point I think she needs more prednisone , see prescription. Mucinex  DM. Start Zithromax  if not gradually better See AVS. Follow-up scheduled for 12/27/2023

## 2023-11-14 NOTE — Addendum Note (Signed)
 Addended by: GEORGINA SHARPER on: 11/14/2023 10:31 AM   Modules accepted: Orders

## 2023-11-14 NOTE — Telephone Encounter (Signed)
 I called and lmom advising her of Dr. Jeraline message

## 2023-11-14 NOTE — Patient Instructions (Addendum)
  Rest, fluids , tylenol   For cough:  Take Mucinex  DM or Robitussin-DM OTC.  Follow the instructions in the box.  For wheezing: Symbicort  2 puffs twice daily every day until next visit Albuterol  2 puffs every 6 hours as needed for persistent coughing wheezing  For nasal congestion: -Use over-the-counter Flonase : 2 nasal sprays on each side of the nose in the morning until you feel better    Avoid decongestants such as  Pseudoephedrine or phenylephrine    Take the antibiotic as prescribed if not improving in the next few days  Call if not gradually better over the next  10 days   Call anytime if the symptoms are severe, you have high fever, short of breath, chest pain

## 2023-11-19 ENCOUNTER — Other Ambulatory Visit: Payer: Self-pay | Admitting: Internal Medicine

## 2023-11-20 ENCOUNTER — Encounter: Payer: Self-pay | Admitting: Family Medicine

## 2023-11-20 ENCOUNTER — Ambulatory Visit (INDEPENDENT_AMBULATORY_CARE_PROVIDER_SITE_OTHER): Admitting: Family Medicine

## 2023-11-20 ENCOUNTER — Ambulatory Visit: Payer: Self-pay

## 2023-11-20 VITALS — BP 139/75 | HR 98 | Ht 63.0 in | Wt 265.0 lb

## 2023-11-20 DIAGNOSIS — J40 Bronchitis, not specified as acute or chronic: Secondary | ICD-10-CM | POA: Diagnosis not present

## 2023-11-20 DIAGNOSIS — K921 Melena: Secondary | ICD-10-CM

## 2023-11-20 DIAGNOSIS — R062 Wheezing: Secondary | ICD-10-CM | POA: Diagnosis not present

## 2023-11-20 DIAGNOSIS — K644 Residual hemorrhoidal skin tags: Secondary | ICD-10-CM

## 2023-11-20 MED ORDER — IPRATROPIUM-ALBUTEROL 0.5-2.5 (3) MG/3ML IN SOLN
3.0000 mL | Freq: Once | RESPIRATORY_TRACT | Status: AC
  Administered 2023-11-20: 3 mL via RESPIRATORY_TRACT

## 2023-11-20 MED ORDER — BENZONATATE 200 MG PO CAPS: 200.0000 mg | ORAL_CAPSULE | Freq: Two times a day (BID) | ORAL | 0 refills | Status: DC | PRN

## 2023-11-20 MED ORDER — HYDROCORTISONE (PERIANAL) 2.5 % EX CREA: 1.0000 | TOPICAL_CREAM | Freq: Two times a day (BID) | CUTANEOUS | 0 refills | Status: DC

## 2023-11-20 NOTE — Progress Notes (Signed)
 Acute Office Visit  Subjective:     Patient ID: Kelly Perkins, female    DOB: [birth year], 73 y.o.   MRN: [id number]  Chief Complaint  Patient presents with   Rectal Bleeding     Patient is in today for rectal bleeding.   Discussed the use of AI scribe software for clinical note transcription with the patient, who gave verbal consent to proceed.  History of Present Illness Kelly Perkins is a 73 year old female who presents with bright red blood in her stool and a persistent cough.  She experienced bright red blood in her stool on Saturday, initially with a significant amount and subsequently with just a drop. She had a bowel movement since then, but it was normal. No straining, constipation, or pain during bowel movements. No abdominal pain, rectal fullness, or itching.  She has had a persistent cough for the past week, beginning with cold-like symptoms. The cough is productive with yellow sputum. She was previously diagnosed with bronchitis and was prescribed prednisone  and a Z-Pak. She has been using an albuterol  inhaler PRN. There is slight improvement with the treatment, but the cough and wheezing persists. She has two to three more days of prednisone  left to complete.          All review of systems negative except what is listed in the HPI      Objective:    BP 139/75   Pulse 98   Ht 5' 3 (1.6 m)   Wt 265 lb (120.2 kg)   SpO2 98%   BMI 46.94 kg/m    Physical Exam Vitals reviewed. Exam conducted with a chaperone present.  Constitutional:      Appearance: Normal appearance. She is obese.  Cardiovascular:     Rate and Rhythm: Normal rate and regular rhythm.     Heart sounds: Normal heart sounds.  Pulmonary:     Effort: Pulmonary effort is normal.     Breath sounds: Wheezing present. No rhonchi or rales.  Genitourinary:    Rectum: Guaiac result positive. External hemorrhoid and internal hemorrhoid present.  Skin:    General: Skin is warm and dry.   Neurological:     Mental Status: She is alert and oriented to person, place, and time.  Psychiatric:        Mood and Affect: Mood normal.        Behavior: Behavior normal.        Thought Content: Thought content normal.        Judgment: Judgment normal.     No results found for any visits on 11/20/23.      Assessment & Plan:   Problem List Items Addressed This Visit   None Visit Diagnoses       Bloody stool    -  Primary   Relevant Medications   hydrocortisone  (ANUSOL -HC) 2.5 % rectal cream   Other Relevant Orders   CBC w/Diff     Wheezing       Relevant Medications   ipratropium-albuterol  (DUONEB) 0.5-2.5 (3) MG/3ML nebulizer solution 3 mL (Completed)   benzonatate  (TESSALON ) 200 MG capsule     Bronchitis         External hemorrhoids            Assessment & Plan Hemorrhoids Bright red blood in stool with two episodes of bleeding. No abdominal pain or constipation. Rectal exam showed two external hemorrhoids and possibly one internal hemorrhoid. Differential includes hemorrhoidal bleeding vs GI bleed. -  Prescribed hemorrhoid cream for external hemorrhoids. - Follow-up with your GI provider - Check CBC today for stability - Follow-up sooner if symptoms persist or worsen.    Acute bronchitis Persistent cough with yellow sputum for one week. Wheezing present, no crackles. Improvement with prednisone , but cough persists. Just finished Zpak, still on prednisone  taper. - Administer breathing treatment in office. - Continue prednisone  as prescribed. - Advise to use albuterol  inhaler as needed. - Instruct to increase fluid intake and use Mucinex . - Advise to report if symptoms do not improve by Thursday or if fever develops. - Adding Tessalon  for cough  Patient aware of signs/symptoms requiring further/urgent evaluation.       Meds ordered this encounter  Medications   ipratropium-albuterol  (DUONEB) 0.5-2.5 (3) MG/3ML nebulizer solution 3 mL   benzonatate   (TESSALON ) 200 MG capsule    Sig: Take 1 capsule (200 mg total) by mouth 2 (two) times daily as needed for cough.    Dispense:  20 capsule    Refill:  0    Supervising Provider:   DOMENICA BLACKBIRD A [[id number]]   hydrocortisone  (ANUSOL -HC) 2.5 % rectal cream    Sig: Place 1 Application rectally 2 (two) times daily.    Dispense:  30 g    Refill:  0    Supervising Provider:   DOMENICA BLACKBIRD A [[id number]]    Return if symptoms worsen or fail to improve.  [name] Mon, NP

## 2023-11-20 NOTE — Telephone Encounter (Signed)
 FYI Only or Action Required?: Action required by provider: request for appointment.  Patient was last seen in primary care on 11/14/2023 by Amon Aloysius BRAVO, MD.  Called Nurse Triage reporting Rectal Bleeding.  Symptoms began several days ago.  Interventions attempted: Nothing.  Symptoms are: unchanged. Over the weekend has seen blood with every stool - bright red. In toilet water and when she wipes. No pain.  Triage Disposition: See PCP When Office is Open (Within 3 Days)  Patient/caregiver understands and will follow disposition?: Yes    Copied from CRM #[id number]. Topic: Clinical - Red Word Triage >> Nov 20, 2023  8:00 AM Robinson H wrote: [facility] that prompted transfer to Nurse Triage: Blood coming out of rectum Reason for Disposition  MILD rectal bleeding (e.g., more than just a few drops or streaks)  Answer Assessment - Initial Assessment Questions 1. APPEARANCE of BLOOD: What color is it? Is it passed separately, on the surface of the stool, or mixed in with the stool?      Bright red 2. AMOUNT: How much blood was passed?      Has slowed down 3. FREQUENCY: How many times has blood been passed with the stools?      several 4. ONSET: When was the blood first seen in the stools? (Days or weeks)      weekend 5. DIARRHEA: Is there also some diarrhea? If Yes, ask: How many diarrhea stools in the past 24 hours?      no 6. CONSTIPATION: Do you have constipation? If Yes, ask: How bad is it?     no 7. RECURRENT SYMPTOMS: Have you had blood in your stools before? If Yes, ask: When was the last time? and What happened that time?      no 8. BLOOD THINNERS: Do you take any blood thinners? (e.g., aspirin , clopidogrel / Plavix, coumadin, heparin ). Notes: Other strong blood thinners include: Arixtra (fondaparinux), Eliquis (apixaban), Pradaxa (dabigatran), and Xarelto (rivaroxaban).     no 9. OTHER SYMPTOMS: Do you have any other symptoms?  (e.g., abdomen pain,  vomiting, dizziness, fever)     no 10. PREGNANCY: Is there any chance you are pregnant? When was your last menstrual period?       no  Protocols used: Rectal Bleeding-A-AH

## 2023-11-21 DIAGNOSIS — M545 Low back pain, unspecified: Secondary | ICD-10-CM | POA: Diagnosis not present

## 2023-11-21 LAB — CBC WITH DIFFERENTIAL/PLATELET
Basophils Absolute: 0 K/uL (ref 0.0–0.1)
Basophils Relative: 0.5 % (ref 0.0–3.0)
Eosinophils Absolute: 0.1 K/uL (ref 0.0–0.7)
Eosinophils Relative: 1.1 % (ref 0.0–5.0)
HCT: 36.6 % (ref 36.0–46.0)
Hemoglobin: 12.1 g/dL (ref 12.0–15.0)
Lymphocytes Relative: 18.8 % (ref 12.0–46.0)
Lymphs Abs: 1.6 K/uL (ref 0.7–4.0)
MCHC: 33.1 g/dL (ref 30.0–36.0)
MCV: 93.7 fl (ref 78.0–100.0)
Monocytes Absolute: 0.6 K/uL (ref 0.1–1.0)
Monocytes Relative: 6.4 % (ref 3.0–12.0)
Neutro Abs: 6.3 K/uL (ref 1.4–7.7)
Neutrophils Relative %: 73.2 % (ref 43.0–77.0)
Platelets: 216 K/uL (ref 150.0–400.0)
RBC: 3.91 Mil/uL (ref 3.87–5.11)
RDW: 13.7 % (ref 11.5–15.5)
WBC: 8.6 K/uL (ref 4.0–10.5)

## 2023-11-22 ENCOUNTER — Ambulatory Visit: Payer: Self-pay | Admitting: Family Medicine

## 2023-12-08 ENCOUNTER — Telehealth: Payer: Self-pay | Admitting: Family

## 2023-12-11 NOTE — Telephone Encounter (Unsigned)
 Copied from CRM [id number]. Topic: Clinical - Medication Question >> Dec 11, 2023  4:11 PM Drema MATSU wrote: Reason for CRM: Patient called to check on her refill for AMBIEN .

## 2023-12-12 NOTE — Telephone Encounter (Signed)
 Requesting: Ambien  10mg   Contract: 08/05/22 UDS: Ambien  only Last Visit: 11/14/23 Next Visit: 12/27/23 Last Refill: 10/07/23 #30 and 1RF   Please Advise

## 2023-12-12 NOTE — Telephone Encounter (Signed)
 PDMP reviewed. On zolpidem  regularly, was recently prescribed a small amount of tramadol .  Okay to refill.

## 2023-12-13 ENCOUNTER — Ambulatory Visit: Admitting: Orthopedic Surgery

## 2023-12-14 ENCOUNTER — Encounter (HOSPITAL_COMMUNITY): Payer: Self-pay

## 2023-12-14 ENCOUNTER — Emergency Department (HOSPITAL_COMMUNITY): Payer: Self-pay

## 2023-12-14 ENCOUNTER — Emergency Department (HOSPITAL_COMMUNITY)

## 2023-12-14 ENCOUNTER — Emergency Department (HOSPITAL_COMMUNITY)
Admission: EM | Admit: 2023-12-14 | Discharge: 2023-12-14 | Disposition: A | Discharge status: Home / Self Care | Attending: Emergency Medicine | Admitting: Emergency Medicine

## 2023-12-14 ENCOUNTER — Encounter: Admitting: Neurology

## 2023-12-14 ENCOUNTER — Other Ambulatory Visit: Payer: Self-pay

## 2023-12-14 ENCOUNTER — Encounter: Payer: Self-pay | Admitting: Neurology

## 2023-12-14 ENCOUNTER — Telehealth: Payer: Self-pay

## 2023-12-14 ENCOUNTER — Emergency Department (HOSPITAL_COMMUNITY)
Admission: EM | Admit: 2023-12-14 | Discharge: 2023-12-14 | Discharge status: Absent without leave | Payer: Self-pay | Source: Home / Self Care | Attending: Emergency Medicine | Admitting: Emergency Medicine

## 2023-12-14 DIAGNOSIS — M545 Low back pain, unspecified: Secondary | ICD-10-CM | POA: Diagnosis present

## 2023-12-14 DIAGNOSIS — M5441 Lumbago with sciatica, right side: Secondary | ICD-10-CM

## 2023-12-14 DIAGNOSIS — M5431 Sciatica, right side: Secondary | ICD-10-CM

## 2023-12-14 DIAGNOSIS — M4807 Spinal stenosis, lumbosacral region: Secondary | ICD-10-CM | POA: Diagnosis not present

## 2023-12-14 DIAGNOSIS — Z79899 Other long term (current) drug therapy: Secondary | ICD-10-CM | POA: Diagnosis not present

## 2023-12-14 DIAGNOSIS — I10 Essential (primary) hypertension: Secondary | ICD-10-CM | POA: Insufficient documentation

## 2023-12-14 DIAGNOSIS — Z8673 Personal history of transient ischemic attack (TIA), and cerebral infarction without residual deficits: Secondary | ICD-10-CM | POA: Insufficient documentation

## 2023-12-14 DIAGNOSIS — M47817 Spondylosis without myelopathy or radiculopathy, lumbosacral region: Secondary | ICD-10-CM | POA: Diagnosis not present

## 2023-12-14 DIAGNOSIS — M47816 Spondylosis without myelopathy or radiculopathy, lumbar region: Secondary | ICD-10-CM | POA: Diagnosis not present

## 2023-12-14 DIAGNOSIS — M48061 Spinal stenosis, lumbar region without neurogenic claudication: Secondary | ICD-10-CM | POA: Diagnosis not present

## 2023-12-14 DIAGNOSIS — M1611 Unilateral primary osteoarthritis, right hip: Secondary | ICD-10-CM | POA: Diagnosis not present

## 2023-12-14 DIAGNOSIS — M25551 Pain in right hip: Secondary | ICD-10-CM | POA: Diagnosis not present

## 2023-12-14 LAB — CBC WITH DIFFERENTIAL/PLATELET
Abs Immature Granulocytes: 0.03 K/uL (ref 0.00–0.07)
Abs Immature Granulocytes: 0.03 K/uL (ref 0.00–0.07)
Basophils Absolute: 0 K/uL (ref 0.0–0.1)
Basophils Absolute: 0 K/uL (ref 0.0–0.1)
Basophils Relative: 1 %
Basophils Relative: 1 %
Eosinophils Absolute: 0.1 K/uL (ref 0.0–0.5)
Eosinophils Absolute: 0.2 K/uL (ref 0.0–0.5)
Eosinophils Relative: 3 %
Eosinophils Relative: 4 %
HCT: 33.1 % — ABNORMAL LOW (ref 36.0–46.0)
HCT: 33.2 % — ABNORMAL LOW (ref 36.0–46.0)
Hemoglobin: 11.1 g/dL — ABNORMAL LOW (ref 12.0–15.0)
Hemoglobin: 11.2 g/dL — ABNORMAL LOW (ref 12.0–15.0)
Immature Granulocytes: 1 %
Immature Granulocytes: 1 %
Lymphocytes Relative: 29 %
Lymphocytes Relative: 31 %
Lymphs Abs: 1.6 K/uL (ref 0.7–4.0)
Lymphs Abs: 1.6 K/uL (ref 0.7–4.0)
MCH: 31.4 pg (ref 26.0–34.0)
MCH: 31.7 pg (ref 26.0–34.0)
MCHC: 33.5 g/dL (ref 30.0–36.0)
MCHC: 33.7 g/dL (ref 30.0–36.0)
MCV: 93.8 fL (ref 80.0–100.0)
MCV: 94.1 fL (ref 80.0–100.0)
Monocytes Absolute: 0.6 K/uL (ref 0.1–1.0)
Monocytes Absolute: 0.7 K/uL (ref 0.1–1.0)
Monocytes Relative: 12 %
Monocytes Relative: 12 %
Neutro Abs: 2.7 K/uL (ref 1.7–7.7)
Neutro Abs: 3.1 K/uL (ref 1.7–7.7)
Neutrophils Relative %: 51 %
Neutrophils Relative %: 54 %
Platelets: 158 K/uL (ref 150–400)
Platelets: 164 K/uL (ref 150–400)
RBC: 3.53 MIL/uL — ABNORMAL LOW (ref 3.87–5.11)
RBC: 3.53 MIL/uL — ABNORMAL LOW (ref 3.87–5.11)
RDW: 13.4 % (ref 11.5–15.5)
RDW: 13.4 % (ref 11.5–15.5)
WBC: 5.1 K/uL (ref 4.0–10.5)
WBC: 5.6 K/uL (ref 4.0–10.5)
nRBC: 0 % (ref 0.0–0.2)
nRBC: 0 % (ref 0.0–0.2)

## 2023-12-14 LAB — BASIC METABOLIC PANEL WITH GFR
Anion gap: 7 (ref 5–15)
BUN: 12 mg/dL (ref 8–23)
CO2: 26 mmol/L (ref 22–32)
Calcium: 8.7 mg/dL — ABNORMAL LOW (ref 8.9–10.3)
Chloride: 107 mmol/L (ref 98–111)
Creatinine, Ser: 0.8 mg/dL (ref 0.44–1.00)
GFR, Estimated: >60 mL/min (ref >60)
Glucose, Bld: 129 mg/dL — ABNORMAL HIGH (ref 70–99)
Potassium: 3.3 mmol/L — ABNORMAL LOW (ref 3.5–5.1)
Sodium: 140 mmol/L (ref 135–145)

## 2023-12-14 MED ORDER — HYDROMORPHONE HCL 1 MG/ML IJ SOLN
1.0000 mg | Freq: Once | INTRAMUSCULAR | Status: AC
  Administered 2023-12-14: 1 mg via INTRAVENOUS
  Filled 2023-12-14: qty 1

## 2023-12-14 MED ORDER — LORAZEPAM 2 MG/ML IJ SOLN: 1.0000 mg | Freq: Once | INTRAMUSCULAR | Status: DC

## 2023-12-14 MED ORDER — HYDROMORPHONE HCL 1 MG/ML IJ SOLN
1.0000 mg | Freq: Once | INTRAMUSCULAR | Status: AC
  Administered 2023-12-14: 1 mg via INTRAVENOUS

## 2023-12-14 MED ORDER — TRAMADOL HCL 50 MG PO TABS: 50.0000 mg | ORAL_TABLET | Freq: Four times a day (QID) | ORAL | 0 refills | Status: DC | PRN

## 2023-12-14 MED ORDER — METHYLPREDNISOLONE SODIUM SUCC 125 MG IJ SOLR
125.0000 mg | Freq: Once | INTRAMUSCULAR | Status: AC
  Administered 2023-12-14: 125 mg via INTRAVENOUS
  Filled 2023-12-14: qty 2

## 2023-12-14 MED ORDER — ONDANSETRON HCL 4 MG/2ML IJ SOLN
4.0000 mg | Freq: Once | INTRAMUSCULAR | Status: AC
  Administered 2023-12-14: 4 mg via INTRAVENOUS

## 2023-12-14 MED ORDER — KETOROLAC TROMETHAMINE 30 MG/ML IJ SOLN
30.0000 mg | Freq: Once | INTRAMUSCULAR | Status: AC
  Administered 2023-12-14: 30 mg via INTRAVENOUS
  Filled 2023-12-14: qty 1

## 2023-12-14 MED ORDER — OXYCODONE-ACETAMINOPHEN 5-325 MG PO TABS: 1.0000 | ORAL_TABLET | Freq: Four times a day (QID) | ORAL | 0 refills | Status: DC | PRN

## 2023-12-14 MED ORDER — ONDANSETRON HCL 4 MG/2ML IJ SOLN
4.0000 mg | Freq: Once | INTRAMUSCULAR | Status: AC
  Administered 2023-12-14: 4 mg via INTRAVENOUS
  Filled 2023-12-14: qty 2

## 2023-12-14 MED ORDER — PREDNISONE 20 MG PO TABS: ORAL_TABLET | ORAL | 0 refills | Status: DC

## 2023-12-14 MED ORDER — TRAMADOL HCL 50 MG PO TABS
50.0000 mg | ORAL_TABLET | Freq: Once | ORAL | Status: AC
  Administered 2023-12-14: 50 mg via ORAL
  Filled 2023-12-14: qty 1

## 2023-12-14 NOTE — ED Provider Notes (Signed)
 Duplicate, created in error when note transferred from other chart.   Toma Alissa Pharr, MD 12/14/23 1500    Darra Fonda MATSU, MD 12/15/23 [phone]

## 2023-12-14 NOTE — Telephone Encounter (Signed)
 Called patient and left a message for a call back.

## 2023-12-14 NOTE — ED Triage Notes (Signed)
 PT was prescribed tramadol  about 4 weeks ago for the pain.  PER pt the tramadol  has been helping a little bit.  PT tearful at this time.

## 2023-12-14 NOTE — Telephone Encounter (Signed)
 I'm sorry to hear this.  Since she has acute worsening of pain, it may be best for her to be evaluated in urgent care or ER to see what's causing her severe pain.

## 2023-12-14 NOTE — ED Provider Notes (Signed)
 [facility] AT [facility] Provider Note   CSN: [id number] Arrival date & time: 12/14/23  1256     Patient presents with: No chief complaint on file.   Kelly Perkins is a 73 y.o. female.   This morning, patient woke up feeling severe pain in her lower back and traveling down her right leg.  This pain was intractable. Pain is radiating down her leg feels electric in nature.  She also reports numbness in her right toes. Patient was able to ambulate, but had to hold on to the wall. She called EMS and they brought her to the ED.   Patient reports recent Xray around August with Ortho at an unclear location showing vertebrae out of place. 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 Patient states she has been taking tramadol  every 6 hours (unknown dose) and Flexeril  5 or 10 mg twice daily. Interview is limited by patient's pain.      Prior to Admission medications   Medication Sig Start Date End Date Taking? Authorizing Provider  albuterol  (VENTOLIN  HFA) 108 (90 Base) MCG/ACT inhaler Inhale 2 puffs into the lungs every 6 (six) hours as needed for wheezing or shortness of breath. 11/14/23   Amon Aloysius FORBES, MD  amLODipine  (NORVASC ) 5 MG tablet TAKE 1 TABLET (5 MG TOTAL) BY MOUTH DAILY. 11/19/23   Webb, Padonda B, FNP  benzonatate  (TESSALON ) 200 MG capsule Take 1 capsule (200 mg total) by mouth 2 (two) times daily as needed for cough. 11/20/23   Almarie Waddell NOVAK, NP  budesonide -formoterol  (SYMBICORT ) 160-4.5 MCG/ACT inhaler Inhale 2 puffs into the lungs 2 (two) times daily. 11/14/23   Amon Aloysius FORBES, MD  celecoxib  (CELEBREX ) 200 MG capsule Take 1 capsule (200 mg total) by mouth daily as needed. 10/23/23   Amon Aloysius FORBES, MD  cyclobenzaprine  (FLEXERIL ) 10 MG tablet Take 1 tablet (10 mg total) by mouth at bedtime as needed for muscle spasms. 09/04/23   Amon Aloysius FORBES, MD  dicyclomine  (BENTYL ) 10 MG capsule Take 1 capsule (10 mg  total) by mouth 4 (four) times daily -  before meals and at bedtime. 10/08/21   Paz, Jose E, MD  hydrocortisone  (ANUSOL -HC) 2.5 % rectal cream Place 1 Application rectally 2 (two) times daily. 11/20/23   Almarie Waddell NOVAK, NP  levothyroxine  (SYNTHROID ) 137 MCG tablet TAKE 1 TABLET BY MOUTH DAILY BEFORE BREAKFAST. 06/01/23   Paz, Jose E, MD  lisinopril -hydrochlorothiazide  (ZESTORETIC ) 10-12.5 MG tablet Take 1 tablet by mouth daily. 09/18/23   Amon Aloysius FORBES, MD  pantoprazole  (PROTONIX ) 40 MG tablet Take 1 tablet (40 mg total) by mouth daily. 09/18/23   Amon Aloysius FORBES, MD  pregabalin  (LYRICA ) 75 MG capsule Take 1 capsule (75 mg total) by mouth 2 (two) times daily. 11/01/23 12/31/23  Georgina Ozell LABOR, MD  simvastatin  (ZOCOR ) 40 MG tablet Take 1 tablet (40 mg total) by mouth at bedtime. 09/18/23   Amon Aloysius FORBES, MD  zolpidem  (AMBIEN ) 10 MG tablet TAKE 1 TABLET BY MOUTH AT BEDTIME AS NEEDED FOR SLEEP 12/12/23   Paz, Jose E, MD    Allergies: Gabapentin  and Hydrocodone  bitartrate er    Review of Systems  Constitutional:  Negative for chills and fever.  HENT:  Negative for ear pain and sore throat.   Eyes:  Negative for pain and visual disturbance.  Respiratory:  Negative for cough and shortness of breath.   Cardiovascular:  Negative for chest  pain and palpitations.  Gastrointestinal:  Negative for abdominal pain and vomiting.  Genitourinary:  Negative for dysuria and hematuria.  Musculoskeletal:  Negative for arthralgias and back pain.  Skin:  Negative for color change and rash.  Neurological:  Negative for seizures and syncope.  All other systems reviewed and are negative.   Updated Vital Signs There were no vitals taken for this visit.  Physical Exam Vitals and nursing note reviewed.  Constitutional:      General: She is in acute distress (gripping tightly onto bed railing, crying).     Appearance: She is well-developed. She is obese.  HENT:     Head: Normocephalic and atraumatic.  Eyes:      Conjunctiva/sclera: Conjunctivae normal.  Cardiovascular:     Rate and Rhythm: Normal rate and regular rhythm.     Heart sounds: No murmur heard. Pulmonary:     Effort: Pulmonary effort is normal. No respiratory distress.     Breath sounds: Normal breath sounds.  Abdominal:     Palpations: Abdomen is soft.     Tenderness: There is no abdominal tenderness.  Musculoskeletal:        General: No swelling.     Cervical back: Neck supple.     Right hip: Tenderness (tenderness over greater trochanter, SI joint) present. No deformity. Decreased range of motion (internally rotated, able to rotate externally but severely limited by pain; unable to raise R leg due to painf). Decreased strength.     Left hip: Normal. No deformity. Normal strength.     Right foot: Normal range of motion and normal capillary refill. No Charcot foot, foot drop or tenderness. Normal pulse.     Left foot: Normal.  Skin:    General: Skin is warm and dry.     Capillary Refill: Capillary refill takes 2 to 3 seconds.  Neurological:     General: No focal deficit present.     Mental Status: She is alert and oriented to person, place, and time.  Psychiatric:        Mood and Affect: Mood normal.     (all labs ordered are listed, but only abnormal results are displayed) Labs Reviewed  BASIC METABOLIC PANEL WITH GFR  CBC WITH DIFFERENTIAL/PLATELET    EKG: None  Radiology: No results found.   Procedures   Medications Ordered in the ED  HYDROmorphone  (DILAUDID ) injection 1 mg (has no administration in time range)  ondansetron  (ZOFRAN ) injection 4 mg (has no administration in time range)                                   Medical Decision Making [name] is a 73 year old female with pertinent PMH of HTN, seizures, CVA, reportedly following with Ortho for possible spinal fracture and presenting with severe lumbar and right lower extremity pain.  Patient was in severe pain which was initially treated with  Dilaudid .  Given patient's persistent internal rotation of hip with poor mobility limited by pain, pursued radiography of right hip to evaluate for dislocation or fracture which showed R hip osteoarthritis but no acute pathology. Obtained CT imaging of lumbar spine, which was pending at time of my signout. On reevaluation prior to signout, patient's pain was improving and a dose of tramadol  was ordered.  Amount and/or Complexity of Data Reviewed Labs: ordered. Decision-making details documented in ED Course. Radiology: ordered and independent interpretation performed. Decision-making details documented in ED Course.  Risk Prescription drug management.      Final diagnoses:  None    ED Discharge Orders     None        Jonte Wollam, MD 12/14/23 1538    Long, Fonda MATSU, MD 12/15/23 [phone]

## 2023-12-14 NOTE — ED Triage Notes (Signed)
 PT to etc via ems with co increased back pain when pt woke up this morning.  PT does have a history of a slipped disk per ems. PT denies falling at this time.

## 2023-12-14 NOTE — Telephone Encounter (Signed)
 Called patient and she stated that her right thigh is in so much pain. She states her toes are numb on both feet. Pain started getting bad last night. Patient states it feels like a vertebrae is out of place in her back.   Dr. Trudy referred patient to pain management.Patient states no redness, no warmth to leg, no swelling. Patient is crying due to her pain. Patient aware that I will send this message to Dr. Tobie and contact her as soon as possible.

## 2023-12-14 NOTE — ED Provider Notes (Signed)
  Physical Exam  BP (!) 151/79   Pulse 90   Temp 97.9 F (36.6 C) (Oral)   Resp 16   SpO2 96%   Physical Exam  Procedures  Procedures  ED Course / MDM    Medical Decision Making Care assumed at 3 PM.  Patient is here with worsening back pain.  Patient has MRI scheduled next week.  Patient is here with worsening pain radiated down to the right leg.  Patient was thought to have sciatica and signed out pending CT of the lumbar  4:18 PM Reviewed patient's CT scan and it showed degenerative changes to L4-5 with some bilateral foraminal stenosis.  Patient's pain is under control.  Patient will be prescribed pain medicine and steroids.  Told her to have her MRI appointment on Tuesday.  Gave strict return precaution  Problems Addressed: Sciatica of right side: acute illness or injury  Amount and/or Complexity of Data Reviewed Labs: ordered. Radiology: ordered.  Risk Prescription drug management.          Patt Alm Macho, MD 12/14/23 [phone]

## 2023-12-14 NOTE — Discharge Instructions (Addendum)
 We suspect your pain today was due to sciatica, which is a pinching of the sciatic nerve at the level of your lumbar spine.  We know you have some spine wear and tear in your lower back (lumbar spine), which could cause these symptoms.  We are sending Percocet for you to help treat your pain.  Please follow up for your MRI and see your PCP in the next week to discuss longer term treatments.  I have prescribed prednisone  for inflammation  Return to ER if you have severe back pain or trouble walking or numbness or weakness

## 2023-12-15 NOTE — Telephone Encounter (Signed)
 Called patient and left a message for a call back.   Patient was seen in [facility] ER 12/14/23.

## 2023-12-19 DIAGNOSIS — M545 Low back pain, unspecified: Secondary | ICD-10-CM | POA: Diagnosis not present

## 2023-12-23 ENCOUNTER — Other Ambulatory Visit: Payer: Self-pay | Admitting: Internal Medicine

## 2023-12-23 DIAGNOSIS — K219 Gastro-esophageal reflux disease without esophagitis: Secondary | ICD-10-CM

## 2023-12-25 LAB — BASIC METABOLIC PANEL WITH GFR
Anion gap: 8 (ref 5–15)
BUN: 13 mg/dL (ref 8–23)
CO2: 24 mmol/L (ref 22–32)
Calcium: 9 mg/dL (ref 8.9–10.3)
Chloride: 107 mmol/L (ref 98–111)
Creatinine, Ser: 0.86 mg/dL (ref 0.44–1.00)
GFR, Estimated: >60 mL/min (ref >60)
Glucose, Bld: 148 mg/dL — ABNORMAL HIGH (ref 70–99)
Potassium: 3.5 mmol/L (ref 3.5–5.1)
Sodium: 139 mmol/L (ref 135–145)

## 2023-12-26 DIAGNOSIS — M4316 Spondylolisthesis, lumbar region: Secondary | ICD-10-CM | POA: Diagnosis not present

## 2023-12-26 DIAGNOSIS — M5416 Radiculopathy, lumbar region: Secondary | ICD-10-CM | POA: Diagnosis not present

## 2023-12-27 ENCOUNTER — Ambulatory Visit: Admitting: Internal Medicine

## 2023-12-27 ENCOUNTER — Encounter: Payer: Self-pay | Admitting: Internal Medicine

## 2023-12-27 VITALS — BP 136/94 | HR 89 | Temp 98.2°F | Resp 20 | Ht 63.0 in | Wt 265.0 lb

## 2023-12-27 DIAGNOSIS — R739 Hyperglycemia, unspecified: Secondary | ICD-10-CM

## 2023-12-27 DIAGNOSIS — E039 Hypothyroidism, unspecified: Secondary | ICD-10-CM

## 2023-12-27 DIAGNOSIS — Z23 Encounter for immunization: Secondary | ICD-10-CM

## 2023-12-27 DIAGNOSIS — M5416 Radiculopathy, lumbar region: Secondary | ICD-10-CM | POA: Diagnosis not present

## 2023-12-27 DIAGNOSIS — I10 Essential (primary) hypertension: Secondary | ICD-10-CM

## 2023-12-27 LAB — BASIC METABOLIC PANEL WITH GFR
BUN: 15 mg/dL (ref 6–23)
CO2: 24 meq/L (ref 19–32)
Calcium: 9.6 mg/dL (ref 8.4–10.5)
Chloride: 101 meq/L (ref 96–112)
Creatinine, Ser: 0.9 mg/dL (ref 0.40–1.20)
GFR: 62.72 mL/min
Glucose, Bld: 216 mg/dL — ABNORMAL HIGH (ref 70–99)
Potassium: 4.3 meq/L (ref 3.5–5.1)
Sodium: 139 meq/L (ref 135–145)

## 2023-12-27 LAB — HEMOGLOBIN A1C: Hgb A1c MFr Bld: 7 % — ABNORMAL HIGH (ref 4.6–6.5)

## 2023-12-27 LAB — TSH: TSH: 1.21 u[IU]/mL (ref 0.35–5.50)

## 2023-12-27 MED ORDER — OXYCODONE-ACETAMINOPHEN 5-325 MG PO TABS: 1.0000 | ORAL_TABLET | Freq: Four times a day (QID) | ORAL | 0 refills | Status: DC | PRN

## 2023-12-27 NOTE — Progress Notes (Unsigned)
 Subjective:    Patient ID: Kelly Perkins, female    DOB: June 06, 1950, 73 y.o.   MRN: [id number]  DOS:  12/27/2023 Follow-up  Discussed the use of AI scribe software for clinical note transcription with the patient, who gave verbal consent to proceed.  History of Present Illness Kelly Perkins is a 73 year old female with chronic back pain who presents for a routine follow-up.  Lumbosacral pain with radiculopathy - Persistent back pain radiating to the right leg - Associated numbness in toes and decreased muscle strength in legs - Significant impact on daily activities, including difficulty walking around a grocery store due to exhaustion - No recent falls - Notes from the ER from October 2 reviewed, CT scan showed DJD changes of the spine.   - MRI completed last week - Orthopedic follow-up scheduled for January 05, 2024  Neuropathic pain management - Discontinued oxycodone  due to running out - Discontinued Celebrex    due to hemorrhoid bleeding that is now resolved - Not taking Lyrica  for nerve pain and uncertain of its effectiveness  Respiratory and cardiovascular symptoms - No recent cough, chest pain, or difficulty breathing  Gastrointestinal and hematologic symptoms - No recent bleeding from hemorrhoids   ER visit 12/14/2023 BP Readings from Last 3 Encounters:  12/27/23 (!) 156/92  12/14/23 121/79  12/14/23 (!) 197/100    Review of Systems See above   Past Medical History:  Diagnosis Date   GERD (gastroesophageal reflux disease)    on meds   History of kidney stones    Hyperlipidemia 09/10/2010   on meds   Hypertension    on meds   Hypothyroidism    on meds   Normal cardiac stress test 08/2010   had CP-DOE   Osteoarthritis    generalizd   Wears dentures    top   Wears glasses     Past Surgical History:  Procedure Laterality Date   ABDOMINAL HYSTERECTOMY  2000   B oophoreectomy, d/t DUB   COLONOSCOPY  2017   JP-MAC-5 yr recall-poor prep-normal    CYSTOSCOPY/URETEROSCOPY/HOLMIUM LASER/STENT PLACEMENT Right 11/17/2017   Procedure: CYSTOSCOPY/RIGHT URETEROSCOPY/RIGHT HOLMIUM LASER/STENT PLACEMENT;  Surgeon: Devere Lonni Righter, MD;  Location: [facility];  Service: Urology;  Laterality: Right;   JOINT REPLACEMENT     LITHOTRIPSY  2004   urolthiasis    MASS EXCISION  09/12/2011   Procedure: EXCISION MASS;  Surgeon: Elna Pick, MD;  Location: [facility];  Service: Plastics;  Laterality: Left;  left ring finger   POLYPECTOMY     TA 2004   THYROIDECTOMY Left 2006   TOTAL KNEE ARTHROPLASTY Left 11/17/2016   Procedure: LEFT TOTAL KNEE ARTHROPLASTY;  Surgeon: Jerri Kay HERO, MD;  Location: [facility];  Service: Orthopedics;  Laterality: Left;   TOTAL KNEE ARTHROPLASTY Right 12/04/2017   Procedure: RIGHT TOTAL KNEE ARTHROPLASTY;  Surgeon: Jerri Kay HERO, MD;  Location: [facility];  Service: Orthopedics;  Laterality: Right;    Current Outpatient Medications  Medication Instructions   albuterol  (VENTOLIN  HFA) 108 (90 Base) MCG/ACT inhaler 2 puffs, Inhalation, Every 6 hours PRN   amLODipine  (NORVASC ) 5 mg, Oral, Daily   benzonatate  (TESSALON ) 200 mg, Oral, 2 times daily PRN   budesonide -formoterol  (SYMBICORT ) 160-4.5 MCG/ACT inhaler 2 puffs, Inhalation, 2 times daily   celecoxib  (CELEBREX ) 200 mg, Oral, Daily PRN   cyclobenzaprine  (FLEXERIL ) 10 mg, Oral, At bedtime PRN   dicyclomine  (BENTYL ) 10 mg, Oral, 3 times daily before meals & bedtime  hydrocortisone  (ANUSOL -HC) 2.5 % rectal cream 1 Application, Rectal, 2 times daily   levothyroxine  (SYNTHROID ) 137 mcg, Oral, Daily before breakfast   lisinopril -hydrochlorothiazide  (ZESTORETIC ) 10-12.5 MG tablet 1 tablet, Oral, Daily   oxyCODONE -acetaminophen  (PERCOCET) 5-325 MG tablet 1 tablet, Oral, Every 6 hours PRN   pantoprazole  (PROTONIX ) 40 mg, Oral, Daily   predniSONE  (DELTASONE ) 20 MG tablet Take 60 mg daily x 2 days then 40 mg daily x 2 days then 20 mg daily x 2 days   pregabalin   (LYRICA ) 75 mg, Oral, 2 times daily   simvastatin  (ZOCOR ) 40 mg, Oral, Daily at bedtime   zolpidem  (AMBIEN ) 10 mg, Oral, At bedtime PRN, for sleep       Objective:   Physical Exam BP (!) 156/92   Pulse 89   Temp 98.2 F (36.8 C) (Oral)   Resp 20   Ht 5' 3 (1.6 m)   Wt 265 lb (120.2 kg)   BMI 46.94 kg/m  General:   Well developed, NAD, BMI noted. HEENT:  Normocephalic . Face symmetric, atraumatic Lungs:  CTA B Normal respiratory effort, no intercostal retractions, no accessory muscle use. Heart: RRR,  no murmur.  Lower extremities: no pretibial edema bilaterally  Skin: Not pale. Not jaundice Neurologic:  alert & oriented X3.  Speech normal, gait and posture are antalgic. Motor lower extremities: Symmetric. Psych--  Cognition and judgment appear intact.  Cooperative with normal attention span and concentration.  Behavior appropriate. No anxious or depressed appearing.      Assessment   ASSESSMENT HTN Hyperlipidemia Insomnia-- ambien  Hypothyroidism, s/p thyroidectomy Morbid obesity GERD Mild anemia, normal iron 12-2013, cscope 08-2015 DJD  h/o kidney stones  CP-DOE: Normal stress test 08/2010, Normal stress test 06/07/2022  Reactive Airway Dz (RAD)   Assessment & Plan Back pain with R radiculopathy. - Chronic back pain with right leg radiculopathy, numbness, and decreased leg strength as reported by the patient however on physical exam his strength is symmetric. - Went to the ER, records reviewed CT scan showed DJD changes. - MRI completed, orthopedic follow-up scheduled. - Resume daily Celebrex , was held due to hemorrhoid bleed which is now resolved.  Stop if bleeding surface. - Although not very effective I will refill oxycodone , PDMP was okay, okay to take it for nighttime pain.  Although Tylenol  has not been very effective recommend to take it as needed - Consider Tylenol  for additional pain relief. HTN: Blood pressure slightly elevated in office, likely  due to rushing and also due to pain. Home readings normal.  For now we will continue amlodipine  and lisinopril  HCT and monitor BPs at home.  Check BMP, potassium at the ER was slightly low. Hypothyroidism: Continue levothyroxine . Check TSH levels. Hyperglycemia: Check A1c Reactive airway disease:Continue Symbicort . Morbid obesity: BMI 46, In the past weight loss medicine were very expensive, currently unable to exercise, reassess on RTC. General Health Maintenance Flu vaccination today. Consider COVID vaccination. RTC 3 to 4 months CPX

## 2023-12-27 NOTE — Patient Instructions (Signed)
 GO TO THE LAB :  Get the blood work    Then, go to the front desk for the checkout Please make an appointment for physical exam in 3 to 4 months   Check the  blood pressure regularly Blood pressure goal:  between 110/65 and  135/85. If it is consistently higher or lower, let me know  Okay to start taking Celebrex  for pain If the pain is severe you can take oxycodone  at bedtime, I sent a prescription You got a flu shot today Recommend a COVID-vaccine at the pharmacy at your convenience  Please read more detailed instructions below       CHRONIC BACK PAIN WITH RIGHT LEG RADICULOPATHY: You have persistent back pain that radiates to your right leg, causing numbness and decreased muscle strength. -Prescribe limited oxycodone  for nighttime pain. -Resume daily Celebrex , monitor for bleeding. -Consider Tylenol  for additional pain relief.  HYPERTENSION: Your blood pressure was slightly elevated in the office, but your home readings are normal. -Continue taking amlodipine  and lisinopril -hydrochlorothiazide . -Monitor your blood pressure at home. -Recheck potassium levels.  HYPOTHYROIDISM: Your thyroid  condition is stable with your current medication. -Continue taking levothyroxine . -Check TSH levels.  REACTIVE AIRWAY DISEASE: Your respiratory condition is stable with your current medication. -Continue using Symbicort .  GENERAL HEALTH MAINTENANCE: We discussed your vaccinations. -Consider getting the COVID vaccination.

## 2023-12-28 ENCOUNTER — Ambulatory Visit: Payer: Self-pay | Admitting: Internal Medicine

## 2023-12-28 DIAGNOSIS — E1165 Type 2 diabetes mellitus with hyperglycemia: Secondary | ICD-10-CM | POA: Insufficient documentation

## 2023-12-28 MED ORDER — METFORMIN HCL ER 500 MG PO TB24: ORAL_TABLET | ORAL | 1 refills | Status: AC

## 2023-12-28 NOTE — Assessment & Plan Note (Signed)
 Back pain with R radiculopathy. - Chronic back pain with right leg radiculopathy, numbness, and decreased leg strength as reported by the patient however on physical exam her strength is symmetric. - Went to the ER, records reviewed CT scan showed DJD changes. - MRI completed, orthopedic follow-up scheduled. - Resume daily Celebrex , was held due to hemorrhoid bleed which is now resolved.  Stop if bleeding surface. - Although not very effective I will refill oxycodone , PDMP was okay,  to take it for nighttime pain.  Although Tylenol  has not been very effective recommend to take it as needed HTN: Blood pressure slightly elevated in office, likely due to rushing and also due to pain. Home readings normal.  For now we will continue amlodipine  and lisinopril  HCT and monitor BPs at home.  Check BMP, potassium at the ER was slightly low. Hypothyroidism: Continue levothyroxine . Check TSH levels. Hyperglycemia: Check A1c Reactive airway disease:Continue Symbicort . Morbid obesity: BMI 46, In the past weight loss medicine were very expensive, currently unable to exercise, reassess on RTC. General Health Maintenance Flu vaccination today. Consider COVID vaccination. RTC 3 to 4 months CPX

## 2023-12-30 ENCOUNTER — Other Ambulatory Visit: Payer: Self-pay | Admitting: Internal Medicine

## 2024-01-05 DIAGNOSIS — M5416 Radiculopathy, lumbar region: Secondary | ICD-10-CM | POA: Diagnosis not present

## 2024-01-10 ENCOUNTER — Other Ambulatory Visit: Payer: Self-pay | Admitting: Orthopedic Surgery

## 2024-01-15 ENCOUNTER — Encounter: Payer: Self-pay | Admitting: Radiology

## 2024-01-20 ENCOUNTER — Other Ambulatory Visit (HOSPITAL_COMMUNITY): Payer: Self-pay

## 2024-02-06 ENCOUNTER — Other Ambulatory Visit: Payer: Self-pay | Admitting: Internal Medicine

## 2024-02-06 ENCOUNTER — Encounter (HOSPITAL_COMMUNITY): Payer: Self-pay

## 2024-02-06 NOTE — Pre-Procedure Instructions (Signed)
 Surgical Instructions   Your procedure is scheduled on Wednesday, December 3rd.  Report to [facility] Main Entrance A at 0900 A.M., then check in with the Admitting office. Any questions or running late day of surgery: call [phone]  Questions prior to your surgery date: call [phone], Monday-Friday, 8am-4pm. If you experience any cold or flu symptoms such as cough, fever, chills, shortness of breath, etc. between now and your scheduled surgery, please notify us  at the above number.     Remember:  Do not eat after midnight the night before your surgery  You may drink clear liquids until 0900 the morning of your surgery.   Clear liquids allowed are: Water, Non-Citrus Juices (without pulp), Carbonated Beverages, Clear Tea (no milk, honey, etc.), Black Coffee Only (NO MILK, CREAM OR POWDERED CREAMER of any kind), and Gatorade.   The day of surgery (if you have diabetes): Drink ONE (1) 12 oz G2 given to you in your pre admission testing appointment by 0900 the morning of surgery. Drink in one sitting. Do not sip.  This drink was given to you during your hospital  pre-op appointment visit.  Nothing else to drink after completing the  12 oz bottle of G2.         If you have questions, please contact your surgeon's office.     Take these medicines the morning of surgery with A SIP OF WATER: budesonide -formoterol  (SYMBICORT ) inhaler  levothyroxine  (SYNTHROID )  pantoprazole  (PROTONIX )   May take these medicines IF NEEDED: albuterol  (VENTOLIN  HFA) inhaler - please bring it with you traMADol  (ULTRAM )   One week prior to surgery, STOP taking any Aspirin  (unless otherwise instructed by your surgeon) Aleve, Naproxen, Ibuprofen, Motrin, Advil, Goody's, BC's, all herbal medications, fish oil, and non-prescription vitamins. This includes celecoxib  (CELEBREX .)    WHAT DO I DO ABOUT MY DIABETES MEDICATION?   Do not take oral diabetes medicines (pills) the morning of surgery. Last  dose of metFORMIN  (GLUCOPHAGE -XR) is on December 2nd.    HOW TO MANAGE YOUR DIABETES BEFORE AND AFTER SURGERY  Why is it important to control my blood sugar before and after surgery? Improving blood sugar levels before and after surgery helps healing and can limit problems. A way of improving blood sugar control is eating a healthy diet by:  Eating less sugar and carbohydrates  Increasing activity/exercise  Talking with your doctor about reaching your blood sugar goals High blood sugars (greater than 180 mg/dL) can raise your risk of infections and slow your recovery, so you will need to focus on controlling your diabetes during the weeks before surgery. Make sure that the doctor who takes care of your diabetes knows about your planned surgery including the date and location.  How do I manage my blood sugar before surgery? Check your blood sugar at least 4 times a day, starting 2 days before surgery, to make sure that the level is not too high or low.  Check your blood sugar the morning of your surgery when you wake up and every 2 hours until you get to the Short Stay unit.  If your blood sugar is less than 70 mg/dL, you will need to treat for low blood sugar: Do not take insulin . Treat a low blood sugar (less than 70 mg/dL) with  cup of clear juice (cranberry or apple), 4 glucose tablets, OR glucose gel. Recheck blood sugar in 15 minutes after treatment (to make sure it is greater than 70 mg/dL). If your blood sugar is not  greater than 70 mg/dL on recheck, call [phone] for further instructions. Report your blood sugar to the short stay nurse when you get to Short Stay.  If you are admitted to the hospital after surgery: Your blood sugar will be checked by the staff and you will probably be given insulin  after surgery (instead of oral diabetes medicines) to make sure you have good blood sugar levels. The goal for blood sugar control after surgery is 80-180 mg/dL.                     Do NOT Smoke (Tobacco/Vaping) for 24 hours prior to your procedure.  If you use a CPAP at night, you may bring your mask/headgear for your overnight stay.   You will be asked to remove any contacts, glasses, piercing's, hearing aid's, dentures/partials prior to surgery. Please bring cases for these items if needed.    Patients discharged the day of surgery will not be allowed to drive home, and someone needs to stay with them for 24 hours.  SURGICAL WAITING ROOM VISITATION Patients may have no more than 2 support people in the waiting area - these visitors may rotate.   Pre-op nurse will coordinate an appropriate time for 1 ADULT support person, who may not rotate, to accompany patient in pre-op.  Children under the age of 59 must have an adult with them who is not the patient and must remain in the main waiting area with an adult.  If the patient needs to stay at the hospital during part of their recovery, the visitor guidelines for inpatient rooms apply.  Please refer to the [facility] website for the visitor guidelines for any additional information.   If you received a COVID test during your pre-op visit  it is requested that you wear a mask when out in public, stay away from anyone that may not be feeling well and notify your surgeon if you develop symptoms. If you have been in contact with anyone that has tested positive in the last 10 days please notify you surgeon.      Pre-operative 4 CHG Bathing Instructions   You can play a key role in reducing the risk of infection after surgery. Your skin needs to be as free of germs as possible. You can reduce the number of germs on your skin by washing with CHG (chlorhexidine  gluconate) soap before surgery. CHG is an antiseptic soap that kills germs and continues to kill germs even after washing.   DO NOT use if you have an allergy to chlorhexidine /CHG or antibacterial soaps. If your skin becomes reddened or irritated, stop using the CHG  and notify one of our RNs at [phone].   Please shower with the CHG soap starting 4 days before surgery using the following schedule:     Please keep in mind the following:  DO NOT shave, including legs and underarms, starting the day of your first shower.   You may shave your face at any point before/day of surgery.  Place clean sheets on your bed the day you start using CHG soap. Use a clean washcloth (not used since being washed) for each shower. DO NOT sleep with pets once you start using the CHG.   CHG Shower Instructions:  Wash your face and private area with normal soap. If you choose to wash your hair, wash first with your normal shampoo.  After you use shampoo/soap, rinse your hair and body thoroughly to remove shampoo/soap residue.  Turn  the water OFF and apply  bottle of CHG soap to a CLEAN washcloth.  Apply CHG soap ONLY FROM YOUR NECK DOWN TO YOUR TOES (washing for 3-5 minutes)  DO NOT use CHG soap on face, private areas, open wounds, or sores.  Pay special attention to the area where your surgery is being performed.  If you are having back surgery, having someone wash your back for you may be helpful. Wait 2 minutes after CHG soap is applied, then you may rinse off the CHG soap.  Pat dry with a clean towel  Put on clean clothes/pajamas   If you choose to wear lotion, please use ONLY the CHG-compatible lotions that are listed below.  Additional instructions for the day of surgery:  If you choose, you may shower the morning of surgery with an antibacterial soap.  DO NOT APPLY any lotions, deodorants, cologne, or perfumes.   Do not bring valuables to the hospital. [facility] is not responsible for any belongings/valuables. Do not wear nail polish, gel polish, artificial nails, or any other type of covering on natural nails (fingers and toes) Do not wear jewelry or makeup Put on clean/comfortable clothes.  Please brush your teeth.  Ask your nurse before applying any  prescription medications to the skin.     CHG Compatible Lotions   Aveeno Moisturizing lotion  Cetaphil Moisturizing Cream  Cetaphil Moisturizing Lotion  Clairol Herbal Essence Moisturizing Lotion, Dry Skin  Clairol Herbal Essence Moisturizing Lotion, Extra Dry Skin  Clairol Herbal Essence Moisturizing Lotion, Normal Skin  Curel Age Defying Therapeutic Moisturizing Lotion with Alpha Hydroxy  Curel Extreme Care Body Lotion  Curel Soothing Hands Moisturizing Hand Lotion  Curel Therapeutic Moisturizing Cream, Fragrance-Free  Curel Therapeutic Moisturizing Lotion, Fragrance-Free  Curel Therapeutic Moisturizing Lotion, Original Formula  Eucerin Daily Replenishing Lotion  Eucerin Dry Skin Therapy Plus Alpha Hydroxy Crme  Eucerin Dry Skin Therapy Plus Alpha Hydroxy Lotion  Eucerin Original Crme  Eucerin Original Lotion  Eucerin Plus Crme Eucerin Plus Lotion  Eucerin TriLipid Replenishing Lotion  Keri Anti-Bacterial Hand Lotion  Keri Deep Conditioning Original Lotion Dry Skin Formula Softly Scented  Keri Deep Conditioning Original Lotion, Fragrance Free Sensitive Skin Formula  Keri Lotion Fast Absorbing Fragrance Free Sensitive Skin Formula  Keri Lotion Fast Absorbing Softly Scented Dry Skin Formula  Keri Original Lotion  Keri Skin Renewal Lotion Keri Silky Smooth Lotion  Keri Silky Smooth Sensitive Skin Lotion  Nivea Body Creamy Conditioning Oil  Nivea Body Extra Enriched Lotion  Nivea Body Original Lotion  Nivea Body Sheer Moisturizing Lotion Nivea Crme  Nivea Skin Firming Lotion  NutraDerm 30 Skin Lotion  NutraDerm Skin Lotion  NutraDerm Therapeutic Skin Cream  NutraDerm Therapeutic Skin Lotion  ProShield Protective Hand Cream  Provon moisturizing lotion  Please read over the following fact sheets that you were given.

## 2024-02-07 ENCOUNTER — Encounter (HOSPITAL_COMMUNITY)
Admission: RE | Admit: 2024-02-07 | Discharge: 2024-02-07 | Disposition: A | Discharge status: Home / Self Care | Source: Ambulatory Visit | Attending: Orthopedic Surgery | Admitting: Orthopedic Surgery

## 2024-02-07 ENCOUNTER — Encounter (HOSPITAL_COMMUNITY): Payer: Self-pay

## 2024-02-07 ENCOUNTER — Other Ambulatory Visit: Payer: Self-pay

## 2024-02-07 VITALS — BP 97/57 | HR 79 | Temp 97.9°F | Resp 18 | Ht 63.0 in | Wt 254.0 lb

## 2024-02-07 DIAGNOSIS — Z01818 Encounter for other preprocedural examination: Secondary | ICD-10-CM | POA: Diagnosis not present

## 2024-02-07 DIAGNOSIS — E119 Type 2 diabetes mellitus without complications: Secondary | ICD-10-CM | POA: Diagnosis not present

## 2024-02-07 DIAGNOSIS — I10 Essential (primary) hypertension: Secondary | ICD-10-CM | POA: Insufficient documentation

## 2024-02-07 HISTORY — DX: Type 2 diabetes mellitus without complications: E11.9

## 2024-02-07 LAB — CBC
HCT: 36.3 % (ref 36.0–46.0)
Hemoglobin: 12 g/dL (ref 12.0–15.0)
MCH: 31 pg (ref 26.0–34.0)
MCHC: 33.1 g/dL (ref 30.0–36.0)
MCV: 93.8 fL (ref 80.0–100.0)
Platelets: 191 K/uL (ref 150–400)
RBC: 3.87 MIL/uL (ref 3.87–5.11)
RDW: 12.8 % (ref 11.5–15.5)
WBC: 6.4 K/uL (ref 4.0–10.5)
nRBC: 0 % (ref 0.0–0.2)

## 2024-02-07 LAB — BASIC METABOLIC PANEL WITH GFR
Anion gap: 14 (ref 5–15)
BUN: 19 mg/dL (ref 8–23)
CO2: 25 mmol/L (ref 22–32)
Calcium: 9.7 mg/dL (ref 8.9–10.3)
Chloride: 101 mmol/L (ref 98–111)
Creatinine, Ser: 0.94 mg/dL (ref 0.44–1.00)
GFR, Estimated: >60 mL/min (ref >60)
Glucose, Bld: 118 mg/dL — ABNORMAL HIGH (ref 70–99)
Potassium: 4.1 mmol/L (ref 3.5–5.1)
Sodium: 140 mmol/L (ref 135–145)

## 2024-02-07 LAB — TYPE AND SCREEN
ABO/RH(D): O POS
Antibody Screen: NEGATIVE

## 2024-02-07 LAB — SURGICAL PCR SCREEN
MRSA, PCR: NEGATIVE
Staphylococcus aureus: POSITIVE — AB

## 2024-02-07 LAB — GLUCOSE, CAPILLARY: Glucose-Capillary: 141 mg/dL — ABNORMAL HIGH (ref 70–99)

## 2024-02-07 NOTE — Progress Notes (Signed)
 PCP - Dr. Aloysius Mech Cardiologist - denies  PPM/ICD - denies   Chest x-ray - 07/01/22 EKG - 02/07/24 Stress Test - 06/07/22 ECHO - 02/25/08 Cardiac Cath - denies  Sleep Study - denies   DM- pt states that she does not check her CBG at home and does not know typical fasting levels  Last dose of GLP1 agonist-  n/a  ASA/Blood Thinner Instructions: n/a   ERAS Protcol - clears until 0900 PRE-SURGERY G2- given  COVID TEST- n/a   Anesthesia review: no  Patient denies shortness of breath, fever, cough and chest pain at PAT appointment   All instructions explained to the patient, with a verbal understanding of the material. Patient agrees to go over the instructions while at home for a better understanding. The opportunity to ask questions was provided.

## 2024-02-14 ENCOUNTER — Ambulatory Visit (HOSPITAL_COMMUNITY): Admission: RE | Disposition: A | Payer: Self-pay | Source: Home / Self Care | Attending: Orthopedic Surgery

## 2024-02-14 ENCOUNTER — Observation Stay (HOSPITAL_COMMUNITY)
Admission: RE | Admit: 2024-02-14 | Discharge: 2024-02-15 | Disposition: A | Discharge status: Home / Self Care | Attending: Orthopedic Surgery | Admitting: Orthopedic Surgery

## 2024-02-14 ENCOUNTER — Encounter (HOSPITAL_COMMUNITY): Payer: Self-pay | Admitting: Orthopedic Surgery

## 2024-02-14 ENCOUNTER — Ambulatory Visit (HOSPITAL_COMMUNITY)

## 2024-02-14 ENCOUNTER — Ambulatory Visit (HOSPITAL_COMMUNITY): Admitting: Anesthesiology

## 2024-02-14 ENCOUNTER — Other Ambulatory Visit: Payer: Self-pay

## 2024-02-14 DIAGNOSIS — E119 Type 2 diabetes mellitus without complications: Secondary | ICD-10-CM

## 2024-02-14 DIAGNOSIS — E039 Hypothyroidism, unspecified: Secondary | ICD-10-CM | POA: Diagnosis not present

## 2024-02-14 DIAGNOSIS — Z7984 Long term (current) use of oral hypoglycemic drugs: Secondary | ICD-10-CM

## 2024-02-14 DIAGNOSIS — Z0189 Encounter for other specified special examinations: Secondary | ICD-10-CM | POA: Diagnosis not present

## 2024-02-14 DIAGNOSIS — M5416 Radiculopathy, lumbar region: Secondary | ICD-10-CM | POA: Diagnosis not present

## 2024-02-14 DIAGNOSIS — M5116 Intervertebral disc disorders with radiculopathy, lumbar region: Secondary | ICD-10-CM | POA: Diagnosis not present

## 2024-02-14 DIAGNOSIS — M5410 Radiculopathy, site unspecified: Principal | ICD-10-CM | POA: Diagnosis present

## 2024-02-14 DIAGNOSIS — M4316 Spondylolisthesis, lumbar region: Secondary | ICD-10-CM | POA: Diagnosis not present

## 2024-02-14 DIAGNOSIS — M48061 Spinal stenosis, lumbar region without neurogenic claudication: Secondary | ICD-10-CM

## 2024-02-14 HISTORY — PX: TRANSFORAMINAL LUMBAR INTERBODY FUSION (TLIF) WITH PEDICLE SCREW FIXATION 1 LEVEL: SHX6141

## 2024-02-14 LAB — GLUCOSE, CAPILLARY
Glucose-Capillary: 122 mg/dL — ABNORMAL HIGH (ref 70–99)
Glucose-Capillary: 101 mg/dL — ABNORMAL HIGH (ref 70–99)
Glucose-Capillary: 143 mg/dL — ABNORMAL HIGH (ref 70–99)
Glucose-Capillary: 157 mg/dL — ABNORMAL HIGH (ref 70–99)

## 2024-02-14 SURGERY — TRANSFORAMINAL LUMBAR INTERBODY FUSION (TLIF) WITH PEDICLE SCREW FIXATION 1 LEVEL
Anesthesia: General | Site: Spine Lumbar | Laterality: Right

## 2024-02-14 MED ORDER — HYDROCHLOROTHIAZIDE 12.5 MG PO TABS
12.5000 mg | ORAL_TABLET | Freq: Every day | ORAL | Status: DC
  Administered 2024-02-14: 12.5 mg via ORAL
  Filled 2024-02-14: qty 1

## 2024-02-14 MED ORDER — BUPIVACAINE-EPINEPHRINE 0.25% -1:200000 IJ SOLN
INTRAMUSCULAR | Status: DC | PRN
  Administered 2024-02-14: 10 mL

## 2024-02-14 MED ORDER — BUPIVACAINE-EPINEPHRINE (PF) 0.25% -1:200000 IJ SOLN
INTRAMUSCULAR | Status: AC
  Filled 2024-02-14: qty 30

## 2024-02-14 MED ORDER — ALBUTEROL SULFATE HFA 108 (90 BASE) MCG/ACT IN AERS
INHALATION_SPRAY | RESPIRATORY_TRACT | Status: DC | PRN
  Administered 2024-02-14: 2 via RESPIRATORY_TRACT

## 2024-02-14 MED ORDER — SUGAMMADEX SODIUM 200 MG/2ML IV SOLN
INTRAVENOUS | Status: DC | PRN
  Administered 2024-02-14 (×2): 200 mg via INTRAVENOUS

## 2024-02-14 MED ORDER — LIDOCAINE 2% (20 MG/ML) 5 ML SYRINGE
INTRAMUSCULAR | Status: AC
  Filled 2024-02-14: qty 5

## 2024-02-14 MED ORDER — MINERAL OIL LIGHT OIL
TOPICAL_OIL | Status: DC | PRN
  Administered 2024-02-14: 1 via TOPICAL

## 2024-02-14 MED ORDER — HYDROMORPHONE HCL 1 MG/ML IJ SOLN
INTRAMUSCULAR | Status: DC | PRN
  Administered 2024-02-14: .5 mg via INTRAVENOUS

## 2024-02-14 MED ORDER — SODIUM CHLORIDE 0.9 % IV SOLN: 250.0000 mL | INTRAVENOUS | Status: DC

## 2024-02-14 MED ORDER — SENNOSIDES-DOCUSATE SODIUM 8.6-50 MG PO TABS: 1.0000 | ORAL_TABLET | Freq: Every evening | ORAL | Status: DC | PRN

## 2024-02-14 MED ORDER — OXYCODONE HCL 5 MG/5ML PO SOLN: 5.0000 mg | Freq: Once | ORAL | Status: AC | PRN

## 2024-02-14 MED ORDER — POTASSIUM CHLORIDE IN NACL 20-0.9 MEQ/L-% IV SOLN
INTRAVENOUS | Status: DC
  Filled 2024-02-14: qty 1000

## 2024-02-14 MED ORDER — ACETAMINOPHEN 325 MG PO TABS: 650.0000 mg | ORAL_TABLET | ORAL | Status: DC | PRN

## 2024-02-14 MED ORDER — ACETAMINOPHEN 650 MG RE SUPP: 650.0000 mg | RECTAL | Status: DC | PRN

## 2024-02-14 MED ORDER — PHENYLEPHRINE 80 MCG/ML (10ML) SYRINGE FOR IV PUSH (FOR BLOOD PRESSURE SUPPORT)
PREFILLED_SYRINGE | INTRAVENOUS | Status: DC | PRN
  Administered 2024-02-14 (×4): 80 ug via INTRAVENOUS

## 2024-02-14 MED ORDER — THROMBIN 20000 UNITS EX KIT
PACK | CUTANEOUS | Status: AC
  Filled 2024-02-14: qty 1

## 2024-02-14 MED ORDER — FLUTICASONE FUROATE-VILANTEROL 100-25 MCG/ACT IN AEPB
1.0000 | INHALATION_SPRAY | Freq: Every day | RESPIRATORY_TRACT | Status: DC
  Filled 2024-02-14: qty 28

## 2024-02-14 MED ORDER — ONDANSETRON HCL 4 MG/2ML IJ SOLN
INTRAMUSCULAR | Status: AC
  Filled 2024-02-14: qty 2

## 2024-02-14 MED ORDER — PHENYLEPHRINE 80 MCG/ML (10ML) SYRINGE FOR IV PUSH (FOR BLOOD PRESSURE SUPPORT): PREFILLED_SYRINGE | INTRAVENOUS | Status: DC | PRN

## 2024-02-14 MED ORDER — SODIUM CHLORIDE 0.9% FLUSH: 3.0000 mL | INTRAVENOUS | Status: DC | PRN

## 2024-02-14 MED ORDER — PROPOFOL 500 MG/50ML IV EMUL
INTRAVENOUS | Status: DC | PRN
  Administered 2024-02-14: 100 ug/kg/min via INTRAVENOUS
  Administered 2024-02-14: 125 ug/kg/min via INTRAVENOUS

## 2024-02-14 MED ORDER — ONDANSETRON HCL 4 MG PO TABS: 4.0000 mg | ORAL_TABLET | Freq: Four times a day (QID) | ORAL | Status: DC | PRN

## 2024-02-14 MED ORDER — INSULIN ASPART 100 UNIT/ML IJ SOLN: 0.0000 [IU] | Freq: Three times a day (TID) | INTRAMUSCULAR | Status: DC

## 2024-02-14 MED ORDER — CEFAZOLIN SODIUM-DEXTROSE 2-4 GM/100ML-% IV SOLN
INTRAVENOUS | Status: AC
  Filled 2024-02-14: qty 100

## 2024-02-14 MED ORDER — CHLORHEXIDINE GLUCONATE 0.12 % MT SOLN: 15.0000 mL | Freq: Once | OROMUCOSAL | Status: AC

## 2024-02-14 MED ORDER — METFORMIN HCL ER 500 MG PO TB24
1000.0000 mg | ORAL_TABLET | Freq: Every day | ORAL | Status: DC
  Administered 2024-02-15: 1000 mg via ORAL
  Filled 2024-02-14: qty 2

## 2024-02-14 MED ORDER — ALBUMIN HUMAN 5 % IV SOLN: INTRAVENOUS | Status: DC | PRN

## 2024-02-14 MED ORDER — PHENOL 1.4 % MT LIQD: 1.0000 | OROMUCOSAL | Status: DC | PRN

## 2024-02-14 MED ORDER — POVIDONE-IODINE 7.5 % EX SOLN
Freq: Once | CUTANEOUS | Status: AC
  Filled 2024-02-14: qty 118

## 2024-02-14 MED ORDER — HYDROMORPHONE HCL 1 MG/ML IJ SOLN
INTRAMUSCULAR | Status: AC
  Filled 2024-02-14: qty 1

## 2024-02-14 MED ORDER — ONDANSETRON HCL 4 MG/2ML IJ SOLN: 4.0000 mg | Freq: Four times a day (QID) | INTRAMUSCULAR | Status: DC | PRN

## 2024-02-14 MED ORDER — DOCUSATE SODIUM 100 MG PO CAPS
100.0000 mg | ORAL_CAPSULE | Freq: Two times a day (BID) | ORAL | Status: DC
  Administered 2024-02-14 – 2024-02-15 (×2): 100 mg via ORAL
  Filled 2024-02-14 (×2): qty 1

## 2024-02-14 MED ORDER — 0.9 % SODIUM CHLORIDE (POUR BTL) OPTIME
TOPICAL | Status: DC | PRN
  Administered 2024-02-14: 1000 mL

## 2024-02-14 MED ORDER — ONDANSETRON HCL 4 MG/2ML IJ SOLN
INTRAMUSCULAR | Status: DC | PRN
  Administered 2024-02-14: 4 mg via INTRAVENOUS

## 2024-02-14 MED ORDER — ZOLPIDEM TARTRATE 5 MG PO TABS: 10.0000 mg | ORAL_TABLET | Freq: Every evening | ORAL | Status: DC | PRN

## 2024-02-14 MED ORDER — INSULIN ASPART 100 UNIT/ML IJ SOLN: 0.0000 [IU] | Freq: Every day | INTRAMUSCULAR | Status: DC

## 2024-02-14 MED ORDER — ROCURONIUM BROMIDE 10 MG/ML (PF) SYRINGE
PREFILLED_SYRINGE | INTRAVENOUS | Status: AC
  Filled 2024-02-14: qty 10

## 2024-02-14 MED ORDER — HYDROMORPHONE HCL 1 MG/ML IJ SOLN
0.5000 mg | INTRAMUSCULAR | Status: DC | PRN
  Administered 2024-02-14 (×2): 0.5 mg via INTRAVENOUS

## 2024-02-14 MED ORDER — DEXMEDETOMIDINE HCL IN NACL 80 MCG/20ML IV SOLN
INTRAVENOUS | Status: DC | PRN
  Administered 2024-02-14: 8 ug via INTRAVENOUS

## 2024-02-14 MED ORDER — OXYCODONE-ACETAMINOPHEN 5-325 MG PO TABS
1.0000 | ORAL_TABLET | ORAL | Status: DC | PRN
  Administered 2024-02-14 – 2024-02-15 (×5): 2 via ORAL
  Filled 2024-02-14 (×5): qty 2

## 2024-02-14 MED ORDER — ACETAMINOPHEN 500 MG PO TABS
1000.0000 mg | ORAL_TABLET | Freq: Once | ORAL | Status: AC
  Administered 2024-02-14: 1000 mg via ORAL
  Filled 2024-02-14: qty 2

## 2024-02-14 MED ORDER — LISINOPRIL 10 MG PO TABS
10.0000 mg | ORAL_TABLET | Freq: Every day | ORAL | Status: DC
  Administered 2024-02-14: 10 mg via ORAL
  Filled 2024-02-14: qty 1

## 2024-02-14 MED ORDER — FENTANYL CITRATE (PF) 250 MCG/5ML IJ SOLN
INTRAMUSCULAR | Status: DC | PRN
  Administered 2024-02-14 (×5): 50 ug via INTRAVENOUS

## 2024-02-14 MED ORDER — ALUM & MAG HYDROXIDE-SIMETH 200-200-20 MG/5ML PO SUSP: 30.0000 mL | Freq: Four times a day (QID) | ORAL | Status: DC | PRN

## 2024-02-14 MED ORDER — OXYCODONE HCL 5 MG PO TABS
ORAL_TABLET | ORAL | Status: AC
  Filled 2024-02-14: qty 1

## 2024-02-14 MED ORDER — ALBUTEROL SULFATE (2.5 MG/3ML) 0.083% IN NEBU: 3.0000 mL | INHALATION_SOLUTION | Freq: Four times a day (QID) | RESPIRATORY_TRACT | Status: DC | PRN

## 2024-02-14 MED ORDER — INSULIN ASPART 100 UNIT/ML IJ SOLN: 0.0000 [IU] | INTRAMUSCULAR | Status: DC | PRN

## 2024-02-14 MED ORDER — ORAL CARE MOUTH RINSE: 15.0000 mL | Freq: Once | OROMUCOSAL | Status: AC

## 2024-02-14 MED ORDER — DROPERIDOL 2.5 MG/ML IJ SOLN: 0.6250 mg | Freq: Once | INTRAMUSCULAR | Status: DC | PRN

## 2024-02-14 MED ORDER — LACTATED RINGERS IV SOLN: INTRAVENOUS | Status: DC

## 2024-02-14 MED ORDER — BUPIVACAINE LIPOSOME 1.3 % IJ SUSP
INTRAMUSCULAR | Status: AC
  Filled 2024-02-14: qty 20

## 2024-02-14 MED ORDER — BISACODYL 5 MG PO TBEC: 5.0000 mg | DELAYED_RELEASE_TABLET | Freq: Every day | ORAL | Status: DC | PRN

## 2024-02-14 MED ORDER — PANTOPRAZOLE SODIUM 40 MG PO TBEC: 40.0000 mg | DELAYED_RELEASE_TABLET | Freq: Every day | ORAL | Status: DC

## 2024-02-14 MED ORDER — LEVOTHYROXINE SODIUM 137 MCG PO TABS
137.0000 ug | ORAL_TABLET | Freq: Every day | ORAL | Status: DC
  Administered 2024-02-15: 137 ug via ORAL
  Filled 2024-02-14: qty 1

## 2024-02-14 MED ORDER — SIMVASTATIN 20 MG PO TABS
40.0000 mg | ORAL_TABLET | Freq: Every day | ORAL | Status: DC
  Administered 2024-02-14: 40 mg via ORAL
  Filled 2024-02-14: qty 2

## 2024-02-14 MED ORDER — THROMBIN 20000 UNITS EX SOLR
CUTANEOUS | Status: AC
  Filled 2024-02-14: qty 20000

## 2024-02-14 MED ORDER — PROPOFOL 10 MG/ML IV BOLUS
INTRAVENOUS | Status: AC
  Filled 2024-02-14: qty 20

## 2024-02-14 MED ORDER — PHENYLEPHRINE HCL-NACL 20-0.9 MG/250ML-% IV SOLN
INTRAVENOUS | Status: DC | PRN
  Administered 2024-02-14 (×2): 25 ug/min via INTRAVENOUS

## 2024-02-14 MED ORDER — HYDROMORPHONE HCL 1 MG/ML IJ SOLN
INTRAMUSCULAR | Status: AC
  Filled 2024-02-14: qty 0.5

## 2024-02-14 MED ORDER — LIDOCAINE 2% (20 MG/ML) 5 ML SYRINGE
INTRAMUSCULAR | Status: DC | PRN
  Administered 2024-02-14: 100 mg via INTRAVENOUS

## 2024-02-14 MED ORDER — FENTANYL CITRATE (PF) 100 MCG/2ML IJ SOLN
INTRAMUSCULAR | Status: AC
  Filled 2024-02-14: qty 2

## 2024-02-14 MED ORDER — CHLORHEXIDINE GLUCONATE CLOTH 2 % EX PADS
6.0000 | MEDICATED_PAD | Freq: Every day | CUTANEOUS | Status: DC
  Administered 2024-02-15: 6 via TOPICAL

## 2024-02-14 MED ORDER — MENTHOL 3 MG MT LOZG: 1.0000 | LOZENGE | OROMUCOSAL | Status: DC | PRN

## 2024-02-14 MED ORDER — ACETAMINOPHEN 10 MG/ML IV SOLN: 1000.0000 mg | Freq: Once | INTRAVENOUS | Status: DC | PRN

## 2024-02-14 MED ORDER — ROCURONIUM BROMIDE 10 MG/ML (PF) SYRINGE
PREFILLED_SYRINGE | INTRAVENOUS | Status: DC | PRN
  Administered 2024-02-14: 60 mg via INTRAVENOUS
  Administered 2024-02-14: 40 mg via INTRAVENOUS
  Administered 2024-02-14 (×2): 20 mg via INTRAVENOUS

## 2024-02-14 MED ORDER — BUPIVACAINE-EPINEPHRINE 0.25% -1:200000 IJ SOLN
INTRAMUSCULAR | Status: DC | PRN
  Administered 2024-02-14: 40 mL via INTRAMUSCULAR

## 2024-02-14 MED ORDER — MORPHINE SULFATE (PF) 2 MG/ML IV SOLN: 2.0000 mg | INTRAVENOUS | Status: DC | PRN

## 2024-02-14 MED ORDER — PROPOFOL 10 MG/ML IV BOLUS
INTRAVENOUS | Status: DC | PRN
  Administered 2024-02-14: 150 mg via INTRAVENOUS

## 2024-02-14 MED ORDER — THROMBIN 20000 UNITS EX KIT: PACK | CUTANEOUS | Status: DC | PRN

## 2024-02-14 MED ORDER — LISINOPRIL-HYDROCHLOROTHIAZIDE 10-12.5 MG PO TABS: 1.0000 | ORAL_TABLET | Freq: Every day | ORAL | Status: DC

## 2024-02-14 MED ORDER — CEFAZOLIN SODIUM-DEXTROSE 2-4 GM/100ML-% IV SOLN
2.0000 g | INTRAVENOUS | Status: AC
  Administered 2024-02-14: 2 g via INTRAVENOUS

## 2024-02-14 MED ORDER — CHLORHEXIDINE GLUCONATE 0.12 % MT SOLN
OROMUCOSAL | Status: AC
  Administered 2024-02-14: 15 mL via OROMUCOSAL
  Filled 2024-02-14: qty 15

## 2024-02-14 MED ORDER — MUPIROCIN 2 % EX OINT
1.0000 | TOPICAL_OINTMENT | Freq: Two times a day (BID) | CUTANEOUS | Status: DC
  Administered 2024-02-14 – 2024-02-15 (×2): 1 via NASAL
  Filled 2024-02-14: qty 22

## 2024-02-14 MED ORDER — HYDROCODONE-ACETAMINOPHEN 5-325 MG PO TABS: 1.0000 | ORAL_TABLET | ORAL | Status: DC | PRN

## 2024-02-14 MED ORDER — FLEET ENEMA RE ENEM: 1.0000 | ENEMA | Freq: Once | RECTAL | Status: DC | PRN

## 2024-02-14 MED ORDER — FENTANYL CITRATE (PF) 100 MCG/2ML IJ SOLN
25.0000 ug | INTRAMUSCULAR | Status: DC | PRN
  Administered 2024-02-14 (×2): 50 ug via INTRAVENOUS

## 2024-02-14 MED ORDER — AMLODIPINE BESYLATE 5 MG PO TABS: 5.0000 mg | ORAL_TABLET | Freq: Every day | ORAL | Status: DC

## 2024-02-14 MED ORDER — CYCLOBENZAPRINE HCL 10 MG PO TABS: 10.0000 mg | ORAL_TABLET | Freq: Every evening | ORAL | Status: DC | PRN

## 2024-02-14 MED ORDER — METHOCARBAMOL 500 MG PO TABS
500.0000 mg | ORAL_TABLET | Freq: Four times a day (QID) | ORAL | Status: DC | PRN
  Administered 2024-02-14 – 2024-02-15 (×3): 500 mg via ORAL
  Filled 2024-02-14 (×3): qty 1

## 2024-02-14 MED ORDER — FENTANYL CITRATE (PF) 250 MCG/5ML IJ SOLN
INTRAMUSCULAR | Status: AC
  Filled 2024-02-14: qty 5

## 2024-02-14 MED ORDER — METHOCARBAMOL 1000 MG/10ML IJ SOLN: 500.0000 mg | Freq: Four times a day (QID) | INTRAMUSCULAR | Status: DC | PRN

## 2024-02-14 MED ORDER — CEFAZOLIN SODIUM-DEXTROSE 2-4 GM/100ML-% IV SOLN
2.0000 g | Freq: Three times a day (TID) | INTRAVENOUS | Status: AC
  Administered 2024-02-14 – 2024-02-15 (×2): 2 g via INTRAVENOUS
  Filled 2024-02-14 (×2): qty 100

## 2024-02-14 MED ORDER — SODIUM CHLORIDE 0.9% FLUSH
3.0000 mL | Freq: Two times a day (BID) | INTRAVENOUS | Status: DC
  Administered 2024-02-14: 3 mL via INTRAVENOUS

## 2024-02-14 MED ORDER — OXYCODONE HCL 5 MG PO TABS
5.0000 mg | ORAL_TABLET | Freq: Once | ORAL | Status: AC | PRN
  Administered 2024-02-14: 5 mg via ORAL

## 2024-02-14 SURGICAL SUPPLY — 73 items
BAG COUNTER SPONGE SURGICOUNT (BAG) ×2 IMPLANT
BENZOIN TINCTURE PRP APPL 2/3 (GAUZE/BANDAGES/DRESSINGS) ×2 IMPLANT
BLADE CLIPPER SURG (BLADE) IMPLANT
BUR PRECISION FLUTE 5.0 (BURR) ×4 IMPLANT
BUR PRESCISION 1.7 ELITE (BURR) ×2 IMPLANT
BUR ROUND PRECISION 4.0 (BURR) IMPLANT
BUR SABER RD CUTTING 3.0 (BURR) IMPLANT
CAGE SABLE 10X22 6-12 0D (Cage) IMPLANT
CANNULA GRAFT BNE VG PRE-FILL (Bone Implant) IMPLANT
CNTNR URN SCR LID CUP LEK RST (MISCELLANEOUS) ×2 IMPLANT
COVER BACK TABLE 60X90IN (DRAPES) ×2 IMPLANT
COVER MAYO STAND STRL (DRAPES) ×4 IMPLANT
COVER SURGICAL LIGHT HANDLE (MISCELLANEOUS) ×2 IMPLANT
DISPENSER GRAFT BNE VG (MISCELLANEOUS) IMPLANT
DRAPE C-ARM 42X72 X-RAY (DRAPES) ×2 IMPLANT
DRAPE C-ARMOR (DRAPES) IMPLANT
DRAPE POUCH INSTRU U-SHP 10X18 (DRAPES) ×2 IMPLANT
DRAPE SURG 17X23 STRL (DRAPES) ×8 IMPLANT
DURAPREP 26ML APPLICATOR (WOUND CARE) ×2 IMPLANT
ELECT CAUTERY BLADE 6.4 (BLADE) ×2 IMPLANT
ELECTRODE BLDE 4.0 EZ CLN MEGD (MISCELLANEOUS) ×2 IMPLANT
ELECTRODE REM PT RTRN 9FT ADLT (ELECTROSURGICAL) ×2 IMPLANT
EVACUATOR SILICONE 100CC (DRAIN) IMPLANT
FILTER STRAW FLUID ASPIR (MISCELLANEOUS) ×2 IMPLANT
GAUZE 4X4 16PLY ~~LOC~~+RFID DBL (SPONGE) ×2 IMPLANT
GAUZE SPONGE 4X4 12PLY STRL (GAUZE/BANDAGES/DRESSINGS) ×2 IMPLANT
GAUZE SPONGE 4X4 12PLY STRL LF (GAUZE/BANDAGES/DRESSINGS) IMPLANT
GLOVE BIO SURGEON STRL SZ 6.5 (GLOVE) ×2 IMPLANT
GLOVE BIO SURGEON STRL SZ8 (GLOVE) ×2 IMPLANT
GLOVE BIOGEL PI IND STRL 7.0 (GLOVE) ×2 IMPLANT
GLOVE BIOGEL PI IND STRL 8 (GLOVE) ×2 IMPLANT
GOWN STRL REUS W/ TWL LRG LVL3 (GOWN DISPOSABLE) ×4 IMPLANT
GOWN STRL REUS W/ TWL XL LVL3 (GOWN DISPOSABLE) ×2 IMPLANT
IV CATH 14GX2 1/4 (CATHETERS) ×2 IMPLANT
KIT BASIN OR (CUSTOM PROCEDURE TRAY) ×2 IMPLANT
KIT POSITIONER JACKSON TABLE (MISCELLANEOUS) ×2 IMPLANT
KIT TURNOVER KIT B (KITS) ×2 IMPLANT
MARKER SKIN DUAL TIP RULER LAB (MISCELLANEOUS) ×4 IMPLANT
NDL 18GX1X1/2 (RX/OR ONLY) (NEEDLE) ×2 IMPLANT
NDL 22X1.5 STRL (OR ONLY) (MISCELLANEOUS) ×4 IMPLANT
NDL HYPO 25GX1X1/2 BEV (NEEDLE) ×2 IMPLANT
NDL SPNL 18GX3.5 QUINCKE PK (NEEDLE) ×4 IMPLANT
PACK LAMINECTOMY ORTHO (CUSTOM PROCEDURE TRAY) ×2 IMPLANT
PACK UNIVERSAL I (CUSTOM PROCEDURE TRAY) ×2 IMPLANT
PAD ARMBOARD POSITIONER FOAM (MISCELLANEOUS) ×4 IMPLANT
PATTIES SURGICAL .5 X.5 (GAUZE/BANDAGES/DRESSINGS) ×2 IMPLANT
PATTIES SURGICAL .5 X1 (DISPOSABLE) IMPLANT
PATTIES SURGICAL .5X1.5 (GAUZE/BANDAGES/DRESSINGS) IMPLANT
PUTTY DBX 1CC DEPUY (Putty) IMPLANT
ROD PRE BENT EXP 40MM (Rod) IMPLANT
ROD PRE BENT EXPEDIUM 35MM (Rod) IMPLANT
SCREW SET SINGLE INNER (Screw) IMPLANT
SCREW VIPER CORT FIX 6.00X30 (Screw) IMPLANT
SOLN 0.9% NACL POUR BTL 1000ML (IV SOLUTION) ×2 IMPLANT
SOLN STERILE WATER BTL 1000 ML (IV SOLUTION) ×2 IMPLANT
SPONGE INTESTINAL PEANUT (DISPOSABLE) ×2 IMPLANT
SPONGE SURGIFOAM ABS GEL 100 (HEMOSTASIS) ×2 IMPLANT
STRIP CLOSURE SKIN 1/2X4 (GAUZE/BANDAGES/DRESSINGS) ×4 IMPLANT
SURGIFLO W/THROMBIN 8M KIT (HEMOSTASIS) IMPLANT
SUT MNCRL AB 4-0 PS2 18 (SUTURE) ×2 IMPLANT
SUT VIC AB 0 CT1 18XCR BRD 8 (SUTURE) ×2 IMPLANT
SUT VIC AB 1 CT1 18XCR BRD 8 (SUTURE) ×2 IMPLANT
SUT VIC AB 2-0 CT2 18 VCP726D (SUTURE) ×2 IMPLANT
SYR 20ML LL LF (SYRINGE) ×4 IMPLANT
SYR BULB IRRIG 60ML STRL (SYRINGE) ×2 IMPLANT
SYR CONTROL 10ML LL (SYRINGE) ×4 IMPLANT
SYR TB 1ML LUER SLIP (SYRINGE) ×2 IMPLANT
TAP EXPEDIUM DL 4.35 (INSTRUMENTS) IMPLANT
TAP EXPEDIUM DL 5.0 (INSTRUMENTS) IMPLANT
TAP EXPEDIUM DL 6.0 (INSTRUMENTS) IMPLANT
TRAY FOLEY MTR SLVR 16FR STAT (SET/KITS/TRAYS/PACK) ×2 IMPLANT
TUBE FUNNEL GL DISP (ORTHOPEDIC DISPOSABLE SUPPLIES) IMPLANT
YANKAUER SUCT BULB TIP NO VENT (SUCTIONS) ×2 IMPLANT

## 2024-02-14 NOTE — Anesthesia Preprocedure Evaluation (Signed)
 Anesthesia Evaluation  Patient identified by MRN, date of birth, ID band Patient awake    Reviewed: Allergy & Precautions, NPO status , Patient's Chart, lab work & pertinent test results  History of Anesthesia Complications Negative for: history of anesthetic complications  Airway Mallampati: II  TM Distance: >3 FB Neck ROM: Full    Dental no notable dental hx. (+) Teeth Intact   Pulmonary neg pulmonary ROS, neg sleep apnea, neg COPD, Patient abstained from smoking.Not current smoker   Pulmonary exam normal breath sounds clear to auscultation       Cardiovascular Exercise Tolerance: Good METShypertension, Pt. on medications (-) CAD and (-) Past MI (-) dysrhythmias  Rhythm:Regular Rate:Normal - Systolic murmurs Hypertensive in preop, denies chest pain/sob/dizziness/blurry vision today   Neuro/Psych negative neurological ROS  negative psych ROS   GI/Hepatic ,GERD  Controlled and Medicated,,(+)     (-) substance abuse    Endo/Other  diabetes, Oral Hypoglycemic AgentsHypothyroidism  Class 3 obesity  Renal/GU negative Renal ROS     Musculoskeletal  (+) Arthritis ,    Abdominal  (+) + obese  Peds  Hematology   Anesthesia Other Findings Past Medical History: No date: Diabetes mellitus without complication (HCC) No date: GERD (gastroesophageal reflux disease)     Comment:  on meds No date: History of kidney stones 09/10/2010: Hyperlipidemia     Comment:  on meds No date: Hypertension     Comment:  on meds No date: Hypothyroidism     Comment:  on meds 08/2010: Normal cardiac stress test     Comment:  had CP-DOE No date: Osteoarthritis     Comment:  generalizd No date: Wears dentures     Comment:  top No date: Wears glasses  Reproductive/Obstetrics                              Anesthesia Physical Anesthesia Plan  ASA: 3  Anesthesia Plan: General   Post-op Pain Management:  Tylenol  PO (pre-op)*   Induction: Intravenous  PONV Risk Score and Plan: 4 or greater and Ondansetron , Dexamethasone , Propofol  infusion and TIVA  Airway Management Planned: Oral ETT  Additional Equipment: None  Intra-op Plan:   Post-operative Plan: Extubation in OR  Informed Consent: I have reviewed the patients History and Physical, chart, labs and discussed the procedure including the risks, benefits and alternatives for the proposed anesthesia with the patient or authorized representative who has indicated his/her understanding and acceptance.     Dental advisory given  Plan Discussed with: CRNA and Surgeon  Anesthesia Plan Comments: (Discussed risks of anesthesia with patient, including PONV, sore throat, lip/dental/eye damage. Rare risks discussed as well, such as cardiorespiratory and neurological sequelae, and allergic reactions. Discussed the role of CRNA in patient's perioperative care. Patient understands. Patient informed about increased incidence of above perioperative risk due to high BMI. Patient understands.  )         Anesthesia Quick Evaluation

## 2024-02-14 NOTE — H&P (Signed)
 PREOPERATIVE H&P  Chief Complaint: Right leg pain  HPI: Kelly Perkins is a 73 y.o. female who presents with ongoing pain in the right leg  MRI reveals severe stenosis at L4-5, in addition to a spondylolisthesis at L4/5  Patient has failed multiple forms of conservative care and continues to have pain (see office notes for additional details regarding the patient's full course of treatment)  Past Medical History:  Diagnosis Date   Diabetes mellitus without complication (HCC)    GERD (gastroesophageal reflux disease)    on meds   History of kidney stones    Hyperlipidemia 09/10/2010   on meds   Hypertension    on meds   Hypothyroidism    on meds   Normal cardiac stress test 08/2010   had CP-DOE   Osteoarthritis    generalizd   Wears dentures    top   Wears glasses    Past Surgical History:  Procedure Laterality Date   ABDOMINAL HYSTERECTOMY  2000   B oophoreectomy, d/t DUB   COLONOSCOPY  2017   JP-MAC-5 yr recall-poor prep-normal   CYSTOSCOPY/URETEROSCOPY/HOLMIUM LASER/STENT PLACEMENT Right 11/17/2017   Procedure: CYSTOSCOPY/RIGHT URETEROSCOPY/RIGHT HOLMIUM LASER/STENT PLACEMENT;  Surgeon: Devere Lonni Righter, MD;  Location: [facility];  Service: Urology;  Laterality: Right;   JOINT REPLACEMENT     LITHOTRIPSY  2004   urolthiasis    MASS EXCISION  09/12/2011   Procedure: EXCISION MASS;  Surgeon: Elna Pick, MD;  Location: [facility];  Service: Plastics;  Laterality: Left;  left ring finger   POLYPECTOMY     TA 2004   THYROIDECTOMY Left 2006   TOTAL KNEE ARTHROPLASTY Left 11/17/2016   Procedure: LEFT TOTAL KNEE ARTHROPLASTY;  Surgeon: Jerri Kay HERO, MD;  Location: [facility];  Service: Orthopedics;  Laterality: Left;   TOTAL KNEE ARTHROPLASTY Right 12/04/2017   Procedure: RIGHT TOTAL KNEE ARTHROPLASTY;  Surgeon: Jerri Kay HERO, MD;  Location: [facility];  Service: Orthopedics;  Laterality: Right;   Social History   Socioeconomic History    Marital status: Divorced    Spouse name: Not on file   Number of children: 2   Years of education: Not on file   Highest education level: Associate degree: occupational, scientist, product/process development, or vocational program  Occupational History   Occupation: school bus driver. Retired-- Scientist, Research (physical Sciences)  Tobacco Use   Smoking status: Never   Smokeless tobacco: Never  Vaping Use   Vaping status: Never Used  Substance and Sexual Activity   Alcohol  use: Not Currently    Alcohol /week: 1.0 standard drink of alcohol     Types: 1 Standard drinks or equivalent per week   Drug use: Never   Sexual activity: Not Currently  Other Topics Concern   Not on file  Social History Narrative   Single, mom was one of my patients Ms. Ronal Cook.     lives by herself      Right Handed    Lives in a two story home   Lives alone    Social Drivers of Health   Financial Resource Strain: Low Risk  (12/26/2023)   Overall Financial Resource Strain (CARDIA)    Difficulty of Paying Living Expenses: Not hard at all  Food Insecurity: No Food Insecurity (12/26/2023)   Hunger Vital Sign    Worried About Running Out of Food in the Last Year: Never true    Ran Out of Food in the Last Year: Never true  Transportation Needs: No Transportation Needs (12/26/2023)  PRAPARE - Administrator, Civil Service (Medical): No    Lack of Transportation (Non-Medical): No  Physical Activity: Inactive (12/26/2023)   Exercise Vital Sign    Days of Exercise per Week: 0 days    Minutes of Exercise per Session: Not on file  Stress: No Stress Concern Present (12/26/2023)   [employer] of Occupational Health - Occupational Stress Questionnaire    Feeling of Stress: Not at all  Social Connections: Moderately Integrated (12/26/2023)   Social Connection and Isolation Panel    Frequency of Communication with Friends and Family: More than three times a week    Frequency of Social Gatherings with Friends and Family: Once a week    Attends  Religious Services: 1 to 4 times per year    Active Member of [employer] or Organizations: Yes    Attends Engineer, Structural: More than 4 times per year    Marital Status: Divorced  Recent Concern: Social Connections - Socially Isolated (10/09/2023)   Social Connection and Isolation Panel    Frequency of Communication with Friends and Family: Once a week    Frequency of Social Gatherings with Friends and Family: Twice a week    Attends Religious Services: Patient declined    Database Administrator or Organizations: No    Attends Engineer, Structural: Not on file    Marital Status: Divorced   Family History  Problem Relation Age of Onset   Hypertension Mother    Coronary artery disease Mother        onset?   Hypertension Sister    Diabetes Neg Hx    Colon cancer Neg Hx    Breast cancer Neg Hx    Stroke Neg Hx    Rectal cancer Neg Hx    Stomach cancer Neg Hx    Colon polyps Neg Hx    Allergies  Allergen Reactions   Gabapentin      Swelling, breathing issues, diarrhea    Hydrocodone  Bitartrate Er     Itchy-rash mild   Prior to Admission medications   Medication Sig Start Date End Date Taking? Authorizing Provider  celecoxib  (CELEBREX ) 200 MG capsule Take 1 capsule (200 mg total) by mouth daily as needed. Patient taking differently: Take 200 mg by mouth daily. 10/23/23  Yes Paz, Aloysius BRAVO, MD  cyclobenzaprine  (FLEXERIL ) 10 MG tablet Take 1 tablet (10 mg total) by mouth at bedtime as needed for muscle spasms. 09/04/23  Yes Amon Aloysius BRAVO, MD  levothyroxine  (SYNTHROID ) 137 MCG tablet Take 1 tablet (137 mcg total) by mouth daily before breakfast. 01/01/24  Yes Paz, Jose E, MD  lisinopril -hydrochlorothiazide  (ZESTORETIC ) 10-12.5 MG tablet Take 1 tablet by mouth daily. 12/25/23  Yes Paz, Aloysius BRAVO, MD  metFORMIN  (GLUCOPHAGE -XR) 500 MG 24 hr tablet Take 1 tablet by mouth daily with breakfast for 1 week, then 2 tablets by mouth daily with breakfast Patient taking differently: Take  1,000 mg by mouth daily with breakfast. 12/28/23  Yes Paz, Jose E, MD  pantoprazole  (PROTONIX ) 40 MG tablet Take 1 tablet (40 mg total) by mouth daily. 12/25/23  Yes Paz, Aloysius BRAVO, MD  simvastatin  (ZOCOR ) 40 MG tablet Take 1 tablet (40 mg total) by mouth at bedtime. 12/25/23  Yes Paz, Aloysius BRAVO, MD  traMADol  (ULTRAM ) 50 MG tablet Take 50 mg by mouth every 6 (six) hours as needed for moderate pain (pain score 4-6).   Yes [provider]  zolpidem  (AMBIEN ) 10 MG tablet TAKE 1  TABLET BY MOUTH EVERY DAY AT BEDTIME AS NEEDED FOR SLEEP 02/06/24  Yes Webb, Padonda B, FNP  albuterol  (VENTOLIN  HFA) 108 (90 Base) MCG/ACT inhaler Inhale 2 puffs into the lungs every 6 (six) hours as needed for wheezing or shortness of breath. 11/14/23   Amon Aloysius BRAVO, MD  amLODipine  (NORVASC ) 5 MG tablet TAKE 1 TABLET (5 MG TOTAL) BY MOUTH DAILY. Patient not taking: Reported on 02/05/2024 11/19/23   Webb, Padonda B, FNP  budesonide -formoterol  (SYMBICORT ) 160-4.5 MCG/ACT inhaler Inhale 2 puffs into the lungs 2 (two) times daily. Patient taking differently: Inhale 2 puffs into the lungs 2 (two) times daily as needed (Shortness of breath). 11/14/23   Amon Aloysius BRAVO, MD  pregabalin  (LYRICA ) 75 MG capsule Take 1 capsule (75 mg total) by mouth 2 (two) times daily. Patient not taking: No sig reported 11/01/23 12/31/23  Georgina Ozell LABOR, MD     All other systems have been reviewed and were otherwise negative with the exception of those mentioned in the HPI and as above.  Physical Exam: Vitals:   02/14/24 0912 02/14/24 0915  BP:  (!) 181/84  Pulse: 97   Resp: 20   Temp: 98.2 F (36.8 C)   SpO2:  95%    Body mass index is 45.35 kg/m.  General: Alert, no acute distress Cardiovascular: No pedal edema Respiratory: No cyanosis, no use of accessory musculature Skin: No lesions in the area of chief complaint Neurologic: Sensation intact distally Psychiatric: Patient is competent for consent with normal mood and affect Lymphatic: No  axillary or cervical lymphadenopathy   Assessment/Plan: LUMBAR RADICULOPATHY secondary to L4-5 spinal stenosis and instability Plan for Procedure(s): TRANSFORAMINAL LUMBAR INTERBODY FUSION (TLIF) WITH PEDICLE SCREW FIXATION 1 LEVEL   Oneil LITTIE Priestly, MD 02/14/2024 11:28 AM

## 2024-02-14 NOTE — Op Note (Signed)
 PATIENT NAME: Kelly Perkins   MEDICAL RECORD NO.:   [id number]   DATE OF BIRTH: 1951/01/31   DATE OF PROCEDURE: 02/14/2024                               OPERATIVE REPORT     PREOPERATIVE DIAGNOSES: 1. Right-sided lumbar radiculopathy 2. L4-5 spinal stenosis 3. L4-5 spondylolisthesis   POSTOPERATIVE DIAGNOSES: 1. Right-sided lumbar radiculopathy 2. L4-5 spinal stenosis 3. L4-5 spondylolisthesis   PROCEDURES: 1. L4/5 decompression 2. Right-sided L4-5 transforaminal lumbar interbody fusion 3. Left-sided L4-5 posterolateral fusion 4. Insertion of interbody device x1 (Globus expandable intervertebral spacer) 5. Placement of segmental posterior instrumentation L4, L5 bilaterally  6. Use of local autograft 7. Use of morselized allograft - Vivigen 8. Intraoperative use of fluoroscopy   SURGEON:  Oneil Priestly, MD   ASSISTANT:  Ileana Clara, PA-C   ANESTHESIA:  General endotracheal anesthesia.   COMPLICATIONS:  None   DISPOSITION:  Stable   ESTIMATED BLOOD LOSS:  100cc   INDICATIONS FOR SURGERY:  Briefly,  Kelly Perkins is a pleasant 73 year old female who did present to me with severe and ongoing pain in the right leg. I did feel that the symptoms were secondary to the findings noted above.  The patient failed conservative care and did wish to proceed with the procedure  noted above.   OPERATIVE DETAILS:  On 02/14/2024, the patient was brought to surgery and general endotracheal anesthesia was administered.  The patient was placed prone on a well-padded flat Jackson bed with a spinal frame.  Antibiotics were given and a time-out procedure was performed. The back was prepped and draped in the usual fashion.  A midline incision was made overlying the L4-5 intervertebral spaces.  The fascia was incised at the midline.  The paraspinal musculature was bluntly swept laterally.  Anatomic landmarks for the pedicles were exposed. Using fluoroscopy, I did cannulate the L4 and L5  pedicles bilaterally, using a medial to lateral cortical trajectory technique.  I then turned my attention toward the posterior fusion portion of the procedure.  Using a high-speed bur, the left L4-5 facet joint and posterolateral gutter was decorticated to cancellous bone. At this point, 6 x 30 mm screws were placed into the left pedicles, and a 40 mm rod was placed into the tulip heads of the screw, and caps were also placed.  Distraction was then applied across the L4-5 intervertebral space, and the caps were then provisionally tightened.  On the right side, bone wax was placed into the cannulated pedicle holes.  I then proceeded with the decompressive aspect of the procedure at the L4-5 level.  A partial facetectomy was performed bilaterally at L4-5, decompressing the L4-5 intervertebral space.  I was very pleased with the decompression. With an assistant holding medial retraction of the traversing right L5 nerve, I did perform an annulotomy at the posterolateral aspect of the L4-5 intervertebral space.  I then used a series of curettes and pituitary rongeurs to perform a thorough and complete intervertebral diskectomy.  The intervertebral space was then liberally packed with autograft as well as allograft in the form of Vivigen, as was the appropriate-sized intervertebral spacer.  The spacer was then tamped into position in the usual fashion, and expanded to 11.2 mm in height. I was very pleased with the press-fit of the spacer.  Additional autograft and allograft was placed  on the left side, across the L4-5 facet joint  and gutter. I then placed 6 mm screws on the right at L4 and L5. A 40-mm rod was then placed and caps were placed. The distraction was then released on the contralateral side.  All caps were then locked.  The wound was copiously irrigated with a total of approximately 3 L prior to placing the bone graft.  Additional autograft and allograft was then packed into the posterolateral gutter on  the left side to help aid in the success of the fusion.  The wound was  explored for any undue bleeding and there was no substantial bleeding encountered.  Gel-Foam was placed over the laminectomy site.  The wound was then closed in layers using #1 Vicryl followed by 2-0 Vicryl, followed by 4-0 Monocryl.  Benzoin and Steri-Strips were applied followed by sterile dressing.     Of note, Ileana Clara was my assistant throughout surgery, and did aid in retraction, suctioning, the decompression, placement of the hardware, and closure.     Oneil Priestly, MD

## 2024-02-14 NOTE — Progress Notes (Signed)
 Reported BP to Dr. Boone. No new orders at this time.   Joesph LOISE Stake, RN

## 2024-02-14 NOTE — Transfer of Care (Signed)
 Immediate Anesthesia Transfer of Care Note  Patient: Kelly Perkins  Procedure(s) Performed: RIGHT SIDED LUMBAR FOUR- LUMBAR FIVE TRANSFORAMINAL LUMBAR INTERBODY FUSION AND DECOMPRESSION WITH INSTRUMENTATION WITH ALLOGRAFT (Right: Spine Lumbar)  Patient Location: PACU  Anesthesia Type:General  Level of Consciousness: awake, alert , and oriented  Airway & Oxygen Therapy: Patient Spontanous Breathing and Patient connected to face mask oxygen  Post-op Assessment: Report given to RN, Post -op Vital signs reviewed and stable, and Patient moving all extremities X 4  Post vital signs: Reviewed and stable  Last Vitals:  Vitals Value Taken Time  BP    Temp    Pulse    Resp    SpO2      Last Pain:  Vitals:   02/14/24 0939  TempSrc:   PainSc: 6       Patients Stated Pain Goal: 1 (02/14/24 0929)  Complications: No notable events documented.

## 2024-02-14 NOTE — Anesthesia Postprocedure Evaluation (Signed)
 Anesthesia Post Note  Patient: Kelly Perkins  Procedure(s) Performed: RIGHT SIDED LUMBAR FOUR- LUMBAR FIVE TRANSFORAMINAL LUMBAR INTERBODY FUSION AND DECOMPRESSION WITH INSTRUMENTATION WITH ALLOGRAFT (Right: Spine Lumbar)     Patient location during evaluation: PACU Anesthesia Type: General Level of consciousness: awake and alert, oriented and patient cooperative Pain management: pain level controlled Vital Signs Assessment: post-procedure vital signs reviewed and stable Respiratory status: spontaneous breathing, nonlabored ventilation and respiratory function stable Cardiovascular status: blood pressure returned to baseline and stable Postop Assessment: no apparent nausea or vomiting Anesthetic complications: no   No notable events documented.  Last Vitals:  Vitals:   02/14/24 1600 02/14/24 1615  BP: 122/83 130/68  Pulse: 80 85  Resp: 16 17  Temp:    SpO2:      Last Pain:  Vitals:   02/14/24 1615  TempSrc:   PainSc: Asleep   Pain Goal: Patients Stated Pain Goal: 1 (02/14/24 0929)  LLE Motor Response: Purposeful movement (02/14/24 1615) LLE Sensation: Full sensation (02/14/24 1615) RLE Motor Response: Purposeful movement (02/14/24 1615) RLE Sensation: Full sensation (02/14/24 1615)        [name]

## 2024-02-14 NOTE — Anesthesia Procedure Notes (Addendum)
 Procedure Name: Intubation Date/Time: 02/14/2024 12:28 PM  Performed by: Jolynn Mage, CRNAPre-anesthesia Checklist: Patient identified, Patient being monitored, Timeout performed, Emergency Drugs available and Suction available Patient Re-evaluated:Patient Re-evaluated prior to induction Oxygen Delivery Method: Circle System Utilized Preoxygenation: Pre-oxygenation with 100% oxygen Induction Type: IV induction Ventilation: Mask ventilation without difficulty and Oral airway inserted - appropriate to patient size Laryngoscope Size: Cleotilde and 2 Grade View: Grade I Tube type: Oral Tube size: 6.5 mm Number of attempts: 1 Airway Equipment and Method: Stylet Placement Confirmation: ETT inserted through vocal cords under direct vision, positive ETCO2 and breath sounds checked- equal and bilateral Secured at: 21 cm Tube secured with: Tape Dental Injury: Teeth and Oropharynx as per pre-operative assessment

## 2024-02-15 LAB — GLUCOSE, CAPILLARY
Glucose-Capillary: 114 mg/dL — ABNORMAL HIGH (ref 70–99)
Glucose-Capillary: 109 mg/dL — ABNORMAL HIGH (ref 70–99)

## 2024-02-15 MED ORDER — METHOCARBAMOL 500 MG PO TABS: 500.0000 mg | ORAL_TABLET | Freq: Four times a day (QID) | ORAL | 2 refills | Status: AC | PRN

## 2024-02-15 MED ORDER — OXYCODONE-ACETAMINOPHEN 5-325 MG PO TABS: 1.0000 | ORAL_TABLET | ORAL | 0 refills | Status: AC | PRN

## 2024-02-15 MED FILL — Thrombin For Soln 20000 Unit: CUTANEOUS | Qty: 1 | Status: AC

## 2024-02-15 NOTE — Progress Notes (Signed)
 Patient doing well  Patient has been ambulating   Physical Exam: Vitals:   02/14/24 2212 02/15/24 0343  BP: (!) 153/76 (!) 119/59  Pulse: 72 89  Resp: 18 20  Temp: 98.7 F (37.1 C) 98.7 F (37.1 C)  SpO2: 93% 100%    Dressing in place NVI  POD #1 s/p L4/5 decompression and fusion, doing well  - up with PT/OT, encourage ambulation - Percocet for pain, Robaxin  for muscle spasms - d/c home today with f/u in 2 weeks

## 2024-02-15 NOTE — Progress Notes (Signed)
 Patient alert and oriented, ambulated, voided. Surgical site clean and dry no sign of infection. D/c instructions explain and given, all questions answered. Patient d/c home per order.

## 2024-02-15 NOTE — Evaluation (Signed)
 Physical Therapy Evaluation  Patient Details Name: Kelly Perkins MRN: [id number] DOB: 1950-11-12 Today's Date: 02/15/2024  History of Present Illness  Pt is a 73 y.o female admitted 12/03 for scheduled TLIF L4-5 & L4-5 decompression.  PMH: DM, HTN, hypothyroidism, OA.  Clinical Impression  Pt admitted with above diagnosis. At the time of PT eval, pt was able to demonstrate transfers and ambulation with gross CGA to supervision for safety and RW for support. Pt was educated on precautions, brace application/wearing schedule, appropriate activity progression, and car transfer. Pt currently with functional limitations due to the deficits listed below (see PT Problem List). Pt will benefit from skilled PT to increase their independence and safety with mobility to allow discharge to the venue listed below.          If plan is discharge home, recommend the following: A little help with walking and/or transfers;A little help with bathing/dressing/bathroom;Assistance with cooking/housework;Assist for transportation;Help with stairs or ramp for entrance   Can travel by private vehicle        Equipment Recommendations Rolling walker (2 wheels)  Recommendations for Other Services       Functional Status Assessment Patient has had a recent decline in their functional status and demonstrates the ability to make significant improvements in function in a reasonable and predictable amount of time.     Precautions / Restrictions Precautions Precautions: Fall;Back Precaution Booklet Issued: Yes (comment) Recall of Precautions/Restrictions: Intact Precaution/Restrictions Comments: Reviewed handout and pt was cued for precautions during functional mobility. Required Braces or Orthoses: Spinal Brace Spinal Brace: Thoracolumbosacral orthotic;Applied in sitting position Restrictions Weight Bearing Restrictions Per Provider Order: No      Mobility  Bed Mobility Overal bed mobility: Needs  Assistance Bed Mobility: Rolling, Sit to Sidelying Rolling: Supervision       Sit to sidelying: Supervision General bed mobility comments: Heavy use of rails to roll and transition into sitting EOB.    Transfers Overall transfer level: Needs assistance Equipment used: Rolling walker (2 wheels) Transfers: Sit to/from Stand Sit to Stand: Supervision           General transfer comment: Increased time required but pt demonstrated proper hand placement on seated surface for safety. Supervision for safety.    Ambulation/Gait Ambulation/Gait assistance: Contact guard assist Gait Distance (Feet): 60 Feet Assistive device: Rolling walker (2 wheels) Gait Pattern/deviations: Step-through pattern, Decreased stride length, Trunk flexed, Wide base of support Gait velocity: Decreased Gait velocity interpretation: <1.31 ft/sec, indicative of household ambulator   General Gait Details: Slow and guarded due to pain. Heavy reliance on RW for support.  Stairs Stairs: Yes Stairs assistance: Contact guard assist Stair Management: Two rails, Step to pattern, Forwards Number of Stairs: 3 General stair comments: VC's for sequencing and general safety.  Wheelchair Mobility     Tilt Bed    Modified Rankin (Stroke Patients Only)       Balance Overall balance assessment: Needs assistance Sitting-balance support: No upper extremity supported, Feet supported Sitting balance-Leahy Scale: Good     Standing balance support: No upper extremity supported, During functional activity Standing balance-Leahy Scale: Fair Standing balance comment: benefits from BUE support                             Pertinent Vitals/Pain Pain Assessment Pain Assessment: Faces Faces Pain Scale: Hurts whole lot Pain Location: back Pain Descriptors / Indicators: Operative site guarding, Sore, Moaning    Home Living Family/patient  expects to be discharged to:: Private residence Living  Arrangements: Alone Available Help at Discharge: Family;Available PRN/intermittently Type of Home: House Home Access: Stairs to enter Entrance Stairs-Rails: None Entrance Stairs-Number of Steps: 1 Alternate Level Stairs-Number of Steps: flight Home Layout: Multi-level;Bed/bath upstairs Home Equipment: Cane - single point;Shower seat;Adaptive equipment;Other (comment) (Adjustable bed, bed rail)      Prior Function Prior Level of Function : Independent/Modified Independent;Driving;Working/employed             Mobility Comments: Ind, no AD. Furniture walking occassionally in house ADLs Comments: Ind, works as a Orthoptist Extremity Assessment: Overall WFL for tasks assessed    Lower Extremity Assessment Lower Extremity Assessment: Generalized weakness (Consistent with pre-op diagnosis)    Cervical / Trunk Assessment Cervical / Trunk Assessment: Back Surgery  Communication   Communication Communication: No apparent difficulties    Cognition Arousal: Alert Behavior During Therapy: WFL for tasks assessed/performed                             Following commands: Intact       Cueing Cueing Techniques: Verbal cues     General Comments      Exercises     Assessment/Plan    PT Assessment Patient needs continued PT services  PT Problem List Decreased strength;Decreased activity tolerance;Decreased balance;Decreased mobility;Decreased knowledge of use of DME;Decreased knowledge of precautions;Decreased safety awareness;Pain       PT Treatment Interventions DME instruction;Gait training;Stair training;Functional mobility training;Therapeutic activities;Therapeutic exercise;Balance training;Patient/family education    PT Goals (Current goals can be found in the Care Plan section)  Acute Rehab PT Goals Patient Stated Goal: Home today PT Goal Formulation: With patient Time For Goal  Achievement: 02/22/24 Potential to Achieve Goals: Good    Frequency Min 5X/week     Co-evaluation               AM-PAC PT 6 Clicks Mobility  Outcome Measure Help needed turning from your back to your side while in a flat bed without using bedrails?: A Little Help needed moving from lying on your back to sitting on the side of a flat bed without using bedrails?: A Little Help needed moving to and from a bed to a chair (including a wheelchair)?: A Little Help needed standing up from a chair using your arms (e.g., wheelchair or bedside chair)?: A Little Help needed to walk in hospital room?: A Little Help needed climbing 3-5 steps with a railing? : A Little 6 Click Score: 18    End of Session Equipment Utilized During Treatment: Gait belt;Back brace Activity Tolerance: Patient limited by pain Patient left: in bed;with call bell/phone within reach Nurse Communication: Mobility status PT Visit Diagnosis: Unsteadiness on feet (R26.81);Pain Pain - part of body:  (back)    Time: 8854-8779 PT Time Calculation (min) (ACUTE ONLY): 35 min   Charges:   PT Evaluation $PT Eval Low Complexity: 1 Low PT Treatments $Gait Training: 8-22 mins PT General Charges $$ ACUTE PT VISIT: 1 Visit         Leita Sable, PT, DPT Acute Rehabilitation Services Secure Chat Preferred Office: [phone]   Leita JONETTA Sable 02/15/2024, 2:35 PM

## 2024-02-15 NOTE — Evaluation (Signed)
 Occupational Therapy Evaluation Patient Details Name: Kelly Perkins MRN: [id number] DOB: 03-Feb-1951 Today's Date: 02/15/2024   History of Present Illness   Pt is a 73 y.o female admitted 12/03 for scheduled TLIF L4-5 & L4-5 decompression.  PMH: DM, HLD, HTN, hypothyroidism, OA     Clinical Impressions Pt admitted based on above, and was seen based on problem list below. PTA pt was independent with ADLs and IADLs. Today pt is mod I with ADLs with use of compensatory strategies. Educated pt on use of AE such as reacher, long handled sponge, and sock aid for ADLs, pt able to return demo understanding. Pt CGA with RW for functional transfers d/t pain and decreased activity tolerance. Provided and reviewed back educational handout with pt; pt able to verbalize and demo return understanding. No follow up OT needs, will need RW for d/c.  All education complete, no further acute OT needs, OT is signing off on this pt.       If plan is discharge home, recommend the following:   A little help with walking and/or transfers;A little help with bathing/dressing/bathroom;Help with stairs or ramp for entrance     Functional Status Assessment   Patient has had a recent decline in their functional status and demonstrates the ability to make significant improvements in function in a reasonable and predictable amount of time.     Equipment Recommendations   Other (comment) (RW)     Recommendations for Other Services   PT consult     Precautions/Restrictions   Precautions Precautions: Fall;Back Precaution Booklet Issued: Yes (comment) Recall of Precautions/Restrictions: Intact Required Braces or Orthoses: Spinal Brace Spinal Brace: Thoracolumbosacral orthotic Restrictions Weight Bearing Restrictions Per Provider Order: No     Mobility Bed Mobility Overal bed mobility: Needs Assistance Bed Mobility: Rolling, Sit to Sidelying Rolling: Supervision       Sit to sidelying: Min  assist General bed mobility comments: Assist to bring BLEs back into bed    Transfers Overall transfer level: Needs assistance Equipment used: Rolling walker (2 wheels) Transfers: Sit to/from Stand Sit to Stand: Contact guard assist           General transfer comment: CGA for balance, reliant on GB or bed rail      Balance Overall balance assessment: Needs assistance Sitting-balance support: No upper extremity supported, Feet supported Sitting balance-Leahy Scale: Good     Standing balance support: No upper extremity supported, During functional activity Standing balance-Leahy Scale: Fair Standing balance comment: benefits from BUE support     ADL either performed or assessed with clinical judgement   ADL Overall ADL's : Modified independent           General ADL Comments: Demo'd use of compensatory strategies, discussed use of AE prn     Vision Baseline Vision/History: 0 No visual deficits Vision Assessment?: No apparent visual deficits            Pertinent Vitals/Pain Pain Assessment Pain Assessment: Faces Faces Pain Scale: Hurts little more Pain Location: back Pain Descriptors / Indicators: Discomfort, Grimacing Pain Intervention(s): Monitored during session     Extremity/Trunk Assessment Upper Extremity Assessment Upper Extremity Assessment: Overall WFL for tasks assessed   Lower Extremity Assessment Lower Extremity Assessment: Defer to PT evaluation   Cervical / Trunk Assessment Cervical / Trunk Assessment: Back Surgery   Communication Communication Communication: No apparent difficulties   Cognition Arousal: Alert Behavior During Therapy: WFL for tasks assessed/performed Cognition: No apparent impairments     Following commands:  Intact       Cueing  General Comments   Cueing Techniques: Verbal cues              Home Living Family/patient expects to be discharged to:: Private residence Living Arrangements: Alone Available  Help at Discharge: Family;Available PRN/intermittently Type of Home: House Home Access: Stairs to enter Entrance Stairs-Number of Steps: 1 Entrance Stairs-Rails: None Home Layout: Multi-level;Bed/bath upstairs Alternate Level Stairs-Number of Steps: flight Alternate Level Stairs-Rails: Left;Right Bathroom Shower/Tub: Producer, Television/film/video: Handicapped height Bathroom Accessibility: Yes How Accessible: Accessible via walker Home Equipment: Cane - single point;Shower seat;Adaptive equipment;Other (comment) (Adjustable bed, bed rail) Adaptive Equipment: Reacher        Prior Functioning/Environment Prior Level of Function : Independent/Modified Independent;Driving;Working/employed       Mobility Comments: Ind, no AD. Furniture walking occassionally in house ADLs Comments: Ind, works as a Garment/textile Technologist Problem List: Decreased activity tolerance;Impaired balance (sitting and/or standing);Decreased knowledge of precautions;Decreased knowledge of use of DME or AE;Pain        OT Goals(Current goals can be found in the care plan section)   Acute Rehab OT Goals Patient Stated Goal: To go home OT Goal Formulation: All assessment and education complete, DC therapy Time For Goal Achievement: 02/29/24 Potential to Achieve Goals: Good   AM-PAC OT 6 Clicks Daily Activity     Outcome Measure Help from another person eating meals?: None Help from another person taking care of personal grooming?: None Help from another person toileting, which includes using toliet, bedpan, or urinal?: None Help from another person bathing (including washing, rinsing, drying)?: None Help from another person to put on and taking off regular upper body clothing?: None Help from another person to put on and taking off regular lower body clothing?: None 6 Click Score: 24   End of Session Equipment Utilized During Treatment: Rolling walker (2 wheels);Back brace Nurse Communication: Mobility  status  Activity Tolerance: Patient tolerated treatment well Patient left: in bed;with call bell/phone within reach;with nursing/sitter in room  OT Visit Diagnosis: Pain Pain - part of body:  (back)                Time: 9263-9181 OT Time Calculation (min): 42 min Charges:  OT General Charges $OT Visit: 1 Visit OT Evaluation $OT Eval Moderate Complexity: 1 Mod OT Treatments $Self Care/Home Management : 23-37 mins  Adrianne BROCKS, OT  Acute Rehabilitation Services Office [phone] Secure chat preferred   Adrianne GORMAN Savers 02/15/2024, 8:40 AM

## 2024-02-15 NOTE — Care Management Obs Status (Signed)
 MEDICARE OBSERVATION STATUS NOTIFICATION   Patient Details  Name: MARVENE STROHM MRN: [id number] Date of Birth: November 17, 1950   Medicare Observation Status Notification Given:  Yes    Jennie Laneta Dragon 02/15/2024, 10:33 AM

## 2024-02-16 ENCOUNTER — Encounter (HOSPITAL_COMMUNITY): Payer: Self-pay | Admitting: Orthopedic Surgery

## 2024-02-22 NOTE — Discharge Summary (Signed)
 Patient ID: Kelly Perkins MRN: [id number] DOB/AGE: Nov 02, 1950 73 y.o.  Admit date: 02/14/2024 Discharge date: 02/15/2024  Admission Diagnoses:  Principal Problem:   Radiculopathy   Discharge Diagnoses:  Same  Past Medical History:  Diagnosis Date   Diabetes mellitus without complication (HCC)    GERD (gastroesophageal reflux disease)    on meds   History of kidney stones    Hyperlipidemia 09/10/2010   on meds   Hypertension    on meds   Hypothyroidism    on meds   Normal cardiac stress test 08/2010   had CP-DOE   Osteoarthritis    generalizd   Wears dentures    top   Wears glasses     Surgeries: Procedures: RIGHT SIDED LUMBAR FOUR- LUMBAR FIVE TRANSFORAMINAL LUMBAR INTERBODY FUSION AND DECOMPRESSION WITH INSTRUMENTATION WITH ALLOGRAFT on 02/14/2024   Consultants: None  Discharged Condition: Improved  Hospital Course: Kelly Perkins is an 73 y.o. female who was admitted 02/14/2024 for operative treatment of Radiculopathy. Patient has severe unremitting pain that affects sleep, daily activities, and work/hobbies. After pre-op clearance the patient was taken to the operating room on 02/14/2024 and underwent  Procedures: RIGHT SIDED LUMBAR FOUR- LUMBAR FIVE TRANSFORAMINAL LUMBAR INTERBODY FUSION AND DECOMPRESSION WITH INSTRUMENTATION WITH ALLOGRAFT.    Patient was given perioperative antibiotics:  Anti-infectives (From admission, onward)    Start     Dose/Rate Route Frequency Ordered Stop   02/14/24 2000  ceFAZolin  (ANCEF ) IVPB 2g/100 mL premix        2 g 200 mL/hr over 30 Minutes Intravenous Every 8 hours 02/14/24 1747 02/15/24 0750   02/14/24 0930  ceFAZolin  (ANCEF ) IVPB 2g/100 mL premix        2 g 200 mL/hr over 30 Minutes Intravenous On call to O.R. 02/14/24 0920 02/14/24 1310   02/14/24 0923  ceFAZolin  (ANCEF ) 2-4 GM/100ML-% IVPB       Note to Pharmacy: Joli Search N: cabinet override      02/14/24 0923 02/14/24 1310        Patient was given  sequential compression devices, early ambulation to prevent DVT.  Patient benefited maximally from hospital stay and there were no complications.    Recent vital signs: BP (!) 126/56 (BP Location: Left Arm)   Pulse 73   Temp 98.4 F (36.9 C) (Oral)   Resp 17   Ht 5' 3 (1.6 m)   Wt 116.1 kg   SpO2 99%   BMI 45.35 kg/m    Discharge Medications:   Allergies as of 02/15/2024       Reactions   Gabapentin     Swelling, breathing issues, diarrhea    Hydrocodone  Bitartrate Er    Itchy-rash mild        Medication List     STOP taking these medications    traMADol  50 MG tablet Commonly known as: ULTRAM        TAKE these medications    albuterol  108 (90 Base) MCG/ACT inhaler Commonly known as: VENTOLIN  HFA Inhale 2 puffs into the lungs every 6 (six) hours as needed for wheezing or shortness of breath.   amLODipine  5 MG tablet Commonly known as: NORVASC  TAKE 1 TABLET (5 MG TOTAL) BY MOUTH DAILY.   budesonide -formoterol  160-4.5 MCG/ACT inhaler Commonly known as: SYMBICORT  Inhale 2 puffs into the lungs 2 (two) times daily. What changed:  when to take this reasons to take this   cyclobenzaprine  10 MG tablet Commonly known as: FLEXERIL  Take 1 tablet (10 mg total)  by mouth at bedtime as needed for muscle spasms.   levothyroxine  137 MCG tablet Commonly known as: SYNTHROID  Take 1 tablet (137 mcg total) by mouth daily before breakfast.   lisinopril -hydrochlorothiazide  10-12.5 MG tablet Commonly known as: ZESTORETIC  Take 1 tablet by mouth daily.   metFORMIN  500 MG 24 hr tablet Commonly known as: GLUCOPHAGE -XR Take 1 tablet by mouth daily with breakfast for 1 week, then 2 tablets by mouth daily with breakfast What changed:  how much to take how to take this when to take this additional instructions   methocarbamol  500 MG tablet Commonly known as: ROBAXIN  Take 1 tablet (500 mg total) by mouth every 6 (six) hours as needed for muscle spasms.    oxyCODONE -acetaminophen  5-325 MG tablet Commonly known as: PERCOCET/ROXICET Take 1-2 tablets by mouth every 4 (four) hours as needed for severe pain (pain score 7-10).   pantoprazole  40 MG tablet Commonly known as: PROTONIX  Take 1 tablet (40 mg total) by mouth daily.   pregabalin  75 MG capsule Commonly known as: LYRICA  Take 1 capsule (75 mg total) by mouth 2 (two) times daily.   simvastatin  40 MG tablet Commonly known as: ZOCOR  Take 1 tablet (40 mg total) by mouth at bedtime.   zolpidem  10 MG tablet Commonly known as: AMBIEN  TAKE 1 TABLET BY MOUTH EVERY DAY AT BEDTIME AS NEEDED FOR SLEEP        Diagnostic Studies: DG Lumbar Spine 2-3 Views Result Date: 02/14/2024 CLINICAL DATA:  Elective surgery. EXAM: LUMBAR SPINE - 2-3 VIEW COMPARISON:  None available. FINDINGS: Four fluoroscopic spot views of the lumbar spine submitted from the operating room. Posterior rod and pedicle screw fixation at L4-L5 with interbody spacer. Fluoroscopy time 40 seconds. Dose 35.84 mGy. IMPRESSION: Intraoperative fluoroscopy during lumbar fusion. Electronically Signed   By: Andrea Gasman M.D.   On: 02/14/2024 17:12   DG Lumbar Spine 1 View Result Date: 02/14/2024 EXAM: 1 VIEW(S) XRAY OF THE LUMBAR SPINE 02/14/2024 12:50:00 PM COMPARISON: 11/01/2023 CLINICAL HISTORY: Surgery, elective. FINDINGS: LUMBAR SPINE: Posterior needles noted along the L2 and L4 spinous processes. IMPRESSION: 1. Intraoperative localization as above. Electronically signed by: Franky Crease MD 02/14/2024 04:59 PM EST RP Workstation: [id number]   DG C-Arm 1-60 Min-No Report Result Date: 02/14/2024 Fluoroscopy was utilized by the requesting physician.  No radiographic interpretation.   DG C-Arm 1-60 Min-No Report Result Date: 02/14/2024 Fluoroscopy was utilized by the requesting physician.  No radiographic interpretation.    Disposition: Discharge disposition: 01-Home or Self Care        POD #1 s/p L4/5 decompression and  fusion, doing well   - up with PT/OT, encourage ambulation - Percocet for pain, Robaxin  for muscle spasms -Scripts for pain sent to pharmacy electronically  -D/C instructions sheet printed and in chart -D/C today  -F/U in office 2 weeks   Signed: Ileana PARAS Marolyn Urschel 02/22/2024, 10:51 AM

## 2024-04-05 ENCOUNTER — Telehealth: Payer: Self-pay | Admitting: Family

## 2024-04-05 NOTE — Telephone Encounter (Signed)
 Requesting:Ambien  10mg   Contract: 08/05/22 UDS:Ambien  only Last Visit: 12/27/23 Next Visit:04/15/24 Last Refill: 02/06/24 #30 and 1RF   Please Advise

## 2024-04-05 NOTE — Telephone Encounter (Signed)
 PDMP okay, Rx sent

## 2024-04-15 ENCOUNTER — Encounter: Admitting: Internal Medicine

## 2024-04-18 ENCOUNTER — Ambulatory Visit: Admitting: Neurology

## 2024-04-18 ENCOUNTER — Ambulatory Visit: Payer: Self-pay | Admitting: Neurology

## 2024-04-18 DIAGNOSIS — R200 Anesthesia of skin: Secondary | ICD-10-CM | POA: Diagnosis not present

## 2024-04-18 DIAGNOSIS — M5417 Radiculopathy, lumbosacral region: Secondary | ICD-10-CM

## 2024-04-18 DIAGNOSIS — R202 Paresthesia of skin: Secondary | ICD-10-CM | POA: Diagnosis not present

## 2024-04-18 NOTE — Procedures (Signed)
 " [facility]  [address] [place], [address]  [place], [place] [address] Tel: [phone] Fax: [phone] Test Date:  04/18/2024  Patient: Kelly Perkins DOB: 01-Oct-1950 Physician: Tonita Blanch, DO  Sex: Female Height: 5' 3 Ref Phys: Tonita Blanch, DO  ID#: [id number]   Technician:    History: This is a 74 year old female referred for evaluation of bilateral leg pain and paresthesias.  NCV & EMG Findings: Extensive electrodiagnostic testing of the right lower extremity and additional study of the left shows:  Bilateral sural and superficial peroneal sensory responses are within normal limits.  Bilateral tibial motor responses show reduced amplitude (R1.1, L2.6 mV).  Bilateral peroneal motor responses are within normal limits.  Bilateral tibial H reflex studies are within normal limits.  Chronic motor axon loss changes are seen affecting the S1 myotome bilaterally, without accompanying active denervation.   Impression: Chronic S1 radiculopathy affecting bilateral lower extremities, mild-moderate, and worse on the left. There is no evidence of a large fiber sensorimotor polyneuropathy affecting the lower extremities.   ___________________________ Tonita Blanch, DO    Nerve Conduction Studies   Stim Site NR Peak (ms) Norm Peak (ms) O-P Amp (V) Norm O-P Amp  Left Sup Peroneal Anti Sensory (Ant Lat Mall)  32 C  12 cm    1.8 <4.6 16.4 >3  Right Sup Peroneal Anti Sensory (Ant Lat Mall)  32 C  12 cm    1.9 <4.6 16.8 >3  Left Sural Anti Sensory (Lat Mall)  32 C  Calf    1.8 <4.6 12.1 >3  Right Sural Anti Sensory (Lat Mall)  32 C  Calf    1.5 <4.6 11.1 >3     Stim Site NR Onset (ms) Norm Onset (ms) O-P Amp (mV) Norm O-P Amp Site1 Site2 Delta-0 (ms) Dist (cm) Vel (m/s) Norm Vel (m/s)  Left Peroneal Motor (Ext Dig Brev)  32 C  Ankle    2.8 <6.0 3.5 >2.5 B Fib Ankle 6.8 36.0 53 >40  B Fib    9.6  3.2  Poplt B Fib 1.2 7.0 58 >40  Poplt    10.8  3.0         Right Peroneal  Motor (Ext Dig Brev)  32 C  Ankle    2.6 <6.0 2.5 >2.5 B Fib Ankle 7.5 39.0 52 >40  B Fib    10.1  2.4  Poplt B Fib 1.5 8.0 53 >40  Poplt    11.6  2.2         Left Tibial Motor (Abd Hall Brev)  32 C  Ankle    3.5 <6.0 *2.6 >4 Knee Ankle 7.0 39.0 56 >40  Knee    10.5  1.2         Right Tibial Motor (Abd Hall Brev)  32 C  Ankle    3.6 <6.0 *1.1 >4 Knee Ankle 7.3 39.0 53 >40  Knee    10.9  0.6          Electromyography   Side Muscle Ins.Act Fibs Fasc Recrt Amp Dur Poly Activation Comment  Right AntTibialis Nml Nml Nml Nml Nml Nml Nml Nml N/A  Right Gastroc Nml Nml Nml *1- *1+ *1+ *1+ Nml N/A  Right Flex Dig Long Nml Nml Nml Nml Nml Nml Nml Nml N/A  Right RectFemoris Nml Nml Nml Nml Nml Nml Nml Nml N/A  Right BicepsFemS Nml Nml Nml *1- *1+ *1+ *1+ Nml N/A  Right GluteusMed Nml Nml Nml Nml Nml  Nml Nml Nml N/A  Left AntTibialis Nml Nml Nml Nml Nml Nml Nml Nml N/A  Left Gastroc Nml Nml Nml *2- *1+ *1+ *1+ Nml N/A  Left Flex Dig Long Nml Nml Nml Nml Nml Nml Nml Nml N/A  Left RectFemoris Nml Nml Nml Nml Nml Nml Nml Nml N/A  Left GluteusMed Nml Nml Nml Nml Nml Nml Nml Nml N/A  Left BicepsFemS Nml Nml Nml *2- *1+ *1+ *1+ Nml N/A      Waveforms:                      "

## 2024-09-20 ENCOUNTER — Ambulatory Visit
